# Patient Record
Sex: Male | Born: 1945
Health system: Southern US, Community
[De-identification: ages and names within clinical notes are randomized; demographics above are authoritative.]

## PROBLEM LIST (undated history)

## (undated) DIAGNOSIS — R7303 Prediabetes: Secondary | ICD-10-CM

## (undated) DIAGNOSIS — I639 Cerebral infarction, unspecified: Secondary | ICD-10-CM

## (undated) DIAGNOSIS — G4730 Sleep apnea, unspecified: Secondary | ICD-10-CM

## (undated) DIAGNOSIS — I10 Essential (primary) hypertension: Secondary | ICD-10-CM

## (undated) DIAGNOSIS — H9190 Unspecified hearing loss, unspecified ear: Secondary | ICD-10-CM

## (undated) DIAGNOSIS — M1990 Unspecified osteoarthritis, unspecified site: Secondary | ICD-10-CM

## (undated) DIAGNOSIS — F32A Depression, unspecified: Secondary | ICD-10-CM

## (undated) DIAGNOSIS — C801 Malignant (primary) neoplasm, unspecified: Secondary | ICD-10-CM

## (undated) DIAGNOSIS — J189 Pneumonia, unspecified organism: Secondary | ICD-10-CM

## (undated) DIAGNOSIS — Q282 Arteriovenous malformation of cerebral vessels: Secondary | ICD-10-CM

## (undated) DIAGNOSIS — E119 Type 2 diabetes mellitus without complications: Secondary | ICD-10-CM

## (undated) DIAGNOSIS — R0600 Dyspnea, unspecified: Secondary | ICD-10-CM

## (undated) DIAGNOSIS — R569 Unspecified convulsions: Secondary | ICD-10-CM

## (undated) DIAGNOSIS — F419 Anxiety disorder, unspecified: Secondary | ICD-10-CM

## (undated) DIAGNOSIS — E785 Hyperlipidemia, unspecified: Secondary | ICD-10-CM

## (undated) HISTORY — PX: INGUINAL HERNIA REPAIR: SUR1180

## (undated) HISTORY — DX: Type 2 diabetes mellitus without complications: E11.9

## (undated) HISTORY — PX: COLONOSCOPY W/ POLYPECTOMY: SHX1380

## (undated) HISTORY — PX: HAMMER TOE SURGERY: SHX385

## (undated) HISTORY — PX: BACK SURGERY: SHX140

## (undated) NOTE — *Deleted (*Deleted)
patient

---

## 1997-05-05 HISTORY — PX: BACK SURGERY: SHX140

## 2013-05-05 HISTORY — PX: KNEE SURGERY: SHX244

## 2014-08-27 ENCOUNTER — Emergency Department
Admit: 2014-08-27 | Disposition: A | Discharge status: Home / Self Care | Payer: Self-pay | Admitting: Physician Assistant

## 2014-08-27 LAB — BASIC METABOLIC PANEL
ANION GAP: 7 (ref 7–16)
BUN: 14 mg/dL
CHLORIDE: 102 mmol/L
CREATININE: 0.95 mg/dL
Calcium, Total: 8.9 mg/dL
Co2: 32 mmol/L
EGFR (African American): >60
EGFR (Non-African Amer.): >60
Glucose: 112 mg/dL — ABNORMAL HIGH
Potassium: 3 mmol/L — ABNORMAL LOW
Sodium: 141 mmol/L

## 2014-08-27 LAB — CBC
HCT: 42.8 % (ref 40.0–52.0)
HGB: 14.6 g/dL (ref 13.0–18.0)
MCH: 29.4 pg (ref 26.0–34.0)
MCHC: 34 g/dL (ref 32.0–36.0)
MCV: 86 fL (ref 80–100)
PLATELETS: 214 10*3/uL (ref 150–440)
RBC: 4.96 10*6/uL (ref 4.40–5.90)
RDW: 14.5 % (ref 11.5–14.5)
WBC: 4.9 10*3/uL (ref 3.8–10.6)

## 2014-08-27 LAB — TROPONIN I: Troponin-I: <0.03 ng/mL

## 2014-08-27 LAB — PRO B NATRIURETIC PEPTIDE: B-Type Natriuretic Peptide: 169 pg/mL — ABNORMAL HIGH

## 2015-05-06 HISTORY — PX: TOE SURGERY: SHX1073

## 2015-11-03 DIAGNOSIS — I639 Cerebral infarction, unspecified: Secondary | ICD-10-CM

## 2015-11-03 HISTORY — DX: Cerebral infarction, unspecified: I63.9

## 2015-11-13 ENCOUNTER — Inpatient Hospital Stay (HOSPITAL_COMMUNITY): Payer: Medicare HMO

## 2015-11-13 ENCOUNTER — Emergency Department
Admission: EM | Admit: 2015-11-13 | Discharge: 2015-11-13 | Disposition: A | Discharge status: Other Acute Inpatient Hospital | Payer: Medicare HMO | Attending: Emergency Medicine | Admitting: Emergency Medicine

## 2015-11-13 ENCOUNTER — Inpatient Hospital Stay (HOSPITAL_COMMUNITY)
Admission: AD | Admit: 2015-11-13 | Discharge: 2015-11-16 | DRG: 066 | Disposition: A | Discharge status: Home / Self Care | Payer: Medicare HMO | Source: Other Acute Inpatient Hospital | Attending: Neurology | Admitting: Neurology

## 2015-11-13 ENCOUNTER — Emergency Department: Payer: Medicare HMO

## 2015-11-13 ENCOUNTER — Encounter: Payer: Self-pay | Admitting: Emergency Medicine

## 2015-11-13 DIAGNOSIS — Z833 Family history of diabetes mellitus: Secondary | ICD-10-CM

## 2015-11-13 DIAGNOSIS — E876 Hypokalemia: Secondary | ICD-10-CM | POA: Diagnosis not present

## 2015-11-13 DIAGNOSIS — Z8249 Family history of ischemic heart disease and other diseases of the circulatory system: Secondary | ICD-10-CM | POA: Diagnosis not present

## 2015-11-13 DIAGNOSIS — I10 Essential (primary) hypertension: Secondary | ICD-10-CM | POA: Insufficient documentation

## 2015-11-13 DIAGNOSIS — I619 Nontraumatic intracerebral hemorrhage, unspecified: Secondary | ICD-10-CM | POA: Diagnosis not present

## 2015-11-13 DIAGNOSIS — Z7982 Long term (current) use of aspirin: Secondary | ICD-10-CM

## 2015-11-13 DIAGNOSIS — Z79899 Other long term (current) drug therapy: Secondary | ICD-10-CM | POA: Insufficient documentation

## 2015-11-13 DIAGNOSIS — R7303 Prediabetes: Secondary | ICD-10-CM | POA: Diagnosis not present

## 2015-11-13 DIAGNOSIS — E785 Hyperlipidemia, unspecified: Secondary | ICD-10-CM | POA: Diagnosis not present

## 2015-11-13 DIAGNOSIS — I610 Nontraumatic intracerebral hemorrhage in hemisphere, subcortical: Secondary | ICD-10-CM | POA: Diagnosis not present

## 2015-11-13 DIAGNOSIS — I671 Cerebral aneurysm, nonruptured: Secondary | ICD-10-CM | POA: Diagnosis not present

## 2015-11-13 DIAGNOSIS — I608 Other nontraumatic subarachnoid hemorrhage: Secondary | ICD-10-CM | POA: Diagnosis present

## 2015-11-13 DIAGNOSIS — Z6835 Body mass index (BMI) 35.0-35.9, adult: Secondary | ICD-10-CM | POA: Diagnosis not present

## 2015-11-13 DIAGNOSIS — R471 Dysarthria and anarthria: Secondary | ICD-10-CM | POA: Diagnosis not present

## 2015-11-13 DIAGNOSIS — I611 Nontraumatic intracerebral hemorrhage in hemisphere, cortical: Principal | ICD-10-CM | POA: Diagnosis present

## 2015-11-13 DIAGNOSIS — R4701 Aphasia: Secondary | ICD-10-CM | POA: Diagnosis present

## 2015-11-13 DIAGNOSIS — I770 Arteriovenous fistula, acquired: Secondary | ICD-10-CM

## 2015-11-13 DIAGNOSIS — I6789 Other cerebrovascular disease: Secondary | ICD-10-CM | POA: Diagnosis not present

## 2015-11-13 DIAGNOSIS — E669 Obesity, unspecified: Secondary | ICD-10-CM | POA: Diagnosis not present

## 2015-11-13 DIAGNOSIS — Q2730 Arteriovenous malformation, site unspecified: Secondary | ICD-10-CM

## 2015-11-13 DIAGNOSIS — I639 Cerebral infarction, unspecified: Secondary | ICD-10-CM | POA: Diagnosis present

## 2015-11-13 HISTORY — DX: Hyperlipidemia, unspecified: E78.5

## 2015-11-13 HISTORY — DX: Prediabetes: R73.03

## 2015-11-13 HISTORY — DX: Essential (primary) hypertension: I10

## 2015-11-13 LAB — URINALYSIS COMPLETE WITH MICROSCOPIC (ARMC ONLY)
BACTERIA UA: NONE SEEN
Bilirubin Urine: NEGATIVE
Glucose, UA: NEGATIVE mg/dL
KETONES UR: NEGATIVE mg/dL
LEUKOCYTES UA: NEGATIVE
NITRITE: NEGATIVE
PH: 5 (ref 5.0–8.0)
PROTEIN: NEGATIVE mg/dL
SPECIFIC GRAVITY, URINE: 1.017 (ref 1.005–1.030)

## 2015-11-13 LAB — URINE DRUG SCREEN, QUALITATIVE (ARMC ONLY)
AMPHETAMINES, UR SCREEN: NOT DETECTED
Barbiturates, Ur Screen: NOT DETECTED
Benzodiazepine, Ur Scrn: NOT DETECTED
CANNABINOID 50 NG, UR ~~LOC~~: NOT DETECTED
Cocaine Metabolite,Ur ~~LOC~~: NOT DETECTED
MDMA (Ecstasy)Ur Screen: NOT DETECTED
Methadone Scn, Ur: NOT DETECTED
OPIATE, UR SCREEN: NOT DETECTED
Phencyclidine (PCP) Ur S: NOT DETECTED
Tricyclic, Ur Screen: NOT DETECTED

## 2015-11-13 LAB — CBC WITH DIFFERENTIAL/PLATELET
BASOS ABS: 0 10*3/uL (ref 0–0.1)
Basophils Relative: 1 %
Eosinophils Absolute: 0.2 10*3/uL (ref 0–0.7)
Eosinophils Relative: 4 %
HEMATOCRIT: 45.4 % (ref 40.0–52.0)
HEMOGLOBIN: 16 g/dL (ref 13.0–18.0)
LYMPHS PCT: 24 %
Lymphs Abs: 1.4 10*3/uL (ref 1.0–3.6)
MCH: 31.4 pg (ref 26.0–34.0)
MCHC: 35.2 g/dL (ref 32.0–36.0)
MCV: 89 fL (ref 80.0–100.0)
MONO ABS: 0.7 10*3/uL (ref 0.2–1.0)
Monocytes Relative: 12 %
NEUTROS ABS: 3.7 10*3/uL (ref 1.4–6.5)
Neutrophils Relative %: 59 %
Platelets: 205 10*3/uL (ref 150–440)
RBC: 5.1 MIL/uL (ref 4.40–5.90)
RDW: 13.7 % (ref 11.5–14.5)
WBC: 6.1 10*3/uL (ref 3.8–10.6)

## 2015-11-13 LAB — COMPREHENSIVE METABOLIC PANEL
ALT: 25 U/L (ref 17–63)
ANION GAP: 9 (ref 5–15)
AST: 24 U/L (ref 15–41)
Albumin: 4.4 g/dL (ref 3.5–5.0)
Alkaline Phosphatase: 39 U/L (ref 38–126)
BILIRUBIN TOTAL: 0.4 mg/dL (ref 0.3–1.2)
BUN: 20 mg/dL (ref 6–20)
CO2: 25 mmol/L (ref 22–32)
Calcium: 9.4 mg/dL (ref 8.9–10.3)
Chloride: 103 mmol/L (ref 101–111)
Creatinine, Ser: 1.43 mg/dL — ABNORMAL HIGH (ref 0.61–1.24)
GFR calc Af Amer: 56 mL/min — ABNORMAL LOW (ref >60)
GFR calc non Af Amer: 48 mL/min — ABNORMAL LOW (ref >60)
Glucose, Bld: 148 mg/dL — ABNORMAL HIGH (ref 65–99)
POTASSIUM: 3.4 mmol/L — AB (ref 3.5–5.1)
Sodium: 137 mmol/L (ref 135–145)
Total Protein: 7.5 g/dL (ref 6.5–8.1)

## 2015-11-13 LAB — APTT: aPTT: 28 seconds (ref 24–36)

## 2015-11-13 LAB — TROPONIN I: Troponin I: <0.03 ng/mL (ref <0.03)

## 2015-11-13 LAB — PROTIME-INR
INR: 0.93
Prothrombin Time: 12.7 seconds (ref 11.4–15.0)

## 2015-11-13 LAB — MRSA PCR SCREENING: MRSA by PCR: NEGATIVE

## 2015-11-13 MED ORDER — SENNOSIDES-DOCUSATE SODIUM 8.6-50 MG PO TABS: 1.0000 | ORAL_TABLET | Freq: Every evening | ORAL | Status: DC | PRN

## 2015-11-13 MED ORDER — STROKE: EARLY STAGES OF RECOVERY BOOK
Freq: Once | Status: DC
  Filled 2015-11-13: qty 1

## 2015-11-13 MED ORDER — SODIUM CHLORIDE 0.9 % IV SOLN
INTRAVENOUS | Status: DC
  Administered 2015-11-13: 18:00:00 via INTRAVENOUS

## 2015-11-13 MED ORDER — LABETALOL HCL 5 MG/ML IV SOLN: 20.0000 mg | Freq: Once | INTRAVENOUS | Status: DC

## 2015-11-13 MED ORDER — LABETALOL HCL 5 MG/ML IV SOLN
10.0000 mg | Freq: Once | INTRAVENOUS | Status: AC
  Administered 2015-11-13: 10 mg via INTRAVENOUS
  Filled 2015-11-13: qty 4

## 2015-11-13 NOTE — ED Provider Notes (Signed)
[facility] Emergency Department Provider Note   ____________________________________________    I have reviewed the triage vital signs and the nursing notes.   HISTORY  Chief Complaint Aphasia     HPI Timothy Maddox is a 70 y.o. male who presents with difficulty speaking. Patient reports yesterday at approximately 9 PM he noticed the tip of his tongue was known and he had difficulty speaking and difficulty finding words. He continues to have difficulty speaking today, when his daughter came to his house she immediately became concerned and his something was wrong and called EMS. No history of CVA. No head injury. No extremity weakness   Past Medical History  Diagnosis Date  . Hypertension   . Prediabetes   . Hyperlipidemia     There are no active problems to display for this patient.   Past Surgical History  Procedure Laterality Date  . Back surgery    . Toe surgery Left     Current Outpatient Rx  Name  Route  Sig  Dispense  Refill  . amLODipine (NORVASC) 10 MG tablet   Oral   Take 10 mg by mouth daily.         Marland Kitchen aspirin EC 81 MG tablet   Oral   Take 81 mg by mouth daily.         Marland Kitchen losartan-hydrochlorothiazide (HYZAAR) 100-12.5 MG tablet   Oral   Take 1 tablet by mouth daily.         . metoprolol (LOPRESSOR) 100 MG tablet   Oral   Take 100 mg by mouth 2 (two) times daily.         . rosuvastatin (CRESTOR) 10 MG tablet   Oral   Take 10 mg by mouth daily.         Marland Kitchen spironolactone (ALDACTONE) 100 MG tablet   Oral   Take 100 mg by mouth 2 (two) times daily.           Allergies Review of patient's allergies indicates no known allergies.  Family History  Problem Relation Age of Onset  . Heart failure Father   . Diabetes Father   . Hypertension Father     Social History Social History  Substance Use Topics  . Smoking status: Never Smoker   . Smokeless tobacco: None  . Alcohol Use: No    Review of  Systems  Constitutional: No fever/chills Eyes: No visual changes.  ENT: No Neck pain Cardiovascular: Denies chest pain. Respiratory: Denies shortness of breath. Gastrointestinal:  No nausea, no vomiting.   Genitourinary: Negative for continence Musculoskeletal: Negative for back pain. Skin: Negative for rash. Neurological: Negative for extremity weakness  10-point ROS otherwise negative.  ____________________________________________   PHYSICAL EXAM:  VITAL SIGNS: ED Triage Vitals  Enc Vitals Group     BP 11/13/15 1321 154/94 mmHg     Pulse Rate 11/13/15 1321 56     Resp 11/13/15 1321 19     Temp 11/13/15 1321 98.1 F (36.7 C)     Temp Source 11/13/15 1321 Oral     SpO2 11/13/15 1321 96 %     Weight 11/13/15 1321 260 lb (117.935 kg)     Height 11/13/15 1321 6' (1.829 m)     Head Cir --      Peak Flow --      Pain Score --      Pain Loc --      Pain Edu? --  Excl. in [place]? --     Constitutional: Alert and oriented. No acute distress. Eyes: Conjunctivae are normal. PERRLA, EOMI Head: Atraumatic.Normocephalic Nose: No congestion/rhinnorhea. Mouth/Throat: Mucous membranes are moist.  Oropharynx non-erythematous. Neck:  Painless ROM Cardiovascular: Normal rate, regular rhythm. Grossly normal heart sounds.  Good peripheral circulation. Respiratory: Normal respiratory effort.  No retractions. Lungs CTAB. Gastrointestinal: Soft and nontender. No distention.  Genitourinary: deferred Musculoskeletal: No lower extremity tenderness nor edema.  Warm and well perfused, normal strength all extremities Neurologic:  Patient with slurred speech and some difficulty with word finding, extremity strength is normal. NIH stroke scale of 2 on my exam Skin:  Skin is warm, dry and intact. No rash noted. Psychiatric: Mood and affect are normal.  behavior is normal.  ____________________________________________   LABS (all labs ordered are listed, but only abnormal results are  displayed)  Labs Reviewed  COMPREHENSIVE METABOLIC PANEL - Abnormal; Notable for the following:    Potassium 3.4 (*)    Glucose, Bld 148 (*)    Creatinine, Ser 1.43 (*)    GFR calc non Af Amer 48 (*)    GFR calc Af Amer 56 (*)    All other components within normal limits  URINALYSIS COMPLETEWITH MICROSCOPIC ([facility] ONLY) - Abnormal; Notable for the following:    Color, Urine YELLOW (*)    APPearance CLEAR (*)    Hgb urine dipstick 1+ (*)    Squamous Epithelial / LPF 0-5 (*)    All other components within normal limits  APTT  CBC WITH DIFFERENTIAL/PLATELET  TROPONIN I  PROTIME-INR  URINE DRUG SCREEN, QUALITATIVE ([facility] ONLY)   ____________________________________________  EKG  ED ECG REPORT I, Lavonia Drafts, the attending physician, personally viewed and interpreted this ECG.  Date: 11/13/2015 EKG Time: 1:16 PM Rate: 59 Rhythm: normal sinus rhythm QRS Axis: normal Intervals: normal ST/T Wave abnormalities: normal Conduction Disturbances: none   ____________________________________________  RADIOLOGY  Patient with focal area of hemorrhage in the left frontal lobe ____________________________________________   PROCEDURES  Procedure(s) performed: No    Critical Care performed:yes  CRITICAL CARE Performed by: Lavonia Drafts   Total critical care time: 35 minutes  Critical care time was exclusive of separately billable procedures and treating other patients.  Critical care was necessary to treat or prevent imminent or life-threatening deterioration.  Critical care was time spent personally by me on the following activities: development of treatment plan with patient and/or surrogate as well as nursing, discussions with consultants, evaluation of patient's response to treatment, examination of patient, obtaining history from patient or surrogate, ordering and performing treatments and interventions, ordering and review of laboratory studies, ordering and  review of radiographic studies, pulse oximetry and re-evaluation of patient's condition.  ____________________________________________   INITIAL IMPRESSION / ASSESSMENT AND PLAN / ED COURSE  Pertinent labs & imaging results that were available during my care of the patient were reviewed by me and considered in my medical decision making (see chart for details).  Patient presents with slurred speech which apparently started at 9 PM last night. He does not fall within the window for a code stroke but certainly after his exam and history of present illness and concerned about a CVA, we will obtain CT scan labs ASAP. NIH stroke scale of 2  ----------------------------------------- 2:46 PM on 11/13/2015 -----------------------------------------  Called by radiologist and notified of likely hemorrhagic infarct on CT scan. Discussed with Dr. Gifford Shave at [facility] who accepts the patient in transfer. Patient and family are aware of the  plan  I will give a small dose of labetalol to gently lower the patient's blood pressure. Patient stable at this time with no evidence of increased ICP ____________________________________________   FINAL CLINICAL IMPRESSION(S) / ED DIAGNOSES  Final diagnoses:  Hemorrhagic stroke ([place])      NEW MEDICATIONS STARTED DURING THIS VISIT:  New Prescriptions   No medications on file     Note:  This document was prepared using Dragon voice recognition software and may include unintentional dictation errors.    Lavonia Drafts, MD 11/13/15 [phone]

## 2015-11-13 NOTE — ED Notes (Signed)
Signature pad not working.  Pt verbalized consent for transfer to [facility] and has no further questions.

## 2015-11-13 NOTE — ED Notes (Signed)
Pt to ED via EMS from home c/o speech difficulty.  Pt states yesterday noticed tip of tongue became numb and developed trouble speaking and trouble finding words.  Pt states today tongue numbness has resolved.  Pt denies any other symptoms.  Pt is A&Ox4, ambulatory to room with steady gait and NAD at this time.  EMS vitals 132/72, 78 HR, 160 CBG, 98% RA.

## 2015-11-13 NOTE — Consult Note (Signed)
Requesting Physician: Dr. Corky Downs    Chief Complaint: stroke  History obtained from:  Patient   HPI:                                                                                                                                         Timothy Maddox is an 70 y.o. male presenting to [facility] ED with speech difficulties.  He states he developed numbness in his mouth and garbled speech last evening.  His family states they initially were unable to understand anything he said but it cleared somewhat so they could understand some words.  He states he initially had difficulty finding his words but that resolved.  He did complain of a headache with the onset of symptoms.  He refused to be evaluated at that time.  This am his daughter called to speak with him and found him to have garbled speech again and insisted he be evaluated.  He states his speech has cleared and his family states he has made significant improvement since earlier this morning.  He has a history of HTN and HLD for which he takes his medicines regularly.  Date last known well: 11/12/2015 Time last known well: Time: 21:00 tPA Given: No: outside window, contraindicated No Symptoms         0 No significant disability/able to carry out all usual activities   1 Unable to carry out all previous activities but looks after own affairs             2 Requires help but walks without assistance     3 Unable to walk without assistance/unable to handle own bodily needs 4 Bedridden/incontinent        5 Dead          6  Modified Rankin: Rankin Score=0  Intracerebral Hemorrhage (ICH) Score  Glascow Coma Score   0  Age >/= 80 yes no 0  ICH volume >/= 60ml no 0  IVH yes no 0  Infratentorial origin no 0 Total:  0  Past Medical History  Diagnosis Date  . Hypertension   . Prediabetes   . Hyperlipidemia     Past Surgical History  Procedure Laterality Date  . Back surgery    . Toe surgery Left     Family History  Problem  Relation Age of Onset  . Heart failure Father   . Diabetes Father   . Hypertension Father    Social History:  reports that he has never smoked. He does not have any smokeless tobacco history on file. He reports that he does not drink alcohol or use illicit drugs.  Allergies: No Known Allergies  Medications:  Current Facility-Administered Medications  Medication Dose Route Frequency Provider Last Rate Last Dose  .  stroke: mapping our early stages of recovery book   Does not apply Once Zulfiqar Robyne Askew, MD      . 0.9 %  sodium chloride infusion   Intravenous Continuous Zulfiqar Robyne Askew, MD      . senna-docusate (Senokot-S) tablet 1 tablet  1 tablet Oral QHS PRN Zulfiqar Robyne Askew, MD      }  ROS:                                                                                                                                       History obtained from the patient  General ROS: negative for - chills, fatigue, fever, night sweats, weight gain or weight loss Psychological ROS: negative for - behavioral disorder, hallucinations, memory difficulties, mood swings or suicidal ideation Ophthalmic ROS: negative for - blurry vision, double vision, eye pain or loss of vision ENT ROS: negative for - epistaxis, nasal discharge, oral lesions, sore throat, tinnitus or vertigo Allergy and Immunology ROS: negative for - hives or itchy/watery eyes Hematological and Lymphatic ROS: negative for - bleeding problems, bruising or swollen lymph nodes Endocrine ROS: negative for - galactorrhea, hair pattern changes, polydipsia/polyuria or temperature intolerance Respiratory ROS: negative for - cough, hemoptysis, shortness of breath or wheezing Cardiovascular ROS: negative for - chest pain, dyspnea on exertion, edema or irregular heartbeat Gastrointestinal ROS: negative for - abdominal pain,  diarrhea, hematemesis, nausea/vomiting or stool incontinence Genito-Urinary ROS: negative for - dysuria, hematuria, incontinence or urinary frequency/urgency Musculoskeletal ROS: negative for - joint swelling or muscular weakness Neurological ROS: as noted in HPI Dermatological ROS: negative for rash and skin lesion changes  Neurologic Examination:                                                                                                      There were no vitals taken for this visit.  HEENT-  Normocephalic, no lesions, without obvious abnormality.  Normal external eye and conjunctiva.  Normal external ears. Normal external nose.  Normal pharynx. Full set of dentures Cardiovascular- S1, S2 normal, pulses palpable throughout   Lungs- chest clear, no wheezing, rales, normal symmetric air entry Abdomen- rounded, soft, non-tender; bowel sounds normal; no masses,  no organomegaly Extremities- less then 2 second capillary refill and no edema Lymph-no adenopathy palpable Musculoskeletal-no joint tenderness, deformity or swelling Skin-dry  Neurological Examination Mental Status: Alert, oriented, thought content appropriate.  Speech dysarthric without evidence of aphasia.  Able to follow 3 step commands without difficulty. Cranial Nerves: II:  Visual fields grossly normal, pupils equal, round, reactive to light and accommodation III,IV, VI: ptosis not present, extra-ocular motions intact bilaterally V,VII: smile symmetric, facial light touch sensation normal bilaterally VIII: hearing normal bilaterally IX,X: uvula rises symmetrically XI: bilateral shoulder shrug XII: midline tongue extension Motor: Right : Upper extremity   5/5    Left:     Upper extremity   5/5  Lower extremity   5/5     Lower extremity   5/5 Tone and bulk:normal tone throughout; no atrophy noted Sensory: Pinprick and light touch intact throughout, bilaterally Deep Tendon Reflexes: 2+ and symmetric  throughout Plantars: Right: downgoing   Left: downgoing Cerebellar: normal finger-to-nose,normal heel-to-shin test Gait: not tested    Lab Results: Basic Metabolic Panel:  Recent Labs Lab 11/13/15 1330  NA 137  K 3.4*  CL 103  CO2 25  GLUCOSE 148*  BUN 20  CREATININE 1.43*  CALCIUM 9.4    Liver Function Tests:  Recent Labs Lab 11/13/15 1330  AST 24  ALT 25  ALKPHOS 39  BILITOT 0.4  PROT 7.5  ALBUMIN 4.4   No results for input(s): LIPASE, AMYLASE in the last 168 hours. No results for input(s): AMMONIA in the last 168 hours.  CBC:  Recent Labs Lab 11/13/15 1330  WBC 6.1  NEUTROABS 3.7  HGB 16.0  HCT 45.4  MCV 89.0  PLT 205    Cardiac Enzymes:  Recent Labs Lab 11/13/15 1330  TROPONINI <0.03    Lipid Panel: No results for input(s): CHOL, TRIG, HDL, CHOLHDL, VLDL, LDLCALC in the last 168 hours.  CBG: No results for input(s): GLUCAP in the last 168 hours.  Microbiology: No results found for this or any previous visit.  Coagulation Studies:  Recent Labs  11/13/15 1330  LABPROT 12.7  INR 0.93    Imaging: Ct Head Wo Contrast  11/13/2015  CLINICAL DATA:  Dysarthria EXAM: CT HEAD WITHOUT CONTRAST TECHNIQUE: Contiguous axial images were obtained from the base of the skull through the vertex without intravenous contrast. COMPARISON:  October 23, 2004 FINDINGS: Brain: There is ventricular enlargement with sulci normal, a stable finding. There is a focal area of hemorrhage in the posterior left frontal lobe measuring 1.6 x 1.0 cm no other hemorrhage is seen. There is no well-defined mass. There is no extra-axial fluid or midline shift. There is mild small vessel disease in the centra semiovale bilaterally. Vascular: There is no hyperdense vessel. There are foci of arterial vascular calcification cavernous carotid arteries bilaterally. Skull: The bony calvarium appears intact. Sinuses/Orbits: The visualized paranasal sinuses are clear. No intraorbital  lesions are evident in the visualized orbital regions. Other: Visualized mastoid air cells are clear. IMPRESSION: Focal area of hemorrhage in the left frontal lobe. Suspect focal hemorrhagic infarct. There is mild periventricular small vessel disease, stable. There is generalized ventricular enlargement with normal appearing sulci, a chronic finding. Question a degree of normal pressure hydrocephalus. Critical Value/emergent results were called by telephone at the time of interpretation on 11/13/2015 at 2:25 pm to Dr. Lavonia Drafts , who verbally acknowledged these results. Electronically Signed   By: Lowella Grip III M.D.   On: 11/13/2015 14:25      11/13/2015, 5:37 PM   Assessment: 70 y.o. male with history of HTN and HLD that developed numbness of his mouth and garbled speech last evening.   CTA head showed small area  of bleed and left frontal region probably suggesting an ischemic stroke with hemorrhagic conversion.  Stroke Risk Factors - hypertension  We will continue his statin therapy.    Recommendations 1. HgbA1c, fasting lipid panel 2. MRI, MRA of the brain and neck without contrast 3. PT consult, OT consult, Speech consult 4. Echocardiogram 6. Prophylactic therapy-Antiplatelet med: Aspirin - dose 325mg  po daily, pending MRI results 7. Risk factor modification 8. Telemetry monitoring 9. Frequent neuro checks 10 swallow screen done passed 11 please page stroke NP Or PA Or MD from 8am -4 pm as this patient from this time will be followed by the stroke. You can look them up on [email or web address] Password [email or web address]

## 2015-11-14 ENCOUNTER — Inpatient Hospital Stay (HOSPITAL_COMMUNITY): Payer: Medicare HMO

## 2015-11-14 ENCOUNTER — Encounter (HOSPITAL_COMMUNITY): Payer: Self-pay | Admitting: Radiology

## 2015-11-14 DIAGNOSIS — I610 Nontraumatic intracerebral hemorrhage in hemisphere, subcortical: Secondary | ICD-10-CM

## 2015-11-14 DIAGNOSIS — I6789 Other cerebrovascular disease: Secondary | ICD-10-CM

## 2015-11-14 LAB — ECHOCARDIOGRAM COMPLETE
AOASC: 37 cm
AVLVOTPG: 7 mmHg
CHL CUP MV DEC (S): 394
EWDT: 394 ms
FS: 29 % (ref 28–44)
HEIGHTINCHES: 72 in
IVS/LV PW RATIO, ED: 1.31
LA diam end sys: 41 mm
LA vol A4C: 60 ml
LADIAMINDEX: 1.72 cm/m2
LASIZE: 41 mm
LAVOL: 64.5 mL
LAVOLIN: 27.1 mL/m2
LV dias vol: 62 mL (ref 62–150)
LVDIAVOLIN: 26 mL/m2
LVELAT: 11.3 cm/s
LVOT SV: 111 mL
LVOT VTI: 32.2 cm
LVOT area: 3.46 cm2
LVOTD: 21 mm
LVOTPV: 131 cm/s
LVSYSVOL: 26 mL (ref 21–61)
LVSYSVOLIN: 11 mL/m2
MV pk E vel: 0.9 m/s
PW: 13 mm — AB (ref 0.6–1.1)
RV TAPSE: 18.8 mm
Simpson's disk: 58
Stroke v: 36 ml
TDI e' lateral: 11.3
TDI e' medial: 4.9
WEIGHTICAEL: 4158.76 [oz_av]

## 2015-11-14 LAB — LIPID PANEL
CHOLESTEROL: 101 mg/dL (ref 0–200)
HDL: 28 mg/dL — AB (ref >40)
LDL Cholesterol: 43 mg/dL (ref 0–99)
TRIGLYCERIDES: 148 mg/dL (ref <150)
Total CHOL/HDL Ratio: 3.6 RATIO
VLDL: 30 mg/dL (ref 0–40)

## 2015-11-14 MED ORDER — ROSUVASTATIN CALCIUM 10 MG PO TABS
10.0000 mg | ORAL_TABLET | Freq: Every day | ORAL | Status: DC
  Administered 2015-11-14 – 2015-11-16 (×3): 10 mg via ORAL
  Filled 2015-11-14 (×3): qty 1

## 2015-11-14 MED ORDER — AMLODIPINE BESYLATE 10 MG PO TABS
10.0000 mg | ORAL_TABLET | Freq: Every day | ORAL | Status: DC
  Administered 2015-11-14 – 2015-11-16 (×3): 10 mg via ORAL
  Filled 2015-11-14 (×3): qty 1

## 2015-11-14 MED ORDER — METOPROLOL TARTRATE 100 MG PO TABS
100.0000 mg | ORAL_TABLET | Freq: Two times a day (BID) | ORAL | Status: DC
  Administered 2015-11-14 – 2015-11-15 (×3): 100 mg via ORAL
  Filled 2015-11-14 (×4): qty 1

## 2015-11-14 MED ORDER — ACETAMINOPHEN 325 MG PO TABS
650.0000 mg | ORAL_TABLET | Freq: Four times a day (QID) | ORAL | Status: DC | PRN
  Administered 2015-11-14 – 2015-11-16 (×4): 650 mg via ORAL
  Filled 2015-11-14 (×4): qty 2

## 2015-11-14 MED ORDER — LOSARTAN POTASSIUM 50 MG PO TABS
100.0000 mg | ORAL_TABLET | Freq: Every day | ORAL | Status: DC
  Administered 2015-11-14 – 2015-11-16 (×3): 100 mg via ORAL
  Filled 2015-11-14 (×3): qty 2

## 2015-11-14 MED ORDER — HYDROCHLOROTHIAZIDE 12.5 MG PO CAPS
12.5000 mg | ORAL_CAPSULE | Freq: Every day | ORAL | Status: DC
  Administered 2015-11-14 – 2015-11-16 (×3): 12.5 mg via ORAL
  Filled 2015-11-14 (×3): qty 1

## 2015-11-14 MED ORDER — LOSARTAN POTASSIUM-HCTZ 100-12.5 MG PO TABS: 1.0000 | ORAL_TABLET | Freq: Every day | ORAL | Status: DC

## 2015-11-14 MED ORDER — SPIRONOLACTONE 50 MG PO TABS
100.0000 mg | ORAL_TABLET | Freq: Two times a day (BID) | ORAL | Status: DC
  Administered 2015-11-14 – 2015-11-16 (×5): 100 mg via ORAL
  Filled 2015-11-14 (×5): qty 2

## 2015-11-14 MED ORDER — PERFLUTREN LIPID MICROSPHERE
1.0000 mL | INTRAVENOUS | Status: AC | PRN
  Administered 2015-11-14: 2 mL via INTRAVENOUS
  Filled 2015-11-14: qty 10

## 2015-11-14 MED ORDER — IOPAMIDOL (ISOVUE-370) INJECTION 76%
INTRAVENOUS | Status: AC
  Administered 2015-11-14: 50 mL
  Filled 2015-11-14: qty 50

## 2015-11-14 NOTE — Progress Notes (Signed)
OT Cancellation Note  Patient Details Name: Timothy Maddox MRN: [id number] DOB: 1946-03-10   Cancelled Treatment:    Reason Eval/Treat Not Completed: Patient at procedure or test/ unavailable - Pt going for CT.  Will try back as schedule allows   Darlina Rumpf [place], OTR/L [id number]  11/14/2015, 11:26 AM

## 2015-11-14 NOTE — Progress Notes (Signed)
  Echocardiogram 2D Echocardiogram with Definity has been performed.  Bobbye Charleston 11/14/2015, 4:02 PM

## 2015-11-14 NOTE — Evaluation (Signed)
Speech Language Pathology Evaluation Patient Details Name: Timothy Maddox MRN: [id number] DOB: 10/03/1945 Today's Date: 11/14/2015 Time: [id number] SLP Time Calculation (min) (ACUTE ONLY): 25 min  Problem List:  Patient Active Problem List   Diagnosis Date Noted  . Stroke [facility]) 11/13/2015   Past Medical History:  Past Medical History  Diagnosis Date  . Hypertension   . Prediabetes   . Hyperlipidemia    Past Surgical History:  Past Surgical History  Procedure Laterality Date  . Back surgery    . Toe surgery Left    HPI:  70 y.o. male with history of HTN and HLD that developed numbness of his mouth and garbled speech. CT showed focal area of hemorrhage in the left frontal lobe. MRI without evidence of surrounding infarct.   Assessment / Plan / Recommendation Clinical Impression  Pt has adequate intellectual and emergent awareness of acute cognitive-linguistic impairments, including decreased selective attention and working memory. Speech is moderately dysarthric at the conversational level with imprecise articulation and fast rate. Evaluation was brief as transport had arrived to take pt to CT. Recommend acute SLP f/u to maximize cognitive and communicative functional skills with continued differential diagnosis of abilities. He will need additional SLP f/u and initial 24/7 supervision upon d/c.    SLP Assessment  Patient needs continued Speech Lanaguage Pathology Services    Follow Up Recommendations  Inpatient Rehab;Outpatient SLP (OP if pt does not qualify for CIR)    Frequency and Duration min 2x/week  2 weeks      SLP Evaluation Prior Functioning  Cognitive/Linguistic Baseline: Within functional limits Type of Home: Apartment  Lives With: Alone   Cognition  Overall Cognitive Status: Impaired/Different from baseline Arousal/Alertness: Awake/alert Orientation Level: Oriented X4 Attention: Selective Selective Attention: Impaired Selective Attention Impairment:  Verbal basic Memory: Impaired Memory Impairment: Other (comment) (working memory) Awareness: Appears intact    Comprehension  Auditory Comprehension Overall Auditory Comprehension: Impaired Commands: Impaired Complex Commands: 50-74% accurate Conversation: Simple Interfering Components: Working Marine scientist;Attention    Expression Expression Primary Mode of Expression: Verbal Verbal Expression Overall Verbal Expression: Appears within functional limits for tasks assessed   Oral / Motor  Motor Speech Overall Motor Speech: Impaired Respiration: Within functional limits Phonation: Low vocal intensity Articulation: Impaired Level of Impairment: Conversation Intelligibility: Intelligibility reduced Conversation: 75-100% accurate   GO                    Germain Osgood, M.A. CCC-SLP [phone]  Germain Osgood 11/14/2015, 11:21 AM

## 2015-11-14 NOTE — Progress Notes (Signed)
STROKE TEAM PROGRESS NOTE   HISTORY OF PRESENT ILLNESS (per record) Timothy Maddox is an 70 y.o. male presenting to [facility] ED with speech difficulties. He states he developed numbness in his mouth and garbled speech last evening. His family states they initially were unable to understand anything he said but it cleared somewhat so they could understand some words. He states he initially had difficulty finding his words but that resolved. He did complain of a headache with the onset of symptoms. He refused to be evaluated at that time. This am his daughter called to speak with him and found him to have garbled speech again and insisted he be evaluated. He states his speech has cleared and his family states he has made significant improvement since earlier this morning. He has a history of HTN and HLD for which he takes his medicines regularly. He was last known well 11/12/2015 at 21:00. Modified Rankin: Rankin Score=0. Intracerebral Hemorrhage (ICH) Score Total: 0. Patient was not administered IV t-PA secondary to being outside the window. He was admitted for further evaluation and treatment.   SUBJECTIVE (INTERVAL HISTORY) His RN is at the bedside.  Overall he feels his condition is stable. He states he feels all right - still problems with his speech. Reports back ache he feels related to laying in the bed. Also complains of HA.   OBJECTIVE Temp:  [97.4 F (36.3 C)-98.3 F (36.8 C)] 97.4 F (36.3 C) (07/12 0700) Pulse Rate:  [49-57] 49 (07/12 0708) Cardiac Rhythm:  [-] Sinus bradycardia (07/12 0800) Resp:  [14-24] 23 (07/12 0708) BP: (108-154)/(56-122) 134/86 mmHg (07/12 0708) SpO2:  [95 %-97 %] 97 % (07/12 0700) Weight:  [117.9 kg (259 lb 14.8 oz)-117.935 kg (260 lb)] 117.9 kg (259 lb 14.8 oz) (07/11 1913)  CBC:   Recent Labs Lab 11/13/15 1330  WBC 6.1  NEUTROABS 3.7  HGB 16.0  HCT 45.4  MCV 89.0  PLT 99991111    Basic Metabolic Panel:   Recent Labs Lab 11/13/15 1330   NA 137  K 3.4*  CL 103  CO2 25  GLUCOSE 148*  BUN 20  CREATININE 1.43*  CALCIUM 9.4    Lipid Panel:     Component Value Date/Time   CHOL 101 11/14/2015 0424   TRIG 148 11/14/2015 0424   HDL 28* 11/14/2015 0424   CHOLHDL 3.6 11/14/2015 0424   VLDL 30 11/14/2015 0424   LDLCALC 43 11/14/2015 0424   HgbA1c: No results found for: HGBA1C Urine Drug Screen:     Component Value Date/Time   LABOPIA NONE DETECTED 11/13/2015 1359   COCAINSCRNUR NONE DETECTED 11/13/2015 1359   LABBENZ NONE DETECTED 11/13/2015 1359   AMPHETMU NONE DETECTED 11/13/2015 1359   THCU NONE DETECTED 11/13/2015 1359   LABBARB NONE DETECTED 11/13/2015 1359      IMAGING  Dg Chest 2 View  11/13/2015  CLINICAL DATA:  Recent stroke EXAM: CHEST  2 VIEW COMPARISON:  08/27/2014 FINDINGS: The heart size and mediastinal contours are within normal limits. Both lungs are clear. The visualized skeletal structures are unremarkable. IMPRESSION: No active cardiopulmonary disease. Electronically Signed   By: Inez Catalina M.D.   On: 11/13/2015 21:30   Ct Head Wo Contrast  11/13/2015  CLINICAL DATA:  Dysarthria EXAM: CT HEAD WITHOUT CONTRAST TECHNIQUE: Contiguous axial images were obtained from the base of the skull through the vertex without intravenous contrast. COMPARISON:  October 23, 2004 FINDINGS: Brain: There is ventricular enlargement with sulci normal, a stable finding. There  is a focal area of hemorrhage in the posterior left frontal lobe measuring 1.6 x 1.0 cm no other hemorrhage is seen. There is no well-defined mass. There is no extra-axial fluid or midline shift. There is mild small vessel disease in the centra semiovale bilaterally. Vascular: There is no hyperdense vessel. There are foci of arterial vascular calcification cavernous carotid arteries bilaterally. Skull: The bony calvarium appears intact. Sinuses/Orbits: The visualized paranasal sinuses are clear. No intraorbital lesions are evident in the visualized  orbital regions. Other: Visualized mastoid air cells are clear. IMPRESSION: Focal area of hemorrhage in the left frontal lobe. Suspect focal hemorrhagic infarct. There is mild periventricular small vessel disease, stable. There is generalized ventricular enlargement with normal appearing sulci, a chronic finding. Question a degree of normal pressure hydrocephalus. Critical Value/emergent results were called by telephone at the time of interpretation on 11/13/2015 at 2:25 pm to Dr. Lavonia Drafts , who verbally acknowledged these results. Electronically Signed   By: Lowella Grip III M.D.   On: 11/13/2015 14:25   Mr Brain Wo Contrast  11/13/2015  CLINICAL DATA:  Stroke.  Intracranial hemorrhage EXAM: MRI HEAD WITHOUT CONTRAST MRA HEAD WITHOUT CONTRAST TECHNIQUE: Multiplanar, multiecho pulse sequences of the brain and surrounding structures were obtained without intravenous contrast. Angiographic images of the head were obtained using MRA technique without contrast. COMPARISON:  CT head 11/13/2015 FINDINGS: MRI HEAD FINDINGS Mild ventricular enlargement is unchanged. This is greater than expected for the degree of atrophy with minimal prominence of the sylvian fissures. Possible normal pressure hydrocephalus 10 x 15 mm hemorrhage in the left posterior frontal lobe unchanged from CT. No associated ischemic infarct in the area. No other areas of intracranial hemorrhage Negative for acute infarct. Mild chronic microvascular ischemic change in the white matter. Basal ganglia and brainstem normal. Negative for mass or edema.  No shift of the midline structures. Paranasal sinuses are clear. MRA HEAD FINDINGS Both vertebral arteries patent to the basilar. PICA patent bilaterally. Basilar patent. Superior cerebellar and posterior cerebral arteries patent bilaterally. Internal carotid artery patent bilaterally without stenosis. Anterior and middle cerebral arteries appear normal bilaterally. Patent posterior communicating  artery bilaterally left greater than right. Negative for cerebral aneurysm. IMPRESSION: 10 x 15 mm acute hemorrhage left frontal lobe unchanged from CT today. No evidence of surrounding infarct. This may be due to hypertension. Amyloid is also a possibility. Ventricular enlargement is unchanged and suggestive of normal pressure hydrocephalus. Mild chronic microvascular ischemic change in the white matter Negative MRA Electronically Signed   By: Franchot Gallo M.D.   On: 11/13/2015 21:14   Mr Jodene Nam Head/brain Wo Cm  11/13/2015  CLINICAL DATA:  Stroke.  Intracranial hemorrhage EXAM: MRI HEAD WITHOUT CONTRAST MRA HEAD WITHOUT CONTRAST TECHNIQUE: Multiplanar, multiecho pulse sequences of the brain and surrounding structures were obtained without intravenous contrast. Angiographic images of the head were obtained using MRA technique without contrast. COMPARISON:  CT head 11/13/2015 FINDINGS: MRI HEAD FINDINGS Mild ventricular enlargement is unchanged. This is greater than expected for the degree of atrophy with minimal prominence of the sylvian fissures. Possible normal pressure hydrocephalus 10 x 15 mm hemorrhage in the left posterior frontal lobe unchanged from CT. No associated ischemic infarct in the area. No other areas of intracranial hemorrhage Negative for acute infarct. Mild chronic microvascular ischemic change in the white matter. Basal ganglia and brainstem normal. Negative for mass or edema.  No shift of the midline structures. Paranasal sinuses are clear. MRA HEAD FINDINGS Both vertebral arteries patent  to the basilar. PICA patent bilaterally. Basilar patent. Superior cerebellar and posterior cerebral arteries patent bilaterally. Internal carotid artery patent bilaterally without stenosis. Anterior and middle cerebral arteries appear normal bilaterally. Patent posterior communicating artery bilaterally left greater than right. Negative for cerebral aneurysm. IMPRESSION: 10 x 15 mm acute hemorrhage left  frontal lobe unchanged from CT today. No evidence of surrounding infarct. This may be due to hypertension. Amyloid is also a possibility. Ventricular enlargement is unchanged and suggestive of normal pressure hydrocephalus. Mild chronic microvascular ischemic change in the white matter Negative MRA Electronically Signed   By: Franchot Gallo M.D.   On: 11/13/2015 21:14     PHYSICAL EXAM  Physical exam: Exam: Gen: NAD, conversant, well nourised, obese, well groomed                     CV: RRR, no MRG. No Carotid Bruits. No peripheral edema, warm, nontender Eyes: Conjunctivae clear without exudates or hemorrhage  Neuro: Detailed Neurologic Exam  Speech:    Speech is dysarthric Cognition:    The patient is oriented to person, place, and time;  Cranial Nerves:    The pupils are equal, round, and reactive to light. The fundi are normal and spontaneous venous pulsations are present. Visual fields are full to finger confrontation. Extraocular movements are intact. Trigeminal sensation is intact and the muscles of mastication are normal. The face is symmetric. The palate elevates in the midline. Hearing intact. Voice is normal. Shoulder shrug is normal. The tongue has normal motion without fasciculations.   Coordination:    Normal finger to nose.  Gait:    Heel-toe and tandem gait are normal.   Motor Observation:    No asymmetry, no atrophy, and no involuntary movements noted. Tone:    Normal muscle tone.    Strength:    Strength is V/V in the upper and lower limbs.      Sensation: intact to LT     Toes:    The toes are downgoing bilaterally.   Clonus:    Clonus is absent.    ASSESSMENT/PLAN Mr. SHAINA BEAUDET is a 70 y.o. male with history of HTN, pre-diabetes, and HLD presenting with speech difficulties. CT shows a L frontal hemorrhage.   Stroke:  Left frontal hemorrhage  secondary to unclear source  Resultant  dysarthria, HA  MRI  L frontal lobe ICH. No infarct. ? NPH.  small vessel disease   Carotid Doppler  pending   2D Echo  pending   CT angio head pending   LDL 43  HgbA1c pending  SCDs for VTE prophylaxis Diet Heart Room service appropriate?: Yes; Fluid consistency:: Thin  aspirin 81 mg daily ordered PTA but was not taking routinely per him. now on No antithrombotic given hemorrhage  Ongoing aggressive stroke risk factor management  Tylenol for HA management  Resume home meds  Therapy recommendations:  No OT  Disposition:  pending   Admitted to stepdown level care, continue x 24h  Hypertension  Stable  Long-term BP goal normotensive  Hyperlipidemia  Home meds:  crestor 10 , resumed in hospital  LDL 43, goal < 70  Continue statin at discharge  Pre-Diabetes  HgbA1c pending , goal < 7.0  Other Stroke Risk Factors  Advanced age  Obesity, Body mass index is 35.24 kg/(m^2)., recommend weight loss, diet and exercise as appropriate   Other Active Problems  Possible poor memory - no reported history. Follow up with family  Hypokalemia  Elevated  creatinine 1.43  Hospital day # 1  Radene Journey [facility] for Pager information 11/14/2015 2:46 PM     Personally examined patient and images, and have participated in and made any corrections needed to history, physical, neuro exam,assessment and plan as stated above.  I have personally obtained the history, evaluated lab date, reviewed imaging studies and agree with radiology interpretations. This is a 70 year old male with acute onset speech difficulties. CT of the head showed left frontal hemorrhage that would be unusual in that location for hypertensive emergency. Unknown etiology. MRA of the head did not show vascular abnormalities however CTA is pending. Could be secondary to amyloid angiopathy but no evidence in the rest of the brain for this pathology.   Sarina Ill, MD Stroke Neurology [phone] [facility]    To  contact Stroke Continuity provider, please refer to [email or web address] After hours, contact General Neurology

## 2015-11-14 NOTE — Evaluation (Signed)
Physical Therapy Evaluation Patient Details Name: GARHETT VALLA MRN: [id number] DOB: [birth year] Today's Date: 11/14/2015   History of Present Illness  This 70 y.o. male admitted to [facility] with slurred speech and numbness in his mouth.   CT of head showed small area of bleed in Lt frontal region probably suggesting an ischemic stroke with hemorrhagic conversion.  PMH includes:  HTN, Hyperlipidemia  Clinical Impression  Pt admitted with above diagnosis. Pt currently with functional limitations due to the deficits listed below (see PT Problem List).  Pt will benefit from skilled PT to increase their independence and safety with mobility to allow discharge to the venue listed below. Pt moving well overall and in low fall risk category based on DGI.  Pt did have 3 LOB when having to step backwards.  Will follow acutely to assess balance again, but no post acute PT needed.      Follow Up Recommendations No PT follow up    Equipment Recommendations  None recommended by PT    Recommendations for Other Services       Precautions / Restrictions Precautions Precautions: Fall      Mobility  Bed Mobility               General bed mobility comments: up in chair upon arrival  Transfers Overall transfer level: Modified independent                  Ambulation/Gait Ambulation/Gait assistance: Modified independent (Device/Increase time);Min guard Ambulation Distance (Feet): 250 Feet Assistive device: None Gait Pattern/deviations: Step-through pattern     General Gait Details: Pt with no difficulty with straight path ambulation and no LOB, but on 3 occasions when having to step backwards he had LOB requiring MIN/guard. Pt feels it is due to his gripper socks.  Stairs            Wheelchair Mobility    Modified Rankin (Stroke Patients Only) Modified Rankin (Stroke Patients Only) Pre-Morbid Rankin Score: No symptoms Modified Rankin: Slight disability      Balance Overall balance assessment: No apparent balance deficits (not formally assessed)                             High Level Balance Comments: 3 LOB when stepping backwards at various times during eval Standardized Balance Assessment Standardized Balance Assessment : Dynamic Gait Index   Dynamic Gait Index Level Surface: Normal Change in Gait Speed: Normal Gait with Horizontal Head Turns: Mild Impairment Gait with Vertical Head Turns: Normal Gait and Pivot Turn: Normal Step Over Obstacle: Normal Step Around Obstacles: Normal Steps: Mild Impairment Total Score: 22       Pertinent Vitals/Pain Pain Assessment: No/denies pain    Home Living Family/patient expects to be discharged to:: Private residence Living Arrangements: Spouse/significant other Available Help at Discharge: Friend(s) Type of Home: Apartment Home Access: Stairs to enter   Technical brewer of Steps: 1 Home Layout: One level Home Equipment: None Additional Comments: Pt lives alone in an apartment in [place], but girlfriend lives in [place], and pt stays with her frequently.  He plans to discharge to her home     Prior Function Level of Independence: Independent         Comments: Pt worked as a Curator, and is now retired      Journalist, newspaper   Dominant Hand: Right    Extremity/Trunk Assessment   Upper Extremity Assessment: Defer to OT evaluation  Lower Extremity Assessment: Overall WFL for tasks assessed      Cervical / Trunk Assessment: Normal  Communication   Communication: Expressive difficulties  Cognition Arousal/Alertness: Awake/alert Behavior During Therapy: WFL for tasks assessed/performed Overall Cognitive Status: Within Functional Limits for tasks assessed Area of Impairment: Attention   Current Attention Level: Alternating           General Comments: Pt able to perform simple math.  while ambulating.  He recalle therapist from  earlier, and appropriately relating events of day to his sister.  He did struggle somewhat with serial subtraction of 7's, but reports math was an issue for him PTA.      General Comments General comments (skin integrity, edema, etc.): Reviewed signs and symptoms of stroke.  Pt able to verbalize understanding via teach back method     Exercises        Assessment/Plan    PT Assessment Patient needs continued PT services  PT Diagnosis Difficulty walking   PT Problem List Decreased balance;Decreased mobility  PT Treatment Interventions Gait training;Balance training   PT Goals (Current goals can be found in the Care Plan section) Acute Rehab PT Goals Patient Stated Goal: to go home  PT Goal Formulation: With patient Time For Goal Achievement: 11/19/15 Potential to Achieve Goals: Good    Frequency Min 2X/week   Barriers to discharge        Co-evaluation               End of Session Equipment Utilized During Treatment: Gait belt Activity Tolerance: Patient tolerated treatment well Patient left: in chair;with call bell/phone within reach;with family/visitor present Nurse Communication: Mobility status         Time: 1431-1457 PT Time Calculation (min) (ACUTE ONLY): 26 min   Charges:   PT Evaluation $PT Eval Low Complexity: 1 Procedure PT Treatments $Gait Training: 8-22 mins   PT G Codes:        Dara Beidleman LUBECK 11/14/2015, 4:01 PM

## 2015-11-14 NOTE — Evaluation (Signed)
Occupational Therapy Evaluation Patient Details Name: Timothy Maddox MRN: [id number] DOB: [birth year] Today's Date: 11/14/2015    History of Present Illness This 70 y.o. male admitted to [facility] with slurred speech and numbness in his mouth.   CT of head showed small area of bleed in Lt frontal region probably suggesting an ischemic stroke with hemorrhagic conversion.  PMH includes:  HTN, Hyperlipidemia   Clinical Impression   Patient evaluated by Occupational Therapy with no further acute OT needs identified. All education has been completed and the patient has no further questions. Pt appears back to baseline except for language deficits.   He did demonstrate difficulty with serial counting, but reports this would not have been something he could have done PTA.   See below for any follow-up Occupational Therapy or equipment needs. OT is signing off. Thank you for this referral.       Follow Up Recommendations  No OT follow up;Supervision - Intermittent    Equipment Recommendations  None recommended by OT    Recommendations for Other Services       Precautions / Restrictions Precautions Precautions: Fall      Mobility Bed Mobility                  Transfers Overall transfer level: Modified independent                    Balance Overall balance assessment: No apparent balance deficits (not formally assessed)                                          ADL Overall ADL's : Modified independent                                             Vision Vision Assessment?: Yes Eye Alignment: Within Functional Limits Alignment/Gaze Preference: Within Defined Limits Tracking/Visual Pursuits: Able to track stimulus in all quads without difficulty Saccades: Within functional limits Visual Fields: No apparent deficits Additional Comments: Pt reading phone and texting without difficulty   Perception Perception Perception  Tested?: Yes   Praxis Praxis Praxis tested?: Within functional limits    Pertinent Vitals/Pain Pain Assessment: No/denies pain     Hand Dominance Right   Extremity/Trunk Assessment Upper Extremity Assessment Upper Extremity Assessment: Overall WFL for tasks assessed   Lower Extremity Assessment Lower Extremity Assessment: Defer to PT evaluation   Cervical / Trunk Assessment Cervical / Trunk Assessment: Normal   Communication Communication Communication: Expressive difficulties   Cognition Arousal/Alertness: Awake/alert Behavior During Therapy: WFL for tasks assessed/performed Overall Cognitive Status: Within Functional Limits for tasks assessed Area of Impairment: Attention   Current Attention Level: Alternating;Divided           General Comments: Pt able to perform simple math.  while ambulating.  He recalle therapist from earlier, and appropriately relating events of day to his sister.  He did struggle somewhat with serial subtraction of 7's, but reports math was an issue for him PTA.     General Comments       Exercises       Shoulder Instructions      Home Living Family/patient expects to be discharged to:: Private residence Living Arrangements: Spouse/significant other Available Help at Discharge: Friend(s) Type of  Home: Apartment Home Access: Stairs to enter Entrance Stairs-Number of Steps: 1   Home Layout: One level     Bathroom Shower/Tub: Tub/shower unit;Walk-in shower Shower/tub characteristics: Architectural technologist: Standard     Home Equipment: None   Additional Comments: Pt lives alone in an apartment in [place], but girlfriend lives in [place], and pt stays with her frequently.  He plans to discharge to her home   Lives With: Alone    Prior Functioning/Environment Level of Independence: Independent        Comments: Pt worked as a Curator, and is now retired     OT Diagnosis:     OT Problem List:     OT  Treatment/Interventions:      OT Goals(Current goals can be found in the care plan section) Acute Rehab OT Goals Patient Stated Goal: to go home  OT Goal Formulation: All assessment and education complete, DC therapy  OT Frequency:     Barriers to D/C:            Co-evaluation              End of Session Nurse Communication: Mobility status  Activity Tolerance: Patient tolerated treatment well Patient left: in chair;with call bell/phone within reach;with family/visitor present   Time: 1242-1310 OT Time Calculation (min): 28 min Charges:  OT General Charges $OT Visit: 1 Procedure OT Evaluation $OT Eval Moderate Complexity: 1 Procedure OT Treatments $Therapeutic Activity: 8-22 mins G-Codes:    Michelina Mexicano M 12-11-15, 1:26 PM

## 2015-11-14 NOTE — Progress Notes (Signed)
*  PRELIMINARY RESULTS* Vascular Ultrasound Carotid Duplex (Doppler) has been completed.  There is no obvious evidence of hemodynamically significant internal carotid artery stenosis bilaterally. The left vertebral artery is patent with antegrade flow. Unable to visualized the right vertebral artery.  11/14/2015 12:31 PM Maudry Mayhew, RVT, RDCS, RDMS

## 2015-11-15 ENCOUNTER — Encounter (HOSPITAL_COMMUNITY): Payer: Self-pay | Admitting: General Surgery

## 2015-11-15 DIAGNOSIS — I610 Nontraumatic intracerebral hemorrhage in hemisphere, subcortical: Secondary | ICD-10-CM

## 2015-11-15 LAB — CBC
HEMATOCRIT: 43.3 % (ref 39.0–52.0)
Hemoglobin: 14.7 g/dL (ref 13.0–17.0)
MCH: 30.2 pg (ref 26.0–34.0)
MCHC: 33.9 g/dL (ref 30.0–36.0)
MCV: 89.1 fL (ref 78.0–100.0)
PLATELETS: 180 10*3/uL (ref 150–400)
RBC: 4.86 MIL/uL (ref 4.22–5.81)
RDW: 13 % (ref 11.5–15.5)
WBC: 5 10*3/uL (ref 4.0–10.5)

## 2015-11-15 LAB — HEMOGLOBIN A1C
Hgb A1c MFr Bld: 6.2 % — ABNORMAL HIGH (ref 4.8–5.6)
Mean Plasma Glucose: 131 mg/dL

## 2015-11-15 LAB — BASIC METABOLIC PANEL
Anion gap: 7 (ref 5–15)
BUN: 15 mg/dL (ref 6–20)
CHLORIDE: 105 mmol/L (ref 101–111)
CO2: 25 mmol/L (ref 22–32)
CREATININE: 1.24 mg/dL (ref 0.61–1.24)
Calcium: 8.7 mg/dL — ABNORMAL LOW (ref 8.9–10.3)
GFR calc non Af Amer: 58 mL/min — ABNORMAL LOW (ref >60)
Glucose, Bld: 111 mg/dL — ABNORMAL HIGH (ref 65–99)
POTASSIUM: 3.7 mmol/L (ref 3.5–5.1)
Sodium: 137 mmol/L (ref 135–145)

## 2015-11-15 NOTE — Consult Note (Signed)
Chief Complaint: [place]  Referring Physician:Dr. Antony Contras  Supervising Physician: [name]  Patient Status: In-pt   HPI: Timothy Maddox is an 70 y.o. male who presented to [facility] ED with speech difficulties.  He had a CT scan that revealed that revealed a left frontal lobe hemorrhage.  He is doing better, but the stroke team has asked [place] to evaluate the patient for a diagnostic cerebral angiogram to determine a possible source of his hemorrhage.  The patient has no complaints currently.  Past Medical History:  Past Medical History  Diagnosis Date  . Hypertension   . Prediabetes   . Hyperlipidemia     Past Surgical History:  Past Surgical History  Procedure Laterality Date  . Back surgery    . Toe surgery Left     Family History:  Family History  Problem Relation Age of Onset  . Heart failure Father   . Diabetes Father   . Hypertension Father     Social History:  reports that he has never smoked. He does not have any smokeless tobacco history on file. He reports that he does not drink alcohol or use illicit drugs.  Allergies: No Known Allergies  Medications:   Medication List    ASK your doctor about these medications        amLODipine 10 MG tablet  Commonly known as:  NORVASC  Take 10 mg by mouth daily.     aspirin EC 81 MG tablet  Take 81 mg by mouth daily.     losartan-hydrochlorothiazide 100-12.5 MG tablet  Commonly known as:  HYZAAR  Take 1 tablet by mouth daily.     metoprolol 100 MG tablet  Commonly known as:  LOPRESSOR  Take 100 mg by mouth 2 (two) times daily.     rosuvastatin 10 MG tablet  Commonly known as:  CRESTOR  Take 10 mg by mouth daily.     spironolactone 100 MG tablet  Commonly known as:  ALDACTONE  Take 100 mg by mouth 2 (two) times daily.        Please HPI for pertinent positives, otherwise complete 10 system ROS negative.  Mallampati Score: MD Evaluation Airway: WNL Heart: WNL Abdomen: WNL Chest/  Lungs: WNL ASA  Classification: 3 Mallampati/Airway Score: Two  Physical Exam: BP 139/91 mmHg  Pulse 48  Temp(Src) 98.3 F (36.8 C) (Oral)  Resp 27  Ht 6' (1.829 m)  Wt 259 lb 14.8 oz (117.9 kg)  BMI 35.24 kg/m2  SpO2 100% Body mass index is 35.24 kg/(m^2). General: pleasant, obese white male who is laying in bed in NAD HEENT: head is normocephalic, atraumatic.  Sclera are noninjected.  PERRL.  Ears and nose without any masses or lesions.  Mouth is pink. Right sided facial droop is present.  Dysarthric speech. Heart: regular, rate, and rhythm.  Normal s1,s2. No obvious murmurs, gallops, or rubs noted.  Palpable radial and pedal pulses bilaterally Lungs: CTAB, no wheezes, rhonchi, or rales noted.  Respiratory effort nonlabored Abd: soft, NT, ND, +BS, no masses, hernias, or organomegaly Psych: A&Ox3 with an appropriate affect.   Labs: Results for orders placed or performed during the hospital encounter of 11/13/15 (from the past 48 hour(s))  MRSA PCR Screening     Status: None   Collection Time: 11/13/15  8:11 PM  Result Value Ref Range   MRSA by PCR NEGATIVE NEGATIVE    Comment:        The GeneXpert MRSA Assay (FDA approved for  NASAL specimens only), is one component of a comprehensive MRSA colonization surveillance program. It is not intended to diagnose MRSA infection nor to guide or monitor treatment for MRSA infections.   Hemoglobin A1c     Status: Abnormal   Collection Time: 11/14/15  4:24 AM  Result Value Ref Range   Hgb A1c MFr Bld 6.2 (H) 4.8 - 5.6 %    Comment: (NOTE)         Pre-diabetes: 5.7 - 6.4         Diabetes: >6.4         Glycemic control for adults with diabetes: <7.0    Mean Plasma Glucose 131 mg/dL    Comment: (NOTE) Performed At: [facility] [place], [place] [id number] Lindon Romp MD YV:[id number]   Lipid panel     Status: Abnormal   Collection Time: 11/14/15  4:24 AM  Result Value Ref Range   Cholesterol 101 0  - 200 mg/dL   Triglycerides 148 <150 mg/dL   HDL 28 (L) >40 mg/dL   Total CHOL/HDL Ratio 3.6 RATIO   VLDL 30 0 - 40 mg/dL   LDL Cholesterol 43 0 - 99 mg/dL    Comment:        Total Cholesterol/HDL:CHD Risk Coronary Heart Disease Risk Table                     Men   Women  1/2 Average Risk   3.4   3.3  Average Risk       5.0   4.4  2 X Average Risk   9.6   7.1  3 X Average Risk  23.4   11.0        Use the calculated Patient Ratio above and the CHD Risk Table to determine the patient's CHD Risk.        ATP III CLASSIFICATION (LDL):  <100     mg/dL   Optimal  100-129  mg/dL   Near or Above                    Optimal  130-159  mg/dL   Borderline  160-189  mg/dL   High  >190     mg/dL   Very High   Basic metabolic panel     Status: Abnormal   Collection Time: 11/15/15  6:57 AM  Result Value Ref Range   Sodium 137 135 - 145 mmol/L   Potassium 3.7 3.5 - 5.1 mmol/L   Chloride 105 101 - 111 mmol/L   CO2 25 22 - 32 mmol/L   Glucose, Bld 111 (H) 65 - 99 mg/dL   BUN 15 6 - 20 mg/dL   Creatinine, Ser 1.24 0.61 - 1.24 mg/dL   Calcium 8.7 (L) 8.9 - 10.3 mg/dL   GFR calc non Af Amer 58 (L) >60 mL/min   GFR calc Af Amer >60 >60 mL/min    Comment: (NOTE) The eGFR has been calculated using the CKD EPI equation. This calculation has not been validated in all clinical situations. eGFR's persistently <60 mL/min signify possible Chronic Kidney Disease.    Anion gap 7 5 - 15  CBC     Status: None   Collection Time: 11/15/15  6:57 AM  Result Value Ref Range   WBC 5.0 4.0 - 10.5 K/uL   RBC 4.86 4.22 - 5.81 MIL/uL   Hemoglobin 14.7 13.0 - 17.0 g/dL   HCT 43.3 39.0 - 52.0 %  MCV 89.1 78.0 - 100.0 fL   MCH 30.2 26.0 - 34.0 pg   MCHC 33.9 30.0 - 36.0 g/dL   RDW 13.0 11.5 - 15.5 %   Platelets 180 150 - 400 K/uL    Imaging: Ct Angio Head W Or Wo Contrast  11/14/2015  CLINICAL DATA:  70 year old male with speech difficulties. EXAM: CT ANGIOGRAPHY HEAD TECHNIQUE: Multidetector CT  imaging of the head was performed using the standard protocol during bolus administration of intravenous contrast. Multiplanar CT image reconstructions and MIPs were obtained to evaluate the vascular anatomy. CONTRAST:  50 cc Isovue 370. COMPARISON:  MRI of the brain dated 11/13/2015, MRA of the head dated 11/13/2015, CT head dated 11/13/2015. FINDINGS: CTA HEAD Anterior circulation: Visualized upper cervical internal carotid arteries and intracranial internal carotid arteries are patent. Mild calcific atherosclerosis of the distal cavernous segments without significant stenosis. New bilateral proximal middle cerebral arteries and anterior cerebral arteries are patent. Anterior communicating artery left posterior communicating arteries are patent. The right posterior communicating artery is diminutive or absent. No large vessel occlusion, high-grade stenosis, or aneurysm of the anterior circulation is identified. In the left MCA distribution there is a relative increase in both arterial and venous structures, both superficial and deep, without a discrete vascular nidus. He the left-sided vein of Labbe may interface of the brain parenchyma in the left posterior temporal lobe (series 9, image 105). Posterior circulation: Mild calcific atherosclerosis of bilateral V4 segments without significant stenosis. Codominant vertebrobasilar system. Basilar artery is patent. Bilateral posterior cerebral arteries are patent. No large vessel occlusion, high-grade stenosis, or aneurysm of the posterior circulation is identified. Venous sinuses: Patent. Anatomic variants: None. Delayed phase:Left frontal parenchymal hematoma is stable in size with minimal associated surrounding vasogenic edema and local mass effect. No abnormal enhancement of the brain parenchyma. IMPRESSION: 1. No large vessel occlusion, significant stenosis, or aneurysm of the proximal circle of Willis. Mild intracranial calcific atherosclerosis. 2. Stable left  frontal parenchyma hematoma. 3. Asymmetric increase in the left MCA distribution arterial and venous structures is atypical for luxury perfusion or hyperemia in the setting of recent ischemia. No discrete vascular nidus is identified to suggest an arteriovenous malformation, but there are prominent superficial cortical veins and a dural arteriovenous fistula is possible. This can be further evaluated with conventional cerebral angiogram. Electronically Signed   By: Kristine Garbe M.D.   On: 11/14/2015 13:28   Dg Chest 2 View  11/13/2015  CLINICAL DATA:  Recent stroke EXAM: CHEST  2 VIEW COMPARISON:  08/27/2014 FINDINGS: The heart size and mediastinal contours are within normal limits. Both lungs are clear. The visualized skeletal structures are unremarkable. IMPRESSION: No active cardiopulmonary disease. Electronically Signed   By: Inez Catalina M.D.   On: 11/13/2015 21:30   Ct Head Wo Contrast  11/13/2015  CLINICAL DATA:  Dysarthria EXAM: CT HEAD WITHOUT CONTRAST TECHNIQUE: Contiguous axial images were obtained from the base of the skull through the vertex without intravenous contrast. COMPARISON:  October 23, 2004 FINDINGS: Brain: There is ventricular enlargement with sulci normal, a stable finding. There is a focal area of hemorrhage in the posterior left frontal lobe measuring 1.6 x 1.0 cm no other hemorrhage is seen. There is no well-defined mass. There is no extra-axial fluid or midline shift. There is mild small vessel disease in the centra semiovale bilaterally. Vascular: There is no hyperdense vessel. There are foci of arterial vascular calcification cavernous carotid arteries bilaterally. Skull: The bony calvarium appears intact. Sinuses/Orbits: The visualized  paranasal sinuses are clear. No intraorbital lesions are evident in the visualized orbital regions. Other: Visualized mastoid air cells are clear. IMPRESSION: Focal area of hemorrhage in the left frontal lobe. Suspect focal hemorrhagic  infarct. There is mild periventricular small vessel disease, stable. There is generalized ventricular enlargement with normal appearing sulci, a chronic finding. Question a degree of normal pressure hydrocephalus. Critical Value/emergent results were called by telephone at the time of interpretation on 11/13/2015 at 2:25 pm to Dr. Lavonia Drafts , who verbally acknowledged these results. Electronically Signed   By: Lowella Grip III M.D.   On: 11/13/2015 14:25   Mr Brain Wo Contrast  11/13/2015  CLINICAL DATA:  Stroke.  Intracranial hemorrhage EXAM: MRI HEAD WITHOUT CONTRAST MRA HEAD WITHOUT CONTRAST TECHNIQUE: Multiplanar, multiecho pulse sequences of the brain and surrounding structures were obtained without intravenous contrast. Angiographic images of the head were obtained using MRA technique without contrast. COMPARISON:  CT head 11/13/2015 FINDINGS: MRI HEAD FINDINGS Mild ventricular enlargement is unchanged. This is greater than expected for the degree of atrophy with minimal prominence of the sylvian fissures. Possible normal pressure hydrocephalus 10 x 15 mm hemorrhage in the left posterior frontal lobe unchanged from CT. No associated ischemic infarct in the area. No other areas of intracranial hemorrhage Negative for acute infarct. Mild chronic microvascular ischemic change in the white matter. Basal ganglia and brainstem normal. Negative for mass or edema.  No shift of the midline structures. Paranasal sinuses are clear. MRA HEAD FINDINGS Both vertebral arteries patent to the basilar. PICA patent bilaterally. Basilar patent. Superior cerebellar and posterior cerebral arteries patent bilaterally. Internal carotid artery patent bilaterally without stenosis. Anterior and middle cerebral arteries appear normal bilaterally. Patent posterior communicating artery bilaterally left greater than right. Negative for cerebral aneurysm. IMPRESSION: 10 x 15 mm acute hemorrhage left frontal lobe unchanged from CT  today. No evidence of surrounding infarct. This may be due to hypertension. Amyloid is also a possibility. Ventricular enlargement is unchanged and suggestive of normal pressure hydrocephalus. Mild chronic microvascular ischemic change in the white matter Negative MRA Electronically Signed   By: Franchot Gallo M.D.   On: 11/13/2015 21:14   Mr Jodene Nam Head/brain Wo Cm  11/13/2015  CLINICAL DATA:  Stroke.  Intracranial hemorrhage EXAM: MRI HEAD WITHOUT CONTRAST MRA HEAD WITHOUT CONTRAST TECHNIQUE: Multiplanar, multiecho pulse sequences of the brain and surrounding structures were obtained without intravenous contrast. Angiographic images of the head were obtained using MRA technique without contrast. COMPARISON:  CT head 11/13/2015 FINDINGS: MRI HEAD FINDINGS Mild ventricular enlargement is unchanged. This is greater than expected for the degree of atrophy with minimal prominence of the sylvian fissures. Possible normal pressure hydrocephalus 10 x 15 mm hemorrhage in the left posterior frontal lobe unchanged from CT. No associated ischemic infarct in the area. No other areas of intracranial hemorrhage Negative for acute infarct. Mild chronic microvascular ischemic change in the white matter. Basal ganglia and brainstem normal. Negative for mass or edema.  No shift of the midline structures. Paranasal sinuses are clear. MRA HEAD FINDINGS Both vertebral arteries patent to the basilar. PICA patent bilaterally. Basilar patent. Superior cerebellar and posterior cerebral arteries patent bilaterally. Internal carotid artery patent bilaterally without stenosis. Anterior and middle cerebral arteries appear normal bilaterally. Patent posterior communicating artery bilaterally left greater than right. Negative for cerebral aneurysm. IMPRESSION: 10 x 15 mm acute hemorrhage left frontal lobe unchanged from CT today. No evidence of surrounding infarct. This may be due to hypertension. Amyloid is also  a possibility. Ventricular  enlargement is unchanged and suggestive of normal pressure hydrocephalus. Mild chronic microvascular ischemic change in the white matter Negative MRA Electronically Signed   By: Franchot Gallo M.D.   On: 11/13/2015 21:14    Assessment/Plan 1. Left frontal lobe, ICH -we will plan for a diagnostic cerebral angiogram.  Unfortunately, we have an emergency that just came in.  We will have to plan to do this tomorrow.  The patient may eat today, and NPO p MN tonight for procedure tomorrow. -his labs and vitals have been reviewed -Risks and Benefits discussed with the patient including, but not limited to bleeding, infection, vascular injury or contrast induced renal failure. All of the patient's questions were answered, patient is agreeable to proceed. Consent signed and in chart.   Thank you for this interesting consult.  I greatly enjoyed meeting Timothy Maddox and look forward to participating in their care.  A copy of this report was sent to the requesting provider on this date.  Electronically Signed: Henreitta Cea 11/15/2015, 11:24 AM   I spent a total of 40 Minutes    in face to face in clinical consultation, greater than 50% of which was counseling/coordinating care for Intra-cranial hemorrhage

## 2015-11-15 NOTE — Progress Notes (Signed)
Speech Language Pathology Treatment: Cognitive-Linquistic  Patient Details Name: Timothy Maddox MRN: [id number] DOB: 1945/10/25 Today's Date: 11/15/2015 Time: 1550-1606 SLP Time Calculation (min) (ACUTE ONLY): 16 min  Assessment / Plan / Recommendation Clinical Impression  Pt seen for f/u cognitive and communicative therapy. He continues to have a moderate-severe dysarthria at the conversational level. At the word to short phrase level, SLP provided Mod cues for over articulation and pausing between words to increase intelligibility. Mod cues also provided for working memory and mildly complex problem solving during menu reading task. Pt will need OP SLP f/u and 24/7 supervision upon return home.   HPI HPI: 70 y.o. male with history of HTN and HLD that developed numbness of his mouth and garbled speech. CT showed focal area of hemorrhage in the left frontal lobe. MRI without evidence of surrounding infarct.      SLP Plan  Continue with current plan of care     Recommendations                Follow up Recommendations: Outpatient SLP Plan: Continue with current plan of care     GO               Germain Osgood, M.A. CCC-SLP [phone]  Germain Osgood 11/15/2015, 4:21 PM

## 2015-11-15 NOTE — Progress Notes (Signed)
STROKE TEAM PROGRESS NOTE   SUBJECTIVE (INTERVAL HISTORY) His NT is at the bedside.  He is sitting up in the chair taking a bath. No new complaints. Patient willing to get test done, but upset that he cannot go home.   OBJECTIVE Temp:  [98 F (36.7 C)-98.9 F (37.2 C)] 98.3 F (36.8 C) (07/13 0800) Pulse Rate:  [48-63] 48 (07/13 0800) Cardiac Rhythm:  [-] Normal sinus rhythm;Sinus bradycardia (07/13 0800) Resp:  [15-27] 27 (07/13 0800) BP: (126-145)/(78-97) 139/91 mmHg (07/13 0800) SpO2:  [95 %-100 %] 100 % (07/13 0800)  CBC:   Recent Labs Lab 11/13/15 1330 11/15/15 0657  WBC 6.1 5.0  NEUTROABS 3.7  --   HGB 16.0 14.7  HCT 45.4 43.3  MCV 89.0 89.1  PLT 205 [id number]    Basic Metabolic Panel:   Recent Labs Lab 11/13/15 1330 11/15/15 0657  NA 137 137  K 3.4* 3.7  CL 103 105  CO2 25 25  GLUCOSE 148* 111*  BUN 20 15  CREATININE 1.43* 1.24  CALCIUM 9.4 8.7*    Lipid Panel:     Component Value Date/Time   CHOL 101 11/14/2015 0424   TRIG 148 11/14/2015 0424   HDL 28* 11/14/2015 0424   CHOLHDL 3.6 11/14/2015 0424   VLDL 30 11/14/2015 0424   LDLCALC 43 11/14/2015 0424   HgbA1c:  Lab Results  Component Value Date   HGBA1C 6.2* 11/14/2015   Urine Drug Screen:     Component Value Date/Time   LABOPIA NONE DETECTED 11/13/2015 1359   COCAINSCRNUR NONE DETECTED 11/13/2015 1359   LABBENZ NONE DETECTED 11/13/2015 1359   AMPHETMU NONE DETECTED 11/13/2015 1359   THCU NONE DETECTED 11/13/2015 1359   LABBARB NONE DETECTED 11/13/2015 1359      IMAGING  Ct Angio Head W Or Wo Contrast  11/14/2015  CLINICAL DATA:  70 year old male with speech difficulties. EXAM: CT ANGIOGRAPHY HEAD TECHNIQUE: Multidetector CT imaging of the head was performed using the standard protocol during bolus administration of intravenous contrast. Multiplanar CT image reconstructions and MIPs were obtained to evaluate the vascular anatomy. CONTRAST:  50 cc Isovue 370. COMPARISON:  MRI of the brain  dated 11/13/2015, MRA of the head dated 11/13/2015, CT head dated 11/13/2015. FINDINGS: CTA HEAD Anterior circulation: Visualized upper cervical internal carotid arteries and intracranial internal carotid arteries are patent. Mild calcific atherosclerosis of the distal cavernous segments without significant stenosis. New bilateral proximal middle cerebral arteries and anterior cerebral arteries are patent. Anterior communicating artery left posterior communicating arteries are patent. The right posterior communicating artery is diminutive or absent. No large vessel occlusion, high-grade stenosis, or aneurysm of the anterior circulation is identified. In the left MCA distribution there is a relative increase in both arterial and venous structures, both superficial and deep, without a discrete vascular nidus. He the left-sided vein of Labbe may interface of the brain parenchyma in the left posterior temporal lobe (series 9, image 105). Posterior circulation: Mild calcific atherosclerosis of bilateral V4 segments without significant stenosis. Codominant vertebrobasilar system. Basilar artery is patent. Bilateral posterior cerebral arteries are patent. No large vessel occlusion, high-grade stenosis, or aneurysm of the posterior circulation is identified. Venous sinuses: Patent. Anatomic variants: None. Delayed phase:Left frontal parenchymal hematoma is stable in size with minimal associated surrounding vasogenic edema and local mass effect. No abnormal enhancement of the brain parenchyma. IMPRESSION: 1. No large vessel occlusion, significant stenosis, or aneurysm of the proximal circle of Willis. Mild intracranial calcific atherosclerosis. 2. Stable left frontal  parenchyma hematoma. 3. Asymmetric increase in the left MCA distribution arterial and venous structures is atypical for luxury perfusion or hyperemia in the setting of recent ischemia. No discrete vascular nidus is identified to suggest an arteriovenous  malformation, but there are prominent superficial cortical veins and a dural arteriovenous fistula is possible. This can be further evaluated with conventional cerebral angiogram. Electronically Signed   By: Kristine Garbe M.D.   On: 11/14/2015 13:28   Dg Chest 2 View  11/13/2015  CLINICAL DATA:  Recent stroke EXAM: CHEST  2 VIEW COMPARISON:  08/27/2014 FINDINGS: The heart size and mediastinal contours are within normal limits. Both lungs are clear. The visualized skeletal structures are unremarkable. IMPRESSION: No active cardiopulmonary disease. Electronically Signed   By: Inez Catalina M.D.   On: 11/13/2015 21:30   Ct Head Wo Contrast  11/13/2015  CLINICAL DATA:  Dysarthria EXAM: CT HEAD WITHOUT CONTRAST TECHNIQUE: Contiguous axial images were obtained from the base of the skull through the vertex without intravenous contrast. COMPARISON:  October 23, 2004 FINDINGS: Brain: There is ventricular enlargement with sulci normal, a stable finding. There is a focal area of hemorrhage in the posterior left frontal lobe measuring 1.6 x 1.0 cm no other hemorrhage is seen. There is no well-defined mass. There is no extra-axial fluid or midline shift. There is mild small vessel disease in the centra semiovale bilaterally. Vascular: There is no hyperdense vessel. There are foci of arterial vascular calcification cavernous carotid arteries bilaterally. Skull: The bony calvarium appears intact. Sinuses/Orbits: The visualized paranasal sinuses are clear. No intraorbital lesions are evident in the visualized orbital regions. Other: Visualized mastoid air cells are clear. IMPRESSION: Focal area of hemorrhage in the left frontal lobe. Suspect focal hemorrhagic infarct. There is mild periventricular small vessel disease, stable. There is generalized ventricular enlargement with normal appearing sulci, a chronic finding. Question a degree of normal pressure hydrocephalus. Critical Value/emergent results were called by  telephone at the time of interpretation on 11/13/2015 at 2:25 pm to Dr. Lavonia Drafts , who verbally acknowledged these results. Electronically Signed   By: Lowella Grip III M.D.   On: 11/13/2015 14:25   Mr Brain Wo Contrast  11/13/2015  CLINICAL DATA:  Stroke.  Intracranial hemorrhage EXAM: MRI HEAD WITHOUT CONTRAST MRA HEAD WITHOUT CONTRAST TECHNIQUE: Multiplanar, multiecho pulse sequences of the brain and surrounding structures were obtained without intravenous contrast. Angiographic images of the head were obtained using MRA technique without contrast. COMPARISON:  CT head 11/13/2015 FINDINGS: MRI HEAD FINDINGS Mild ventricular enlargement is unchanged. This is greater than expected for the degree of atrophy with minimal prominence of the sylvian fissures. Possible normal pressure hydrocephalus 10 x 15 mm hemorrhage in the left posterior frontal lobe unchanged from CT. No associated ischemic infarct in the area. No other areas of intracranial hemorrhage Negative for acute infarct. Mild chronic microvascular ischemic change in the white matter. Basal ganglia and brainstem normal. Negative for mass or edema.  No shift of the midline structures. Paranasal sinuses are clear. MRA HEAD FINDINGS Both vertebral arteries patent to the basilar. PICA patent bilaterally. Basilar patent. Superior cerebellar and posterior cerebral arteries patent bilaterally. Internal carotid artery patent bilaterally without stenosis. Anterior and middle cerebral arteries appear normal bilaterally. Patent posterior communicating artery bilaterally left greater than right. Negative for cerebral aneurysm. IMPRESSION: 10 x 15 mm acute hemorrhage left frontal lobe unchanged from CT today. No evidence of surrounding infarct. This may be due to hypertension. Amyloid is also a possibility. Ventricular  enlargement is unchanged and suggestive of normal pressure hydrocephalus. Mild chronic microvascular ischemic change in the white matter  Negative MRA Electronically Signed   By: Franchot Gallo M.D.   On: 11/13/2015 21:14   Mr Jodene Nam Head/brain Wo Cm  11/13/2015  CLINICAL DATA:  Stroke.  Intracranial hemorrhage EXAM: MRI HEAD WITHOUT CONTRAST MRA HEAD WITHOUT CONTRAST TECHNIQUE: Multiplanar, multiecho pulse sequences of the brain and surrounding structures were obtained without intravenous contrast. Angiographic images of the head were obtained using MRA technique without contrast. COMPARISON:  CT head 11/13/2015 FINDINGS: MRI HEAD FINDINGS Mild ventricular enlargement is unchanged. This is greater than expected for the degree of atrophy with minimal prominence of the sylvian fissures. Possible normal pressure hydrocephalus 10 x 15 mm hemorrhage in the left posterior frontal lobe unchanged from CT. No associated ischemic infarct in the area. No other areas of intracranial hemorrhage Negative for acute infarct. Mild chronic microvascular ischemic change in the white matter. Basal ganglia and brainstem normal. Negative for mass or edema.  No shift of the midline structures. Paranasal sinuses are clear. MRA HEAD FINDINGS Both vertebral arteries patent to the basilar. PICA patent bilaterally. Basilar patent. Superior cerebellar and posterior cerebral arteries patent bilaterally. Internal carotid artery patent bilaterally without stenosis. Anterior and middle cerebral arteries appear normal bilaterally. Patent posterior communicating artery bilaterally left greater than right. Negative for cerebral aneurysm. IMPRESSION: 10 x 15 mm acute hemorrhage left frontal lobe unchanged from CT today. No evidence of surrounding infarct. This may be due to hypertension. Amyloid is also a possibility. Ventricular enlargement is unchanged and suggestive of normal pressure hydrocephalus. Mild chronic microvascular ischemic change in the white matter Negative MRA Electronically Signed   By: Franchot Gallo M.D.   On: 11/13/2015 21:14   2D Echocardiogram  - Left  ventricle: The cavity size was normal. There was severe focal basal and mild concentric hypertrophy of the left ventricle. Systolic function was vigorous. The estimated ejection fraction was in the range of 65% to 70%. Wall motion was normal; there were no regional wall motion abnormalities. Doppler parameters are consistent with abnormal left ventricular relaxation (grade 1 diastolic dysfunction). There was no evidence of elevated ventricular filling pressure by Doppler parameters. - Aortic valve: Trileaflet; normal thickness leaflets. There was no regurgitation. - Aortic root: The aortic root was normal in size. - Ascending aorta: The ascending aorta was normal in size. - Mitral valve: Calcified annulus. Mildly thickened leaflets . - Left atrium: The atrium was mildly dilated. - Right ventricle: Systolic function was normal. - Right atrium: The atrium was normal in size. - Tricuspid valve: There was no regurgitation. - Pulmonary arteries: Systolic pressure was within the normal range. - Inferior vena cava: The vessel was normal in size. - Pericardium, extracardiac: There was no pericardial effusion. Impressions:  No cardiac source of emboli was indentified.  Carotid Doppler   There is no obvious evidence of hemodynamically significant internal carotid artery stenosis bilaterally. The left vertebral artery is patent with antegrade flow. Unable to visualized the right vertebral artery.   PHYSICAL EXAM Exam: Gen: NAD, conversant, well nourised, obese, well groomed                     CV: RRR, no MRG. No Carotid Bruits. No peripheral edema, warm, nontender Eyes: Conjunctivae clear without exudates or hemorrhage  Neuro: Detailed Neurologic Exam  Speech:    Speech is dysarthric Cognition:    The patient is oriented to person, place, and time;  Cranial Nerves:    The pupils are equal, round, and reactive to light. The fundi are normal and spontaneous venous pulsations are present. Visual  fields are full to finger confrontation. Extraocular movements are intact. Trigeminal sensation is intact and the muscles of mastication are normal. The face is symmetric. The palate elevates in the midline. Hearing intact. Voice is normal. Shoulder shrug is normal. The tongue has normal motion without fasciculations.   Coordination:    Normal finger to nose.  Gait:    Heel-toe and tandem gait are normal.   Motor Observation:    No asymmetry, no atrophy, and no involuntary movements noted. Tone:    Normal muscle tone.    Strength:    Strength is V/V in the upper and lower limbs.      Sensation: intact to LT     Toes:    The toes are downgoing bilaterally.   Clonus:    Clonus is absent.    ASSESSMENT/PLAN [name] is a 71 y.o. male with history of HTN, pre-diabetes, and HLD presenting with speech difficulties. CT shows a L frontal hemorrhage.   Stroke:  Left frontal hemorrhage  secondary to unclear source  Resultant  dysarthria, HA  MRI  L frontal lobe ICH. No infarct. ? NPH. small vessel disease   Carotid Doppler  No significant stenosis   2D Echo  EF 65-70%. No source of embolus   CT angio head no large vessel occlusion. Stable L frontal hmg. 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 Estanislado Pandy (Leonie Man spoke with him) pt made NPO - to look for source of hmg given superficial cortical veins  CXR WNL  LDL 43  HgbA1c 6.2  SCDs for VTE prophylaxis Diet Heart Room service appropriate?: Yes; Fluid consistency:: Thin  aspirin 81 mg daily ordered PTA but was not taking routinely per him. now on No antithrombotic given hemorrhage  Ongoing aggressive stroke risk factor management  Tylenol for HA management  Resume home meds  Therapy recommendations:  No therapy needs  Disposition:  Return home (lives with girlfriend)  Needs repeat imaging (MRI) in 2-3 mos to look for  underlying source of hemorrhage  Ok to transfer to the floor from stroke standpoint  Hypertension  Stable  Long-term BP goal normotensive  Hyperlipidemia  Home meds:  crestor 10 , resumed in hospital  LDL 43, goal < 70  Continue statin at discharge  Pre-Diabetes  HgbA1c 6.2 , goal < 7.0  Other Stroke Risk Factors  Advanced age  Obesity, Body mass index is 35.24 kg/(m^2)., recommend weight loss, diet and exercise as appropriate   Other Active Problems  Possible poor memory - no reported history. Follow up with family  Hypokalemia  Elevated creatinine 1.43  Hospital day # 2  [place] for Pager information 11/15/2015 10:03 AM  I have personally examined this patient, reviewed notes, independently viewed imaging studies, participated in medical decision making and plan of care. I have made any additions or clarifications directly to the above note. Agree with note above.  The etiology of his left frontal hemorrhage remains indeterminate. Recommend cerebral catheter angiogram to look for an underlying AVM or other vascular abnormality. Patient agreeable to plan. Discussed with Dr. Estanislado Pandy. Greater than 50% time during this 35 minute visit was spent on counseling and coordination of care about his intracerebral hemorrhage, etiology and answering questions  Melodi Happel  Leonie Man, [place] Pager: [phone] 11/15/2015 4:33 PM   To contact Stroke Continuity provider, please refer to [email or web address] After hours, contact General Neurology

## 2015-11-15 NOTE — Care Management Important Message (Signed)
Important Message  Patient Details  Name: Timothy Maddox MRN: [id number] Date of Birth: 1945-09-26   Medicare Important Message Given:  Yes    Nathen May 11/15/2015, 1:34 PM

## 2015-11-16 ENCOUNTER — Inpatient Hospital Stay (HOSPITAL_COMMUNITY): Payer: Medicare HMO

## 2015-11-16 ENCOUNTER — Encounter (HOSPITAL_COMMUNITY): Payer: Self-pay

## 2015-11-16 DIAGNOSIS — I770 Arteriovenous fistula, acquired: Secondary | ICD-10-CM

## 2015-11-16 DIAGNOSIS — I619 Nontraumatic intracerebral hemorrhage, unspecified: Secondary | ICD-10-CM

## 2015-11-16 DIAGNOSIS — I671 Cerebral aneurysm, nonruptured: Secondary | ICD-10-CM

## 2015-11-16 LAB — VAS US CAROTID
LEFT ECA DIAS: -9 cm/s
LEFT VERTEBRAL DIAS: 10 cm/s
LICAPDIAS: -13 cm/s
Left CCA dist dias: 16 cm/s
Left CCA dist sys: 81 cm/s
Left CCA prox dias: 16 cm/s
Left CCA prox sys: 118 cm/s
Left ICA dist dias: -17 cm/s
Left ICA dist sys: -51 cm/s
Left ICA prox sys: -49 cm/s
RCCADSYS: -49 cm/s
RCCAPDIAS: 11 cm/s
RIGHT ECA DIAS: -7 cm/s
Right CCA prox sys: 86 cm/s

## 2015-11-16 MED ORDER — IOPAMIDOL (ISOVUE-300) INJECTION 61%
INTRAVENOUS | Status: AC
  Administered 2015-11-16: 80 mL
  Filled 2015-11-16: qty 150

## 2015-11-16 MED ORDER — FENTANYL CITRATE (PF) 100 MCG/2ML IJ SOLN
INTRAMUSCULAR | Status: AC | PRN
  Administered 2015-11-16 (×2): 25 ug via INTRAVENOUS

## 2015-11-16 MED ORDER — MIDAZOLAM HCL 2 MG/2ML IJ SOLN
INTRAMUSCULAR | Status: AC
  Filled 2015-11-16: qty 2

## 2015-11-16 MED ORDER — HEPARIN SODIUM (PORCINE) 1000 UNIT/ML IJ SOLN
INTRAMUSCULAR | Status: AC
  Filled 2015-11-16: qty 2

## 2015-11-16 MED ORDER — FENTANYL CITRATE (PF) 100 MCG/2ML IJ SOLN
INTRAMUSCULAR | Status: AC
  Filled 2015-11-16: qty 2

## 2015-11-16 MED ORDER — SODIUM CHLORIDE 0.9 % IV SOLN
INTRAVENOUS | Status: AC | PRN
  Administered 2015-11-16 (×2): 10 mL/h via INTRAVENOUS

## 2015-11-16 MED ORDER — LIDOCAINE HCL 1 % IJ SOLN
INTRAMUSCULAR | Status: AC
  Administered 2015-11-16: 5 mL
  Filled 2015-11-16: qty 20

## 2015-11-16 MED ORDER — LIDOCAINE HCL 1 % IJ SOLN
INTRAMUSCULAR | Status: AC | PRN
  Administered 2015-11-16: 5 mL

## 2015-11-16 MED ORDER — LIDOCAINE HCL 1 % IJ SOLN
INTRAMUSCULAR | Status: AC
  Filled 2015-11-16: qty 20

## 2015-11-16 MED ORDER — IOPAMIDOL (ISOVUE-300) INJECTION 61%
INTRAVENOUS | Status: AC
  Filled 2015-11-16: qty 50

## 2015-11-16 MED ORDER — HEPARIN SODIUM (PORCINE) 1000 UNIT/ML IJ SOLN
INTRAMUSCULAR | Status: AC | PRN
  Administered 2015-11-16: 500 [IU] via INTRAVENOUS

## 2015-11-16 MED ORDER — MIDAZOLAM HCL 2 MG/2ML IJ SOLN
INTRAMUSCULAR | Status: AC | PRN
  Administered 2015-11-16: 1 mg via INTRAVENOUS

## 2015-11-16 NOTE — Procedures (Signed)
S/P 4 vessel cerebral arteriogram. RT CFA approach. Findings . 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 2.No discrete aneurysm noted on this study.

## 2015-11-16 NOTE — Progress Notes (Signed)
PT Cancellation Note  Patient Details Name: TARELLE YANES MRN: [id number] DOB: 08/24/1945   Cancelled Treatment:    Reason Eval/Treat Not Completed: Medical issues which prohibited therapy. Pt on bed rest after angiogram. Cannot get him up untill 1725 at which point therapist will no longer be here. Expected to discharge today but will check back tomorrow if still admitted.  Benjiman Core, [place] [address][phone] 11/16/2015, 3:27 PM

## 2015-11-16 NOTE — Progress Notes (Signed)
Nurse took out IV, there were some bleeding, double gauzed and it was fine. Nurse tech escorted pt out to safety. Pt is now discharged

## 2015-11-16 NOTE — Discharge Summary (Signed)
Physician Discharge Summary  Patient ID: Timothy Maddox MRN: [id number] DOB/AGE: November 27, 1945 70 y.o.  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 male presenting to [facility] ED with speech difficulties. He states he developed numbness in his mouth and garbled speech last evening. His family states they initially were unable to understand anything he said but it cleared somewhat so they could understand some words. He states he initially had difficulty finding his words but that resolved. He did complain of a headache with the onset of symptoms. He refused to be evaluated at that time. This am his daughter called to speak with him and found him to have garbled speech again and insisted he be evaluated. He states his speech has cleared and his family states he has made significant improvement since earlier this morning. He has a history of HTN and HLD for which he takes his medicines regularly. Date last known well: 11/12/2015 Time last known well: Time: 21:00 Modified Rankin score 0 ICH score 0   Patient was admitted to the neurological intensive care unit where blood pressure was tightly controlled. Patient remained neurologically stable but did have some slurred speech and left facial weakness which persisted throughout the hospitalization. Follow-up CT scan demonstrated. CT angiogram showed no aneurysm or AVM. MRI scan of the brain also did not show any underlying structural lesion. Patient underwent diagnostic cerebral catheter angiogram 100 discharge which showed us-PLT AV fistula with drainage into the vein. Patient was in physical occupational speech therapy and had no therapy needs except for  outpatient speech therapy. He was advised to discontinue aspirin and take his blood pressure medications and have outpatient follow-up with Dr. Leonie Maddox in 2 months and with the neuroradiologist Dr. Estanislado Maddox in a few weeks to discuss elective endovascular treatment for his dural AV fistula.  Consults: neurointerventional radiology Significant Diagnostic Studies: Ct Angio Head W Or Wo Contrast  11/14/2015 CLINICAL DATA: 70 year old male with speech difficulties. EXAM: CT ANGIOGRAPHY HEAD TECHNIQUE: Multidetector CT imaging of the head was performed using the standard protocol during bolus administration of intravenous contrast. Multiplanar CT image reconstructions and MIPs were obtained to evaluate the vascular anatomy. CONTRAST: 50 cc Isovue 370. COMPARISON: MRI of the brain dated 11/13/2015, MRA of the head dated 11/13/2015, CT head dated 11/13/2015. FINDINGS: CTA HEAD Anterior circulation: Visualized upper cervical internal carotid arteries and intracranial internal carotid arteries are patent. Mild calcific atherosclerosis of the distal cavernous segments without significant stenosis. New bilateral proximal middle cerebral arteries and anterior cerebral arteries are patent. Anterior communicating artery left posterior communicating arteries are patent. The right posterior communicating artery is diminutive or absent. No large vessel occlusion, high-grade stenosis, or aneurysm of the anterior circulation is identified. In the left MCA distribution there is a relative increase in both arterial and venous structures, both superficial and deep, without a discrete vascular nidus. He the left-sided vein of Labbe may interface of the brain parenchyma in the left posterior temporal lobe (series 9, image 105). Posterior circulation: Mild calcific atherosclerosis of bilateral V4 segments without significant stenosis. Codominant vertebrobasilar system. Basilar artery is patent. Bilateral posterior cerebral arteries  are patent. No large vessel occlusion, high-grade stenosis, or aneurysm of the posterior circulation is identified.  Venous sinuses: Patent. Anatomic variants: None. Delayed phase:Left frontal parenchymal hematoma is stable in size with minimal associated surrounding vasogenic edema and local mass effect. No abnormal enhancement of the brain parenchyma. IMPRESSION: 1. No large vessel occlusion, significant stenosis, or aneurysm of the proximal circle of Willis. Mild intracranial calcific atherosclerosis. 2. Stable left frontal parenchyma hematoma. 3. Asymmetric increase in the left MCA distribution arterial and venous structures is atypical for luxury perfusion or hyperemia in the setting of recent ischemia. No discrete vascular nidus is identified to suggest an arteriovenous malformation, but there are prominent superficial cortical veins and a dural arteriovenous fistula is possible. This can be further evaluated with conventional cerebral angiogram. Electronically Signed By: Kristine Garbe M.D. On: 11/14/2015 13:28   Dg Chest 2 View  11/13/2015 CLINICAL DATA: Recent stroke EXAM: CHEST 2 VIEW COMPARISON: 08/27/2014 FINDINGS: The heart size and mediastinal contours are within normal limits. Both lungs are clear. The visualized skeletal structures are unremarkable. IMPRESSION: No active cardiopulmonary disease. Electronically Signed By: Inez Catalina M.D. On: 11/13/2015 21:30   Ct Head Wo Contrast  11/13/2015 CLINICAL DATA: Dysarthria EXAM: CT HEAD WITHOUT CONTRAST TECHNIQUE: Contiguous axial images were obtained from the base of the skull through the vertex without intravenous contrast. COMPARISON: October 23, 2004 FINDINGS: Brain: There is ventricular enlargement with sulci normal, a stable finding. There is a focal area of hemorrhage in the posterior left frontal lobe measuring 1.6 x 1.0 cm no other hemorrhage is seen. There is no well-defined mass. There is no extra-axial fluid or  midline shift. There is mild small vessel disease in the centra semiovale bilaterally. Vascular: There is no hyperdense vessel. There are foci of arterial vascular calcification cavernous carotid arteries bilaterally. Skull: The bony calvarium appears intact. Sinuses/Orbits: The visualized paranasal sinuses are clear. No intraorbital lesions are evident in the visualized orbital regions. Other: Visualized mastoid air cells are clear. IMPRESSION: Focal area of hemorrhage in the left frontal lobe. Suspect focal hemorrhagic infarct. There is mild periventricular small vessel disease, stable. There is generalized ventricular enlargement with normal appearing sulci, a chronic finding. Question a degree of normal pressure hydrocephalus. Critical Value/emergent results were called by telephone at the time of interpretation on 11/13/2015 at 2:25 pm to Dr. Lavonia Drafts , who verbally acknowledged these results. Electronically Signed By: Lowella Grip III M.D. On: 11/13/2015 14:25   Mr Brain Wo Contrast  11/13/2015 CLINICAL DATA: Stroke. Intracranial hemorrhage EXAM: MRI HEAD WITHOUT CONTRAST MRA HEAD WITHOUT CONTRAST TECHNIQUE: Multiplanar, multiecho pulse sequences of the brain and surrounding structures were obtained without intravenous contrast. Angiographic images of the head were obtained using MRA technique without contrast. COMPARISON: CT head 11/13/2015 FINDINGS: MRI HEAD FINDINGS Mild ventricular enlargement is unchanged. This is greater than expected for the degree of atrophy with minimal prominence of the sylvian fissures. Possible normal pressure hydrocephalus 10 x 15 mm hemorrhage in the left posterior frontal lobe unchanged from CT. No associated ischemic infarct in the area. No other areas of intracranial hemorrhage Negative for acute infarct. Mild chronic microvascular ischemic change in the white matter. Basal ganglia and brainstem normal. Negative for mass or edema. No shift of the midline  structures. Paranasal sinuses are clear. MRA HEAD FINDINGS Both vertebral arteries patent to the basilar. PICA patent bilaterally. Basilar patent. Superior cerebellar and posterior cerebral arteries patent bilaterally. Internal carotid artery patent bilaterally without stenosis. Anterior and middle cerebral arteries appear normal bilaterally. Patent posterior communicating artery bilaterally left greater than right. Negative for cerebral aneurysm. IMPRESSION: 10  x 15 mm acute hemorrhage left frontal lobe unchanged from CT today. No evidence of surrounding infarct. This may be due to hypertension. Amyloid is also a possibility. Ventricular enlargement is unchanged and suggestive of normal pressure hydrocephalus. Mild chronic microvascular ischemic change in the white matter Negative MRA Electronically Signed By: Franchot Gallo M.D. On: 11/13/2015 21:14   Mr Jodene Nam Head/brain Wo Cm  11/13/2015 CLINICAL DATA: Stroke. Intracranial hemorrhage EXAM: MRI HEAD WITHOUT CONTRAST MRA HEAD WITHOUT CONTRAST TECHNIQUE: Multiplanar, multiecho pulse sequences of the brain and surrounding structures were obtained without intravenous contrast. Angiographic images of the head were obtained using MRA technique without contrast. COMPARISON: CT head 11/13/2015 FINDINGS: MRI HEAD FINDINGS Mild ventricular enlargement is unchanged. This is greater than expected for the degree of atrophy with minimal prominence of the sylvian fissures. Possible normal pressure hydrocephalus 10 x 15 mm hemorrhage in the left posterior frontal lobe unchanged from CT. No associated ischemic infarct in the area. No other areas of intracranial hemorrhage Negative for acute infarct. Mild chronic microvascular ischemic change in the white matter. Basal ganglia and brainstem normal. Negative for mass or edema. No shift of the midline structures. Paranasal sinuses are clear. MRA HEAD FINDINGS Both vertebral arteries patent to the basilar. PICA patent  bilaterally. Basilar patent. Superior cerebellar and posterior cerebral arteries patent bilaterally. Internal carotid artery patent bilaterally without stenosis. Anterior and middle cerebral arteries appear normal bilaterally. Patent posterior communicating artery bilaterally left greater than right. Negative for cerebral aneurysm. IMPRESSION: 10 x 15 mm acute hemorrhage left frontal lobe unchanged from CT today. No evidence of surrounding infarct. This may be due to hypertension. Amyloid is also a possibility. Ventricular enlargement is unchanged and suggestive of normal pressure hydrocephalus. Mild chronic microvascular ischemic change in the white matter Negative MRA Electronically Signed By: Franchot Gallo M.D. On: 11/13/2015 21:14    2D Echocardiogram  - Left ventricle: The cavity size was normal. There was severe focal basal and mild concentric hypertrophy of the left ventricle. Systolic function was vigorous. The estimated ejection fraction was in the range of 65% to 70%. Wall motion was normal; there were no regional wall motion abnormalities. Doppler parameters are consistent with abnormal left ventricular relaxation (grade 1 diastolic dysfunction). There was no evidence of elevated ventricular filling pressure by Doppler parameters. - Aortic valve: Trileaflet; normal thickness leaflets. There was no regurgitation. - Aortic root: The aortic root was normal in size. - Ascending aorta: The ascending aorta was normal in size. - Mitral valve: Calcified annulus. Mildly thickened leaflets . - Left atrium: The atrium was mildly dilated. - Right ventricle: Systolic function was normal. - Right atrium: The atrium was normal in size. - Tricuspid valve: There was no regurgitation. - Pulmonary arteries: Systolic pressure was within the normal range. - Inferior vena cava: The vessel was normal in size. - Pericardium, extracardiac: There was no pericardial effusion. Impressions: No cardiac source  of emboli was indentified.  Carotid Doppler  There is no obvious evidence of hemodynamically significant internal carotid artery stenosis bilaterally. The left vertebral artery is patent with antegrade flow. Unable to visualized the right vertebral artery.     4 vessel cerebral arteriogram 11/16/15 : . RT CFA approach. Findings . 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 2.No discrete aneurysm noted on this study.  Discharge Exam: Blood pressure 141/91, pulse 51, temperature 97.7 F (36.5 C), temperature source Oral, resp. rate 11, height  6' (1.829 m), weight 259 lb 14.8 oz (117.9 kg), SpO2 97 %.    Disposition :Home  Discharge Instructions    Ambulatory referral to Neurology    Complete by:  As directed   Please schedule post stroke follow up in 2 months.     Ambulatory referral to Speech Therapy    Complete by:  As directed      Discharge instructions    Complete by:  As directed   Outpatient Speech therapy     Increase activity slowly    Complete by:  As directed            Home Discharge Medications   Medication List    STOP taking these medications        aspirin EC 81 MG tablet      TAKE these medications        amLODipine 10 MG tablet  Commonly known as:  NORVASC  Take 10 mg by mouth daily.     losartan-hydrochlorothiazide 100-12.5 MG tablet  Commonly known as:  HYZAAR  Take 1 tablet by mouth daily.     metoprolol 100 MG tablet  Commonly known as:  LOPRESSOR  Take 100 mg by mouth 2 (two) times daily.     rosuvastatin 10 MG tablet  Commonly known as:  CRESTOR  Take 10 mg by mouth daily.     spironolactone 100 MG tablet  Commonly known as:  ALDACTONE  Take 100 mg by mouth 2 (two) times daily.        If d/c to Inpatient Rehab, Medications to to continued on Rehab .  stroke: mapping our early stages of recovery book   Does not apply Once  . amLODipine  10 mg  Oral Daily  . fentaNYL      . heparin      . losartan  100 mg Oral Daily   And  . hydrochlorothiazide  12.5 mg Oral Daily  . iopamidol      . lidocaine      . metoprolol  100 mg Oral BID  . midazolam      . rosuvastatin  10 mg Oral Daily  . spironolactone  100 mg Oral BID        Follow-up Information    Follow up with Yuleimy Kretz, MD In 2 months.   Specialties:  Neurology, Radiology   Why:  Stroke Clinic, Office will call you with appointment date & time   Contact information:   [place] [address] [phone]       Follow up with Rob Hickman, MD On 11/23/2015.   Specialty:  Interventional Radiology   Why:  at 2p, come to [place], will discuss fixing small AV dural fistula   Contact information:   [address] [place] [address] [phone]       Follow up with primary care MD In 2 weeks.   Why:  for hospital follow up, get a primary MD if you do not have one      Follow up with [facility].   Specialty:  Rehabilitation   Why:  Call on Monday for appointment [phone] for outpatient Speech Therapy.  Referral has been sent.     Contact information:   [address] [place] [id number] [place] [phone]      Time spent on DC summary 32 minutes  Signed: Atiba Kimberlin 11/16/2015, 4:46 PM

## 2015-11-16 NOTE — Sedation Documentation (Signed)
ETCO2 removed per Dr. Deveshwar 

## 2015-11-16 NOTE — Progress Notes (Signed)
Physical Therapy Treatment Patient Details Name: Timothy Maddox MRN: [id number] DOB: 02-Aug-1945 Today's Date: 11/16/2015    History of Present Illness This 70 y.o. male admitted to [facility] with slurred speech and numbness in his mouth.   CT of head showed small area of bleed in Lt frontal region probably suggesting an ischemic stroke with hemorrhagic conversion.  PMH includes:  HTN, Hyperlipidemia    PT Comments    Pt does demonstrate some gait deviations that put him at a mild fall risk.  He would benefit from OP PT f/u to help normalize gait and balance and decrease fall risk.  PT to follow acutely until d/c confirmed.     Follow Up Recommendations  Outpatient PT (for balance/gait training)     Equipment Recommendations  None recommended by PT    Recommendations for Other Services   NA     Precautions / Restrictions Precautions Precautions: Fall Precaution Comments: pt is mildly unsteady on his feet.     Mobility  Bed Mobility Overal bed mobility: Modified Independent                Transfers Overall transfer level: Modified independent Equipment used: None                Ambulation/Gait Ambulation/Gait assistance: Supervision Ambulation Distance (Feet): 250 Feet Assistive device: None Gait Pattern/deviations: Step-through pattern;Staggering left;Staggering right Gait velocity: decreased Gait velocity interpretation: Below normal speed for age/gender General Gait Details: pt with more noteable balance deficits while multi tasking during gait.    Stairs Stairs: Yes Stairs assistance: Supervision Stair Management: One rail Right;One rail Left;Alternating pattern;Forwards Number of Stairs: 9 General stair comments: pt able to do stairs safely without LOB, but must use railing      Modified Rankin (Stroke Patients Only) Modified Rankin (Stroke Patients Only) Pre-Morbid Rankin Score: No symptoms Modified Rankin: Slight disability        Cognition Arousal/Alertness: Awake/alert Behavior During Therapy: WFL for tasks assessed/performed Overall Cognitive Status: Within Functional Limits for tasks assessed (not specifically tested)                             Pertinent Vitals/Pain Pain Assessment: No/denies pain        PT Goals (current goals can now be found in the care plan section) Acute Rehab PT Goals Patient Stated Goal: to go home  Progress towards PT goals: Progressing toward goals    Frequency  Min 4X/week    PT Plan Discharge plan needs to be updated;Frequency needs to be updated       End of Session Equipment Utilized During Treatment: Gait belt Activity Tolerance: Patient tolerated treatment well Patient left: in chair;with call bell/phone within reach;with family/visitor present     Time: 1731-1751 PT Time Calculation (min) (ACUTE ONLY): 20 min  Charges:  $Gait Training: 8-22 mins                      Yanira Tolsma B. Avilla, [place], DPT [phone]   11/16/2015, 6:01 PM

## 2015-11-16 NOTE — Care Management Note (Signed)
Case Management Note  Patient Details  Name: JIAN SANTALUCIA MRN: [id number] Date of Birth: 07/13/1945  Subjective/Objective:  Pt admitted on 11/13/15 with Lt frontal lobe hemorrhage.  PTA, pt resides in [place] with his girlfriend.  CM consult for OP speech therapy.                 Action/Plan: Referral placed in EPIC for OP ST at [facility] in [place].  Spoke with daughter in law, Leveda Anna, by phone; they prefer [place] area, but services not available in [place].  She states they will call for appt on Monday, and make it work.  Neuro Rehab information placed on AVS in EPIC.  Bedside nurse updated.    Expected Discharge Date:    11/16/2015              Expected Discharge Plan:  Home/Self Care  In-House Referral:     Discharge planning Services  CM Consult  Post Acute Care Choice:    Choice offered to:     DME Arranged:    DME Agency:     HH Arranged:    HH Agency:     Status of Service:  Completed, signed off  If discussed at [employer] of Stay Meetings, dates discussed:    Additional Comments:    Reinaldo Raddle, RN, BSN  Trauma/Neuro ICU Case Manager [phone]

## 2015-11-16 NOTE — Sedation Documentation (Signed)
5 Fr. Exoseal to right groin 

## 2015-11-16 NOTE — Progress Notes (Signed)
Discussed discharge instructions and medications with patient and Patiens family. Both verbalized understanding with all questions answered. Follow up appointments in place and out patient therapy for speech information given to patient from Almyra Free, Tourist information centre manager. Signs of stroke discussed and handouts provided. Patients groin site is clean,dry,and intact, level 0. DP pulse is palpable.  VSS. Pt discharged home with family.  [facility]

## 2015-11-16 NOTE — Progress Notes (Signed)
STROKE TEAM PROGRESS NOTE   SUBJECTIVE (INTERVAL HISTORY) His family  Is not at the bedside.  He is sitting up in the chair taking a bath. No new complaints. Patient had angio today  OBJECTIVE Temp:  [97.6 F (36.4 C)-98.3 F (36.8 C)] 97.7 F (36.5 C) (07/14 1535) Pulse Rate:  [47-61] 51 (07/14 1535) Cardiac Rhythm:  [-] Sinus bradycardia (07/14 1406) Resp:  [11-25] 11 (07/14 1535) BP: (129-149)/(78-112) 141/91 mmHg (07/14 1535) SpO2:  [94 %-100 %] 97 % (07/14 1535)  CBC:   Recent Labs Lab 11/13/15 1330 11/15/15 0657  WBC 6.1 5.0  NEUTROABS 3.7  --   HGB 16.0 14.7  HCT 45.4 43.3  MCV 89.0 89.1  PLT 205 [id number]    Basic Metabolic Panel:   Recent Labs Lab 11/13/15 1330 11/15/15 0657  NA 137 137  K 3.4* 3.7  CL 103 105  CO2 25 25  GLUCOSE 148* 111*  BUN 20 15  CREATININE 1.43* 1.24  CALCIUM 9.4 8.7*    Lipid Panel:     Component Value Date/Time   CHOL 101 11/14/2015 0424   TRIG 148 11/14/2015 0424   HDL 28* 11/14/2015 0424   CHOLHDL 3.6 11/14/2015 0424   VLDL 30 11/14/2015 0424   LDLCALC 43 11/14/2015 0424   HgbA1c:  Lab Results  Component Value Date   HGBA1C 6.2* 11/14/2015   Urine Drug Screen:     Component Value Date/Time   LABOPIA NONE DETECTED 11/13/2015 1359   COCAINSCRNUR NONE DETECTED 11/13/2015 1359   LABBENZ NONE DETECTED 11/13/2015 1359   AMPHETMU NONE DETECTED 11/13/2015 1359   THCU NONE DETECTED 11/13/2015 1359   LABBARB NONE DETECTED 11/13/2015 1359      IMAGING  No results found. 2D Echocardiogram  - Left ventricle: The cavity size was normal. There was severe focal basal and mild concentric hypertrophy of the left ventricle. Systolic function was vigorous. The estimated ejection fraction was in the range of 65% to 70%. Wall motion was normal; there were no regional wall motion abnormalities. Doppler parameters are consistent with abnormal left ventricular relaxation (grade 1 diastolic dysfunction). There was no evidence of  elevated ventricular filling pressure by Doppler parameters. - Aortic valve: Trileaflet; normal thickness leaflets. There was no regurgitation. - Aortic root: The aortic root was normal in size. - Ascending aorta: The ascending aorta was normal in size. - Mitral valve: Calcified annulus. Mildly thickened leaflets . - Left atrium: The atrium was mildly dilated. - Right ventricle: Systolic function was normal. - Right atrium: The atrium was normal in size. - Tricuspid valve: There was no regurgitation. - Pulmonary arteries: Systolic pressure was within the normal range. - Inferior vena cava: The vessel was normal in size. - Pericardium, extracardiac: There was no pericardial effusion. Impressions:  No cardiac source of emboli was indentified.  Carotid Doppler   There is no obvious evidence of hemodynamically significant internal carotid artery stenosis bilaterally. The left vertebral artery is patent with antegrade flow. Unable to visualized the right vertebral artery. 4 vessel cerebral arteriogram 11/16/15 : . RT CFA approach. Findings . 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 2.No discrete aneurysm noted on this study.             PHYSICAL EXAM Exam: Gen: NAD, conversant, well nourised, obese, well groomed  CV: RRR, no MRG. No Carotid Bruits. No peripheral edema, warm, nontender Eyes: Conjunctivae clear without exudates or hemorrhage  Neuro: Detailed Neurologic Exam  Speech:    Speech is dysarthric Cognition:    The patient is oriented to person, place, and time;  Cranial Nerves:    The pupils are equal, round, and reactive to light. The fundi are normal and spontaneous venous pulsations are present. Visual fields are full to finger confrontation. Extraocular movements are intact. Trigeminal sensation is intact and the muscles of mastication are normal. The face is  symmetric. The palate elevates in the midline. Hearing intact. Voice is normal. Shoulder shrug is normal. The tongue has normal motion without fasciculations.   Coordination:    Normal finger to nose.  Gait:    Heel-toe and tandem gait are normal.   Motor Observation:    No asymmetry, no atrophy, and no involuntary movements noted. Tone:    Normal muscle tone.    Strength:    Strength is V/V in the upper and lower limbs.      Sensation: intact to LT     Toes:    The toes are downgoing bilaterally.   Clonus:    Clonus is absent.    ASSESSMENT/PLAN Timothy Maddox is a 70 y.o. male with history of HTN, pre-diabetes, and HLD presenting with speech difficulties. CT shows a L frontal hemorrhage.   Stroke:  Left frontal hemorrhage  secondary to unclear source  Resultant  dysarthria, HA  MRI  L frontal lobe ICH. No infarct. ? NPH. small vessel disease   Carotid Doppler  No significant stenosis   2D Echo  EF 65-70%. No source of embolus   CT angio head no large vessel occlusion. Stable L frontal hmg. 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 Estanislado Pandy (Leonie Man spoke with him) pt made NPO - to look for source of hmg given superficial cortical veins  CXR WNL  LDL 43  HgbA1c 6.2  SCDs for VTE prophylaxis Diet NPO time specified Except for: Sips with Meds  aspirin 81 mg daily ordered PTA but was not taking routinely per him. now on No antithrombotic given hemorrhage  Ongoing aggressive stroke risk factor management  Tylenol for HA management  Resume home meds  Therapy recommendations:  No therapy needs  Disposition:  Return home (lives with girlfriend)  Needs repeat imaging (MRI) in 2-3 mos to look for underlying source of hemorrhage  Ok to transfer to the floor from stroke standpoint  Hypertension  Stable  Long-term BP goal normotensive  Hyperlipidemia  Home meds:  crestor  10 , resumed in hospital  LDL 43, goal < 70  Continue statin at discharge  Pre-Diabetes  HgbA1c 6.2 , goal < 7.0  Other Stroke Risk Factors  Advanced age  Obesity, Body mass index is 35.24 kg/(m^2)., recommend weight loss, diet and exercise as appropriate   Other Active Problems  Possible poor memory - no reported history. Follow up with family  Hypokalemia  Elevated creatinine 1.43  Hospital day # Timothy Maddox for Pager information 11/16/2015 4:41 PM  I have personally examined this patient, reviewed notes, independently viewed imaging studies, participated in medical decision making and plan of care. I have made any additions or clarifications directly to the above note. Agree with note above.  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 Plan to DC home this evening and f/u as outpatient in 2 months with me and Dr Estanislado Pandy in 4 weeks. D/W patient and Dr Estanislado Pandy Antony Contras, MD Medical Director [facility] Pager: [phone] 11/16/2015 4:41 PM   To contact Stroke Continuity provider, please refer to [email or web address] After hours, contact General Neurology

## 2015-11-19 ENCOUNTER — Other Ambulatory Visit: Payer: Self-pay

## 2015-11-19 ENCOUNTER — Telehealth: Payer: Self-pay | Admitting: Neurology

## 2015-11-19 DIAGNOSIS — I639 Cerebral infarction, unspecified: Secondary | ICD-10-CM

## 2015-11-19 NOTE — Telephone Encounter (Signed)
Ok thanks 

## 2015-11-19 NOTE — Telephone Encounter (Signed)
Jodie/Daughter in law [phone] called, states [facility] has order for Speech Therapy, no order for Physical Therapy, please send order for PT to [facility].

## 2015-11-19 NOTE — Telephone Encounter (Signed)
ORder sent via epic to [facility] per family request for physical therapy.

## 2015-11-20 ENCOUNTER — Other Ambulatory Visit (HOSPITAL_COMMUNITY): Payer: Self-pay | Admitting: Interventional Radiology

## 2015-11-20 DIAGNOSIS — I610 Nontraumatic intracerebral hemorrhage in hemisphere, subcortical: Secondary | ICD-10-CM

## 2015-11-21 ENCOUNTER — Ambulatory Visit: Payer: Medicare HMO

## 2015-11-23 ENCOUNTER — Ambulatory Visit (HOSPITAL_COMMUNITY): Payer: Medicare HMO

## 2015-11-28 ENCOUNTER — Ambulatory Visit: Payer: Medicare HMO

## 2015-11-29 ENCOUNTER — Ambulatory Visit: Payer: Medicare HMO | Attending: Neurology | Admitting: Speech Pathology

## 2015-11-29 DIAGNOSIS — R471 Dysarthria and anarthria: Secondary | ICD-10-CM | POA: Diagnosis present

## 2015-11-29 DIAGNOSIS — I611 Nontraumatic intracerebral hemorrhage in hemisphere, cortical: Secondary | ICD-10-CM | POA: Diagnosis not present

## 2015-11-29 NOTE — Patient Instructions (Signed)
   SLOW LOUD OVER-ENNUNCIATE PAUSE   PATA TAKA KAPA PATAKA  BUTTERCUP  CATERPILLAR  Orange  SLOW AND BIG - EXAGGERATE YOUR MOUTH, MAKE EACH CONSONANT  Speech exercises - do 5x each, x2-3/day SLOW BIG  SAY THE FOLLOWING- make every sound! Red leather, yellow leather  Big grocery buggy    Purple baby carriage    [facility] [place] copper coffee pot Ripe purple cabbage Three free throws [employer], Blue Bulb [employer] Dave dipped the dessert  [employer] An Chief Financial Officer for [employer] Five valve levers Six Thick Thistles Stick Double Bubble Gum Freshly Fried Fat Fish Cinnamon aluminum linoleum Black bugs blood Lovely lemon linament Buckle that Education officer, museum Takes Time A Shifty Salt Shaker   Shirts shrink, shells shouldn't [place] 49ers Take the tackle box Give me five flapjacks Call the cat "Buttercup" Yellow oil ointment Catastrophe in [place] plumbers' plums The church's chimes chimed Unique [place] A Three Toed Tree [employer] Baby Buggy Bumpers

## 2015-11-29 NOTE — Therapy (Signed)
[facility] [address] [place], [place], [address] Phone: [phone]   Fax:  [phone]  Speech Language Pathology Evaluation  Patient Details  Name: Timothy Maddox MRN: [id number] Date of Birth: 30-Aug-1945 Referring Provider: Dr. Ulyses Maddox  Encounter Date: 11/29/2015      End of Session - 11/29/15 1405    SLP Start Time 1316   SLP Stop Time  [id number]   SLP Time Calculation (min) 42 min      Past Medical History:  Diagnosis Date  . Hyperlipidemia   . Hypertension   . Prediabetes     Past Surgical History:  Procedure Laterality Date  . BACK SURGERY    . INGUINAL HERNIA REPAIR Right    per patient when he was in 1st grade  . TOE SURGERY Left     There were no vitals filed for this visit.      Subjective Assessment - 11/29/15 1328    Subjective "XXX" garbled speech   Patient is accompained by: Family member   Special Tests Timothy Maddox - daughter in law   Currently in Pain? No/denies            SLP Evaluation [facility] - 11/29/15 1328      SLP Visit Information   SLP Received On 11/29/15   Referring Provider Dr. Ulyses Maddox   Onset Date 11/13/15   Medical Diagnosis ICH     Subjective   Patient/Family Stated Goal "Get to where I can talk normal"     General Information   Behavioral/Cognition WFL   Mobility Status walks independently     Prior Functional Status   Cognitive/Linguistic Baseline Within functional limits   Type of Home Apartment    Lives With Spouse   Available Support Family   Vocation Retired     Associate Professor   Overall Cognitive Status Within Functional Limits for tasks assessed   Memory Appears intact     Auditory Comprehension   Overall Auditory Comprehension Appears within functional limits for tasks assessed     Verbal Expression   Overall Verbal Expression Appears within functional limits for tasks assessed   Interfering Components Speech intelligibility     Written  Expression   Written Expression Within Functional Limits     Oral Motor/Sensory Function   Overall Oral Motor/Sensory Function Appears within functional limits for tasks assessed     Motor Speech   Overall Motor Speech Impaired   Respiration Within functional limits   Phonation Low vocal intensity   Articulation Impaired   Level of Impairment Conversation   Intelligibility Intelligibility reduced   Conversation 50-74% accurate                         SLP Education - 11/29/15 1516    Education provided Yes   Education Details HEP for dysarthria, compensations for dysarthira, POC and ST goals   Person(s) Educated Patient;Spouse;Child(ren)   Methods Explanation;Demonstration;Verbal cues;Handout   Comprehension Verbalized understanding;Verbal cues required;Need further instruction          SLP Short Term Goals - 11/29/15 1522      SLP SHORT TERM GOAL #1   Title Pt will perform HEP for dysarthria with occasional min A   Time 4   Period Weeks   Status New     SLP SHORT TERM GOAL #2   Title Pt will utlize compensations for dysarthria in structured speech tasks with 80% accuracy and occasional min A  Time 4   Period Weeks   Status New     SLP SHORT TERM GOAL #3   Title Pt will participate in simple conversation using compensations to be 90% intellgible over 5 minutes   Time 4   Period Weeks   Status New          SLP Long Term Goals - 11/29/15 1524      SLP LONG TERM GOAL #1   Title Pt will be 95% intellgible over 12 minute moderately complex conversation with rare min A   Time 8   Period Weeks   Status New     SLP LONG TERM GOAL #2   Title Pt will be 95% intellgible in noisy environment outside of therapy room over 12 minute conversation with rare min A   Time 8   Period Weeks   Status New          Plan - 11/29/15 1517    Clinical Impression Statement [name], a 70 y.o. male suffered an ICH 11/13/15. His family called an ambulance  and he was taken the the hospital 11/14/15. Since this ICH, his speech has been "garbled" and he verbalizes frustration that people can't understand him. In this quiet environment, [name] is judged to be 70% intelligible at conversation level and I had to ask him to repeat himself usually.  Auditory comprehension, naming, and motor planning for speech are all intact. Pt presents with moderate dysarthria. He is inconsistently aware of his errors. Slow rate and over articulation are beneficial to improve intellgilbity. I recommend skilled ST to maximize intelligibility for improved indepedence, to reduce care giver burden and pt frustration.      Patient will benefit from skilled therapeutic intervention in order to improve the following deficits and impairments:   Dysarthria and anarthria - Plan: SLP plan of care cert/re-cert      G-Codes - [id number]    Functional Assessment Tool Used NOMS   Functional Limitations Motor speech   Motor Speech Current Status [phone]) At least 40 percent but less than 60 percent impaired, limited or restricted   Motor Speech Goal Status [id number]) At least 20 percent but less than 40 percent impaired, limited or restricted      Problem List Patient Active Problem List   Diagnosis Date Noted  . AVF (arteriovenous fistula) ([facility]) 11/16/2015  . ICH (intracerebral hemorrhage) ([facility]) 11/16/2015  . Stroke [facility]) 11/13/2015    Vonnie Spagnolo, Annye Rusk MS, CCC-SLP 11/29/2015, 3:27 PM  [facility] [address] [place], [place], [address] Phone: [phone]   Fax:  [phone]  Name: Timothy Maddox MRN: [id number] Date of Birth: 1945/08/05

## 2015-12-04 DIAGNOSIS — R569 Unspecified convulsions: Secondary | ICD-10-CM

## 2015-12-04 HISTORY — DX: Unspecified convulsions: R56.9

## 2015-12-05 ENCOUNTER — Ambulatory Visit (HOSPITAL_COMMUNITY)
Admission: RE | Admit: 2015-12-05 | Discharge: 2015-12-05 | Disposition: A | Discharge status: Home / Self Care | Payer: Medicare HMO | Source: Ambulatory Visit | Attending: Interventional Radiology | Admitting: Interventional Radiology

## 2015-12-05 DIAGNOSIS — I610 Nontraumatic intracerebral hemorrhage in hemisphere, subcortical: Secondary | ICD-10-CM

## 2015-12-05 HISTORY — PX: IR GENERIC HISTORICAL: IMG1180011

## 2015-12-07 ENCOUNTER — Encounter (HOSPITAL_COMMUNITY): Payer: Self-pay | Admitting: Interventional Radiology

## 2015-12-13 ENCOUNTER — Ambulatory Visit: Payer: Medicare HMO

## 2015-12-14 ENCOUNTER — Ambulatory Visit: Payer: Medicare HMO

## 2015-12-17 ENCOUNTER — Ambulatory Visit: Payer: Medicare HMO | Admitting: Speech Pathology

## 2015-12-18 ENCOUNTER — Inpatient Hospital Stay (HOSPITAL_COMMUNITY)
Admission: EM | Admit: 2015-12-18 | Discharge: 2015-12-20 | DRG: 056 | Disposition: A | Discharge status: Home / Self Care | Payer: Medicare HMO | Attending: Family Medicine | Admitting: Family Medicine

## 2015-12-18 ENCOUNTER — Emergency Department (HOSPITAL_COMMUNITY): Payer: Medicare HMO

## 2015-12-18 ENCOUNTER — Encounter (HOSPITAL_COMMUNITY): Payer: Self-pay | Admitting: Emergency Medicine

## 2015-12-18 DIAGNOSIS — R569 Unspecified convulsions: Secondary | ICD-10-CM | POA: Diagnosis not present

## 2015-12-18 DIAGNOSIS — Z79899 Other long term (current) drug therapy: Secondary | ICD-10-CM

## 2015-12-18 DIAGNOSIS — E785 Hyperlipidemia, unspecified: Secondary | ICD-10-CM | POA: Diagnosis present

## 2015-12-18 DIAGNOSIS — R7303 Prediabetes: Secondary | ICD-10-CM | POA: Diagnosis present

## 2015-12-18 DIAGNOSIS — I619 Nontraumatic intracerebral hemorrhage, unspecified: Secondary | ICD-10-CM | POA: Diagnosis present

## 2015-12-18 DIAGNOSIS — R471 Dysarthria and anarthria: Secondary | ICD-10-CM | POA: Diagnosis present

## 2015-12-18 DIAGNOSIS — I639 Cerebral infarction, unspecified: Secondary | ICD-10-CM | POA: Diagnosis present

## 2015-12-18 DIAGNOSIS — Q282 Arteriovenous malformation of cerebral vessels: Secondary | ICD-10-CM

## 2015-12-18 DIAGNOSIS — I69398 Other sequelae of cerebral infarction: Principal | ICD-10-CM

## 2015-12-18 DIAGNOSIS — I10 Essential (primary) hypertension: Secondary | ICD-10-CM | POA: Diagnosis present

## 2015-12-18 DIAGNOSIS — G9389 Other specified disorders of brain: Secondary | ICD-10-CM | POA: Diagnosis present

## 2015-12-18 HISTORY — DX: Cerebral infarction, unspecified: I63.9

## 2015-12-18 HISTORY — DX: Unspecified convulsions: R56.9

## 2015-12-18 HISTORY — DX: Unspecified hearing loss, unspecified ear: H91.90

## 2015-12-18 HISTORY — DX: Arteriovenous malformation of cerebral vessels: Q28.2

## 2015-12-18 HISTORY — DX: Unspecified osteoarthritis, unspecified site: M19.90

## 2015-12-18 LAB — CBC WITH DIFFERENTIAL/PLATELET
Basophils Absolute: 0 10*3/uL (ref 0.0–0.1)
Basophils Relative: 1 %
EOS ABS: 0.2 10*3/uL (ref 0.0–0.7)
EOS PCT: 3 %
HCT: 45.3 % (ref 39.0–52.0)
Hemoglobin: 15.5 g/dL (ref 13.0–17.0)
LYMPHS ABS: 1.5 10*3/uL (ref 0.7–4.0)
Lymphocytes Relative: 27 %
MCH: 30.9 pg (ref 26.0–34.0)
MCHC: 34.2 g/dL (ref 30.0–36.0)
MCV: 90.4 fL (ref 78.0–100.0)
MONO ABS: 0.5 10*3/uL (ref 0.1–1.0)
MONOS PCT: 9 %
Neutro Abs: 3.5 10*3/uL (ref 1.7–7.7)
Neutrophils Relative %: 60 %
PLATELETS: 202 10*3/uL (ref 150–400)
RBC: 5.01 MIL/uL (ref 4.22–5.81)
RDW: 13 % (ref 11.5–15.5)
WBC: 5.7 10*3/uL (ref 4.0–10.5)

## 2015-12-18 LAB — BASIC METABOLIC PANEL
Anion gap: 9 (ref 5–15)
BUN: 24 mg/dL — AB (ref 6–20)
CO2: 24 mmol/L (ref 22–32)
CREATININE: 1.45 mg/dL — AB (ref 0.61–1.24)
Calcium: 9.6 mg/dL (ref 8.9–10.3)
Chloride: 102 mmol/L (ref 101–111)
GFR calc Af Amer: 55 mL/min — ABNORMAL LOW (ref >60)
GFR, EST NON AFRICAN AMERICAN: 48 mL/min — AB (ref >60)
GLUCOSE: 107 mg/dL — AB (ref 65–99)
Potassium: 3.9 mmol/L (ref 3.5–5.1)
SODIUM: 135 mmol/L (ref 135–145)

## 2015-12-18 NOTE — ED Provider Notes (Signed)
[facility] DEPT Provider Note   CSN: [id number] Arrival date & time: 12/18/15  1906     History   Chief Complaint Chief Complaint  Patient presents with  . Seizures    HPI NASEAN Maddox is a 70 y.o. male.  HPI  70 year old male who presents with concern of possible seizure. 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 He states that he has been doing well since discharge from the hospital. He has been in his usual state of health, and today while sitting on the couch had questionable seizure activity. His girlfriend states that his speech became very stuttered and he couldn't say what he had wanted to stay and subsequently had tremoring of all of his extremities, left greater than right. This lasted for about 60 seconds. He subsequently returned to baseline without significant confusion, tongue biting, or urinary incontinence. He has baseline dysarthria from his intracranial hemorrhage. He denies any headaches, nausea or vomiting, new numbness or weakness, new vision or speech changes, or gait instability. No recent illnesses. Currently reports that he is at his baseline..  Past Medical History:  Diagnosis Date  . Hyperlipidemia   . Hypertension   . Prediabetes     Patient Active Problem List   Diagnosis Date Noted  . AVF (arteriovenous fistula) ([place]) 11/16/2015  . ICH (intracerebral hemorrhage) ([place]) 11/16/2015  . Stroke [facility]) 11/13/2015    Past Surgical History:  Procedure Laterality Date  . BACK SURGERY    . INGUINAL HERNIA REPAIR Right    per patient when he was in 1st grade  . IR GENERIC HISTORICAL  12/05/2015   IR RADIOLOGIST EVAL & MGMT 12/05/2015 [facility]-INTERV RAD  . TOE SURGERY Left        Home Medications    Prior to Admission medications   Medication Sig Start Date End Date Taking? Authorizing Provider  amLODipine (NORVASC) 10 MG tablet Take 10 mg by mouth daily.   Yes Historical  Provider, MD  losartan-hydrochlorothiazide (HYZAAR) 100-12.5 MG tablet Take 1 tablet by mouth daily.   Yes Historical Provider, MD  metoprolol (LOPRESSOR) 100 MG tablet Take 100 mg by mouth 2 (two) times daily. 10/28/15  Yes Historical Provider, MD  rosuvastatin (CRESTOR) 10 MG tablet Take 10 mg by mouth daily.   Yes Historical Provider, MD  spironolactone (ALDACTONE) 100 MG tablet Take 100 mg by mouth 2 (two) times daily.   Yes Historical Provider, MD    Family History Family History  Problem Relation Age of Onset  . Heart failure Father   . Diabetes Father   . Hypertension Father     Social History Social History  Substance Use Topics  . Smoking status: Never Smoker  . Smokeless tobacco: Never Used  . Alcohol use No     Allergies   Review of patient's allergies indicates no known allergies.   Review of Systems Review of Systems 10/14 systems reviewed and are negative other than those stated in the HPI   Physical Exam Updated Vital Signs BP 121/80   Pulse (!) 51   Temp 99.1 F (37.3 C) (Oral)   Resp 19   SpO2 97%   Physical Exam Physical Exam  Nursing note and vitals reviewed. Constitutional: Well developed, well nourished, non-toxic, and in no acute distress Head: Normocephalic and atraumatic.  Mouth/Throat: Oropharynx is clear and moist.  Neck: Normal range of motion. Neck supple.  Cardiovascular: Normal rate and regular rhythm.  Pulmonary/Chest: Effort normal and breath sounds normal.  Abdominal: Soft. There is no tenderness. There is no rebound and no guarding.  Musculoskeletal: Normal range of motion.  Neurological:  Alert, oriented to person, place, time, and situation. Memory grossly in tact. Fluent speech. No dysarthria or aphasia.  Cranial nerves: VF are full. Pupils are symmetric, and reactive to light. EOMI without nystagmus. No gaze deviation. Facial muscles symmetric with activation. Sensation to light touch over face in tact bilaterally. Hearing  grossly in tact. Palate elevates symmetrically. Head turn and shoulder shrug are intact. Tongue midline.  Reflexes defered.  Muscle bulk and tone normal. No pronator drift. Moves all extremities symmetrically. Sensation to light touch is in tact throughout in bilateral upper and lower extremities. Coordination reveals no dysmetria with finger to nose. Skin: Skin is warm and dry.  Psychiatric: Cooperative   ED Treatments / Results  Labs (all labs ordered are listed, but only abnormal results are displayed) Labs Reviewed  BASIC METABOLIC PANEL - Abnormal; Notable for the following:       Result Value   Glucose, Bld 107 (*)    BUN 24 (*)    Creatinine, Ser 1.45 (*)    GFR calc non Af Amer 48 (*)    GFR calc Af Amer 55 (*)    All other components within normal limits  CBG MONITORING, ED - Abnormal; Notable for the following:    Glucose-Capillary 117 (*)    All other components within normal limits  CBC WITH DIFFERENTIAL/PLATELET    EKG  EKG Interpretation None       Radiology Ct Head Wo Contrast  Result Date: 12/18/2015 CLINICAL DATA:  Seizure-like episode. Hemorrhagic infarct 1 month ago. Arterial venous malformation. EXAM: CT HEAD WITHOUT CONTRAST TECHNIQUE: Contiguous axial images were obtained from the base of the skull through the vertex without intravenous contrast. COMPARISON:  CT head without contrast 11/14/2015. MRI brain 11/13/2015. FINDINGS: Brain: Previously noted hemorrhagic infarct in the left frontal lobe demonstrates expected evolution. No residual hyperdense blood products are present. There is no new hemorrhage. Moderate periventricular white matter hypoattenuation bilaterally is otherwise stable. The ventricles are stable in size. They are proportionate to the degree of atrophy. The basal ganglia are intact. The insular ribbon is normal. Vascular: Atherosclerotic calcifications are present within the cavernous internal carotid arteries and at the dural margin of  the vertebral arteries bilaterally. No hyperdense vessels are present. Skull: The calvarium is within normal limits. Sinuses/Orbits: There is minimal fluid within a posterior lateral left ethmoid air cell. Mild mucosal thickening is scattered throughout the ethmoid air cells. Mild mucosal thickening and fluid level is present in the right frontal sinus. The remaining paranasal sinuses are clear. The mastoid air cells are clear. The globes and orbits are intact. Other: No significant extracranial soft tissue lesion is present. IMPRESSION: 1. Expected evolution of left frontal lobe infarct without evidence for recurrent or acute hemorrhage. 2. Moderate atrophy and white matter disease is stable. 3. No acute intracranial abnormality or focal lesion otherwise to explain the patient's seizure. 4. Atherosclerosis. 5. Mild sinus disease as described. This is most prominent in the right frontal sinus. Electronically Signed   By: San Morelle M.D.   On: 12/18/2015 22:24    Procedures Procedures (including critical care time)  Medications Ordered in ED Medications  levETIRAcetam (KEPPRA) 1,000 mg in sodium chloride 0.9 % 100 mL IVPB (1,000 mg Intravenous New Bag/Given 12/19/15 0018)  LORazepam (ATIVAN) 2 MG/ML injection (not administered)  LORazepam (  ATIVAN) injection 1 mg (1 mg Intravenous Given 12/19/15 0009)     Initial Impression / Assessment and Plan / ED Course  I have reviewed the triage vital signs and the nursing notes.  Pertinent labs & imaging results that were available during my care of the patient were reviewed by me and considered in my medical decision making (see chart for details).  Clinical Course    Presenting with concern for new onset seizure. Vital signs non-concerning. He is well-appearing. He has a normal neurological exam. No major electrolyte or metabolic derangements on blood work. CT head shows normal evolution of his recent intracranial hemorrhage, no new bleeding or  new intracranial processes. During observation in the ED, he does have a second seizure. He was noted to have rightward gaze with posturing and tremoring in all 4 extremities. Lasting less than 60 seconds before resolving. He did receive 1 mg of Ativan as he was coming out of the seizure. We'll be loaded with 1 g of Keppra given that this is now his second seizure. Discussed with Dr. Shon Hale from neurology who will evaluate the patient, recommending admission to hospitalist service for observation. Discussed with Dr. Lorin Mercy. Will admit for observation.  Final Clinical Impressions(s) / ED Diagnoses   Final diagnoses:  Seizure [facility])    New Prescriptions New Prescriptions   No medications on file     Forde Dandy, MD 12/19/15 0030

## 2015-12-18 NOTE — ED Triage Notes (Signed)
Pt complains of having seizure-like activity today. Pt states he did not lose consciousness, and remembers the event. Pt states it lasted a few minutes- arms shaking, couldn't catch his breath and felt his "mouth draw up". Pt had hemorrhagic stroke a few weeks ago- only deficit is speech. BP 120/60, HR 70, CBG 127

## 2015-12-19 ENCOUNTER — Encounter (HOSPITAL_COMMUNITY): Payer: Self-pay | Admitting: Internal Medicine

## 2015-12-19 ENCOUNTER — Observation Stay (HOSPITAL_BASED_OUTPATIENT_CLINIC_OR_DEPARTMENT_OTHER)
Admit: 2015-12-19 | Discharge: 2015-12-19 | Disposition: A | Discharge status: Home / Self Care | Payer: Medicare HMO | Attending: Neurology | Admitting: Neurology

## 2015-12-19 DIAGNOSIS — R471 Dysarthria and anarthria: Secondary | ICD-10-CM

## 2015-12-19 DIAGNOSIS — R569 Unspecified convulsions: Secondary | ICD-10-CM

## 2015-12-19 DIAGNOSIS — E785 Hyperlipidemia, unspecified: Secondary | ICD-10-CM | POA: Diagnosis present

## 2015-12-19 DIAGNOSIS — I10 Essential (primary) hypertension: Secondary | ICD-10-CM | POA: Diagnosis present

## 2015-12-19 DIAGNOSIS — I69398 Other sequelae of cerebral infarction: Secondary | ICD-10-CM | POA: Diagnosis not present

## 2015-12-19 DIAGNOSIS — G9389 Other specified disorders of brain: Secondary | ICD-10-CM | POA: Diagnosis present

## 2015-12-19 DIAGNOSIS — I611 Nontraumatic intracerebral hemorrhage in hemisphere, cortical: Secondary | ICD-10-CM

## 2015-12-19 DIAGNOSIS — R7303 Prediabetes: Secondary | ICD-10-CM | POA: Diagnosis present

## 2015-12-19 DIAGNOSIS — Q282 Arteriovenous malformation of cerebral vessels: Secondary | ICD-10-CM | POA: Diagnosis not present

## 2015-12-19 DIAGNOSIS — Z79899 Other long term (current) drug therapy: Secondary | ICD-10-CM | POA: Diagnosis not present

## 2015-12-19 LAB — CBG MONITORING, ED: Glucose-Capillary: 117 mg/dL — ABNORMAL HIGH (ref 65–99)

## 2015-12-19 MED ORDER — LOSARTAN POTASSIUM-HCTZ 100-12.5 MG PO TABS: 1.0000 | ORAL_TABLET | Freq: Every day | ORAL | Status: DC

## 2015-12-19 MED ORDER — ACETAMINOPHEN 325 MG PO TABS
650.0000 mg | ORAL_TABLET | ORAL | Status: DC | PRN
  Filled 2015-12-19: qty 2

## 2015-12-19 MED ORDER — ONDANSETRON HCL 4 MG/2ML IJ SOLN: 4.0000 mg | Freq: Four times a day (QID) | INTRAMUSCULAR | Status: DC | PRN

## 2015-12-19 MED ORDER — AMLODIPINE BESYLATE 10 MG PO TABS
10.0000 mg | ORAL_TABLET | Freq: Every day | ORAL | Status: DC
  Administered 2015-12-19 – 2015-12-20 (×2): 10 mg via ORAL
  Filled 2015-12-19 (×2): qty 1

## 2015-12-19 MED ORDER — ONDANSETRON HCL 4 MG PO TABS: 4.0000 mg | ORAL_TABLET | Freq: Four times a day (QID) | ORAL | Status: DC | PRN

## 2015-12-19 MED ORDER — LEVETIRACETAM 500 MG/5ML IV SOLN
500.0000 mg | Freq: Two times a day (BID) | INTRAVENOUS | Status: DC
  Administered 2015-12-19 – 2015-12-20 (×3): 500 mg via INTRAVENOUS
  Filled 2015-12-19 (×4): qty 5

## 2015-12-19 MED ORDER — ROSUVASTATIN CALCIUM 5 MG PO TABS
10.0000 mg | ORAL_TABLET | Freq: Every day | ORAL | Status: DC
  Administered 2015-12-19 – 2015-12-20 (×2): 10 mg via ORAL
  Filled 2015-12-19 (×2): qty 2

## 2015-12-19 MED ORDER — HYDROCHLOROTHIAZIDE 12.5 MG PO CAPS
12.5000 mg | ORAL_CAPSULE | Freq: Every day | ORAL | Status: DC
  Administered 2015-12-19 – 2015-12-20 (×2): 12.5 mg via ORAL
  Filled 2015-12-19 (×2): qty 1

## 2015-12-19 MED ORDER — LORAZEPAM 2 MG/ML IJ SOLN
INTRAMUSCULAR | Status: AC
  Filled 2015-12-19: qty 1

## 2015-12-19 MED ORDER — LORAZEPAM 2 MG/ML IJ SOLN
1.0000 mg | Freq: Once | INTRAMUSCULAR | Status: AC
  Administered 2015-12-19: 1 mg via INTRAVENOUS

## 2015-12-19 MED ORDER — SODIUM CHLORIDE 0.9 % IV SOLN
1000.0000 mg | Freq: Once | INTRAVENOUS | Status: AC
  Administered 2015-12-19: 1000 mg via INTRAVENOUS
  Filled 2015-12-19: qty 10

## 2015-12-19 MED ORDER — ACETAMINOPHEN 650 MG RE SUPP
650.0000 mg | RECTAL | Status: DC | PRN
Start: 2015-12-19 — End: 2015-12-20

## 2015-12-19 MED ORDER — LOSARTAN POTASSIUM 50 MG PO TABS
100.0000 mg | ORAL_TABLET | Freq: Every day | ORAL | Status: DC
  Administered 2015-12-19 – 2015-12-20 (×2): 100 mg via ORAL
  Filled 2015-12-19 (×2): qty 2

## 2015-12-19 MED ORDER — ENOXAPARIN SODIUM 60 MG/0.6ML ~~LOC~~ SOLN
60.0000 mg | SUBCUTANEOUS | Status: DC
  Administered 2015-12-19 – 2015-12-20 (×2): 60 mg via SUBCUTANEOUS
  Filled 2015-12-19 (×2): qty 0.6

## 2015-12-19 MED ORDER — SPIRONOLACTONE 25 MG PO TABS
100.0000 mg | ORAL_TABLET | Freq: Two times a day (BID) | ORAL | Status: DC
  Administered 2015-12-19 – 2015-12-20 (×3): 100 mg via ORAL
  Filled 2015-12-19 (×3): qty 4

## 2015-12-19 MED ORDER — METOPROLOL TARTRATE 100 MG PO TABS
100.0000 mg | ORAL_TABLET | Freq: Two times a day (BID) | ORAL | Status: DC
  Administered 2015-12-19 – 2015-12-20 (×2): 100 mg via ORAL
  Filled 2015-12-19 (×3): qty 1

## 2015-12-19 NOTE — H&P (Signed)
History and Physical    Timothy Maddox [id number] DOB: 03/11/1946 DOA: 12/18/2015  PCP:  Dr. Lillard Anes [facility] Consultants:  None Patient coming from: Lives with wife  Chief Complaint: seizure  HPI: Timothy Maddox is a 70 y.o. male with medical history significant of recent hospitalization (7/11-7/14) for CVA, left frontal intracerebral hemorrhage, and AV fistula which was thought to be the source of his hemorrhage.  Tonight, he had an episode at home where he seemed to have seizure activity.  In the ER, it happened again and was witnessed by family as well as by the ER physician.  In ER, he was talking to his daughter.  Got "hung like a stutter" on a word.  Chin drooped to shoulder/  Face started drawing up.  Developed generalized tonic-clonic activity.  Lasted maybe 5 minutes.  Witnessed by family and physician.  This is exactly what happened while at home today.  Lasted about 7 minutes and and then EMS came.  No tongue biting.  No loss of bowel/bladder continence.  No prior h/o seizures.  Post-ictal following both seizures.  Regular activity, normal day prior to initial seizure.  ED Course: Per Dr. Oleta Mouse:  Presenting with concern for new onset seizure. Vital signs non-concerning. He is well-appearing. He has a normal neurological exam. No major electrolyte or metabolic derangements on blood work. CT head shows normal evolution of his recent intracranial hemorrhage, no new bleeding or new intracranial processes. During observation in the ED, he does have a second seizure. He was noted to have rightward gaze with posturing and tremoring in all 4 extremities. Lasting less than 60 seconds before resolving. He did receive 1 mg of Ativan as he was coming out of the seizure. We'll be loaded with 1 g of Keppra given that this is now his second seizure. Discussed with Dr. Shon Hale from neurology who will evaluate the patient, recommending admission to hospitalist service for observation.  Discussed with Dr. Lorin Mercy. Will admit for observation.   Review of Systems: As per HPI; otherwise 10 point review of systems reviewed and negative.   Ambulatory Status:  Ambulates independently  Past Medical History:  Diagnosis Date  . CVA (cerebral infarction) 11/2015  . Hyperlipidemia   . Hypertension   . Prediabetes     Past Surgical History:  Procedure Laterality Date  . BACK SURGERY    . INGUINAL HERNIA REPAIR Right    per patient when he was in 1st grade  . IR GENERIC HISTORICAL  12/05/2015   IR RADIOLOGIST EVAL & MGMT 12/05/2015 [facility]-INTERV RAD  . TOE SURGERY Left     Social History   Social History  . Marital status: Single    Spouse name: N/A  . Number of children: N/A  . Years of education: N/A   Occupational History  . retired Curator    Social History Main Topics  . Smoking status: Never Smoker  . Smokeless tobacco: Never Used  . Alcohol use No  . Drug use: No  . Sexual activity: Not on file   Other Topics Concern  . Not on file   Social History Narrative  . No narrative on file    No Known Allergies  Family History  Problem Relation Age of Onset  . Heart failure Father   . Diabetes Father   . Hypertension Father   . Alzheimer's disease Father 92  . Other Mother 33    homicide    Prior to Admission medications   Medication  Sig Start Date End Date Taking? Authorizing Provider  amLODipine (NORVASC) 10 MG tablet Take 10 mg by mouth daily.   Yes Historical Provider, MD  losartan-hydrochlorothiazide (HYZAAR) 100-12.5 MG tablet Take 1 tablet by mouth daily.   Yes Historical Provider, MD  metoprolol (LOPRESSOR) 100 MG tablet Take 100 mg by mouth 2 (two) times daily. 10/28/15  Yes Historical Provider, MD  rosuvastatin (CRESTOR) 10 MG tablet Take 10 mg by mouth daily.   Yes Historical Provider, MD  spironolactone (ALDACTONE) 100 MG tablet Take 100 mg by mouth 2 (two) times daily.   Yes Historical Provider, MD    Physical Exam: Vitals:   12/19/15  0030 12/19/15 0100 12/19/15 0150 12/19/15 0202  BP: 115/99  (!) 120/100   Pulse: (!) 53  (!) 53   Resp: 12  20   Temp:   97.9 F (36.6 C)   TempSrc:   Oral   SpO2: 98%  95%   Weight:  117.9 kg (259 lb 14.8 oz)  126.6 kg (279 lb 1.6 oz)  Height:  6' (1.829 m)  6' (1.829 m)     General:  Appears calm and comfortable and is NAD; somewhat drowsy, not overly involved in history.  +dysarthria. Eyes:  PERRL, EOMI, normal lids, iris ENT:  Hard of hearing, lips & tongue, mmm Neck:  no LAD, masses or thyromegaly Cardiovascular:  RRR, no m/r/g. No LE edema.  Respiratory:  CTA bilaterally, no w/r/r. Normal respiratory effort. Abdomen:  soft, ntnd, NABS Skin:  no rash or induration seen on limited exam Musculoskeletal:  grossly normal tone BUE/BLE, good ROM, no bony abnormality Psychiatric:  Somewhat blunted affect, dysarthria noted Neurologic:  CN 2-12 grossly intact, moves all extremities in coordinated fashion, sensation intact  Labs on Admission: I have personally reviewed following labs and imaging studies  CBC:  Recent Labs Lab 12/18/15 2230  WBC 5.7  NEUTROABS 3.5  HGB 15.5  HCT 45.3  MCV 90.4  PLT 123XX123   Basic Metabolic Panel:  Recent Labs Lab 12/18/15 2230  NA 135  K 3.9  CL 102  CO2 24  GLUCOSE 107*  BUN 24*  CREATININE 1.45*  CALCIUM 9.6   GFR: Estimated Creatinine Clearance: 66.1 mL/min (by C-G formula based on SCr of 1.45 mg/dL). Liver Function Tests: No results for input(s): AST, ALT, ALKPHOS, BILITOT, PROT, ALBUMIN in the last 168 hours. No results for input(s): LIPASE, AMYLASE in the last 168 hours. No results for input(s): AMMONIA in the last 168 hours. Coagulation Profile: No results for input(s): INR, PROTIME in the last 168 hours. Cardiac Enzymes: No results for input(s): CKTOTAL, CKMB, CKMBINDEX, TROPONINI in the last 168 hours. BNP (last 3 results) No results for input(s): PROBNP in the last 8760 hours. HbA1C: No results for input(s): HGBA1C  in the last 72 hours. CBG:  Recent Labs Lab 12/19/15 0013  GLUCAP 117*   Lipid Profile: No results for input(s): CHOL, HDL, LDLCALC, TRIG, CHOLHDL, LDLDIRECT in the last 72 hours. Thyroid Function Tests: No results for input(s): TSH, T4TOTAL, FREET4, T3FREE, THYROIDAB in the last 72 hours. Anemia Panel: No results for input(s): VITAMINB12, FOLATE, FERRITIN, TIBC, IRON, RETICCTPCT in the last 72 hours. Urine analysis:    Component Value Date/Time   COLORURINE YELLOW (A) 11/13/2015 1357   APPEARANCEUR CLEAR (A) 11/13/2015 1357   LABSPEC 1.017 11/13/2015 1357   PHURINE 5.0 11/13/2015 1357   GLUCOSEU NEGATIVE 11/13/2015 1357   HGBUR 1+ (A) 11/13/2015 1357   BILIRUBINUR NEGATIVE 11/13/2015 1357  KETONESUR NEGATIVE 11/13/2015 1357   PROTEINUR NEGATIVE 11/13/2015 1357   NITRITE NEGATIVE 11/13/2015 1357   LEUKOCYTESUR NEGATIVE 11/13/2015 1357    Creatinine Clearance: Estimated Creatinine Clearance: 66.1 mL/min (by C-G formula based on SCr of 1.45 mg/dL).  Sepsis Labs: @LABRCNTIP (procalcitonin:4,lacticidven:4) )No results found for this or any previous visit (from the past 240 hour(s)).   Radiological Exams on Admission: Ct Head Wo Contrast  Result Date: 12/18/2015 CLINICAL DATA:  Seizure-like episode. Hemorrhagic infarct 1 month ago. Arterial venous malformation. EXAM: CT HEAD WITHOUT CONTRAST TECHNIQUE: Contiguous axial images were obtained from the base of the skull through the vertex without intravenous contrast. COMPARISON:  CT head without contrast 11/14/2015. MRI brain 11/13/2015. FINDINGS: Brain: Previously noted hemorrhagic infarct in the left frontal lobe demonstrates expected evolution. No residual hyperdense blood products are present. There is no new hemorrhage. Moderate periventricular white matter hypoattenuation bilaterally is otherwise stable. The ventricles are stable in size. They are proportionate to the degree of atrophy. The basal ganglia are intact. The insular  ribbon is normal. Vascular: Atherosclerotic calcifications are present within the cavernous internal carotid arteries and at the dural margin of the vertebral arteries bilaterally. No hyperdense vessels are present. Skull: The calvarium is within normal limits. Sinuses/Orbits: There is minimal fluid within a posterior lateral left ethmoid air cell. Mild mucosal thickening is scattered throughout the ethmoid air cells. Mild mucosal thickening and fluid level is present in the right frontal sinus. The remaining paranasal sinuses are clear. The mastoid air cells are clear. The globes and orbits are intact. Other: No significant extracranial soft tissue lesion is present. IMPRESSION: 1. Expected evolution of left frontal lobe infarct without evidence for recurrent or acute hemorrhage. 2. Moderate atrophy and white matter disease is stable. 3. No acute intracranial abnormality or focal lesion otherwise to explain the patient's seizure. 4. Atherosclerosis. 5. Mild sinus disease as described. This is most prominent in the right frontal sinus. Electronically Signed   By: San Morelle M.D.   On: 12/18/2015 22:24    EKG: not done  Assessment/Plan Principal Problem:   Seizure [facility]) Active Problems:   Stroke ([place])   ICH (intracerebral hemorrhage) (HCC)   AVM (arteriovenous malformation) brain   Seizure -Patient with 2 new-onset seizures -Dr. Shon Hale consulted -Patient loaded with Keppra and will continue 500 mg BID for now (IV until more awake and able to take PO appropriately) -For EEG in AM -Will place in Observation on telemetry -Adult seizure order set implemented  Stroke -Residual dysarthria -No indication of recurrence, CT shows improvement in [place]  AVM/fistula -For outpatient f/u    DVT prophylaxis: Lovenox Code Status:  Full - confirmed with patient/family Family Communication: Several family members present throughout  Disposition Plan:  Home once clinically improved Consults  called: Neurology  Admission status: Observation, telemetry    Karmen Bongo MD [facility]  If 7PM-7AM, please contact night-coverage [email or web address] Password [email or web address]  12/19/2015, 4:14 AM

## 2015-12-19 NOTE — ED Notes (Signed)
Admitting MD at bedside.

## 2015-12-19 NOTE — ED Notes (Signed)
Dr.Oster, neurologist, at bedside

## 2015-12-19 NOTE — Progress Notes (Signed)
Patient seen and evaluated, chart reviewed, please see EMR for updated orders. Please see full H&P dictated by admitting physician for same date of service.    New onset seizures in the setting of recent intracranial hemorrhage  Patient has seizures 2 on 12/18/2015,  Possible discharge home on 12/20/2015 and Keppra if no further seizures  Patient seen and evaluated, chart reviewed, please see EMR for updated orders. Please see full H&P dictated by admitting physician for same date of service.

## 2015-12-19 NOTE — Progress Notes (Signed)
Subjective: No further seizures  Exam: Vitals:   12/19/15 0516 12/19/15 1015  BP: (!) 101/53 120/67  Pulse: (!) 51 84  Resp: 20 18  Temp: 98.1 F (36.7 C) 97.7 F (36.5 C)   Gen: In bed, NAD Resp: non-labored breathing, no acute distress Abd: soft, nt  Neuro: MS: awake, alert, interactive.  [id number], ? mild dysarthria(states unchanged since his ICH) Motor: MAEW  EEG: negative  Impression: 70 yo M With new onset seizures 2/2 hemorrhagic infarct or AVM. He will need AED therapy and has been started on keppra.   Recommendations: 1) Keppra 500mg  BID 2) Follow up as outpatient with neurology, no further recs at thsi time. Please call with further questions.   Roland Rack, MD [facility] [phone]  If 7pm- 7am, please page neurology on call as listed in [place].

## 2015-12-19 NOTE — Consult Note (Signed)
Neurology Consult Note  Reason for Consultation: Seizure  Requesting provider: Brantley Stage, MD  CC: Seizure  HPI: This is a 70 year old right-handed man who presents to the emergency department following abnormal motor activity at home today. History is obtained from the patient. His family is present at the bedside and they offer additional information as needed. Also reviewed documentation from his ED visit tonight.  The patient was recently admitted to the [facility] from 7/11-7/14/17. He presented initially to [facility] ED with numbness around his mouth and slurred speech. Imaging revealed a left frontal intracerebral hemorrhage and he was transferred to [facility] for further management. He underwent CT angiogram and MRI scan of the brain which did not indicate any underlying structural pathology that would explain his hemorrhage. Conventional four-vessel cerebral angiogram on 11/16/15 showed a pial AV fistula in the left perisylvian region that appear to be supplied by an anterior parietal branch off of the inferior division of the left middle cerebral artery associated with an early draining vein the drain into the sphenoparietal sinus and a large cortical vein in the frontal convexity. This was felt to be the source of his hemorrhage. He was discharged home with instructions to follow up for elective endovascular treatment in 1-2 months. He and his family report that he has had persistent slurred speech since his hemorrhage.  Today, he was at home when he experienced some abnormal motor activity involving his right arm. He reports that he had uncontrolled jerking in the right arm. This was witnessed by his family who also report that he had some blinking of his eyes and some twitching of his mouth but no involvement of the right leg or the left side of his body. He was apparently responsive during this episode. The patient has good recollection of these events. He was brought to the  emergency department for evaluation. While in the ED, he had another episode that was described as right gaze deviation with possible generalized tonic-clonic seizure activity. He does not recall this event. He was given Ativan 1 mg IV and loaded with Keppra 1000 mg IV. He has no previous history of seizure activity. Currently, he feels groggy but is not endorsing any focal neurologic symptoms.  PMH:  Past Medical History:  Diagnosis Date  . CVA (cerebral infarction) 11/2015  . Hyperlipidemia   . Hypertension   . Prediabetes     PSH:  Past Surgical History:  Procedure Laterality Date  . BACK SURGERY    . INGUINAL HERNIA REPAIR Right    per patient when he was in 1st grade  . IR GENERIC HISTORICAL  12/05/2015   IR RADIOLOGIST EVAL & MGMT 12/05/2015 [facility]-INTERV RAD  . TOE SURGERY Left     Family history: Family History  Problem Relation Age of Onset  . Heart failure Father   . Diabetes Father   . Hypertension Father   . Alzheimer's disease Father 71  . Other Mother 29    homicide    Social history:  Social History   Social History  . Marital status: Single    Spouse name: N/A  . Number of children: N/A  . Years of education: N/A   Occupational History  . retired Curator    Social History Main Topics  . Smoking status: Never Smoker  . Smokeless tobacco: Never Used  . Alcohol use No  . Drug use: No  . Sexual activity: Not on file   Other Topics Concern  . Not  on file   Social History Narrative  . No narrative on file    Current outpatient meds: Current Meds  Medication Sig  . amLODipine (NORVASC) 10 MG tablet Take 10 mg by mouth daily.  Marland Kitchen losartan-hydrochlorothiazide (HYZAAR) 100-12.5 MG tablet Take 1 tablet by mouth daily.  . metoprolol (LOPRESSOR) 100 MG tablet Take 100 mg by mouth 2 (two) times daily.  . rosuvastatin (CRESTOR) 10 MG tablet Take 10 mg by mouth daily.  Marland Kitchen spironolactone (ALDACTONE) 100 MG tablet Take 100 mg by mouth 2 (two) times daily.     Current inpatient meds:  Current Facility-Administered Medications  Medication Dose Route Frequency Provider Last Rate Last Dose  . acetaminophen (TYLENOL) tablet 650 mg  650 mg Oral Q4H PRN Karmen Bongo, MD       Or  . acetaminophen (TYLENOL) suppository 650 mg  650 mg Rectal Q4H PRN Karmen Bongo, MD      . amLODipine (NORVASC) tablet 10 mg  10 mg Oral Daily Karmen Bongo, MD      . enoxaparin (LOVENOX) injection 60 mg  60 mg Subcutaneous Q24H Karmen Bongo, MD      . losartan (COZAAR) tablet 100 mg  100 mg Oral Daily Karmen Bongo, MD       And  . hydrochlorothiazide (MICROZIDE) capsule 12.5 mg  12.5 mg Oral Daily Karmen Bongo, MD      . levETIRAcetam (KEPPRA) 500 mg in sodium chloride 0.9 % 100 mL IVPB  500 mg Intravenous Q12H Karmen Bongo, MD      . metoprolol (LOPRESSOR) tablet 100 mg  100 mg Oral BID Karmen Bongo, MD      . ondansetron [facility]) tablet 4 mg  4 mg Oral Q6H PRN Karmen Bongo, MD       Or  . ondansetron [facility]) injection 4 mg  4 mg Intravenous Q6H PRN Karmen Bongo, MD      . rosuvastatin (CRESTOR) tablet 10 mg  10 mg Oral Daily Karmen Bongo, MD      . spironolactone (ALDACTONE) tablet 100 mg  100 mg Oral BID Karmen Bongo, MD        Allergies: No Known Allergies  ROS: As per HPI. A full 14-point review of systems was performed and is otherwise unremarkable.   PE:  BP (!) 120/100 (BP Location: Left Arm)   Pulse (!) 53   Temp 97.9 F (36.6 C) (Oral)   Resp 20   Ht 6' (1.829 m)   Wt 126.6 kg (279 lb 1.6 oz)   SpO2 95%   BMI 37.85 kg/m   General: WDWN resting on ED gurney. He appears drowsy but alert. He is fully oriented. He has mild to moderate dysarthria. No aphasia. Follows commands briskly. Affect is bright with congruent mood. Comportment is normal.  HEENT: Normocephalic. Neck supple without LAD. MMM, OP clear. Dentition good. Sclerae anicteric. He has mild conjunctival injection in both eyes.  CV: Distant, regular, no obvious  murmur. Carotid pulses full and symmetric, no bruits. Distal pulses 2+ and symmetric.  Lungs: CTAB on anterior exam.  Abdomen: Soft, obese, non-distended, non-tender. Bowel sounds present x4.  Extremities: No C/C/E. Neuro:  CN: Pupils are equal and round. They are symmetrically reactive from 3-->2 mm. EOMI without nystagmus. No reported diplopia. Facial sensation is intact to light touch. Face is symmetric at rest with normal strength and mobility. Hearing is intact to conversational voice. Palate elevates symmetrically and uvula is midline. Voice is normal in tone, pitch and quality. Bilateral SCM  and trapezii are 5/5. Tongue is midline with normal bulk and mobility.  Motor: Normal bulk, tone, and strength. He has some mild slowing of movement in the right arm. No tremor or other abnormal movements. No drift.  Sensation: Intact to light touch.  DTRs: 2+, symmetric.  Coordination: Finger-to-nose is without dysmetria, slightly slower and more deliberate on the left. Finger taps are slow bilaterally.    Labs:  Lab Results  Component Value Date   WBC 5.7 12/18/2015   HGB 15.5 12/18/2015   HCT 45.3 12/18/2015   PLT 202 12/18/2015   GLUCOSE 107 (H) 12/18/2015   CHOL 101 11/14/2015   TRIG 148 11/14/2015   HDL 28 (L) 11/14/2015   LDLCALC 43 11/14/2015   ALT 25 11/13/2015   AST 24 11/13/2015   NA 135 12/18/2015   K 3.9 12/18/2015   CL 102 12/18/2015   CREATININE 1.45 (H) 12/18/2015   BUN 24 (H) 12/18/2015   CO2 24 12/18/2015   INR 0.93 11/13/2015   HGBA1C 6.2 (H) 11/14/2015    Imaging:  I've reviewed the radiologist's report of CT scan of the head without contrast from 12/18/15.  I've also personally and independently reviewed the CT scan of the head without contrast from 12/18/15. This was directly compared to a previous CT scan dated 11/13/15 and to a MRI scan dated 11/13/15. There is a focal area of hypodensity in the left frontal lobe seen best on image #22, residual from his recent  intraparenchymal hemorrhage in the same area. Hemorrhage is no longer seen. He has moderate chronic small vessel ischemic changes in the bihemispheric white matter. Ventricles are large, with the third ventricle measuring approximately 11 mm at its greatest diameter. When compared to his previous scan, this has decreased in size slightly. He has calcification of the intracranial arteries with some ectasia of the basilar. No acute abnormality is seen.  Assessment and Plan:  1. New onset seizure: History as reported suggests that he had a simple partial seizure affecting the right face and arm earlier today. This would be compatible with the known left frontal hemorrhage that he suffered just over one month ago. In the emergency department, he had a second seizure, this time with impairment in consciousness as well as possibly generalized motor activity. This is more concerning for partial onset seizure with secondary generalization. I agree with Keppra and recommend continued treatment with 500 mg twice daily for 5 days then increasing to 1000 mg twice daily thereafter. I will check EEG in a.m. Seizure precautions were discussed with the patient and his family as follows.  Per [facility] statutes, patients with seizures are not allowed to drive until they have been seizure-free for six months. Use caution when using heavy equipment or power tools. Avoid working on ladders or at heights. Take showers instead of baths. Ensure the water temperature is not too high on the home water heater. Do not go swimming alone. When caring for infants or small children, sit down when holding, feeding, or changing them to minimize risk of injury to the child in the event you have a seizure.   2. Intracerebral hemorrhage: He suffered a recent left frontal hemorrhage presumed to be due to an associated AVM. His hemorrhage has resolved on imaging but has left him with an apparent area of encephalomalacia which is the  likely source of his seizure activity tonight. No acute intervention necessary regarding his hemorrhage at this time.  3. 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 He is to follow-up with Dr. Estanislado Pandy in the next few weeks for possible elective endovascular treatment of his lesion. No acute issues at this time.  4. Dysarthria: He has residual dysarthria from his recent hemorrhage. This is relatively mild at his baseline, more pronounced at the time of my visit because he is just gotten some Ativan and a load of Keppra resulting in some sedation. This can be observed.  This was discussed with the patient and his family. They're in agreement with the plan as stated. There are given the opportunity to ask any questions and these were addressed to their satisfaction. Neurology will continue to follow.

## 2015-12-19 NOTE — ED Notes (Signed)
Pt from home with wife for a seizure like activity at home. Pt had recent CVA with speech deficits. Pt had an additional seizure last 30-45 seconds with decreased sats and R sided gaze. Pt postical following episode, is able to follow commands and answer questions at this time. Family at bedside. Loading dose of keppra and ativan 1mg  given. --Illene Silver RN [phone]

## 2015-12-19 NOTE — Progress Notes (Deleted)
Amiodarone Drug - Drug Interaction Consult Note  Recommendations: Pt is on crestor increasing his risk of myopathy, metoprolol increasing his risk of bradycardia and HCTZ increasing risk of hypokalemia. Monitor closely. Amiodarone is metabolized by the cytochrome P450 system and therefore has the potential to cause many drug interactions. Amiodarone has an average plasma half-life of 50 days (range 20 to 100 days).   There is potential for drug interactions to occur several weeks or months after stopping treatment and the onset of drug interactions may be slow after initiating amiodarone.   [x]  Statins: Increased risk of myopathy. Simvastatin- restrict dose to 20mg  daily. Other statins: counsel patients to report any muscle pain or weakness immediately.  []  Anticoagulants: Amiodarone can increase anticoagulant effect. Consider warfarin dose reduction. Patients should be monitored closely and the dose of anticoagulant altered accordingly, remembering that amiodarone levels take several weeks to stabilize.  []  Antiepileptics: Amiodarone can increase plasma concentration of phenytoin, the dose should be reduced. Note that small changes in phenytoin dose can result in large changes in levels. Monitor patient and counsel on signs of toxicity.  [x]  Beta blockers: increased risk of bradycardia, AV block and myocardial depression. Sotalol - avoid concomitant use.  []   Calcium channel blockers (diltiazem and verapamil): increased risk of bradycardia, AV block and myocardial depression.  []   Cyclosporine: Amiodarone increases levels of cyclosporine. Reduced dose of cyclosporine is recommended.  []  Digoxin dose should be halved when amiodarone is started.  [x]  Diuretics: increased risk of cardiotoxicity if hypokalemia occurs.  []  Oral hypoglycemic agents (glyburide, glipizide, glimepiride): increased risk of hypoglycemia. Patient's glucose levels should be monitored closely when initiating amiodarone  therapy.   []  Drugs that prolong the QT interval:  Torsades de pointes risk may be increased with concurrent use - avoid if possible.  Monitor QTc, also keep magnesium/potassium WNL if concurrent therapy can't be avoided. Marland Kitchen Antibiotics: e.g. fluoroquinolones, erythromycin. . Antiarrhythmics: e.g. quinidine, procainamide, disopyramide, sotalol. . Antipsychotics: e.g. phenothiazines, haloperidol.  . Lithium, tricyclic antidepressants, and methadone.  Andrey Cota. Diona Foley, PharmD, [place] Clinical Pharmacist Pager [phone]  12/19/2015 10:22 AM

## 2015-12-19 NOTE — Progress Notes (Signed)
EEG Completed; Results Pending  

## 2015-12-19 NOTE — Care Management Note (Signed)
Case Management Note  Patient Details  Name: Timothy Maddox MRN: [id number] Date of Birth: [birth year]  Subjective/Objective:   Pt in with seizures. He is from home with his girlfriend.                  Action/Plan: CM following for discharge needs.   Expected Discharge Date:                  Expected Discharge Plan:  Home/Self Care  In-House Referral:     Discharge planning Services     Post Acute Care Choice:    Choice offered to:     DME Arranged:    DME Agency:     HH Arranged:    HH Agency:     Status of Service:  In process, will continue to follow  If discussed at Long Length of Stay Meetings, dates discussed:    Additional Comments:  Pollie Friar, RN 12/19/2015, 11:07 AM

## 2015-12-19 NOTE — ED Notes (Signed)
Family called out from room for pt having seizure like activity. Pt with R sided gaze, no incontinence, episode lasted ~30-45 seconds, sats decreased to 89%. NRB placed, sats improved to 96%. Pt postical. Dr.Ward at bedside. Ativan given per verbal order.

## 2015-12-19 NOTE — Procedures (Signed)
ELECTROENCEPHALOGRAM REPORT  Date of Study: 12/18/2105  Patient's Name: Timothy Maddox MRN: [id number] Date of Birth: [birth year]  Referring Provider: Melba Coon, MD  Clinical History: 70 year old man with new-onset seizure  Medications: L1 acetaminophen (TYLENOL) suppository 650 mg  L1 acetaminophen (TYLENOL) tablet 650 mg   amLODipine (NORVASC) tablet 10 mg   enoxaparin (LOVENOX) injection 60 mg  L2 hydrochlorothiazide (MICROZIDE) capsule 12.5 mg   levETIRAcetam (KEPPRA) 500 mg in sodium chloride 0.9 % 100 mL IVPB  L2 losartan (COZAAR) tablet 100 mg   metoprolol (LOPRESSOR) tablet 100 mg  L3 ondansetron (ZOFRAN) injection 4 mg  L3 ondansetron (ZOFRAN) tablet 4 mg   rosuvastatin (CRESTOR) tablet 10 mg   spironolactone (ALDACTONE) tablet 100 mg  Outpatient Medications  amLODipine (NORVASC) 10 MG tablet   losartan-hydrochlorothiazide (HYZAAR) 100-12.5 MG tablet   metoprolol (LOPRESSOR) 100 MG tablet   rosuvastatin (CRESTOR) 10 MG tablet   spironolactone (ALDACTONE) 100 MG tablet  Technical Summary: A multichannel digital EEG recording measured by the international 10-20 system with electrodes applied with paste and impedances below 5000 ohms performed in our laboratory with EKG monitoring in an awake and asleep patient.  Hyperventilation and photic stimulation were not performed.  The digital EEG was referentially recorded, reformatted, and digitally filtered in a variety of bipolar and referential montages for optimal display.    Description: The patient is awake and asleep during the recording.  During maximal wakefulness, there is a symmetric, medium voltage 8 Hz posterior dominant rhythm that attenuates with eye opening.  The record is symmetric.  During drowsiness and sleep, there is an increase in theta slowing of the background.  Vertex waves and symmetric sleep spindles were seen.  There were no epileptiform discharges or electrographic seizures seen.    EKG lead was  unremarkable.  Impression: This awake and asleep EEG is normal.    Clinical Correlation: A normal EEG does not exclude a clinical diagnosis of epilepsy.  If further clinical questions remain, prolonged EEG may be helpful.  Clinical correlation is advised.   Metta Clines, DO

## 2015-12-19 NOTE — Progress Notes (Signed)
Patient arrived from the ED to 5M06 accompanied by family at this time. Safety precautions and orders revewed. SZ pads applied. TELE applied and confirmed. Pt denied any pain or any distress. Cont pulse ox applied. Will continue to monitor.   Ave Filter, RN

## 2015-12-20 MED ORDER — METOPROLOL TARTRATE 50 MG PO TABS: ORAL_TABLET | ORAL | 1 refills | Status: DC

## 2015-12-20 MED ORDER — LOSARTAN POTASSIUM 100 MG PO TABS: 100.0000 mg | ORAL_TABLET | Freq: Every day | ORAL | 1 refills | Status: DC

## 2015-12-20 MED ORDER — LEVETIRACETAM 500 MG PO TABS: 500.0000 mg | ORAL_TABLET | Freq: Two times a day (BID) | ORAL | 3 refills | Status: DC

## 2015-12-20 NOTE — Progress Notes (Signed)
Patient is being dc home. Dc instructions given and patient verbalized understanding. Daughter called to notify.

## 2015-12-20 NOTE — Discharge Summary (Signed)
Timothy Maddox, is a 70 y.o. male  DOB 22-Apr-1946  MRN [id number].  Admission date:  12/18/2015  Admitting Physician  Karmen Bongo, MD  Discharge Date:  12/20/2015   Primary MD  No primary care provider on file.  Recommendations for primary care physician for things to follow:     Admission Diagnosis  Seizure ([place]) [R56.9]   Discharge Diagnosis  Seizure ([place]) [R56.9]   Principal Problem:   Seizure ([place]) Active Problems:   Stroke ([place])   ICH (intracerebral hemorrhage) ([place])   AVM (arteriovenous malformation) brain      Past Medical History:  Diagnosis Date  . Arthritis    lower back shoulders  . AVM (arteriovenous malformation) brain   . CVA (cerebral infarction) 11/2015  . Family history of adverse reaction to anesthesia   . HOH (hard of hearing)   . Hyperlipidemia   . Hypertension   . Prediabetes   . Seizures ([place])   . Stroke [facility])     Past Surgical History:  Procedure Laterality Date  . BACK SURGERY    . INGUINAL HERNIA REPAIR Right    per patient when he was in 1st grade  . IR GENERIC HISTORICAL  12/05/2015   IR RADIOLOGIST EVAL & MGMT 12/05/2015 [facility]-INTERV RAD  . KNEE SURGERY Bilateral 2015  . TOE SURGERY Left        HPI  from the history and physical done on the day of admission:    HPI: Timothy Maddox is a 70 y.o. male with medical history significant of recent hospitalization (7/11-7/14) for CVA, left frontal intracerebral hemorrhage, and AV fistula which was thought to be the source of his hemorrhage.  Tonight, he had an episode at home where he seemed to have seizure activity.  In the ER, it happened again and was witnessed by family as well as by the ER physician.  In ER, he was talking to his daughter.  Got "hung like a stutter" on a word.  Chin drooped to shoulder/  Face started drawing up.  Developed generalized tonic-clonic activity.  Lasted maybe 5 minutes.   Witnessed by family and physician.  This is exactly what happened while at home today.  Lasted about 7 minutes and and then EMS came.  No tongue biting.  No loss of bowel/bladder continence.  No prior h/o seizures.  Post-ictal following both seizures.  Regular activity, normal day prior to initial seizure.  ED Course: Per Dr. Oleta Mouse:  Presenting with concern for new onset seizure. Vital signs non-concerning. He is well-appearing. He has a normal neurological exam. No major electrolyte or metabolic derangements on blood work. CT head shows normal evolution of his recent intracranial hemorrhage, no new bleeding or new intracranial processes. During observation in the ED, he does have a second seizure. He was noted to have rightward gaze with posturing and tremoring in all 4 extremities. Lasting less than 60 seconds before resolving. He did receive 1 mg of Ativan as he was coming out of the seizure. We'll be  loaded with 1 g of Keppra given that this is now his second seizure. Discussed with Dr. Shon Hale from neurology who will evaluate the patient, recommending admission to hospitalist service for observation. Discussed with Dr. Lorin Mercy. Will admit for observation     Hospital Course:    1)New Onset Sz- Patient with new onset simple partial seizures affecting the right side of his face and arm, this is compatible with previously known left frontal hemorrhage that patient suffered over 5 weeks ago.  No further seizures on Keppra, d/c home on keppra.  neurology consult appreciated, EEG and neuro imaging studies without new acute findings. Per [facility] statutes, patients with seizures are not allowed to drive until they have been seizure-free for six months. Use caution when using heavy equipment or power tools. Avoid working on ladders or at heights. Take showers instead of baths. Ensure the water temperature is not too high on the home water heater. Do not go swimming alone. When caring for infants or  small children, sit down when holding, feeding, or changing them to minimize risk of injury to the child in the event you have a seizure.   2)HTN/H/o ICH- continue Crestor,  losartan HCTZ amlodipine and Aldactone. Some dysarthria  secondary to recent stroke, apparently unchanged from his recent baseline. Decrease metoprolol to 50 mg by mouth twice a day from 100 g twice a day due to bradycardia  3) Lt Frontal AVM -follow-up with Dr. Corena Pilgrim nextfew weeks for possible elective endovascular treatment of this lesion   Discharge Condition: Stable, without hypoxia, O2 sats 97-90% on room air at rest and post ambulation  Follow UP  Follow-up Information    Darrel Reach, MD. Schedule an appointment as soon as possible for a visit in 2 week(s).   Specialty:  Neurology Contact information: [place] [address] [phone]        Rob Hickman, MD. Schedule an appointment as soon as possible for a visit in 1 month(s).   Specialty:  Interventional Radiology Why:  Left frontal AVM needs endovascular repair Contact information: [address] [place] [address] [phone]            Consults obtained - Neurology  Diet and Activity recommendation:  As advised  Discharge Instructions    Discharge Instructions    Call MD for:  difficulty breathing, headache or visual disturbances    Complete by:  As directed   Diet - low sodium heart healthy    Complete by:  As directed   Diet - low sodium heart healthy    Complete by:  As directed   Discharge instructions    Complete by:  As directed   Per [facility] statutes, patients with seizures are not allowed to drive until they have been seizure-free for six months. Use caution when using heavy equipment or power tools. Avoid working on ladders or at heights. Take showers instead of baths. Ensure the water temperature is not too high on the home water heater. Do not go swimming  alone. When caring for infants or small children, sit down when holding, feeding, or changing them to minimize risk of injury to the child in the event you have a seizure.   Discharge instructions    Complete by:  As directed   Decrease metoprolol to 50 mg twice a day from 100 mg due to bradycardia  Follow-up with Dr. Ihor Gully for possible endovascular repair of left  frontal arteriovenous malformation   Increase activity slowly    Complete by:  As directed   Increase activity slowly    Complete by:  As directed        Discharge Medications       Medication List    TAKE these medications   amLODipine 10 MG tablet Commonly known as:  NORVASC Take 10 mg by mouth daily.   levETIRAcetam 500 MG tablet Commonly known as:  KEPPRA Take 1 tablet (500 mg total) by mouth 2 (two) times daily.   losartan 100 MG tablet Commonly known as:  COZAAR Take 1 tablet (100 mg total) by mouth daily.   losartan-hydrochlorothiazide 100-12.5 MG tablet Commonly known as:  HYZAAR Take 1 tablet by mouth daily.   metoprolol 50 MG tablet Commonly known as:  LOPRESSOR Take 50 mg tablet twice a day What changed:  medication strength  how much to take  how to take this  when to take this  additional instructions   rosuvastatin 10 MG tablet Commonly known as:  CRESTOR Take 10 mg by mouth daily.   spironolactone 100 MG tablet Commonly known as:  ALDACTONE Take 100 mg by mouth 2 (two) times daily.       Major procedures and Radiology Reports - PLEASE review detailed and final reports for all details, in brief -  Ct Head Wo Contrast  Result Date: 12/18/2015 CLINICAL DATA:  Seizure-like episode. Hemorrhagic infarct 1 month ago. Arterial venous malformation. EXAM: CT HEAD WITHOUT CONTRAST TECHNIQUE: Contiguous axial images were obtained from the base of the skull through the vertex without intravenous contrast. COMPARISON:  CT head without contrast 11/14/2015. MRI brain 11/13/2015. FINDINGS:  Brain: Previously noted hemorrhagic infarct in the left frontal lobe demonstrates expected evolution. No residual hyperdense blood products are present. There is no new hemorrhage. Moderate periventricular white matter hypoattenuation bilaterally is otherwise stable. The ventricles are stable in size. They are proportionate to the degree of atrophy. The basal ganglia are intact. The insular ribbon is normal. Vascular: Atherosclerotic calcifications are present within the cavernous internal carotid arteries and at the dural margin of the vertebral arteries bilaterally. No hyperdense vessels are present. Skull: The calvarium is within normal limits. Sinuses/Orbits: There is minimal fluid within a posterior lateral left ethmoid air cell. Mild mucosal thickening is scattered throughout the ethmoid air cells. Mild mucosal thickening and fluid level is present in the right frontal sinus. The remaining paranasal sinuses are clear. The mastoid air cells are clear. The globes and orbits are intact. Other: No significant extracranial soft tissue lesion is present. IMPRESSION: 1. Expected evolution of left frontal lobe infarct without evidence for recurrent or acute hemorrhage. 2. Moderate atrophy and white matter disease is stable. 3. No acute intracranial abnormality or focal lesion otherwise to explain the patient's seizure. 4. Atherosclerosis. 5. Mild sinus disease as described. This is most prominent in the right frontal sinus. Electronically Signed   By: San Morelle M.D.   On: 12/18/2015 22:24   Ir Radiologist Eval & Mgmt  Result Date: 12/07/2015 EXAM: ESTABLISHED PATIENT OFFICE VISIT CHIEF COMPLAINT: Aphasia and dysarthria. Current Pain Level: 1-10 HISTORY OF PRESENT ILLNESS: The patient is a 70 year old right-handed gentleman who was recently admitted with a left anterior opercular focal hemorrhage on 11/16/2015. The patient underwent a formal catheter angiogram which revealed the presence of a fast flow  arteriovenous shunting in the region involving the left perinsular region associated with venous engorgement. The patient returns today accompanied by his son to  discuss the findings of the arteriogram. Since the discharge, the patient has had no symptoms of motor weakness, sensory symptoms, incoordination, seizures or loss of consciousness. His speech has improved slowly but progressively to the point where his words are clear and his output has also increased. He is scheduled to undergo speech therapy in a day or two. The patient is, otherwise, able to take care of most of his chores at home independently. He lives with his girlfriend. He denies any visual symptoms. The patient's past medical history, family history is as per the time of his discharge unchanged. His present medications: Amlodipine, losartan hydrochlorothiazide, metoprolol, Crestor, and spironolactone. The patient has no known allergies. He denies smoking cigarettes or drinking alcohol or using illicit chemicals. His review of systems is, otherwise, negative for pathologic symptomatology. PHYSICAL EXAMINATION: In no acute distress. Speech obviously dysarthric. Improved output of word. Minimal right nasal labial fold flattening. ASSESSMENT AND PLAN: The patient's angiographic findings were reviewed with him and his son. The angiogram detected fast flow arteriovenous shunting unclear whether this represented a fistulous communication, and less likely an arteriovenous malformation as no nidus was angiographically discernible at the time of the arteriogram. The plan is for the patient to undergo a CT scan of the brain without infusion in approximately 2 months time when he meets with his neurologist Dr. Leonie Man. Following that if the CT scan appears to show a complete resolution of the hemorrhage, the patient is to undergo a catheter arteriogram under anesthesia with possible endovascular obliteration of this arteriovenous communication if safely  possible to do so. This was explained to him and also to his son. They were reminded to call us after having seen Dr. Leonie Man, his neurologist. Questions were answered to their satisfaction. They were asked to call should they have any concerns or questions. Electronically Signed   By: [name] M.D.   On: 12/06/2015 15:33    Micro Results  No results found for this or any previous visit (from the past 240 hour(s)).     Today   Subjective    Malo Prayer today has no New concerns or complaints. No further seizures,          Patient has been seen and examined prior to discharge   Objective   Blood pressure 124/63, pulse (!) 51, temperature 98.4 F (36.9 C), temperature source Oral, resp. rate 18, height 6' (1.829 m), weight 126.6 kg (279 lb 1.6 oz), SpO2 97 %.   Intake/Output Summary (Last 24 hours) at 12/20/15 1444 Last data filed at 12/20/15 0115  Gross per 24 hour  Intake              220 ml  Output             1100 ml  Net             -880 ml    Exam Gen:- Awake  In no apparent distress  HEENT:- [place].AT,   Neck-Supple Neck,No JVD,  Lungs- mostly clear  CV- S1, S2 normal Abd-  +ve B.Sounds, Abd Soft, No tenderness,    Extremity/Skin:- Intact peripheral pulses   Neuro- dysarthria from recent stroke, no additional new deficits   Data Review   CBC w Diff:  Lab Results  Component Value Date   WBC 5.7 12/18/2015   HGB 15.5 12/18/2015   HGB 14.6 08/27/2014   HCT 45.3 12/18/2015   HCT 42.8 08/27/2014   PLT 202 12/18/2015   PLT 214 08/27/2014  LYMPHOPCT 27 12/18/2015   MONOPCT 9 12/18/2015   EOSPCT 3 12/18/2015   BASOPCT 1 12/18/2015    CMP:  Lab Results  Component Value Date   NA 135 12/18/2015   NA 141 08/27/2014   K 3.9 12/18/2015   K 3.0 (L) 08/27/2014   CL 102 12/18/2015   CL 102 08/27/2014   CO2 24 12/18/2015   CO2 32 08/27/2014   BUN 24 (H) 12/18/2015   BUN 14 08/27/2014   CREATININE 1.45 (H) 12/18/2015   CREATININE 0.95 08/27/2014    PROT 7.5 11/13/2015   ALBUMIN 4.4 11/13/2015   BILITOT 0.4 11/13/2015   ALKPHOS 39 11/13/2015   AST 24 11/13/2015   ALT 25 11/13/2015  .   Total Discharge time is about 33 minutes  Anjelica Gorniak M.D on 12/20/2015 at 2:44 PM  [facility]   Office  [phone]  Dragon dictation system was used to create this note, attempts have been made to correct errors, however presence of uncorrected errors is not a reflection quality of care provided

## 2015-12-20 NOTE — Care Management Note (Signed)
Case Management Note  Patient Details  Name: Timothy Maddox MRN: [id number] Date of Birth: 10/10/1945  Subjective/Objective:                    Action/Plan: Pt discharging home with girlfriend. No further needs per CM.   Expected Discharge Date:                  Expected Discharge Plan:  Home/Self Care  In-House Referral:     Discharge planning Services     Post Acute Care Choice:    Choice offered to:     DME Arranged:    DME Agency:     HH Arranged:    [place] Agency:     Status of Service:  Completed, signed off  If discussed at [employer] of Stay Meetings, dates discussed:    Additional Comments:  Pollie Friar, RN 12/20/2015, 3:28 PM

## 2015-12-21 ENCOUNTER — Ambulatory Visit: Payer: Medicare HMO

## 2015-12-27 ENCOUNTER — Ambulatory Visit: Payer: Medicare HMO | Attending: Neurology | Admitting: Speech Pathology

## 2015-12-28 ENCOUNTER — Ambulatory Visit: Payer: Medicare HMO

## 2016-01-01 ENCOUNTER — Ambulatory Visit: Payer: Medicare HMO

## 2016-01-03 ENCOUNTER — Encounter: Payer: Medicare HMO | Admitting: Speech Pathology

## 2016-01-04 ENCOUNTER — Encounter: Payer: Self-pay | Admitting: Neurology

## 2016-01-04 ENCOUNTER — Ambulatory Visit (INDEPENDENT_AMBULATORY_CARE_PROVIDER_SITE_OTHER): Payer: Medicare HMO | Admitting: Neurology

## 2016-01-04 VITALS — BP 123/77 | HR 57 | Ht 72.0 in | Wt 278.8 lb

## 2016-01-04 DIAGNOSIS — R569 Unspecified convulsions: Secondary | ICD-10-CM

## 2016-01-04 NOTE — Patient Instructions (Signed)
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 I recommend he stay on Keppra 500 mg twice daily which is tolerating well without side effects. I advised him to avoid seizure provoking stimuli like sleep deprivation, medication noncompliance and alcohol use. I also encouraged him to see Dr. Estanislado Pandy to discuss endovascular treatment for his AVM. Also recommend repeat EEG study.Per [facility] statutes, patients with seizures are not allowed to drive until they have been seizure-free for six months. Use caution when using heavy equipment or power tools. Avoid working on ladders or at heights. Take showers instead of baths. Ensure the water temperature is not too high on the home water heater. Do not go swimming alone. When caring for infants or small children, sit down when holding, feeding, or changing them to minimize risk of injury to the child in the event you have a seizure. Return for follow-up to see me in 3 months or call earlier if necessary.  Brain Arteriovenous Malformation A brain arteriovenous malformation (AVM) is a condition that means your arteries and veins are tangled. Your veins bring blood to your heart and lungs. Your arteries bring blood away from your heart and to your brain. If they are tangled, your blood is not able to travel to where it is needed. Brain AVM may also lead to bleeding in your brain (hemorrhage), which can be life-threatening. CAUSES It is not known what causes brain AVM. Most brain AVMs are present since birth (congenital). RISK FACTORS Risk factors for brain AVM include being male. SIGNS AND SYMPTOMS Symptoms of brain AVM may include:  Seizures.  Headache.  Inability to move (paralysis).  Loss of or difficulty with:  Speech.  Memory.  Vision.  Coordination.  Muscle weakness.  Feeling confused.  Experiencing things that are not real  (hallucinating). It is also possible that you may not have any symptoms. DIAGNOSIS Diagnosis may include:  Medical history and physical exam.  MRI or magnetic resonance angiogram (MRA).  CT scan.  Cerebral angiogram.  Electroencephalogram (EEG). TREATMENT Treatment will depend on the size, location, and severity of the brain AVM. Treatment may include:  Surgery to remove the AVM.  Embolization. This involves closing off the vessels of the AVM by injecting glue into them.  Radiosurgery. This involves focusing radiation on the AVM.  Medicines to control your symptoms. HOME CARE INSTRUCTIONS  Take medicines only as directed by your health care provider.  Do not take blood thinners, such as aspirin or warfarin, unless instructed by your health care provider.  Keep all follow-up visits as directed by your health care provider. This is important.  Avoid lifting heavy objects as directed by your health care provider. SEEK IMMEDIATE MEDICAL CARE IF:  You have a sudden, severe headache with no known cause.  You have nausea or vomiting occurring with another symptom.  You have sudden weakness or numbness of your face, arm, or leg, especially on one side of your body.  You have sudden trouble walking or difficulty moving your arms or legs.  You have sudden confusion.  You have sudden trouble speaking (aphasia) or understanding.  You have sudden trouble seeing in one or both of your eyes.  You have a sudden loss of balance or coordination.  You have a stiff neck.  You have difficulty breathing.  You have a partial or total loss of consciousness. These symptoms may represent a serious  problem that is an emergency. Do not wait to see if the symptoms will go away. Get medical help right away. Call your local emergency services (911 in the [place] Do not drive yourself to the hospital.   This information is not intended to replace advice given to you by your health care  provider. Make sure you discuss any questions you have with your health care provider.   Document Released: 04/11/2002 Document Revised: 05/12/2014 Document Reviewed: 11/24/2013 Elsevier Interactive Patient Education [employer].

## 2016-01-04 NOTE — Progress Notes (Signed)
[facility] [address] [place]. [place] [address] [phone]       OFFICE FOLLOW-UP NOTE  Timothy Maddox Date of Birth:  1946-01-09 Medical Record Number:  [id number]   HPI: Timothy Maddox is a 17 year Caucasian male seen today for first office follow-up visit following hospital admission for intracerebral hemorrhage in July 2017. He is accompanied by his daughter.The patient was recently admitted to the [facility] from 7/11-7/14/17. He presented initially to [facility] ED with numbness around his mouth and slurred speech. Imaging revealed a left frontal intracerebral hemorrhage and he was transferred to [facility] for further management. He underwent CT angiogram and MRI scan of the brain which did not indicate any underlying structural pathology that would explain his hemorrhage. Conventional four-vessel cerebral angiogram on 11/16/15 showed a pial AV fistula in the left perisylvian region that appear to be supplied by an anterior parietal branch off of the inferior division of the left middle cerebral artery associated with an early draining vein the drain into the sphenoparietal sinus and a large cortical vein in the frontal convexity. This was felt to be the source of his hemorrhage. He was discharged home with instructions to follow up for elective endovascular treatment in 1-2 months. He and his family report that he has had persistent slurred speech since his hemorrhage. On 12/19/15 he was at home when he experienced some abnormal motor activity involving his right arm. He reports that he had uncontrolled jerking in the right arm. This was witnessed by his family who also reported that he had some blinking of his eyes and some twitching of his mouth but no involvement of the right leg or the left side of his body. He was apparently responsive during this episode. The patient has good recollection of these events. He was brought to the emergency department  for evaluation. While in the ED, he had another episode that was described as right gaze deviation with possible generalized tonic-clonic seizure activity. He does not recall this event. He was given Ativan 1 mg IV and loaded with Keppra 1000 mg IV. He has no previous history of seizure activity. Patient was discharged home on Keppra 500 twice a day which he seems to tolerating well without any side effects. He was advised to see Dr. Estanislado Pandy for follow-up in 1-2 months to decide on elective AVM endovascular treatment but this has not yet happened. He states his speech is mostly improved and occasionally has some word finding difficulties particularly when tries to talk phosphorus tired. He states his blood pressure is well controlled and today it is 123/73. He has no neurological complaints today  ROS:   14 system review of systems is positive for  speech difficulty, blurred vision, numbness, seizure and all other systems negative PMH:  Past Medical History:  Diagnosis Date  . Arthritis    lower back shoulders  . AVM (arteriovenous malformation) brain   . CVA (cerebral infarction) 11/2015  . Family history of adverse reaction to anesthesia   . HOH (hard of hearing)   . Hyperlipidemia   . Hypertension   . Prediabetes   . Seizures ([place])   . Stroke [facility])     Social History:  Social History   Social History  . Marital status: Single    Spouse name: N/A  . Number of children: N/A  . Years of education: N/A   Occupational History  . retired Curator    Social History Main Topics  .  Smoking status: Never Smoker  . Smokeless tobacco: Never Used  . Alcohol use No  . Drug use: No  . Sexual activity: Not on file   Other Topics Concern  . Not on file   Social History Narrative  . No narrative on file    Medications:   Current Outpatient Prescriptions on File Prior to Visit  Medication Sig Dispense Refill  . amLODipine (NORVASC) 10 MG tablet Take 10 mg by mouth daily.    Marland Kitchen  levETIRAcetam (KEPPRA) 500 MG tablet Take 1 tablet (500 mg total) by mouth 2 (two) times daily. 60 tablet 3  . losartan (COZAAR) 100 MG tablet Take 1 tablet (100 mg total) by mouth daily. 30 tablet 1  . metoprolol (LOPRESSOR) 50 MG tablet Take 50 mg tablet twice a day 60 tablet 1  . rosuvastatin (CRESTOR) 10 MG tablet Take 10 mg by mouth daily.    Marland Kitchen spironolactone (ALDACTONE) 100 MG tablet Take 100 mg by mouth 2 (two) times daily.     No current facility-administered medications on file prior to visit.     Allergies:  No Known Allergies  Physical Exam General: well developed, well nourished middle-age Caucasian male, seated, in no evident distress Head: head normocephalic and atraumatic.  Neck: supple with no carotid or supraclavicular bruits Cardiovascular: regular rate and rhythm, no murmurs Musculoskeletal: no deformity Skin:  no rash/petichiae Vascular:  Normal pulses all extremities Vitals:   01/04/16 1025  BP: 123/77  Pulse: (!) 57   Neurologic Exam Mental Status: Awake and fully alert. Oriented to place and time. Recent and remote memory intact. Attention span, concentration and fund of knowledge appropriate. Mood and affect appropriate. Speech is fluent without hesitancy. Good comprehension. Occasional fever slurred words when he talks quickly. Cranial Nerves: Fundoscopic exam reveals sharp disc margins. Pupils equal, briskly reactive to light. Extraocular movements full without nystagmus. Visual fields full to confrontation. Hearing diminished on the right side. Facial sensation intact. Face, tongue, palate moves normally and symmetrically.  Motor: Normal bulk and tone. Normal strength in all tested extremity muscles. Sensory.: intact to touch ,pinprick .position and vibratory sensation.  Coordination: Rapid alternating movements normal in all extremities. Finger-to-nose and heel-to-shin performed accurately bilaterally. Gait and Station: Arises from chair without difficulty.  Stance is normal. Gait demonstrates normal stride length and balance . Able to heel, toe and tandem walk without difficulty.  Reflexes: 1+ and symmetric. Toes downgoing.   NIHSS  0 Modified Rankin  1   ASSESSMENT: 78 year Caucasian male with left frontal parenchymal intracerebral hemorrhage due to his small AVM in July 2017with symptomatic simple partial seizures in August 2017.    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 I recommend he stay on Keppra 500 mg twice daily which is tolerating well without side effects. I advised him to avoid seizure provoking stimuli like sleep deprivation, medication noncompliance and alcohol use. I also encouraged him to see Dr. Estanislado Pandy to discuss endovascular treatment for his AVM. Also recommend repeat EEG study.Per [facility] statutes, patients with seizures are not allowed to drive until they have been seizure-free for six months. Use caution when using heavy equipment or power tools. Avoid working on ladders or at heights. Take showers instead of baths. Ensure the water temperature is not too high on the home water heater. Do not go swimming alone. When caring for infants or small  children, sit down when holding, feeding, or changing them to minimize risk of injury to the child in the event you have a seizure. Return for follow-up to see me in 3 months or call earlier if necessary. Greater than 50% of time during this 25 minute visit was spent on counseling,explanation of diagnosis, planning of further management, discussion with patient and family and coordination of care Antony Contras, MD Medical Director [place] Pager: [phone] 01/04/2016 2:18 PM Note: This document was prepared with digital dictation and possible smart phrase technology. Any transcriptional errors that result from this process are  unintentional

## 2016-01-08 ENCOUNTER — Ambulatory Visit: Payer: Medicare HMO | Admitting: Speech Pathology

## 2016-01-09 ENCOUNTER — Other Ambulatory Visit (HOSPITAL_COMMUNITY): Payer: Self-pay | Admitting: Interventional Radiology

## 2016-01-11 ENCOUNTER — Other Ambulatory Visit: Payer: Self-pay | Admitting: Neurology

## 2016-01-11 ENCOUNTER — Encounter (INDEPENDENT_AMBULATORY_CARE_PROVIDER_SITE_OTHER): Payer: Self-pay

## 2016-01-11 ENCOUNTER — Telehealth (HOSPITAL_COMMUNITY): Payer: Self-pay | Admitting: Radiology

## 2016-01-11 ENCOUNTER — Ambulatory Visit (INDEPENDENT_AMBULATORY_CARE_PROVIDER_SITE_OTHER): Payer: Self-pay

## 2016-01-11 ENCOUNTER — Other Ambulatory Visit (HOSPITAL_COMMUNITY): Payer: Self-pay | Admitting: Interventional Radiology

## 2016-01-11 DIAGNOSIS — R569 Unspecified convulsions: Secondary | ICD-10-CM

## 2016-01-11 DIAGNOSIS — Q282 Arteriovenous malformation of cerebral vessels: Secondary | ICD-10-CM

## 2016-01-11 DIAGNOSIS — Z0289 Encounter for other administrative examinations: Secondary | ICD-10-CM

## 2016-01-11 NOTE — Telephone Encounter (Signed)
Called pt and daughter, left VM on both of their phones for them to call to schedule appt with Dev. Velora Mediate

## 2016-01-14 ENCOUNTER — Telehealth: Payer: Self-pay | Admitting: Neurology
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
## 2016-01-15 ENCOUNTER — Telehealth: Payer: Self-pay | Admitting: Neurology

## 2016-01-15 NOTE — Telephone Encounter (Signed)
Timothy Maddox/ Dr. Arlean Hopping  office called back stating pt has consult appt for 01/31/16

## 2016-01-15 NOTE — Telephone Encounter (Signed)
Noted Dcox

## 2016-01-15 NOTE — Telephone Encounter (Signed)
Called Timothy Maddox and left her message asking her status of angio [phone].

## 2016-01-17 ENCOUNTER — Other Ambulatory Visit (INDEPENDENT_AMBULATORY_CARE_PROVIDER_SITE_OTHER): Payer: Medicare HMO | Admitting: Neurology

## 2016-01-17 DIAGNOSIS — R569 Unspecified convulsions: Secondary | ICD-10-CM

## 2016-01-22 ENCOUNTER — Telehealth: Payer: Self-pay

## 2016-01-22 NOTE — Telephone Encounter (Signed)
Made in error

## 2016-01-31 ENCOUNTER — Other Ambulatory Visit (HOSPITAL_COMMUNITY): Payer: Self-pay | Admitting: Interventional Radiology

## 2016-01-31 ENCOUNTER — Ambulatory Visit (HOSPITAL_COMMUNITY)
Admission: RE | Admit: 2016-01-31 | Discharge: 2016-01-31 | Disposition: A | Discharge status: Home / Self Care | Payer: Medicare HMO | Source: Ambulatory Visit | Attending: Interventional Radiology | Admitting: Interventional Radiology

## 2016-01-31 DIAGNOSIS — Q282 Arteriovenous malformation of cerebral vessels: Secondary | ICD-10-CM

## 2016-01-31 HISTORY — PX: IR GENERIC HISTORICAL: IMG1180011

## 2016-02-01 ENCOUNTER — Encounter (HOSPITAL_COMMUNITY): Payer: Self-pay | Admitting: Interventional Radiology

## 2016-02-13 ENCOUNTER — Other Ambulatory Visit: Payer: Self-pay | Admitting: Radiology

## 2016-02-15 ENCOUNTER — Encounter (HOSPITAL_COMMUNITY): Payer: Self-pay

## 2016-02-15 ENCOUNTER — Encounter (HOSPITAL_COMMUNITY)
Admission: RE | Admit: 2016-02-15 | Discharge: 2016-02-15 | Disposition: A | Discharge status: Home / Self Care | Payer: Medicare HMO | Source: Ambulatory Visit | Attending: Interventional Radiology | Admitting: Interventional Radiology

## 2016-02-15 DIAGNOSIS — Z01812 Encounter for preprocedural laboratory examination: Secondary | ICD-10-CM | POA: Diagnosis present

## 2016-02-15 DIAGNOSIS — Z8546 Personal history of malignant neoplasm of prostate: Secondary | ICD-10-CM | POA: Insufficient documentation

## 2016-02-15 DIAGNOSIS — E785 Hyperlipidemia, unspecified: Secondary | ICD-10-CM | POA: Diagnosis not present

## 2016-02-15 DIAGNOSIS — R9089 Other abnormal findings on diagnostic imaging of central nervous system: Secondary | ICD-10-CM | POA: Diagnosis not present

## 2016-02-15 DIAGNOSIS — Z833 Family history of diabetes mellitus: Secondary | ICD-10-CM | POA: Diagnosis not present

## 2016-02-15 DIAGNOSIS — Z79899 Other long term (current) drug therapy: Secondary | ICD-10-CM | POA: Diagnosis not present

## 2016-02-15 DIAGNOSIS — Z8673 Personal history of transient ischemic attack (TIA), and cerebral infarction without residual deficits: Secondary | ICD-10-CM | POA: Insufficient documentation

## 2016-02-15 DIAGNOSIS — I10 Essential (primary) hypertension: Secondary | ICD-10-CM | POA: Insufficient documentation

## 2016-02-15 DIAGNOSIS — Z8249 Family history of ischemic heart disease and other diseases of the circulatory system: Secondary | ICD-10-CM | POA: Insufficient documentation

## 2016-02-15 DIAGNOSIS — R7303 Prediabetes: Secondary | ICD-10-CM | POA: Diagnosis not present

## 2016-02-15 DIAGNOSIS — Z87891 Personal history of nicotine dependence: Secondary | ICD-10-CM | POA: Insufficient documentation

## 2016-02-15 HISTORY — DX: Malignant (primary) neoplasm, unspecified: C80.1

## 2016-02-15 HISTORY — DX: Prediabetes: R73.03

## 2016-02-15 HISTORY — DX: Dyspnea, unspecified: R06.00

## 2016-02-15 HISTORY — DX: Pneumonia, unspecified organism: J18.9

## 2016-02-15 LAB — CBC WITH DIFFERENTIAL/PLATELET
Basophils Absolute: 0 10*3/uL (ref 0.0–0.1)
Basophils Relative: 0 %
EOS PCT: 4 %
Eosinophils Absolute: 0.3 10*3/uL (ref 0.0–0.7)
HEMATOCRIT: 45.7 % (ref 39.0–52.0)
HEMOGLOBIN: 16 g/dL (ref 13.0–17.0)
LYMPHS ABS: 1.5 10*3/uL (ref 0.7–4.0)
LYMPHS PCT: 25 %
MCH: 30.7 pg (ref 26.0–34.0)
MCHC: 35 g/dL (ref 30.0–36.0)
MCV: 87.5 fL (ref 78.0–100.0)
Monocytes Absolute: 0.7 10*3/uL (ref 0.1–1.0)
Monocytes Relative: 12 %
NEUTROS ABS: 3.4 10*3/uL (ref 1.7–7.7)
Neutrophils Relative %: 59 %
PLATELETS: 192 10*3/uL (ref 150–400)
RBC: 5.22 MIL/uL (ref 4.22–5.81)
RDW: 13.2 % (ref 11.5–15.5)
WBC: 5.9 10*3/uL (ref 4.0–10.5)

## 2016-02-15 LAB — COMPREHENSIVE METABOLIC PANEL
ALK PHOS: 40 U/L (ref 38–126)
ALT: 27 U/L (ref 17–63)
AST: 24 U/L (ref 15–41)
Albumin: 4 g/dL (ref 3.5–5.0)
Anion gap: 8 (ref 5–15)
BILIRUBIN TOTAL: 0.6 mg/dL (ref 0.3–1.2)
BUN: 21 mg/dL — AB (ref 6–20)
CALCIUM: 9.8 mg/dL (ref 8.9–10.3)
CO2: 26 mmol/L (ref 22–32)
CREATININE: 1.4 mg/dL — AB (ref 0.61–1.24)
Chloride: 104 mmol/L (ref 101–111)
GFR, EST AFRICAN AMERICAN: 58 mL/min — AB (ref >60)
GFR, EST NON AFRICAN AMERICAN: 50 mL/min — AB (ref >60)
Glucose, Bld: 124 mg/dL — ABNORMAL HIGH (ref 65–99)
Potassium: 3.8 mmol/L (ref 3.5–5.1)
Sodium: 138 mmol/L (ref 135–145)
TOTAL PROTEIN: 7.3 g/dL (ref 6.5–8.1)

## 2016-02-15 LAB — APTT: aPTT: 31 seconds (ref 24–36)

## 2016-02-15 LAB — PROTIME-INR
INR: 1.01
PROTHROMBIN TIME: 13.3 s (ref 11.4–15.2)

## 2016-02-15 NOTE — Progress Notes (Signed)
Mr Timothy Maddox had a stroke in August of 2017, patient denies any residual effects.  Patient denies chest pain or shortness of breath.  Mr Timothy Maddox PCP is Dr Lillard Anes at [facility].

## 2016-02-15 NOTE — Progress Notes (Signed)
   02/15/16 [place]  Have you ever been diagnosed with sleep apnea through a sleep study? No  Do you snore loudly (loud enough to be heard through closed doors)?  0  Do you often feel tired, fatigued, or sleepy during the daytime (such as falling asleep during driving or talking to someone)? 1  Has anyone observed you stop breathing during your sleep? 0  Do you have, or are you being treated for high blood pressure? 1  BMI more than 35 kg/m2? 1  Age > 50 (1-yes) 1  Neck circumference greater than:Male 16 inches or larger, Male 17inches or larger? 1 (9.5)  Male Gender (Yes=1) 1  Obstructive Sleep Apnea Score 6  Score 5 or greater  Results sent to PCP

## 2016-02-15 NOTE — Pre-Procedure Instructions (Addendum)
    Timothy Maddox  02/15/2016     Your procedure is scheduled on Wednesday, October 18.  Report to [facility] Admitting at 6:30 AM                Your surgery or procedure is scheduled for 9:00 AM   Call this number if you have problems the morning of surgery:[phone]                For any other questions, please call [phone], Monday - Friday 8 AM - 4 PM.   Remember:  Do not eat food or drink liquids after midnight Tuesday, October 12.  Take these medicines the morning of surgery with A SIP OF WATER: amLODipine (NORVASC), levETIRAcetam (KEPPRA), metoprolol (LOPRESSOR).    Do not wear jewelry, make-up or nail polish.  Do not wear lotions, powders, or perfumes, or deodorant.   Men may shave face and neck.  Do not bring valuables to the hospital.  [facility] is not responsible for any belongings or valuables.  Contacts, dentures or bridgework may not be worn into surgery.  Leave your suitcase in the car.  After surgery it may be brought to your room.  For patients admitted to the hospital, discharge time will be determined by your treatment team.  Patients discharged the day of surgery will not be allowed to drive home.   Name and phone number of your driver:  -  Special instructions:  Review  [name] - Preparing For Surgery.  Please read over the following fact sheets that you were given. [facility]- Preparing For Surgery , Coughing and Deep Breathing and Pain Booklet.

## 2016-02-16 LAB — HEMOGLOBIN A1C
HEMOGLOBIN A1C: 6.3 % — AB (ref 4.8–5.6)
MEAN PLASMA GLUCOSE: 134 mg/dL

## 2016-02-18 ENCOUNTER — Other Ambulatory Visit: Payer: Self-pay | Admitting: General Surgery

## 2016-02-19 ENCOUNTER — Other Ambulatory Visit: Payer: Self-pay | Admitting: General Surgery

## 2016-02-20 ENCOUNTER — Ambulatory Visit (HOSPITAL_COMMUNITY)
Admission: RE | Admit: 2016-02-20 | Discharge: 2016-02-20 | Disposition: A | Discharge status: Home / Self Care | Payer: Medicare HMO | Source: Ambulatory Visit | Attending: Interventional Radiology | Admitting: Interventional Radiology

## 2016-02-20 ENCOUNTER — Encounter (HOSPITAL_COMMUNITY)
Admission: RE | Disposition: A | Discharge status: Home / Self Care | Payer: Self-pay | Source: Ambulatory Visit | Attending: Interventional Radiology

## 2016-02-20 ENCOUNTER — Encounter (HOSPITAL_COMMUNITY): Payer: Self-pay

## 2016-02-20 ENCOUNTER — Ambulatory Visit (HOSPITAL_COMMUNITY): Payer: Medicare HMO | Admitting: Certified Registered"

## 2016-02-20 ENCOUNTER — Encounter (HOSPITAL_COMMUNITY): Payer: Self-pay | Admitting: Urology

## 2016-02-20 DIAGNOSIS — R7303 Prediabetes: Secondary | ICD-10-CM | POA: Diagnosis not present

## 2016-02-20 DIAGNOSIS — I69128 Other speech and language deficits following nontraumatic intracerebral hemorrhage: Secondary | ICD-10-CM | POA: Diagnosis not present

## 2016-02-20 DIAGNOSIS — Z8249 Family history of ischemic heart disease and other diseases of the circulatory system: Secondary | ICD-10-CM | POA: Diagnosis not present

## 2016-02-20 DIAGNOSIS — Z87891 Personal history of nicotine dependence: Secondary | ICD-10-CM | POA: Diagnosis not present

## 2016-02-20 DIAGNOSIS — Q282 Arteriovenous malformation of cerebral vessels: Secondary | ICD-10-CM

## 2016-02-20 DIAGNOSIS — Z8546 Personal history of malignant neoplasm of prostate: Secondary | ICD-10-CM | POA: Diagnosis not present

## 2016-02-20 DIAGNOSIS — Z79899 Other long term (current) drug therapy: Secondary | ICD-10-CM | POA: Diagnosis not present

## 2016-02-20 DIAGNOSIS — E785 Hyperlipidemia, unspecified: Secondary | ICD-10-CM | POA: Insufficient documentation

## 2016-02-20 DIAGNOSIS — I10 Essential (primary) hypertension: Secondary | ICD-10-CM | POA: Insufficient documentation

## 2016-02-20 HISTORY — PX: IR GENERIC HISTORICAL: IMG1180011

## 2016-02-20 HISTORY — PX: RADIOLOGY WITH ANESTHESIA: SHX6223

## 2016-02-20 LAB — BASIC METABOLIC PANEL
ANION GAP: 7 (ref 5–15)
BUN: 18 mg/dL (ref 6–20)
CALCIUM: 9.5 mg/dL (ref 8.9–10.3)
CO2: 28 mmol/L (ref 22–32)
Chloride: 104 mmol/L (ref 101–111)
Creatinine, Ser: 1.4 mg/dL — ABNORMAL HIGH (ref 0.61–1.24)
GFR, EST AFRICAN AMERICAN: 58 mL/min — AB (ref >60)
GFR, EST NON AFRICAN AMERICAN: 50 mL/min — AB (ref >60)
Glucose, Bld: 117 mg/dL — ABNORMAL HIGH (ref 65–99)
Potassium: 3.9 mmol/L (ref 3.5–5.1)
Sodium: 139 mmol/L (ref 135–145)

## 2016-02-20 LAB — PLATELET INHIBITION P2Y12: PLATELET FUNCTION P2Y12: 172 [PRU] — AB (ref 194–418)

## 2016-02-20 SURGERY — RADIOLOGY WITH ANESTHESIA
Anesthesia: General

## 2016-02-20 MED ORDER — ASPIRIN 81 MG PO CHEW
CHEWABLE_TABLET | ORAL | Status: AC
  Administered 2016-02-20: 324 mg via ORAL
  Filled 2016-02-20: qty 4

## 2016-02-20 MED ORDER — LACTATED RINGERS IV SOLN
INTRAVENOUS | Status: DC | PRN
  Administered 2016-02-20: 09:00:00 via INTRAVENOUS

## 2016-02-20 MED ORDER — SODIUM CHLORIDE 0.9 % IV SOLN
INTRAVENOUS | Status: AC
  Administered 2016-02-20: 12:00:00 via INTRAVENOUS

## 2016-02-20 MED ORDER — MIDAZOLAM HCL 5 MG/5ML IJ SOLN
INTRAMUSCULAR | Status: DC | PRN
  Administered 2016-02-20: 1 mg via INTRAVENOUS

## 2016-02-20 MED ORDER — CEFAZOLIN SODIUM-DEXTROSE 2-4 GM/100ML-% IV SOLN
INTRAVENOUS | Status: AC
  Filled 2016-02-20: qty 100

## 2016-02-20 MED ORDER — ASPIRIN 81 MG PO CHEW
324.0000 mg | CHEWABLE_TABLET | Freq: Once | ORAL | Status: AC
Start: 2016-02-20 — End: 2016-02-20
  Administered 2016-02-20: 324 mg via ORAL
  Filled 2016-02-20: qty 4

## 2016-02-20 MED ORDER — IOPAMIDOL (ISOVUE-300) INJECTION 61%
INTRAVENOUS | Status: AC
  Administered 2016-02-20: 20 mL
  Filled 2016-02-20: qty 100

## 2016-02-20 MED ORDER — IOPAMIDOL (ISOVUE-300) INJECTION 61%
INTRAVENOUS | Status: AC
  Administered 2016-02-20: 40 mL
  Filled 2016-02-20: qty 150

## 2016-02-20 MED ORDER — ASPIRIN EC 325 MG PO TBEC: 325.0000 mg | DELAYED_RELEASE_TABLET | ORAL | Status: DC

## 2016-02-20 MED ORDER — NIMODIPINE 30 MG PO CAPS
0.0000 mg | ORAL_CAPSULE | ORAL | Status: AC
  Administered 2016-02-20: 30 mg via ORAL

## 2016-02-20 MED ORDER — FENTANYL CITRATE (PF) 100 MCG/2ML IJ SOLN
INTRAMUSCULAR | Status: DC | PRN
  Administered 2016-02-20: 50 ug via INTRAVENOUS

## 2016-02-20 MED ORDER — NIMODIPINE 30 MG PO CAPS
ORAL_CAPSULE | ORAL | Status: AC
  Administered 2016-02-20: 30 mg via ORAL
  Filled 2016-02-20: qty 1

## 2016-02-20 MED ORDER — LIDOCAINE HCL 1 % IJ SOLN
INTRAMUSCULAR | Status: AC | PRN
  Administered 2016-02-20: 10 mL

## 2016-02-20 MED ORDER — HEPARIN SODIUM (PORCINE) 1000 UNIT/ML IJ SOLN
INTRAMUSCULAR | Status: DC | PRN
  Administered 2016-02-20: 1000 [IU] via INTRAVENOUS

## 2016-02-20 MED ORDER — SODIUM CHLORIDE 0.9 % IV SOLN: INTRAVENOUS | Status: DC

## 2016-02-20 MED ORDER — LIDOCAINE HCL 1 % IJ SOLN
INTRAMUSCULAR | Status: AC
  Filled 2016-02-20: qty 20

## 2016-02-20 MED ORDER — CEFAZOLIN SODIUM-DEXTROSE 2-4 GM/100ML-% IV SOLN: 2.0000 g | INTRAVENOUS | Status: DC

## 2016-02-20 NOTE — Sedation Documentation (Signed)
Assumed care of pt from Anesthesia. Pt is awake and alert. In no distress. No intervention required.

## 2016-02-20 NOTE — Sedation Documentation (Signed)
5 Fr. Exoseal to right groin 

## 2016-02-20 NOTE — Anesthesia Postprocedure Evaluation (Signed)
Anesthesia Post Note  Patient: Timothy Maddox  Procedure(s) Performed: Procedure(s) (LRB): EMBOLIZATION (N/A)  Patient location during evaluation: PACU Anesthesia Type: MAC Level of consciousness: awake and alert Pain management: pain level controlled Vital Signs Assessment: post-procedure vital signs reviewed and stable Respiratory status: spontaneous breathing, nonlabored ventilation, respiratory function stable and patient connected to nasal cannula oxygen Cardiovascular status: stable and blood pressure returned to baseline Anesthetic complications: no    Last Vitals:  Vitals:   02/20/16 1217 02/20/16 1247  BP: 111/75 91/63  Pulse: (!) 49 (!) 49    Last Pain: There were no vitals filed for this visit.               Hadar Elgersma Minda Meo

## 2016-02-20 NOTE — Discharge Instructions (Signed)
Angiogram, Care After These instructions give you information about caring for yourself after your procedure. Your doctor may also give you more specific instructions. Call your doctor if you have any problems or questions after your procedure.  HOME CARE  Take medicines only as told by your doctor.  Follow your doctor's instructions about:  Care of the area where the tube was inserted.  Bandage (dressing) changes and removal.  You may shower 24-48 hours after the procedure or as told by your doctor.  Do not take baths, swim, or use a hot tub until your doctor approves.  Every day, check the area where the tube was inserted. Watch for:  Redness, swelling, or pain.  Fluid, blood, or pus.  Do not apply powder or lotion to the site.  Do not lift anything that is heavier than 10 lb (4.5 kg) for 5 days or as told by your doctor.  Ask your doctor when you can:  Return to work or school.  Do physical activities or play sports.  Have sex.  Do not drive or operate heavy machinery for 24 hours or as told by your doctor.  Have someone with you for the first 24 hours after the procedure.  Keep all follow-up visits as told by your doctor. This is important. GET HELP IF:  You have a fever.   You have chills.   You have more bleeding from the area where the tube was inserted. Call 911 and hold pressure on the area for 30 minutes if able.   If theree is redness, swelling, or pain in the area where the tube was inserted.  You have fluid or pus coming from the area. GET HELP RIGHT AWAY IF:   You have a lot of pain in the area where the tube was inserted.  The area where the tube was inserted is bleeding, and the bleeding does not stop after 30 minutes of holding steady pressure on the area.  The area near or just beyond the insertion site becomes pale, cool, tingly, or numb.   This information is not intended to replace advice given to you by your health care provider. Make  sure you discuss any questions you have with your health care provider.   Document Released: 07/18/2008 Document Revised: 05/12/2014 Document Reviewed: 09/22/2012 Elsevier Interactive Patient Education [employer].

## 2016-02-20 NOTE — Progress Notes (Signed)
Pt with EC aspirin ordered, spoke with Gareth Eagle PA, ordered for this to be chewable instead

## 2016-02-20 NOTE — Transfer of Care (Signed)
Immediate Anesthesia Transfer of Care Note  Patient: Timothy Maddox  Procedure(s) Performed: Procedure(s): EMBOLIZATION (N/A)  Patient Location: Radiology  Anesthesia Type:MAC  Level of Consciousness: awake, alert , oriented and patient cooperative  Airway & Oxygen Therapy: Patient Spontanous Breathing  Post-op Assessment: Report given to RN, Post -op Vital signs reviewed and stable and Patient moving all extremities  Post vital signs: Reviewed and stable  Last Vitals: There were no vitals filed for this visit.  Last Pain: There were no vitals filed for this visit.       Complications: No apparent anesthesia complications

## 2016-02-20 NOTE — Procedures (Signed)
S/P Lt common carorid arteroiogram. RT CFA approach. Findings. 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 2.Also previously noted ant frontal venous engorgement absent on todays exam.

## 2016-02-20 NOTE — Anesthesia Preprocedure Evaluation (Addendum)
Anesthesia Evaluation  Patient identified by MRN, date of birth, ID band Patient awake    Reviewed: Allergy & Precautions, NPO status , Patient's Chart, lab work & pertinent test results  Airway Mallampati: II  TM Distance: >3 FB Neck ROM: Full    Dental  (+) Edentulous Upper, Edentulous Lower   Pulmonary neg pulmonary ROS, former smoker,    Pulmonary exam normal        Cardiovascular hypertension, Pt. on medications and Pt. on home beta blockers Normal cardiovascular exam     Neuro/Psych Seizures -,  CVA (secondary to [place] ), Residual Symptoms negative psych ROS   GI/Hepatic negative GI ROS, Neg liver ROS,   Endo/Other  negative endocrine ROS  Renal/GU negative Renal ROS  negative genitourinary   Musculoskeletal  (+) Arthritis ,   Abdominal   Peds negative pediatric ROS (+)  Hematology negative hematology ROS (+)   Anesthesia Other Findings   Reproductive/Obstetrics negative OB ROS                            Anesthesia Physical Anesthesia Plan  ASA: III  Anesthesia Plan: General   Post-op Pain Management:    Induction: Intravenous  Airway Management Planned: Oral ETT  Additional Equipment: Arterial line  Intra-op Plan:   Post-operative Plan: Extubation in OR  Informed Consent: I have reviewed the patients History and Physical, chart, labs and discussed the procedure including the risks, benefits and alternatives for the proposed anesthesia with the patient or authorized representative who has indicated his/her understanding and acceptance.   Dental advisory given  Plan Discussed with: CRNA  Anesthesia Plan Comments:         Anesthesia Quick Evaluation

## 2016-02-20 NOTE — H&P (Signed)
Chief Complaint:  Referring Physician(s): Dr. Leonie Man  Supervising Physician: [name]  Patient Status: Outpatient  History of Present Illness: Timothy Maddox is a 70 y.o. male right-handed gentleman who was evaluated by Dr. Estanislado Pandy on 02/01/2016.  He was referred by Dr. Leonie Man to evaluate the vascular abnormalities noted on an angiogram performed on 11/16/2015.  At that time the patient was admitted with symptoms of expressive aphasia caused by focal hemorrhage in the left frontal anterior periinsular region.  According to the daughter and the patient, he has been feeling extremely well. His speech is almost back to normal.  He has no symptoms of paresthesias, weakness, visual aberrations, blindness, double vision, incoordination or gait difficulties.  He has had no recent seizures and has been taking his anti-seizure medications on a regular basis.  He denies any nausea, vomiting or photophobia.  He is here today for catheter angiogram under anesthesia for better visualization of the area without motion artifact.   Should the diagnostic catheter angiogram under anesthesia reveal the presence of a vascular abnormality such as an arteriovenous fistula, and/or a nidus of an arteriovenous malformation, endovascular intervention with embolization would be an option if considered safe enough.  He is NPO. He does NOT take antiplatelet medications.   Past Medical History:  Diagnosis Date  . Arthritis    lower back, shoulders  . AVM (arteriovenous malformation) brain   . Cancer [facility])    prostate- treated with radiation  . CVA (cerebral infarction) 11/2015  . Dyspnea    with exertion  . HOH (hard of hearing)   . Hyperlipidemia   . Hypertension   . Pneumonia    1st grade  . Pre-diabetes   . Prediabetes   . Seizures ([place]) 12/2015  . Stroke [facility])    speech impairment, no weakness    Past Surgical History:  Procedure Laterality Date  . BACK SURGERY   1999   disectomy  . BACK SURGERY     2 surgery- injury  . COLONOSCOPY W/ POLYPECTOMY    . HAMMER TOE SURGERY Bilateral   . INGUINAL HERNIA REPAIR Right    per patient when he was in 1st grade  . IR GENERIC HISTORICAL  12/05/2015   IR RADIOLOGIST EVAL & MGMT 12/05/2015 [facility]-INTERV RAD  . IR GENERIC HISTORICAL  01/31/2016   IR RADIOLOGIST EVAL & MGMT 01/31/2016 [facility]-INTERV RAD  . KNEE SURGERY Bilateral 2015  . TOE SURGERY Left 2017   TOE NAIL REMOVAL    Allergies: No known allergies  Medications: Prior to Admission medications   Medication Sig Start Date End Date Taking? Authorizing Provider  acetaminophen (TYLENOL) 500 MG tablet Take 500 mg by mouth every 6 (six) hours as needed.    Historical Provider, MD  amLODipine (NORVASC) 10 MG tablet Take 10 mg by mouth daily.    Historical Provider, MD  ciclopirox (PENLAC) 8 % solution Apply 1 application topically. Applied to toe nail. 01/01/16   Historical Provider, MD  levETIRAcetam (KEPPRA) 500 MG tablet Take 1 tablet (500 mg total) by mouth 2 (two) times daily. 12/20/15   Courage Denton Brick, MD  losartan-hydrochlorothiazide (HYZAAR) 100-12.5 MG tablet Take 1 tablet by mouth daily. 01/01/16   Historical Provider, MD  metoprolol (LOPRESSOR) 100 MG tablet Take 100 mg by mouth 2 (two) times daily.  01/01/16   Historical Provider, MD  rosuvastatin (CRESTOR) 10 MG tablet Take 10 mg by mouth every evening.     Historical Provider, MD  spironolactone (ALDACTONE) 100 MG  tablet Take 100 mg by mouth 2 (two) times daily.    Historical Provider, MD     Family History  Problem Relation Age of Onset  . Heart failure Father   . Diabetes Father   . Hypertension Father   . Alzheimer's disease Father 32  . Other Mother 49    homicide    Social History   Social History  . Marital status: Single    Spouse name: N/A  . Number of children: N/A  . Years of education: N/A   Occupational History  . retired Curator    Social History Main Topics  . Smoking  status: Former Smoker    Years: 2.00  . Smokeless tobacco: Never Used     Comment: quit age 41  . Alcohol use No  . Drug use: No  . Sexual activity: Not Asked   Other Topics Concern  . None   Social History Narrative  . None    Review of Systems: A 12 point ROS discussed  Review of Systems  Constitutional: Negative for activity change, appetite change, chills, fatigue and fever.  HENT: Negative.   Respiratory: Negative for cough, shortness of breath and wheezing.   Cardiovascular: Negative for chest pain.  Gastrointestinal: Negative for abdominal pain, nausea and vomiting.  Musculoskeletal: Negative.   Neurological: Positive for speech difficulty. Negative for dizziness, seizures, syncope, weakness and numbness.  Psychiatric/Behavioral: Negative.     Vital Signs: BP 132/75   Pulse (!) 51   Temp 97.9 F (36.6 C) (Oral)   Resp 20   Ht 6' (1.829 m)   Wt 278 lb (126.1 kg)   SpO2 95%   BMI 37.70 kg/m   Physical Exam  Constitutional: He is oriented to person, place, and time.  Obese, NAD  HENT:  Head: Normocephalic and atraumatic.  Eyes: EOM are normal.  Neck: Normal range of motion.  Cardiovascular: Normal rate, regular rhythm and normal heart sounds.   Pulmonary/Chest: Effort normal and breath sounds normal. No respiratory distress. He has no wheezes.  Abdominal: Soft. He exhibits distension. There is no tenderness.  Musculoskeletal: Normal range of motion.  Neurological: He is alert and oriented to person, place, and time.  Skin: Skin is warm and dry.  Psychiatric: He has a normal mood and affect. His behavior is normal. Judgment and thought content normal.  Vitals reviewed.   Mallampati Score:  MD Evaluation Airway: WNL Heart: WNL Abdomen: WNL Chest/ Lungs: WNL ASA  Classification: 3 Mallampati/Airway Score: Two  Imaging: Ir Radiologist Eval & Mgmt  Result Date: 02/01/2016 EXAM: ESTABLISHED PATIENT OFFICE VISIT CHIEF COMPLAINT: Occasional headaches.  Left parietal periinsular arteriovenous shunting. Current Pain Level: 1-10 HISTORY OF PRESENT ILLNESS: The patient is a 70 year old right-handed gentleman who presents in follow-up accompanied by his daughter. The patient has been referred by Dr. Leonie Man, a stroke neurologist, to evaluate the vascular abnormalities noted on an angiogram performed on 11/16/2015. At that time the patient was admitted with symptoms of expressive aphasia caused by focal hemorrhage in the left frontal anterior periinsular region. According to the daughter and the patient, he has been feeling extremely well. His speech is almost back to normal. He has no symptoms of paresthesias, weakness, visual aberrations, blindness, double vision, incoordination or gait difficulties. He has had no recent seizures and has been taking his anti-seizure medications on a regular basis. He denies any nausea, vomiting or photophobia. Past medical history: Arthritis with low back pain. Previous history of lumbar surgery due to disc  herniation with residual left anterolateral thigh burning paresthesias. CV AVM as described above. Hearing difficulty. Hyperlipidemia. Hypertension. Pre diabetes. Social history: Single, has one daughter alive and well. The patient is a retired Curator. The patient states he never smoked. Does not drink any alcohol or use illicit chemicals. Medications at this time: Amlodipine. Keppra. Losartan. Metoprolol. Crestor. Spironolactone. Patient has no known allergies. Review of systems: Negative for pathologic symptomatology, otherwise, as mentioned above. PHYSICAL EXAMINATION: Alert, awake and oriented to time, place, space. Speech fluent and comprehension normal. No facial asymmetry.  Tongue in the midline. Motor sensory coordination, station and gait within normal limits. ASSESSMENT AND PLAN: The patient's catheter arteriogram of 11/16/2015 was reviewed with the patient and the patient's daughter. The abnormality noted was in the  posterior insular region with visualization of an early draining vein extending anteriorly and laterally. There were some angiographic suggestions of a very tiny nidus in this region. No aneurysms were identified in this region at that time. Further discussion ensued regarding workup to completion with a catheter angiogram under anesthesia for better visualization of this area without motion artifact. Should the diagnostic catheter angiogram under anesthesia reveal the presence of a vascular abnormality such as an arteriovenous fistula, and/or a nidus of an arteriovenous malformation, endovascular intervention with embolization would be an option if considered safe enough. Should this come to fruition, the risks of the procedure would be 1-2% chance of a thromboembolic stroke or hemorrhage. After remote this could lead to neurologic deterioration, loss of speech, right-sided motor function and even remote possibility of death. Questions were answered to the patient and daughter's satisfaction. The other option given was to continue to do a surveillance MRI MRA of the brain in 6 months from now. However, if the initial hemorrhage was due to the AVM, the risk for rerupture was significantly high in the region of 10-20%. The patient wants to proceed with a diagnostic catheter angiogram under general anesthesia with a potential for endovascular treatment should this be appropriate. He knows about the potential risks as mentioned above. Electronically Signed   By: [name] M.D.   On: 01/31/2016 14:55   CLINICAL DATA:  Expressive aphasia due to left power insular subcortical hemorrhage. Abnormal CT angiogram of the brain.  EXAM: BILATERAL COMMON CAROTID AND INNOMINATE ANGIOGRAPHY AND BILATERAL VERTEBRAL ARTERY ANGIOGRAMS  PROCEDURE: Contrast: Isovue 300 approximately 65 mL.  Anesthesia/Sedation:  Conscious sedation.  Medications: Versed 1 mg IV.  Fentanyl 50 mcg IV.  Following a full  explanation of the procedure along with the potential associated complications, an informed witnessed consent was obtained.  The right groin was prepped and draped in the usual sterile fashion. Thereafter using modified Seldinger technique, transfemoral access into the right common femoral artery was obtained without difficulty. Over a 0.035 inch guidewire, a 5 French Pinnacle sheath was inserted. Through this, and also over 0.035 inch guidewire, a 5 [place] JB 1 catheter was advanced to the aortic arch region and selectively positioned in the right common carotid artery, the innominate artery, the left common carotid artery and the left vertebral artery.  There were no acute complications. The patient tolerated the procedure well.  FINDINGS: The innominate artery injection demonstrates the origin of the right vertebral artery to be normal.  The vessel is seen to opacify to the cranial skull base.  Wide patency is seen of the right vertebrobasilar junction and the right posterior-inferior cerebellar artery.  The opacified portion of the basilar artery, the visualized posterior cerebral arteries,  superior cerebellar arteries and the anterior-inferior cerebellar arteries is grossly intact to the delayed arterial phase. Non-opacified blood is seen in the basilar artery from the contralateral vertebral artery.  The right common carotid arteriogram demonstrates the right external carotid artery origin to be normal.  The vessel has normal branches.  The right internal carotid artery at the bulb to the cranial skull base opacifies normally.  The petrous, the cavernous and the supraclinoid segments are widely patent.  The right middle cerebral artery and the right anterior cerebral artery opacify into the capillary and venous phases. The left vertebral artery origin is widely patent.  There is mild tortuosity proximally.  More distally the vessel is seen to  opacify with wide patency to the cranial skull base.  Normal opacification is seen of the left posterior inferior cerebellar artery and the left vertebrobasilar junction.  The basilar artery, posterior cerebral artery, superior cerebellar arteries and the anterior-inferior cerebellar arteries are seen to opacify normally into the capillary and venous phases.  The left common carotid arteriogram demonstrates the origin of the left external carotid artery to be mildly narrowed. The branches are normally opacified.  The left internal carotid artery at the bulb has a smooth shallow plaque along the posterior wall.  The vessel is otherwise seen to opacify normally to the cranial skull base.  The petrous, the cavernous and the supraclinoid segments are widely patent.  The left middle cerebral artery and the left anterior cerebral artery are seen to opacify into the capillary and venous phases.  There is abnormal prominence of the parietal branch of the inferior division of the left middle cerebral artery which is associated with a prominent early draining vein along the left peri-insular cortex draining anteriorly into the sphenoparietal sinus and superiorly towards a cortical vein in the frontal cortical convexity region. The delayed venous phase also demonstrates patchy areas of cortical venous prominence along the frontal convexity.  However, patency of the superior sagittal sinus is maintained.  IMPRESSION: Fast flow left peri-insular subcortical fistulous communication supplied by an anterior parietal branch of the inferior division of the left middle cerebral artery, with early drainage into a prominent venous structure which runs anteriorly and superiorly. Delayed venous phase demonstrating patchy prominence of the cortical veins overlying convexity suggestive of congestion.  However, the superior sagittal sinus is widely patent.  Angiographically no  evidence of an intracranial aneurysm noted.   Electronically Signed   By: [name] M.D.   On: 11/16/2015 15:17  Labs:  CBC:  Recent Labs  11/13/15 1330 11/15/15 0657 12/18/15 2230 02/15/16 1515  WBC 6.1 5.0 5.7 5.9  HGB 16.0 14.7 15.5 16.0  HCT 45.4 43.3 45.3 45.7  PLT 205 180 202 192    COAGS:  Recent Labs  11/13/15 1330 02/15/16 1515  INR 0.93 1.01  APTT 28 31    BMP:  Recent Labs  11/15/15 0657 12/18/15 2230 02/15/16 1515 02/20/16 0712  NA 137 135 138 139  K 3.7 3.9 3.8 3.9  CL 105 102 104 104  CO2 25 24 26 28   GLUCOSE 111* 107* 124* 117*  BUN 15 24* 21* 18  CALCIUM 8.7* 9.6 9.8 9.5  CREATININE 1.24 1.45* 1.40* 1.40*  GFRNONAA 58* 48* 50* 50*  GFRAA >60 55* 58* 58*    LIVER FUNCTION TESTS:  Recent Labs  11/13/15 1330 02/15/16 1515  BILITOT 0.4 0.6  AST 24 24  ALT 25 27  ALKPHOS 39 40  PROT 7.5 7.3  ALBUMIN  4.4 4.0    TUMOR MARKERS: No results for input(s): AFPTM, CEA, CA199, CHROMGRNA in the last 8760 hours.    Assessment and Plan:  Abnormal arteriogram done 11/16/2015  Will proceed with intracranial angiography today by Dr. Estanislado Pandy under anesthesia to minimize motion artifact and possibly proceed with angioplasty if deemed safe.  Risks and Benefits discussed with the patient including, but not limited to bleeding, infection, vascular injury, contrast induced renal failure, stroke or even death.  All of the patient's questions were answered, patient is agreeable to proceed. Consent signed and in chart.   Electronically Signed: Murrell Redden PA-C 02/20/2016, 8:20 AM   I spent a total of    25 Minutes in face to face in clinical consultation, greater than 50% of which was counseling/coordinating care for cerebral angiography

## 2016-02-21 ENCOUNTER — Encounter (HOSPITAL_COMMUNITY): Payer: Self-pay | Admitting: Interventional Radiology

## 2016-04-08 ENCOUNTER — Ambulatory Visit (INDEPENDENT_AMBULATORY_CARE_PROVIDER_SITE_OTHER): Payer: Medicare HMO | Admitting: Neurology

## 2016-04-08 ENCOUNTER — Encounter: Payer: Self-pay | Admitting: Neurology

## 2016-04-08 VITALS — BP 122/80 | HR 50 | Ht 72.0 in | Wt 280.4 lb

## 2016-04-08 DIAGNOSIS — R51 Headache: Secondary | ICD-10-CM | POA: Diagnosis not present

## 2016-04-08 DIAGNOSIS — R519 Headache, unspecified: Secondary | ICD-10-CM

## 2016-04-08 MED ORDER — TOPIRAMATE 50 MG PO TABS: 50.0000 mg | ORAL_TABLET | Freq: Two times a day (BID) | ORAL | 3 refills | Status: DC

## 2016-04-08 NOTE — Progress Notes (Signed)
[facility] [address] [place]. [place] [address] [phone]       OFFICE FOLLOW-UP NOTE  Timothy. Timothy Maddox Date of Birth:  1945-08-06 Medical Record Number:  [id number]   HPI: Initial visit  01/04/16 :Timothy Maddox is a 70 year Caucasian male seen today for first office follow-up visit following hospital admission for intracerebral hemorrhage in July 2017. He is accompanied by his daughter.The patient was recently admitted to the [facility] from 7/11-7/14/17. He presented initially to [facility] ED with numbness around his mouth and slurred speech. Imaging revealed a left frontal intracerebral hemorrhage and he was transferred to [facility] for further management. He underwent CT angiogram and MRI scan of the brain which did not indicate any underlying structural pathology that would explain his hemorrhage. Conventional four-vessel cerebral angiogram on 11/16/15 showed a pial AV fistula in the left perisylvian region that appear to be supplied by an anterior parietal branch off of the inferior division of the left middle cerebral artery associated with an early draining vein the drain into the sphenoparietal sinus and a large cortical vein in the frontal convexity. This was felt to be the source of his hemorrhage. He was discharged home with instructions to follow up for elective endovascular treatment in 1-2 months. He and his family report that he has had persistent slurred speech since his hemorrhage. On 12/19/15 he was at home when he experienced some abnormal motor activity involving his right arm. He reports that he had uncontrolled jerking in the right arm. This was witnessed by his family who also reported that he had some blinking of his eyes and some twitching of his mouth but no involvement of the right leg or the left side of his body. He was apparently responsive during this episode. The patient has good recollection of these events. He was brought to the  emergency department for evaluation. While in the ED, he had another episode that was described as right gaze deviation with possible generalized tonic-clonic seizure activity. He does not recall this event. He was given Ativan 1 mg IV and loaded with Keppra 1000 mg IV. He has no previous history of seizure activity. Patient was discharged home on Keppra 500 twice a day which he seems to tolerating well without any side effects. He was advised to see Dr. Estanislado Pandy for follow-up in 1-2 months to decide on elective AVM endovascular treatment but this has not yet happened. He states his speech is mostly improved and occasionally has some word finding difficulties particularly when tries to talk phosphorus tired. He states his blood pressure is well controlled and today it is 123/73. He has no neurological complaints today Update 04/08/2016 : He returns for follow-up after last visit 3 months ago. He is accompanied by his girlfriend. Patient has had no further seizures since he is on Keppra 500 twice daily which is starting well without side effects. He however states that he is having almost daily headaches. His headaches began when he had been hemorrhage but for the last 3-4 weeks he is having daily headaches. He describes this as being moderate to severe intensity located more in the left frontal and temporal regions. This is present most of the day. He has been taking atenolol couple of tablets a day which only partial relief. Denies any light sensitivity, nausea or vomiting. Patient has also been having some sinus problems with coughing as well. He feels this may be allergies acting up. He has not seen  his primary care physician for this yet. He did see Dr. Estanislado Pandy who did diagnostic cerebral catheter angiogram on 02/20/16 which showed improvement in the AVM but still persistent AVm vessels. The definitive treatment has been postponed and Dr. Estanislado Pandy patch to see him again in a few months. ROS:   14 system  review of systems is positive for   fatigue, blurred vision, cough, headache, seizure, snoring and all other systems negative PMH:  Past Medical History:  Diagnosis Date  . Arthritis    lower back, shoulders  . AVM (arteriovenous malformation) brain   . Cancer [facility])    prostate- treated with radiation  . CVA (cerebral infarction) 11/2015  . Dyspnea    with exertion  . HOH (hard of hearing)   . Hyperlipidemia   . Hypertension   . Pneumonia    1st grade  . Pre-diabetes   . Prediabetes   . Seizures ([place]) 12/2015  . Stroke [facility])    speech impairment, no weakness    Social History:  Social History   Social History  . Marital status: Single    Spouse name: N/A  . Number of children: N/A  . Years of education: N/A   Occupational History  . retired Curator    Social History Main Topics  . Smoking status: Former Smoker    Years: 2.00  . Smokeless tobacco: Never Used     Comment: quit age 34  . Alcohol use No  . Drug use: No  . Sexual activity: Not on file   Other Topics Concern  . Not on file   Social History Narrative  . No narrative on file    Medications:   Current Outpatient Prescriptions on File Prior to Visit  Medication Sig Dispense Refill  . acetaminophen (TYLENOL) 500 MG tablet Take 500 mg by mouth every 6 (six) hours as needed.    Marland Kitchen amLODipine (NORVASC) 10 MG tablet Take 10 mg by mouth daily.    . ciclopirox (PENLAC) 8 % solution Apply 1 application topically. Applied to toe nail.    Marland Kitchen levETIRAcetam (KEPPRA) 500 MG tablet Take 1 tablet (500 mg total) by mouth 2 (two) times daily. 60 tablet 3  . losartan-hydrochlorothiazide (HYZAAR) 100-12.5 MG tablet Take 1 tablet by mouth daily.    . metoprolol (LOPRESSOR) 100 MG tablet Take 100 mg by mouth 2 (two) times daily.     . rosuvastatin (CRESTOR) 10 MG tablet Take 10 mg by mouth every evening.     Marland Kitchen spironolactone (ALDACTONE) 100 MG tablet Take 100 mg by mouth 2 (two) times daily.     No current  facility-administered medications on file prior to visit.     Allergies:   Allergies  Allergen Reactions  . No Known Allergies     Physical Exam General: well developed, well nourished middle-age Caucasian male, seated, in no evident distress Head: head normocephalic and atraumatic.  Neck: supple with no carotid or supraclavicular bruits Cardiovascular: regular rate and rhythm, no murmurs Musculoskeletal: no deformity Skin:  no rash/petichiae Vascular:  Normal pulses all extremities Vitals:   04/08/16 1545  BP: 122/80  Pulse: (!) 50   Neurologic Exam Mental Status: Awake and fully alert. Oriented to place and time. Recent and remote memory intact. Attention span, concentration and fund of knowledge appropriate. Mood and affect appropriate. Speech is fluent without hesitancy. Good comprehension. Occasional fever slurred words when he talks quickly. Cranial Nerves: Fundoscopic exam reveals sharp disc margins. Pupils equal, briskly reactive to light.  Extraocular movements full without nystagmus. Visual fields full to confrontation. Hearing diminished on the right side. Facial sensation intact. Face, tongue, palate moves normally and symmetrically.  Motor: Normal bulk and tone. Normal strength in all tested extremity muscles. Sensory.: intact to touch ,pinprick .position and vibratory sensation.  Coordination: Rapid alternating movements normal in all extremities. Finger-to-nose and heel-to-shin performed accurately bilaterally. Gait and Station: Arises from chair without difficulty. Stance is normal. Gait demonstrates normal stride length and balance . Able to heel, toe and tandem walk without difficulty.  Reflexes: 1+ and symmetric. Toes downgoing.   NIHSS  0 Modified Rankin  1   ASSESSMENT: 39 year Caucasian male with left frontal parenchymal intracerebral hemorrhage due to his small AVM in July 2017with symptomatic simple partial seizures in August 2017. 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 I recommend he stay on Keppra 500 mg twice daily which is tolerating well without side effects. I advised him to avoid seizure provoking stimuli like sleep deprivation, medication noncompliance and alcohol use. I also encouraged him to see Dr. Estanislado Pandy to discuss endovascular treatment for his AVM. I recommend starting Topamax 50 mg daily for 1 week and increase if tolerated without side effects were twice daily to help with his almost daily headaches. Repeat MRI scan of the brain with and without contrast to follow his AVM and worsening headaches.. I advised him to see his primary care physician to discuss treatment for his allergies and sinuses Return for follow-up to see me in 3 months or call earlier if necessary. Greater than 50% of time during this 25 minute visit was spent on counseling,explanation of diagnosis, planning of further management, discussion with patient and family and coordination of care Antony Contras, MD Medical Director [place] Pager: [phone] 04/08/2016 5:10 PM Note: This document was prepared with digital dictation and possible smart phrase technology. Any transcriptional errors that result from this process are unintentional

## 2016-04-10 ENCOUNTER — Encounter: Payer: Self-pay | Admitting: Speech Pathology

## 2016-04-10 NOTE — Therapy (Signed)
[facility] [address] [place], [place], [address] Phone: [phone]   Fax:  [phone]  Patient Details  Name: Timothy Maddox MRN: [id number] Date of Birth: March 04, 1946 Referring Provider:  No ref. provider found  Encounter Date: 04/10/2016   SPEECH THERAPY DISCHARGE SUMMARY  Visits from Start of Care: 1  Current functional level related to goals / functional outcomes: See goals below   Remaining deficits: Dysarthria   Education / Equipment: Goals for ST, compensations for dysarthria  Pt did not return after initial evaluation Plan: Patient agrees to discharge.  Patient goals were not met. Patient is being discharged due to not returning since the last visit.  ?????        SLP Short Term Goals - 11/29/15 1522      SLP SHORT TERM GOAL #1   Title Pt will perform HEP for dysarthria with occasional min A   Time 4   Period Weeks   Status Not met     SLP SHORT TERM GOAL #2   Title Pt will utlize compensations for dysarthria in structured speech tasks with 80% accuracy and occasional min A   Time 4   Period Weeks   Status Not met     SLP SHORT TERM GOAL #3   Title Pt will participate in simple conversation using compensations to be 90% intellgible over 5 minutes   Time 4   Period Weeks   Status Not met         SLP Long Term Goals - 11/29/15 1524      SLP LONG TERM GOAL #1   Title Pt will be 95% intellgible over 12 minute moderately complex conversation with rare min A   Time 8   Period Weeks   Status Not met     SLP LONG TERM GOAL #2   Title Pt will be 95% intellgible in noisy environment outside of therapy room over 12 minute conversation with rare min A   Time 8   Period Weeks   Status Not met        Alice Rieger Annye Rusk 04/10/2016, 12:19 PM  [facility] [address] [place], [place], [address] Phone: [phone]   Fax:   [phone]

## 2016-04-11 ENCOUNTER — Ambulatory Visit: Payer: Medicare HMO | Admitting: Neurology

## 2016-04-24 ENCOUNTER — Inpatient Hospital Stay: Admission: RE | Admit: 2016-04-24 | Payer: Medicare HMO | Source: Ambulatory Visit

## 2016-05-07 ENCOUNTER — Ambulatory Visit: Payer: Medicare HMO | Admitting: Neurology

## 2016-06-19 ENCOUNTER — Telehealth (HOSPITAL_COMMUNITY): Payer: Self-pay

## 2016-06-19 NOTE — Telephone Encounter (Signed)
Pt stated that he does not want to schedule angiogram right now. He doesn't have any transportation right now and says that he has been feeling ok. He will give [place] a call when he is ready to schedule. AW

## 2016-07-03 ENCOUNTER — Other Ambulatory Visit: Payer: Self-pay

## 2016-07-03 ENCOUNTER — Telehealth: Payer: Self-pay | Admitting: Neurology

## 2016-07-03 DIAGNOSIS — R51 Headache: Principal | ICD-10-CM

## 2016-07-03 DIAGNOSIS — R519 Headache, unspecified: Secondary | ICD-10-CM

## 2016-07-03 MED ORDER — TOPIRAMATE 50 MG PO TABS: 50.0000 mg | ORAL_TABLET | Freq: Two times a day (BID) | ORAL | 0 refills | Status: DC

## 2016-07-03 NOTE — Telephone Encounter (Signed)
Rn spoke with [place] with [facility] about pts topamax. Rn stated a 90 day supply can be done for the pt. Rn also stated the pt never schedule a follow up appt with Dr.Sethi per his note. Refill done for 90 days. Vaughan Basta will verbalized understanding and put a note on pts medications about calling [facility] to schedule follow up. Rn stated pt will only get 92months and no refills. Pt needs appt once the refills run out.

## 2016-07-03 NOTE — Telephone Encounter (Signed)
Timothy Maddox with [employer] is calling regarding medication topiramate (TOPAMAX) 50 MG tablet. She wants to know if it is alright to give patient #90 instead of #30. Please call and advise.

## 2016-12-03 ENCOUNTER — Encounter: Payer: Self-pay | Admitting: Urology

## 2016-12-03 ENCOUNTER — Ambulatory Visit (INDEPENDENT_AMBULATORY_CARE_PROVIDER_SITE_OTHER): Payer: Medicare HMO | Admitting: Urology

## 2016-12-03 VITALS — BP 110/68 | HR 60 | Ht 72.0 in | Wt 276.9 lb

## 2016-12-03 DIAGNOSIS — C61 Malignant neoplasm of prostate: Secondary | ICD-10-CM | POA: Diagnosis not present

## 2016-12-03 NOTE — Progress Notes (Signed)
12/03/2016 3:52 PM   Timothy Maddox 1946-04-21 [id number]  Referring provider: [name], MD [place], [place] [address]  Chief Complaint  Patient presents with  . 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 Marian Sorrow at [facility] and then by Dr. Christiane Ha in [facility] May 2018. I reviewed the notes. 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 MRI was negative for metastasis but showed T3/ECE on the right side, bone scan was normal. He started Lupron October 2013 and completed IMRT February 2014 with 18 months ADT. Last GU note from May 2018 mentions a PSA rise and to consider staging. Pt thinks it was about 10.   He has minimal LUTS. No new pain.   He has ED and uses isosorbide. Not a candidate for PDE 5 meds. His VED works.  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 He has HTN.    PMH: Past Medical History:  Diagnosis Date  . Arthritis    lower back, shoulders  . AVM (arteriovenous malformation) brain   . Cancer [facility])    prostate- treated with radiation  . CVA (cerebral infarction) 11/2015  . Dyspnea    with exertion  . HOH (hard of hearing)   . Hyperlipidemia   . Hypertension   . Pneumonia    1st grade  . Pre-diabetes   . Prediabetes   . Seizures ([place]) 12/2015  . Stroke [facility])    speech impairment, no weakness    Surgical History: Past Surgical History:  Procedure Laterality Date  . BACK SURGERY  1999   disectomy  . BACK SURGERY     2 surgery- injury  . COLONOSCOPY W/ POLYPECTOMY    . HAMMER TOE SURGERY Bilateral   . INGUINAL HERNIA REPAIR Right    per patient when he was in 1st grade  . IR GENERIC HISTORICAL  12/05/2015   IR RADIOLOGIST EVAL & MGMT 12/05/2015 [facility]  . IR GENERIC HISTORICAL  01/31/2016   IR RADIOLOGIST EVAL & MGMT 01/31/2016 [facility]  . IR GENERIC HISTORICAL   02/20/2016   IR ANGIO INTRA EXTRACRAN SEL COM CAROTID INNOMINATE UNI L MOD SED 02/20/2016 [name], MD [facility]  . IR GENERIC HISTORICAL  02/20/2016   IR 3D INDEPENDENT WKST 02/20/2016 [name], MD [facility]  . KNEE SURGERY Bilateral 2015  . RADIOLOGY WITH ANESTHESIA N/A 02/20/2016   Procedure: EMBOLIZATION;  Surgeon: [name], MD;  Location: [facility];  Service: Radiology;  Laterality: N/A;  . TOE SURGERY Left 2017   TOE NAIL REMOVAL    Home Medications:  Allergies as of 12/03/2016      Reactions   No Known Allergies       Medication List       Accurate as of 12/03/16  3:52 PM. Always use your most recent med list.          acetaminophen 500 MG tablet Commonly known as:  TYLENOL Take 500 mg by mouth every 6 (six) hours as needed.   amLODipine 10 MG tablet Commonly known as:  NORVASC Take 10 mg by mouth daily.   ciclopirox 8 % solution Commonly known as:  PENLAC Apply 1 application topically. Applied to toe nail.   levETIRAcetam 500 MG tablet Commonly known  as:  KEPPRA Take 1 tablet (500 mg total) by mouth 2 (two) times daily.   losartan-hydrochlorothiazide 100-12.5 MG tablet Commonly known as:  HYZAAR Take 1 tablet by mouth daily.   metoprolol tartrate 100 MG tablet Commonly known as:  LOPRESSOR Take 100 mg by mouth 2 (two) times daily.   rosuvastatin 10 MG tablet Commonly known as:  CRESTOR Take 10 mg by mouth every evening.   spironolactone 100 MG tablet Commonly known as:  ALDACTONE Take 100 mg by mouth 2 (two) times daily.   topiramate 50 MG tablet Commonly known as:  TOPAMAX Take 1 tablet (50 mg total) by mouth 2 (two) times daily. Start one tablet daily x 1 week then twice daily       Allergies:  Allergies  Allergen Reactions  . No Known Allergies     Family History: Family History  Problem Relation Age of Onset  . Heart failure Father   . Diabetes Father   . Hypertension Father   . Alzheimer's disease  Father 62  . Other Mother 64       homicide    Social History:  reports that he has quit smoking. He quit after 2.00 years of use. He has never used smokeless tobacco. He reports that he does not drink alcohol or use drugs.  ROS: UROLOGY Frequent Urination?: No Hard to postpone urination?: No Burning/pain with urination?: No Get up at night to urinate?: Yes Leakage of urine?: No Urine stream starts and stops?: No Trouble starting stream?: No Do you have to strain to urinate?: No Blood in urine?: No Urinary tract infection?: No Sexually transmitted disease?: No Injury to kidneys or bladder?: No Painful intercourse?: No Weak stream?: No Erection problems?: Yes Penile pain?: No  Gastrointestinal Nausea?: No Vomiting?: No Indigestion/heartburn?: Yes Diarrhea?: No Constipation?: No  Constitutional Fever: No Night sweats?: No Weight loss?: No Fatigue?: No  Skin Skin rash/lesions?: No Itching?: Yes  Eyes Blurred vision?: Yes Double vision?: No  Ears/Nose/Throat Sore throat?: No Sinus problems?: Yes  Hematologic/Lymphatic Swollen glands?: No Easy bruising?: Yes  Cardiovascular Leg swelling?: No Chest pain?: No  Respiratory Cough?: Yes Shortness of breath?: No  Endocrine Excessive thirst?: No  Musculoskeletal Back pain?: Yes Joint pain?: Yes  Neurological Headaches?: No Dizziness?: No  Psychologic Depression?: No Anxiety?: No  Physical Exam: BP 110/68 (BP Location: Left Arm, Patient Position: Sitting, Cuff Size: Normal)   Pulse 60   Ht 6' (1.829 m)   Wt 125.6 kg (276 lb 14.4 oz)   BMI 37.55 kg/m   Constitutional:  Alert and oriented, No acute distress. HEENT: [place] AT, moist mucus membranes.  Trachea midline, no masses. Cardiovascular: No clubbing, cyanosis, or edema. Respiratory: Normal respiratory effort, no increased work of breathing. GI: Abdomen is soft, nontender, nondistended, no abdominal masses Skin: No rashes, bruises or  suspicious lesions. Neurologic: Grossly intact, no focal deficits, moving all 4 extremities. Psychiatric: Normal mood and affect.  Laboratory Data: Lab Results  Component Value Date   WBC 5.9 02/15/2016   HGB 16.0 02/15/2016   HCT 45.7 02/15/2016   MCV 87.5 02/15/2016   PLT 192 02/15/2016    Lab Results  Component Value Date   CREATININE 1.40 (H) 02/20/2016    No results found for: PSA  No results found for: TESTOSTERONE  Lab Results  Component Value Date   HGBA1C 6.3 (H) 02/15/2016    Urinalysis    Component Value Date/Time   COLORURINE YELLOW (A) 11/13/2015 1357   [place] (  A) 11/13/2015 1357   LABSPEC 1.017 11/13/2015 1357   PHURINE 5.0 11/13/2015 1357   GLUCOSEU NEGATIVE 11/13/2015 1357   HGBUR 1+ (A) 11/13/2015 1357   BILIRUBINUR NEGATIVE 11/13/2015 1357   KETONESUR NEGATIVE 11/13/2015 1357   PROTEINUR NEGATIVE 11/13/2015 1357   NITRITE NEGATIVE 11/13/2015 1357   LEUKOCYTESUR NEGATIVE 11/13/2015 1357     Assessment & Plan:    PCa - I sent PSA, testosterone and CMP. Will set up staging and f/u. If staging positive discussed ADT with xtandi or eurleda down the road. If staging negative discussed another attempt at cure with cryo, but with a high gleason score and if PSA is above 10 he's not a good cryo candidate.  There are no diagnoses linked to this encounter.  No Follow-up on file.  Festus Aloe, [facility] [address] [place], [place] [address] [phone]

## 2016-12-04 ENCOUNTER — Telehealth: Payer: Self-pay

## 2016-12-04 DIAGNOSIS — C61 Malignant neoplasm of prostate: Secondary | ICD-10-CM

## 2016-12-04 LAB — COMPREHENSIVE METABOLIC PANEL
ALT: 22 IU/L (ref 0–44)
AST: 20 IU/L (ref 0–40)
Albumin/Globulin Ratio: 1.4 (ref 1.2–2.2)
Albumin: 4.5 g/dL (ref 3.5–4.8)
Alkaline Phosphatase: 47 IU/L (ref 39–117)
BUN/Creatinine Ratio: 14 (ref 10–24)
BUN: 18 mg/dL (ref 8–27)
Bilirubin Total: 0.3 mg/dL (ref 0.0–1.2)
CO2: 22 mmol/L (ref 20–29)
Calcium: 10.1 mg/dL (ref 8.6–10.2)
Chloride: 99 mmol/L (ref 96–106)
Creatinine, Ser: 1.3 mg/dL — ABNORMAL HIGH (ref 0.76–1.27)
GFR calc Af Amer: 64 mL/min/{1.73_m2} (ref >59)
GFR calc non Af Amer: 55 mL/min/{1.73_m2} — ABNORMAL LOW (ref >59)
Globulin, Total: 3.2 g/dL (ref 1.5–4.5)
Glucose: 95 mg/dL (ref 65–99)
Potassium: 4.1 mmol/L (ref 3.5–5.2)
Sodium: 140 mmol/L (ref 134–144)
Total Protein: 7.7 g/dL (ref 6.0–8.5)

## 2016-12-04 LAB — TESTOSTERONE: Testosterone: 53 ng/dL — ABNORMAL LOW (ref 264–916)

## 2016-12-04 LAB — PSA: PROSTATE SPECIFIC AG, SERUM: 9.3 ng/mL — AB (ref 0.0–4.0)

## 2016-12-04 NOTE — Telephone Encounter (Signed)
Patient notified of results and orders placed for scans  Please schedule follow up after scans Thanks  Patient will be out of town from 8/17 through 8/21 please schedule scans before or after these dates-thanks Per patient please contact daughter to schedule apt she drives him Timothy Maddox [phone]

## 2016-12-04 NOTE — Telephone Encounter (Signed)
-----   Message from Festus Aloe, MD sent at 12/04/2016 10:50 AM EDT ----- Notify patient his PSA looks stable at 9.3. He needs a BONE SCAN and a CT ABD and PELVIS WITH IV CONTRAST and to f/u to review those studies.    ----- Message ----- From: Royanne Foots, [place] Sent: 12/04/2016   9:37 AM To: Festus Aloe, MD    ----- Message ----- From: Lavone Neri Lab Results In Sent: 12/04/2016   5:42 AM To: Rowe Robert Clinical

## 2016-12-10 NOTE — Telephone Encounter (Signed)
done

## 2016-12-18 ENCOUNTER — Ambulatory Visit
Admission: RE | Admit: 2016-12-18 | Discharge: 2016-12-18 | Disposition: A | Discharge status: Home / Self Care | Payer: Medicare HMO | Source: Ambulatory Visit | Attending: Urology | Admitting: Urology

## 2016-12-18 ENCOUNTER — Encounter
Admission: RE | Admit: 2016-12-18 | Discharge: 2016-12-18 | Disposition: A | Discharge status: Home / Self Care | Payer: Medicare HMO | Source: Ambulatory Visit | Attending: Urology | Admitting: Urology

## 2016-12-18 DIAGNOSIS — K8020 Calculus of gallbladder without cholecystitis without obstruction: Secondary | ICD-10-CM | POA: Insufficient documentation

## 2016-12-18 DIAGNOSIS — E278 Other specified disorders of adrenal gland: Secondary | ICD-10-CM | POA: Insufficient documentation

## 2016-12-18 DIAGNOSIS — K760 Fatty (change of) liver, not elsewhere classified: Secondary | ICD-10-CM | POA: Insufficient documentation

## 2016-12-18 DIAGNOSIS — I700 Atherosclerosis of aorta: Secondary | ICD-10-CM | POA: Diagnosis not present

## 2016-12-18 DIAGNOSIS — C61 Malignant neoplasm of prostate: Secondary | ICD-10-CM | POA: Diagnosis not present

## 2016-12-18 MED ORDER — TECHNETIUM TC 99M MEDRONATE IV KIT
25.0000 | PACK | Freq: Once | INTRAVENOUS | Status: AC | PRN
  Administered 2016-12-18: 22.92 via INTRAVENOUS

## 2016-12-18 MED ORDER — IOPAMIDOL (ISOVUE-300) INJECTION 61%
100.0000 mL | Freq: Once | INTRAVENOUS | Status: AC | PRN
  Administered 2016-12-18: 100 mL via INTRAVENOUS

## 2016-12-19 ENCOUNTER — Telehealth: Payer: Self-pay

## 2016-12-19 NOTE — Telephone Encounter (Signed)
Festus Aloe, MD  Toniann Fail C, LPN        Also, bone scan negative -- thanks!    Festus Aloe, MD  Lestine Box, LPN        Notify patient -- CT scan negative for any prostate cancer spread. Looks good.    Spoke with pt in reference to CT and bone scan results. Pt voiced understanding.

## 2017-01-08 ENCOUNTER — Ambulatory Visit (INDEPENDENT_AMBULATORY_CARE_PROVIDER_SITE_OTHER): Payer: Medicare HMO | Admitting: Urology

## 2017-01-08 VITALS — BP 136/81 | HR 53 | Ht 73.0 in | Wt 278.5 lb

## 2017-01-08 DIAGNOSIS — C61 Malignant neoplasm of prostate: Secondary | ICD-10-CM | POA: Diagnosis not present

## 2017-01-08 MED ORDER — LEUPROLIDE ACETATE (3 MONTH) 22.5 MG IM KIT
22.5000 mg | PACK | Freq: Once | INTRAMUSCULAR | Status: AC
  Administered 2017-01-08: 22.5 mg via INTRAMUSCULAR

## 2017-01-08 NOTE — Progress Notes (Signed)
01/08/2017 3:50 PM   Timothy Maddox [birth year] [id number]  Referring provider: [name], MD [place], [place] [address]  No chief complaint on file.   HPI: The patient is a 71 year old gentleman who presents for follow-up of his prostate cancer.  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 Marian Sorrow at [facility] and then by Dr. Christiane Ha in [facility] May 2018. I reviewed the notes. 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 MRI was negative for metastasis but showed T3/ECE on the right side, bone scan was normal. He started Lupron October 2013 and completed IMRT February 2014 with 18 months ADT. Last GU note from May 2018 mentions a PSA rise and to consider staging. Pt thinks it was about 10. Repeat PSA in August 2018 was 9.3. CT/bone scan negative for metastatic disease. Testosterone was 53 in August 2018.  He has minimal LUTS. No new pain.   He has ED and uses isosorbide. Not a candidate for PDE 5 meds. His VED works.  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 He has HTN.     PMH: Past Medical History:  Diagnosis Date  . Arthritis    lower back, shoulders  . AVM (arteriovenous malformation) brain   . Cancer [facility])    prostate- treated with radiation  . CVA (cerebral infarction) 11/2015  . Dyspnea    with exertion  . HOH (hard of hearing)   . Hyperlipidemia   . Hypertension   . Pneumonia    1st grade  . Pre-diabetes   . Prediabetes   . Seizures ([place]) 12/2015  . Stroke [facility])    speech impairment, no weakness    Surgical History: Past Surgical History:  Procedure Laterality Date  . BACK SURGERY  1999   disectomy  . BACK SURGERY     2 surgery- injury  . COLONOSCOPY W/ POLYPECTOMY    . HAMMER TOE SURGERY Bilateral   . INGUINAL HERNIA REPAIR Right    per patient when he was in 1st grade  . IR GENERIC HISTORICAL  12/05/2015   IR  RADIOLOGIST EVAL & MGMT 12/05/2015 [facility]  . IR GENERIC HISTORICAL  01/31/2016   IR RADIOLOGIST EVAL & MGMT 01/31/2016 [facility]  . IR GENERIC HISTORICAL  02/20/2016   IR ANGIO INTRA EXTRACRAN SEL COM CAROTID INNOMINATE UNI L MOD SED 02/20/2016 [name], MD [facility]  . IR GENERIC HISTORICAL  02/20/2016   IR 3D INDEPENDENT WKST 02/20/2016 [name], MD [facility]  . KNEE SURGERY Bilateral 2015  . RADIOLOGY WITH ANESTHESIA N/A 02/20/2016   Procedure: EMBOLIZATION;  Surgeon: [name], MD;  Location: [place];  Service: Radiology;  Laterality: N/A;  . TOE SURGERY Left 2017   TOE NAIL REMOVAL    Home Medications:  Allergies as of 01/08/2017      Reactions   No Known Allergies    Poison Ivy Extract       Medication List       Accurate as of 01/08/17  3:50 PM. Always use your most recent med list.          acetaminophen 500 MG tablet Commonly known as:  TYLENOL Take 500 mg by mouth every 6 (six) hours as needed.   amLODipine 10 MG tablet Commonly known as:  NORVASC Take 10 mg by mouth daily.  ciclopirox 8 % solution Commonly known as:  PENLAC Apply 1 application topically. Applied to toe nail.   levETIRAcetam 500 MG tablet Commonly known as:  KEPPRA Take 1 tablet (500 mg total) by mouth 2 (two) times daily.   losartan-hydrochlorothiazide 100-12.5 MG tablet Commonly known as:  HYZAAR Take 1 tablet by mouth daily.   metoprolol tartrate 100 MG tablet Commonly known as:  LOPRESSOR Take 100 mg by mouth 2 (two) times daily.   rosuvastatin 10 MG tablet Commonly known as:  CRESTOR Take 10 mg by mouth every evening.   spironolactone 100 MG tablet Commonly known as:  ALDACTONE Take 100 mg by mouth 2 (two) times daily.   topiramate 50 MG tablet Commonly known as:  TOPAMAX Take 1 tablet (50 mg total) by mouth 2 (two) times daily. Start one tablet daily x 1 week then twice daily            Discharge Care Instructions         Start     Ordered   01/08/17 0000  PSA     01/08/17 1549   01/08/17 0000  Testosterone     01/08/17 1549      Allergies:  Allergies  Allergen Reactions  . No Known Allergies   . Poison Ivy Extract     Family History: Family History  Problem Relation Age of Onset  . Heart failure Father   . Diabetes Father   . Hypertension Father   . Alzheimer's disease Father 57  . Other Mother 32       homicide    Social History:  reports that he has quit smoking. He quit after 2.00 years of use. He has never used smokeless tobacco. He reports that he does not drink alcohol or use drugs.  ROS: UROLOGY Frequent Urination?: Yes Hard to postpone urination?: No Burning/pain with urination?: No Get up at night to urinate?: No Leakage of urine?: No Urine stream starts and stops?: No Trouble starting stream?: No Do you have to strain to urinate?: No Blood in urine?: Yes Urinary tract infection?: No Sexually transmitted disease?: No Injury to kidneys or bladder?: No Painful intercourse?: No Weak stream?: No Erection problems?: Yes Penile pain?: No  Gastrointestinal Nausea?: No Vomiting?: No Indigestion/heartburn?: No Diarrhea?: No Constipation?: No  Constitutional Fever: No Night sweats?: No Weight loss?: No Fatigue?: No  Skin Skin rash/lesions?: No Itching?: No  Eyes Blurred vision?: Yes Double vision?: No  Ears/Nose/Throat Sore throat?: No Sinus problems?: No  Hematologic/Lymphatic Swollen glands?: No Easy bruising?: No  Cardiovascular Leg swelling?: No Chest pain?: No  Respiratory Cough?: Yes Shortness of breath?: No  Endocrine Excessive thirst?: No  Musculoskeletal Back pain?: No Joint pain?: No  Neurological Headaches?: No Dizziness?: No  Psychologic Depression?: No Anxiety?: No  Physical Exam: BP 136/81 (BP Location: Left Arm, Patient Position: Sitting, Cuff Size: Large)   Pulse (!) 53   Ht 6\' 1"  (1.854 m)   Wt 278 lb 8 oz (126.3  kg)   BMI 36.74 kg/m   Constitutional:  Alert and oriented, No acute distress. HEENT: [place] AT, moist mucus membranes.  Trachea midline, no masses. Cardiovascular: No clubbing, cyanosis, or edema. Respiratory: Normal respiratory effort, no increased work of breathing. GI: Abdomen is soft, nontender, nondistended, no abdominal masses GU: No CVA tenderness.  Skin: No rashes, bruises or suspicious lesions. Lymph: No cervical or inguinal adenopathy. Neurologic: Grossly intact, no focal deficits, moving all 4 extremities. Psychiatric: Normal mood and affect.  Laboratory Data:  Lab Results  Component Value Date   WBC 5.9 02/15/2016   HGB 16.0 02/15/2016   HCT 45.7 02/15/2016   MCV 87.5 02/15/2016   PLT 192 02/15/2016    Lab Results  Component Value Date   CREATININE 1.30 (H) 12/03/2016    No results found for: PSA  Lab Results  Component Value Date   TESTOSTERONE 53 (L) 12/03/2016    Lab Results  Component Value Date   HGBA1C 6.3 (H) 02/15/2016    Urinalysis    Component Value Date/Time   COLORURINE YELLOW (A) 11/13/2015 1357   APPEARANCEUR CLEAR (A) 11/13/2015 1357   LABSPEC 1.017 11/13/2015 1357   PHURINE 5.0 11/13/2015 1357   GLUCOSEU NEGATIVE 11/13/2015 1357   HGBUR 1+ (A) 11/13/2015 1357   BILIRUBINUR NEGATIVE 11/13/2015 1357   KETONESUR NEGATIVE 11/13/2015 1357   PROTEINUR NEGATIVE 11/13/2015 1357   NITRITE NEGATIVE 11/13/2015 1357   LEUKOCYTESUR NEGATIVE 11/13/2015 1357    Assessment & Plan:    1. Biochemical recurrence of prostate cancer I discussed the patient that he has biochemical recurrence of his prostate cancer with a negative metastatic workup. Though I did show erythema concern that he is at high risk for micrometastatic disease at this time. He is currently hormone nave though his testosterone is relatively low to begin with 53. At this point we discussed initiating indefinite androgen deprivation therapy. He has been on this medication before  surgery and is familiar with it. He was advised to start vitamin D and calcium for bone health. We also discussed the risks and side effects of being on androgen deprivation therapy detail. We will start him on a 3 month dose of Lupron today. He'll follow-up in 3 months with PSA and testosterone prior. Depending on how he responds, we may be able to extend his dosing interval to 6 months at that time.  Return in about 3 months (around 04/09/2017) for for lupron with PSA/testosterone prior.  Nickie Retort, MD  [facility] [address], [place], [place] [address] [phone]

## 2017-01-08 NOTE — Addendum Note (Signed)
Addended by: Leia Alf on: 01/08/2017 03:58 PM   Modules accepted: Orders

## 2017-03-10 ENCOUNTER — Other Ambulatory Visit: Payer: Self-pay | Admitting: Neurology

## 2017-04-09 ENCOUNTER — Other Ambulatory Visit: Payer: Medicare HMO

## 2017-04-09 DIAGNOSIS — C61 Malignant neoplasm of prostate: Secondary | ICD-10-CM

## 2017-04-10 LAB — PSA: Prostate Specific Ag, Serum: 4.9 ng/mL — ABNORMAL HIGH (ref 0.0–4.0)

## 2017-04-10 LAB — TESTOSTERONE: Testosterone: 14 ng/dL — ABNORMAL LOW (ref 264–916)

## 2017-04-16 ENCOUNTER — Encounter: Payer: Self-pay | Admitting: Urology

## 2017-04-16 ENCOUNTER — Ambulatory Visit: Payer: Medicare HMO | Admitting: Urology

## 2017-04-16 VITALS — BP 129/82 | HR 60 | Ht 73.0 in | Wt 280.9 lb

## 2017-04-16 DIAGNOSIS — C61 Malignant neoplasm of prostate: Secondary | ICD-10-CM

## 2017-04-16 MED ORDER — LEUPROLIDE ACETATE (3 MONTH) 22.5 MG IM KIT
22.5000 mg | PACK | Freq: Once | INTRAMUSCULAR | Status: AC
  Administered 2017-04-16: 22.5 mg via INTRAMUSCULAR

## 2017-04-16 NOTE — Progress Notes (Signed)
04/16/2017 3:10 PM   Timothy Maddox 05-07-1945 [id number]  Referring provider: [name], MD [place], [place] [address]  Chief Complaint  Patient presents with  . Prostate Cancer    HPI: The patient is a 71 year old gentleman who presents for follow-up of his prostate cancer.  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 Marian Sorrow at [facility] and then by Dr. Christiane Ha in [facility] May 2018. I reviewed the notes. 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 MRI was negative for metastasis but showed T3/ECE on the right side, bone scan was normal. He started Lupron October 2013 and completed IMRT February 2014 with 18 months ADT. Last GU note from May 2018 mentions a PSA rise and to consider staging. Pt thinks it was about 10. Repeat PSA in August 2018 was 9.3. CT/bone scan negative for metastatic disease. Testosterone was 53 in August 2018.  He was started on a 20-month Lupron shot at that time.  His PSA fell to 4.9 in December 2018.  His testosterone for the 14.  He has minimal LUTS. No new pain.   Since starting Lupron 3 months ago, the patient notes increase in his fatigue.  He has no new pain in his spine or pelvis.  He has no other complaints.  His daughter notes that he has fatigue prior to starting the shots per  He has ED and uses isosorbide. Not a candidate for PDE 5 meds. His VED works.  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 He has HTN.         PMH: Past Medical History:  Diagnosis Date  . Arthritis    lower back, shoulders  . AVM (arteriovenous malformation) brain   . Cancer [facility])    prostate- treated with radiation  . CVA (cerebral infarction) 11/2015  . Dyspnea    with exertion  . HOH (hard of hearing)   . Hyperlipidemia   . Hypertension   . Pneumonia    1st grade  . Pre-diabetes   . Prediabetes   . Seizures ([place]) 12/2015  .  Stroke [facility])    speech impairment, no weakness    Surgical History: Past Surgical History:  Procedure Laterality Date  . BACK SURGERY  1999   disectomy  . BACK SURGERY     2 surgery- injury  . COLONOSCOPY W/ POLYPECTOMY    . HAMMER TOE SURGERY Bilateral   . INGUINAL HERNIA REPAIR Right    per patient when he was in 1st grade  . IR GENERIC HISTORICAL  12/05/2015   IR RADIOLOGIST EVAL & MGMT 12/05/2015 [facility]  . IR GENERIC HISTORICAL  01/31/2016   IR RADIOLOGIST EVAL & MGMT 01/31/2016 [facility]  . IR GENERIC HISTORICAL  02/20/2016   IR ANGIO INTRA EXTRACRAN SEL COM CAROTID INNOMINATE UNI L MOD SED 02/20/2016 [name], MD [facility]  . IR GENERIC HISTORICAL  02/20/2016   IR 3D INDEPENDENT WKST 02/20/2016 [name], MD [facility]  . KNEE SURGERY Bilateral 2015  . RADIOLOGY WITH ANESTHESIA N/A 02/20/2016   Procedure: EMBOLIZATION;  Surgeon: [name], MD;  Location: [place];  Service: Radiology;  Laterality: N/A;  . TOE SURGERY Left 2017   TOE NAIL REMOVAL    Home Medications:  Allergies as of 04/16/2017      Reactions   No  Known Allergies    Poison Ivy Extract       Medication List        Accurate as of 04/16/17  3:10 PM. Always use your most recent med list.          acetaminophen 500 MG tablet Commonly known as:  TYLENOL Take 500 mg by mouth every 6 (six) hours as needed.   amLODipine 10 MG tablet Commonly known as:  NORVASC Take 10 mg by mouth daily.   ciclopirox 8 % solution Commonly known as:  PENLAC Apply 1 application topically. Applied to toe nail.   levETIRAcetam 500 MG tablet Commonly known as:  KEPPRA Take 1 tablet (500 mg total) by mouth 2 (two) times daily.   losartan-hydrochlorothiazide 100-12.5 MG tablet Commonly known as:  HYZAAR Take 1 tablet by mouth daily.   metoprolol tartrate 100 MG tablet Commonly known as:  LOPRESSOR Take 100 mg by mouth 2 (two) times daily.   rosuvastatin 10 MG tablet Commonly  known as:  CRESTOR Take 10 mg by mouth every evening.   spironolactone 100 MG tablet Commonly known as:  ALDACTONE Take 100 mg by mouth 2 (two) times daily.   topiramate 50 MG tablet Commonly known as:  TOPAMAX Take 1 tablet (50 mg total) by mouth 2 (two) times daily. Start one tablet daily x 1 week then twice daily       Allergies:  Allergies  Allergen Reactions  . No Known Allergies   . Poison Ivy Extract     Family History: Family History  Problem Relation Age of Onset  . Heart failure Father   . Diabetes Father   . Hypertension Father   . Alzheimer's disease Father 21  . Other Mother 8       homicide    Social History:  reports that he has quit smoking. He quit after 2.00 years of use. he has never used smokeless tobacco. He reports that he does not drink alcohol or use drugs.  ROS: UROLOGY Frequent Urination?: No Hard to postpone urination?: No Burning/pain with urination?: No Get up at night to urinate?: No Leakage of urine?: No Urine stream starts and stops?: No Trouble starting stream?: No Do you have to strain to urinate?: No Blood in urine?: No Urinary tract infection?: No Sexually transmitted disease?: No Injury to kidneys or bladder?: No Painful intercourse?: No Weak stream?: No Erection problems?: No Penile pain?: No  Gastrointestinal Nausea?: No Vomiting?: No Indigestion/heartburn?: No Diarrhea?: No Constipation?: No  Constitutional Fever: No Night sweats?: No Weight loss?: No Fatigue?: No  Skin Skin rash/lesions?: No Itching?: No  Eyes Blurred vision?: Yes Double vision?: No  Ears/Nose/Throat Sore throat?: No Sinus problems?: Yes  Hematologic/Lymphatic Swollen glands?: No Easy bruising?: No  Cardiovascular Leg swelling?: No Chest pain?: No  Respiratory Cough?: Yes Shortness of breath?: No  Endocrine Excessive thirst?: No  Musculoskeletal Back pain?: Yes Joint pain?: No  Neurological Headaches?:  No Dizziness?: No  Psychologic Depression?: No Anxiety?: No  Physical Exam: BP 129/82 (BP Location: Right Arm, Patient Position: Sitting, Cuff Size: Normal)   Pulse 60   Ht 6\' 1"  (1.854 m)   Wt 280 lb 14.4 oz (127.4 kg)   BMI 37.06 kg/m   Constitutional:  Alert and oriented, No acute distress. HEENT:  AT, moist mucus membranes.  Trachea midline, no masses. Cardiovascular: No clubbing, cyanosis, or edema. Respiratory: Normal respiratory effort, no increased work of breathing. GI: Abdomen is soft, nontender, nondistended, no abdominal masses GU: No  CVA tenderness.  Skin: No rashes, bruises or suspicious lesions. Lymph: No cervical or inguinal adenopathy. Neurologic: Grossly intact, no focal deficits, moving all 4 extremities. Psychiatric: Normal mood and affect.  Laboratory Data: Lab Results  Component Value Date   WBC 5.9 02/15/2016   HGB 16.0 02/15/2016   HCT 45.7 02/15/2016   MCV 87.5 02/15/2016   PLT 192 02/15/2016    Lab Results  Component Value Date   CREATININE 1.30 (H) 12/03/2016    No results found for: PSA  Lab Results  Component Value Date   TESTOSTERONE 14 (L) 04/09/2017    Lab Results  Component Value Date   HGBA1C 6.3 (H) 02/15/2016    Urinalysis    Component Value Date/Time   COLORURINE YELLOW (A) 11/13/2015 1357   APPEARANCEUR CLEAR (A) 11/13/2015 1357   LABSPEC 1.017 11/13/2015 1357   PHURINE 5.0 11/13/2015 1357   GLUCOSEU NEGATIVE 11/13/2015 1357   HGBUR 1+ (A) 11/13/2015 1357   BILIRUBINUR NEGATIVE 11/13/2015 1357   KETONESUR NEGATIVE 11/13/2015 1357   PROTEINUR NEGATIVE 11/13/2015 1357   NITRITE NEGATIVE 11/13/2015 1357   LEUKOCYTESUR NEGATIVE 11/13/2015 1357    Assessment & Plan:    1. Biochemical recurrence of prostate cancer -Due for repeat lupron shot at this time.  We will do a 3 months at this time to continue to closely watch his PSA. -We will follow-up in 3 months with PSA and testosterone prior -Continue vitamin D  and calcium.  Patient reports he is taking. -The patient's fatigue may be related to the shots though his daughter feels that he had this prior.  I am not completely convinced the ADT is the only source of his fatigue as his testosterone was fairly low to begin with at 53.   Return for PSA prior.  Nickie Retort, MD  [facility] [address], [place], [place] [address] [phone]

## 2017-06-02 ENCOUNTER — Other Ambulatory Visit: Payer: Self-pay | Admitting: Orthopedic Surgery

## 2017-06-02 DIAGNOSIS — M1711 Unilateral primary osteoarthritis, right knee: Secondary | ICD-10-CM

## 2017-06-08 ENCOUNTER — Ambulatory Visit: Payer: Medicare HMO

## 2017-06-11 ENCOUNTER — Ambulatory Visit: Payer: Medicare HMO

## 2017-06-24 ENCOUNTER — Ambulatory Visit: Payer: Self-pay | Admitting: Orthopedic Surgery

## 2017-07-08 ENCOUNTER — Other Ambulatory Visit: Payer: Self-pay

## 2017-07-08 ENCOUNTER — Encounter
Admission: RE | Admit: 2017-07-08 | Discharge: 2017-07-08 | Disposition: A | Discharge status: Home / Self Care | Payer: Medicare HMO | Source: Ambulatory Visit | Attending: Orthopedic Surgery | Admitting: Orthopedic Surgery

## 2017-07-08 DIAGNOSIS — Z8546 Personal history of malignant neoplasm of prostate: Secondary | ICD-10-CM | POA: Diagnosis not present

## 2017-07-08 DIAGNOSIS — Z8673 Personal history of transient ischemic attack (TIA), and cerebral infarction without residual deficits: Secondary | ICD-10-CM | POA: Insufficient documentation

## 2017-07-08 DIAGNOSIS — Z01812 Encounter for preprocedural laboratory examination: Secondary | ICD-10-CM | POA: Diagnosis present

## 2017-07-08 DIAGNOSIS — I10 Essential (primary) hypertension: Secondary | ICD-10-CM | POA: Insufficient documentation

## 2017-07-08 DIAGNOSIS — E785 Hyperlipidemia, unspecified: Secondary | ICD-10-CM | POA: Insufficient documentation

## 2017-07-08 DIAGNOSIS — R7303 Prediabetes: Secondary | ICD-10-CM | POA: Diagnosis not present

## 2017-07-08 LAB — BASIC METABOLIC PANEL
ANION GAP: 10 (ref 5–15)
BUN: 23 mg/dL — ABNORMAL HIGH (ref 6–20)
CO2: 29 mmol/L (ref 22–32)
Calcium: 10.2 mg/dL (ref 8.9–10.3)
Chloride: 99 mmol/L — ABNORMAL LOW (ref 101–111)
Creatinine, Ser: 1.54 mg/dL — ABNORMAL HIGH (ref 0.61–1.24)
GFR, EST AFRICAN AMERICAN: 51 mL/min — AB (ref >60)
GFR, EST NON AFRICAN AMERICAN: 44 mL/min — AB (ref >60)
GLUCOSE: 108 mg/dL — AB (ref 65–99)
POTASSIUM: 4.3 mmol/L (ref 3.5–5.1)
SODIUM: 138 mmol/L (ref 135–145)

## 2017-07-08 LAB — TYPE AND SCREEN
ABO/RH(D): A NEG
Antibody Screen: NEGATIVE

## 2017-07-08 LAB — CBC
HEMATOCRIT: 50.1 % (ref 40.0–52.0)
HEMOGLOBIN: 16.8 g/dL (ref 13.0–18.0)
MCH: 30.3 pg (ref 26.0–34.0)
MCHC: 33.6 g/dL (ref 32.0–36.0)
MCV: 90.2 fL (ref 80.0–100.0)
Platelets: 227 10*3/uL (ref 150–440)
RBC: 5.55 MIL/uL (ref 4.40–5.90)
RDW: 13.7 % (ref 11.5–14.5)
WBC: 5.8 10*3/uL (ref 3.8–10.6)

## 2017-07-08 LAB — SURGICAL PCR SCREEN
MRSA, PCR: NEGATIVE
Staphylococcus aureus: POSITIVE — AB

## 2017-07-08 LAB — URINALYSIS, ROUTINE W REFLEX MICROSCOPIC
Bilirubin Urine: NEGATIVE
GLUCOSE, UA: NEGATIVE mg/dL
HGB URINE DIPSTICK: NEGATIVE
KETONES UR: NEGATIVE mg/dL
LEUKOCYTES UA: NEGATIVE
Nitrite: NEGATIVE
PROTEIN: NEGATIVE mg/dL
Specific Gravity, Urine: 1.014 (ref 1.005–1.030)
pH: 5 (ref 5.0–8.0)

## 2017-07-08 LAB — PROTIME-INR
INR: 0.94
PROTHROMBIN TIME: 12.5 s (ref 11.4–15.2)

## 2017-07-08 LAB — APTT: APTT: 30 s (ref 24–36)

## 2017-07-08 NOTE — Patient Instructions (Signed)
Your procedure is scheduled on: Monday 07/20/17 Report to [place]. To find out your arrival time please call [phone] between 1PM - 3PM on Friday 07/17/17.  Remember: Instructions that are not followed completely may result in serious medical risk, up to and including death, or upon the discretion of your surgeon and anesthesiologist your surgery may need to be rescheduled.     _X__ 1. Do not eat food after midnight the night before your procedure.                 No gum chewing or hard candies. You may drink clear liquids up to 2 hours                 before you are scheduled to arrive for your surgery- DO not drink clear                 liquids within 2 hours of the start of your surgery.                 Clear Liquids include:  water, apple juice without pulp, clear carbohydrate                 drink such as Clearfast or Gatorade, Black Coffee or Tea (Do not add                 anything to coffee or tea).  __X__2.  On the morning of surgery brush your teeth with toothpaste and water, you                 may rinse your mouth with mouthwash if you wish.  Do not swallow any              toothpaste of mouthwash.     _X__ 3.  No Alcohol for 24 hours before or after surgery.   _X__ 4.  Do Not Smoke or use e-cigarettes For 24 Hours Prior to Your Surgery.                 Do not use any chewable tobacco products for at least 6 hours prior to                 surgery.  ____  5.  Bring all medications with you on the day of surgery if instructed.   __X__  6.  Notify your doctor if there is any change in your medical condition      (cold, fever, infections).     Do not wear jewelry, make-up, hairpins, clips or nail polish. Do not wear lotions, powders, or perfumes.  Do not shave 48 hours prior to surgery. Men may shave face and neck. Do not bring valuables to the hospital.    [facility] is not responsible for any belongings or  valuables.  Contacts, dentures/partials or body piercings may not be worn into surgery. Bring a case for your contacts, glasses or hearing aids, a denture cup will be supplied. Leave your suitcase in the car. After surgery it may be brought to your room. For patients admitted to the hospital, discharge time is determined by your treatment team.   Patients discharged the day of surgery will not be allowed to drive home.   Please read over the following fact sheets that you were given:   MRSA Information  __X__ Take these medicines the morning of surgery with A SIP OF WATER:  1. AMLODIPINE  2. ROWEEPRA  3. METOPROLOL  4.  5.  6.  ____ Fleet Enema (as directed)   __X__ Use CHG Soap/SAGE wipes as directed  ____ Use inhalers on the day of surgery  ____ Stop metformin/Janumet/Farxiga 2 days prior to surgery    ____ Take 1/2 of usual insulin dose the night before surgery. No insulin the morning          of surgery.   ____ Stop Blood Thinners Coumadin/Plavix/Xarelto/Pleta/Pradaxa/Eliquis/Effient/Aspirin  on   Or contact your Surgeon, Cardiologist or Medical Doctor regarding  ability to stop your blood thinners  __X__ Stop Anti-inflammatories 7 days before surgery such as Advil, Ibuprofen, Motrin,  BC or Goodies Powder, Naprosyn, Naproxen, Aleve, Aspirin    __X__ Stop all herbal supplements, fish oil or vitamin E until after surgery.    ____ Bring C-Pap to the hospital.

## 2017-07-08 NOTE — Pre-Procedure Instructions (Signed)
Patients pre-op clearance from neurology was declined at present time. Patients last f/u was 04/2016 (hx CVA/ AVM with bleed 11/2015) office discharged patient for declining appropriate f/u. Spoke to Dr Marcello Moores (anesthesia) about patient neuro history and requests patient follow up with neuro prior to surgery. Notified Deanne Coffer of [facility] and contacted patients daughter. She will call Dr Estanislado Pandy office to get appointment.

## 2017-07-08 NOTE — Pre-Procedure Instructions (Signed)
Patients meds in "Bubble Pack" orders received from Dr Andree Elk to give Norvasc, Keppra, Metoprolol to patient on arrival to  Day Surgery.

## 2017-07-10 NOTE — Pre-Procedure Instructions (Signed)
Positive staph aureus results sent to Dr. Marica Otter for review.  Asked if wanted any treatment?

## 2017-07-13 ENCOUNTER — Other Ambulatory Visit: Payer: Medicare HMO

## 2017-07-13 DIAGNOSIS — C61 Malignant neoplasm of prostate: Secondary | ICD-10-CM

## 2017-07-14 ENCOUNTER — Telehealth: Payer: Self-pay | Admitting: Neurology

## 2017-07-14 LAB — PSA: PROSTATE SPECIFIC AG, SERUM: 5.4 ng/mL — AB (ref 0.0–4.0)

## 2017-07-14 LAB — TESTOSTERONE: Testosterone: 8 ng/dL — ABNORMAL LOW (ref 264–916)

## 2017-07-14 NOTE — Telephone Encounter (Signed)
Sherry/RN [place] [phone] with premit testing, pt needs clearance for total knee replacement on 07/20/17. She said Dr Estanislado Pandy advised the pt needs neurology clearance. Can this pt see Janett Billow?? Please call to advise.

## 2017-07-15 NOTE — Telephone Encounter (Signed)
LEft vm for Teressa Lower at [facility] concerning clearance for patient. Pt was last seen 04/2016 and never follow up.

## 2017-07-15 NOTE — Telephone Encounter (Signed)
Pts daughter called stating that she is hoping the clearance for the pts surgery goes through in time . Stating she has already gotten the surgery schedule and taken off work. [place] stating the pt has trouble hearing so would like [place] to contact her with any information.

## 2017-07-15 NOTE — Telephone Encounter (Addendum)
Rn call Cherie at [facility]. Rn stated a urgent pre op was fax today for patient. Cherie stated the pre op intake was assess by Dr. Harlow Mares. Rn stated pt was last seen 04/2016, and never follow up. She stated if patient has to been seen before it can be approve the surgery will have to get postpone. IF its postpone just put it on the form. RN stated a message will be sent to Dr. Leonie Man.

## 2017-07-15 NOTE — Telephone Encounter (Signed)
Rn spoke with patients daughter that pt was last seen 04/2016.  Per Dr.Sethi if he needs clearance the pt needs to be evaluated by him. The form states pt needs neuro clearance. Appt made on 07/16/2017 at 1200pm check in time at 1130am. Daughter stated she will bring her father to the appt.

## 2017-07-16 ENCOUNTER — Ambulatory Visit (INDEPENDENT_AMBULATORY_CARE_PROVIDER_SITE_OTHER): Payer: Medicare HMO | Admitting: Neurology

## 2017-07-16 ENCOUNTER — Encounter: Payer: Self-pay | Admitting: Neurology

## 2017-07-16 VITALS — BP 121/66 | HR 57 | Wt 284.5 lb

## 2017-07-16 DIAGNOSIS — G40109 Localization-related (focal) (partial) symptomatic epilepsy and epileptic syndromes with simple partial seizures, not intractable, without status epilepticus: Secondary | ICD-10-CM | POA: Diagnosis not present

## 2017-07-16 NOTE — Progress Notes (Signed)
[facility] [address] [place]. [place] [address] [phone]       OFFICE FOLLOW-UP NOTE  Timothy Maddox Date of Birth:  1945-08-06 Medical Record Number:  [id number]   HPI: Initial visit  01/04/16 :Timothy Maddox is a 54 year Caucasian male seen today for first office follow-up visit following hospital admission for intracerebral hemorrhage in July 2017. He is accompanied by his daughter.The patient was recently admitted to the [facility] from 7/11-7/14/17. He presented initially to [facility] ED with numbness around his mouth and slurred speech. Imaging revealed a left frontal intracerebral hemorrhage and he was transferred to [facility] for further management. He underwent CT angiogram and MRI scan of the brain which did not indicate any underlying structural pathology that would explain his hemorrhage. Conventional four-vessel cerebral angiogram on 11/16/15 showed a pial AV fistula in the left perisylvian region that appear to be supplied by an anterior parietal branch off of the inferior division of the left middle cerebral artery associated with an early draining vein the drain into the sphenoparietal sinus and a large cortical vein in the frontal convexity. This was felt to be the source of his hemorrhage. He was discharged home with instructions to follow up for elective endovascular treatment in 1-2 months. He and his family report that he has had persistent slurred speech since his hemorrhage. On 12/19/15 he was at home when he experienced some abnormal motor activity involving his right arm. He reports that he had uncontrolled jerking in the right arm. This was witnessed by his family who also reported that he had some blinking of his eyes and some twitching of his mouth but no involvement of the right leg or the left side of his body. He was apparently responsive during this episode. The patient has good recollection of these events. He was brought to the  emergency department for evaluation. While in the ED, he had another episode that was described as right gaze deviation with possible generalized tonic-clonic seizure activity. He does not recall this event. He was given Ativan 1 mg IV and loaded with Keppra 1000 mg IV. He has no previous history of seizure activity. Patient was discharged home on Keppra 500 twice a day which he seems to tolerating well without any side effects. He was advised to see Dr. Estanislado Pandy for follow-up in 1-2 months to decide on elective AVM endovascular treatment but this has not yet happened. He states his speech is mostly improved and occasionally has some word finding difficulties particularly when tries to talk phosphorus tired. He states his blood pressure is well controlled and today it is 123/73. He has no neurological complaints today Update 04/08/2016 : He returns for follow-up after last visit 3 months ago. He is accompanied by his girlfriend. Patient has had no further seizures since he is on Keppra 500 twice daily which is starting well without side effects. He however states that he is having almost daily headaches. His headaches began when he had been hemorrhage but for the last 3-4 weeks he is having daily headaches. He describes this as being moderate to severe intensity located more in the left frontal and temporal regions. This is present most of the day. He has been taking atenolol couple of tablets a day which only partial relief. Denies any light sensitivity, nausea or vomiting. Patient has also been having some sinus problems with coughing as well. He feels this may be allergies acting up. He has not seen  his primary care physician for this yet. He did see Dr. Estanislado Pandy who did diagnostic cerebral catheter angiogram on 02/20/16 which showed improvement in the AVM but still persistent AVm vessels. The definitive treatment has been postponed and Dr. Estanislado Pandy patch to see him again in a few months. Update 07/15/2017 ;  he returns for follow-up after last visit more than a year ago. He is doing well and has not had any recurrent seizures. He is tolerating Keppra 500 twice daily without any side effects. He has not seen Dr. Estanislado Pandy for follow-up catheter angiogram for his AVM. Patient is planning to have elective knee replacement and is here for neurological clearance. He has no new complaints. Is no longer having headaches.   His blood pressure is well controlled. ROS:   14 system review of systems is positive for  No complaints except knee pain and all other systems negative PMH:  Past Medical History:  Diagnosis Date  . Arthritis    lower back, shoulders  . AVM (arteriovenous malformation) brain   . Cancer [facility])    prostate- treated with radiation  . CVA (cerebral infarction) 11/2015  . Dyspnea    with exertion  . HOH (hard of hearing)   . Hyperlipidemia   . Hypertension   . Pneumonia    1st grade  . Pre-diabetes   . Prediabetes   . Seizures ([place]) 12/2015  . Stroke [facility])    speech impairment, no weakness    Social History:  Social History   Socioeconomic History  . Marital status: Single    Spouse name: Not on file  . Number of children: Not on file  . Years of education: Not on file  . Highest education level: Not on file  Social Needs  . Financial resource strain: Not on file  . Food insecurity - worry: Not on file  . Food insecurity - inability: Not on file  . Transportation needs - medical: Not on file  . Transportation needs - non-medical: Not on file  Occupational History  . Occupation: retired Curator  Tobacco Use  . Smoking status: Former Smoker    Years: 2.00  . Smokeless tobacco: Never Used  . Tobacco comment: quit age 50  Substance and Sexual Activity  . Alcohol use: No  . Drug use: No  . Sexual activity: Not on file  Other Topics Concern  . Not on file  Social History Narrative  . Not on file    Medications:   Current Outpatient Medications on File Prior to  Visit  Medication Sig Dispense Refill  . acetaminophen (TYLENOL) 500 MG tablet Take 500 mg by mouth every 6 (six) hours as needed.    Marland Kitchen amLODipine (NORVASC) 10 MG tablet Take 10 mg by mouth daily.    . calcium carbonate (OSCAL) 1500 (600 Ca) MG TABS tablet Take 600 mg of elemental calcium by mouth 2 (two) times daily with a meal.    . cholecalciferol (VITAMIN D) 1000 units tablet Take 1,000 Units by mouth daily.    Marland Kitchen levETIRAcetam (ROWEEPRA) 500 MG tablet Take 500 mg by mouth 2 (two) times daily.    Marland Kitchen losartan-hydrochlorothiazide (HYZAAR) 100-25 MG tablet Take 1 tablet by mouth daily.  1  . metoprolol (LOPRESSOR) 100 MG tablet Take 100 mg by mouth 2 (two) times daily.     Marland Kitchen spironolactone (ALDACTONE) 100 MG tablet Take 100 mg by mouth 2 (two) times daily.    Marland Kitchen topiramate (TOPAMAX) 50 MG tablet Take 1 tablet (  50 mg total) by mouth 2 (two) times daily. Start one tablet daily x 1 week then twice daily 180 tablet 0   No current facility-administered medications on file prior to visit.     Allergies:   Allergies  Allergen Reactions  . Poison Oak Extract Rash  . Poison Ivy Extract Rash    Physical Exam General: obese middle-age Caucasian male, seated, in no evident distress Head: head normocephalic and atraumatic.  Neck: supple with no carotid or supraclavicular bruits Cardiovascular: regular rate and rhythm, no murmurs Musculoskeletal: no deformity Skin:  no rash/petichiae Vascular:  Normal pulses all extremities Vitals:   07/16/17 1129  BP: 121/66  Pulse: (!) 57   Neurologic Exam Mental Status: Awake and fully alert. Oriented to place and time. Recent and remote memory intact. Attention span, concentration and fund of knowledge appropriate. Mood and affect appropriate. Speech is fluent without hesitancy. Good comprehension.   Cranial Nerves: Fundoscopic exam reveals sharp disc margins. Pupils equal, briskly reactive to light. Extraocular movements full without nystagmus. Visual  fields full to confrontation. Hearing diminished on the right side. Facial sensation intact. Face, tongue, palate moves normally and symmetrically.  Motor: Normal bulk and tone. Normal strength in all tested extremity muscles. Sensory.: intact to touch ,pinprick .position and vibratory sensation.  Coordination: Rapid alternating movements normal in all extremities. Finger-to-nose and heel-to-shin performed accurately bilaterally. Gait and Station: Arises from chair without difficulty. Stance is normal. Gait demonstrates normal stride length and balance . Able to heel, toe and tandem walk without difficulty.  Reflexes: 1+ and symmetric. Toes downgoing.       ASSESSMENT: 67 year Caucasian male with left frontal parenchymal intracerebral hemorrhage due to his small AVM in July 2017with symptomatic simple partial seizures in August 2017. 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 I recommend he stay on Keppra 500 mg twice daily which is tolerating well without side effects. I advised him to avoid seizure provoking stimuli like sleep deprivation, medication noncompliance and alcohol use. I also encouraged him to see Dr. Estanislado Pandy to discuss endovascular treatment for his AVM. Marland Kitchen I advised him to see his primary care physician for his  keppra refillsif he/she is willing He is planning on having knee surgery next week and is neurologically cleared but I have advised him to limit using narcotic pain medications which may lower the seizure threshold. Return for follow-up to see me in future only if necessary . Greater than 50% of time during this 25 minute visit was spent on counseling,explanation of diagnosis, planning of further management, discussion with patient and family and  coordination of care Antony Contras, MD Medical Director [place] Pager: [phone] 07/16/2017 12:00 PM Note: This document was prepared with digital dictation and possible smart phrase technology. Any transcriptional errors that result from this process are unintentional

## 2017-07-16 NOTE — Patient Instructions (Signed)
I had a long discussion with the patient and his girl friend regarding his remote intracerebral hemorrhage from a small AV malformation as well as seizures. I recommend he stay on Keppra 500 mg twice daily which is tolerating well without side effects. I advised him to avoid seizure provoking stimuli like sleep deprivation, medication noncompliance and alcohol use.He is planning on having knee surgery next week and is neurologically cleared but I have advised him to limit using narcotic pain medications which may lower the seizure threshold. He was advised to follow-up with Dr. Estanislado Pandy  for AVM follow-up. Return for follow-up with me in the future only as necessary

## 2017-07-16 NOTE — Telephone Encounter (Signed)
Sherry/[facility] [phone] called she wants the clearance letter faxed to her for their records. Please fax to [phone]. Thank you

## 2017-07-16 NOTE — Telephone Encounter (Addendum)
Rn call to speak with Timothy Maddox she was gone for the day. The staff stated they wanted the clearance form fax to [phone]. Rn stated the form was fax to [phone]. They stated the clearance form was receive from our office this afternoon. Rn stated pt was also given a copy.Note closed.

## 2017-07-16 NOTE — Telephone Encounter (Signed)
Revised. 

## 2017-07-16 NOTE — Pre-Procedure Instructions (Signed)
NEURO CLEARANCE TO CONTINUE KEPPRE AND MINIMIZE NARCOTIC USE

## 2017-07-16 NOTE — Anesthesia Pain Management Evaluation Note (Signed)
CLEARANCE BY NEURO SEE NOTE. CONTINUE KEPPRA FOR SEIZURES AND MINIMIZE NARCOTIC USE.

## 2017-07-16 NOTE — Telephone Encounter (Signed)
Clearance form was confirmed per telephone call it was receive by Cherie at fax [phone].

## 2017-07-16 NOTE — Telephone Encounter (Signed)
Rn call Timothy Maddox at [facility] pre op. Rn stated pt was seen today for a clearance appt. Rn ask for fax number. Cherie gave fax number of [phone]. Clearance form fax twice and confirmed via fax.

## 2017-07-17 ENCOUNTER — Ambulatory Visit (INDEPENDENT_AMBULATORY_CARE_PROVIDER_SITE_OTHER): Payer: Medicare HMO | Admitting: Urology

## 2017-07-17 ENCOUNTER — Encounter: Payer: Self-pay | Admitting: Urology

## 2017-07-17 VITALS — BP 133/79 | HR 64 | Ht 73.0 in | Wt 287.0 lb

## 2017-07-17 DIAGNOSIS — C61 Malignant neoplasm of prostate: Secondary | ICD-10-CM

## 2017-07-17 MED ORDER — LEUPROLIDE ACETATE (3 MONTH) 22.5 MG IM KIT
22.5000 mg | PACK | Freq: Once | INTRAMUSCULAR | Status: AC
  Administered 2017-07-17: 22.5 mg via INTRAMUSCULAR

## 2017-07-17 NOTE — Progress Notes (Signed)
07/17/2017 1:42 PM   Samul Dada 1945/05/07 [id number]  Referring provider: [name], MD [place], [place] [id number]  Chief Complaint  Patient presents with  . Follow-up    HPI: The patient is a 72 year old gentleman who presents for follow-up of his prostate cancer.  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 Marian Sorrow at [facility] and then by Dr. Christiane Ha in [facility] May 2018. I reviewed the notes. 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 MRI was negative for metastasis but showed T3/ECE on the right side, bone scan was normal. He started Lupron October 2013 and completed IMRT February 2014 with 18 months ADT. Last GU note from May 2018 mentions a PSA rise and to consider staging. Pt thinks it was about 10.Repeat PSA in August 2018 was 9.3. CT/bone scan negative for metastatic disease. Testosterone was 53 in August 2018.  He was started on a 6-month Lupron shot at that time.  His PSA fell to 4.9 in December 2018.  His testosterone fell to 14.  He received another 57-month shot of Lupron at that time.  In March 2019, his PSA slightly rose to 5.4.  His testosterone was 8.  He has minimal LUTS. No new pain.   Voiding well without complaints.  Patient denies any changes in his fatigue.  He does have fatigue at baseline even prior to starting testosterone therapy.  He has ED and uses isosorbide. Not a candidate for PDE 5 meds. His VED works.  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 He has HTN.    PMH: Past Medical History:  Diagnosis Date  . Arthritis    lower back, shoulders  . AVM (arteriovenous malformation) brain   . Cancer [facility])    prostate- treated with radiation  . CVA (cerebral infarction) 11/2015  . Dyspnea    with exertion  . HOH (hard of hearing)   . Hyperlipidemia   . Hypertension   . Pneumonia    1st grade  .  Pre-diabetes   . Prediabetes   . Seizures ([place]) 12/2015  . Stroke [facility])    speech impairment, no weakness    Surgical History: Past Surgical History:  Procedure Laterality Date  . BACK SURGERY  1999   disectomy  . BACK SURGERY     2 surgery- injury  . COLONOSCOPY W/ POLYPECTOMY    . HAMMER TOE SURGERY Bilateral   . INGUINAL HERNIA REPAIR Right    per patient when he was in 1st grade  . IR GENERIC HISTORICAL  12/05/2015   IR RADIOLOGIST EVAL & MGMT 12/05/2015 [facility]  . IR GENERIC HISTORICAL  01/31/2016   IR RADIOLOGIST EVAL & MGMT 01/31/2016 [facility]  . IR GENERIC HISTORICAL  02/20/2016   IR ANGIO INTRA EXTRACRAN SEL COM CAROTID INNOMINATE UNI L MOD SED 02/20/2016 [name], MD [facility]  . IR GENERIC HISTORICAL  02/20/2016   IR 3D INDEPENDENT WKST 02/20/2016 [name], MD [facility]  . KNEE SURGERY Bilateral 2015  . RADIOLOGY WITH ANESTHESIA N/A 02/20/2016   Procedure: EMBOLIZATION;  Surgeon: [name], MD;  Location: [place];  Service: Radiology;  Laterality: N/A;  . TOE SURGERY Left 2017   TOE NAIL REMOVAL    Home Medications:  Allergies as of 07/17/2017      Reactions   Poison  Oak Extract Rash   Poison Ivy Extract Rash      Medication List        Accurate as of 07/17/17  1:42 PM. Always use your most recent med list.          acetaminophen 500 MG tablet Commonly known as:  TYLENOL Take 500 mg by mouth every 6 (six) hours as needed.   amLODipine 10 MG tablet Commonly known as:  NORVASC Take 10 mg by mouth daily.   calcium carbonate 1500 (600 Ca) MG Tabs tablet Commonly known as:  OSCAL Take 600 mg of elemental calcium by mouth 2 (two) times daily with a meal.   cholecalciferol 1000 units tablet Commonly known as:  VITAMIN D Take 1,000 Units by mouth daily.   losartan-hydrochlorothiazide 100-25 MG tablet Commonly known as:  HYZAAR Take 1 tablet by mouth daily.   metoprolol tartrate 100 MG tablet Commonly known as:   LOPRESSOR Take 100 mg by mouth 2 (two) times daily.   ROWEEPRA 500 MG tablet Generic drug:  levETIRAcetam Take 500 mg by mouth 2 (two) times daily.   spironolactone 100 MG tablet Commonly known as:  ALDACTONE Take 100 mg by mouth 2 (two) times daily.   topiramate 50 MG tablet Commonly known as:  TOPAMAX Take 1 tablet (50 mg total) by mouth 2 (two) times daily. Start one tablet daily x 1 week then twice daily       Allergies:  Allergies  Allergen Reactions  . Poison Oak Extract Rash  . Poison Ivy Extract Rash    Family History: Family History  Problem Relation Age of Onset  . Heart failure Father   . Diabetes Father   . Hypertension Father   . Alzheimer's disease Father 28  . Other Mother 70       homicide    Social History:  reports that he has quit smoking. He quit after 2.00 years of use. he has never used smokeless tobacco. He reports that he does not drink alcohol or use drugs.  ROS: UROLOGY Frequent Urination?: No Hard to postpone urination?: No Burning/pain with urination?: No Get up at night to urinate?: Yes Leakage of urine?: No Urine stream starts and stops?: No Trouble starting stream?: No Do you have to strain to urinate?: No Blood in urine?: No Urinary tract infection?: No Sexually transmitted disease?: No Injury to kidneys or bladder?: No Painful intercourse?: No Weak stream?: No Erection problems?: No Penile pain?: No  Gastrointestinal Nausea?: No Vomiting?: No Indigestion/heartburn?: No Diarrhea?: No Constipation?: No  Constitutional Fever: No Night sweats?: No Weight loss?: No Fatigue?: No  Skin Skin rash/lesions?: No Itching?: No  Eyes Blurred vision?: Yes Double vision?: No  Ears/Nose/Throat Sore throat?: No Sinus problems?: No  Hematologic/Lymphatic Swollen glands?: No Easy bruising?: No  Cardiovascular Leg swelling?: No Chest pain?: No  Respiratory Cough?: No Shortness of breath?:  No  Endocrine Excessive thirst?: No  Musculoskeletal Back pain?: Yes Joint pain?: Yes  Neurological Headaches?: No Dizziness?: No  Psychologic Depression?: No Anxiety?: No  Physical Exam: BP 133/79   Pulse 64   Ht 6\' 1"  (1.854 m)   Wt 287 lb (130.2 kg)   BMI 37.87 kg/m   Constitutional:  Alert and oriented, No acute distress. HEENT: [place] AT, moist mucus membranes.  Trachea midline, no masses. Cardiovascular: No clubbing, cyanosis, or edema. Respiratory: Normal respiratory effort, no increased work of breathing. GI: Abdomen is soft, nontender, nondistended, no abdominal masses GU: No CVA tenderness.  Skin: No  rashes, bruises or suspicious lesions. Lymph: No cervical or inguinal adenopathy. Neurologic: Grossly intact, no focal deficits, moving all 4 extremities. Psychiatric: Normal mood and affect.  Laboratory Data: Lab Results  Component Value Date   WBC 5.8 07/08/2017   HGB 16.8 07/08/2017   HCT 50.1 07/08/2017   MCV 90.2 07/08/2017   PLT 227 07/08/2017    Lab Results  Component Value Date   CREATININE 1.54 (H) 07/08/2017    No results found for: PSA  Lab Results  Component Value Date   TESTOSTERONE 8 (L) 07/13/2017    Lab Results  Component Value Date   HGBA1C 6.3 (H) 02/15/2016    Urinalysis    Component Value Date/Time   COLORURINE YELLOW (A) 07/08/2017 1338   APPEARANCEUR CLEAR (A) 07/08/2017 1338   LABSPEC 1.014 07/08/2017 1338   PHURINE 5.0 07/08/2017 1338   GLUCOSEU NEGATIVE 07/08/2017 1338   HGBUR NEGATIVE 07/08/2017 1338   [place] NEGATIVE 07/08/2017 1338   [place] NEGATIVE 07/08/2017 1338   PROTEINUR NEGATIVE 07/08/2017 1338   NITRITE NEGATIVE 07/08/2017 1338   LEUKOCYTESUR NEGATIVE 07/08/2017 1338     Assessment & Plan:    1.  Biochemical recurrence of prostate cancer  I showed the patient my concern that his PSA appears to be starting to rise after only starting androgen deprivation therapy 6 months ago.  This is not  entirely surprising as his testosterone was only 53 prior to instituting this therapy.  We did discuss repeating his 30-month Lupron shot today which she received.  We will have him follow-up in 3 months with testosterone and PSA prior.  I suspect at that time his PSA will likely rise again and if so we will consider instituting Xtandi at that time.  Return in about 3 months (around 10/17/2017) for PSA/T prior.  Nickie Retort, MD  [facility] [address], [place], [place] [address] [phone]

## 2017-07-17 NOTE — Progress Notes (Signed)
Lupron IM Injection   Due to Prostate Cancer patient is present today for a Lupron Injection.  Medication: Lupron 3 month Dose:22.5 mg  Location: right upper outer buttocks Lot: [id number] Exp: 12/10/2019  Patient tolerated well, no complications were noted  Performed by: Elberta Leatherwood, CMA  Follow up: 3 month

## 2017-07-17 NOTE — Addendum Note (Signed)
Addended by: Kyra Manges on: 07/17/2017 02:03 PM   Modules accepted: Orders

## 2017-07-19 MED ORDER — TRANEXAMIC ACID 1000 MG/10ML IV SOLN
1000.0000 mg | INTRAVENOUS | Status: DC
  Filled 2017-07-19: qty 10

## 2017-07-19 MED ORDER — CEFAZOLIN SODIUM-DEXTROSE 2-4 GM/100ML-% IV SOLN
2.0000 g | INTRAVENOUS | Status: AC
  Administered 2017-07-20: 2 g via INTRAVENOUS

## 2017-07-20 ENCOUNTER — Inpatient Hospital Stay: Payer: Medicare HMO | Admitting: Certified Registered Nurse Anesthetist

## 2017-07-20 ENCOUNTER — Encounter: Payer: Self-pay | Admitting: Certified Registered Nurse Anesthetist

## 2017-07-20 ENCOUNTER — Inpatient Hospital Stay: Payer: Medicare HMO

## 2017-07-20 ENCOUNTER — Inpatient Hospital Stay
Admission: RE | Admit: 2017-07-20 | Discharge: 2017-07-22 | DRG: 470 | Disposition: A | Discharge status: Home / Self Care | Payer: Medicare HMO | Source: Ambulatory Visit | Attending: Orthopedic Surgery | Admitting: Orthopedic Surgery

## 2017-07-20 ENCOUNTER — Other Ambulatory Visit: Payer: Self-pay

## 2017-07-20 ENCOUNTER — Encounter
Admission: RE | Disposition: A | Discharge status: Home / Self Care | Payer: Self-pay | Source: Ambulatory Visit | Attending: Orthopedic Surgery

## 2017-07-20 DIAGNOSIS — Z8673 Personal history of transient ischemic attack (TIA), and cerebral infarction without residual deficits: Secondary | ICD-10-CM

## 2017-07-20 DIAGNOSIS — M1711 Unilateral primary osteoarthritis, right knee: Principal | ICD-10-CM | POA: Diagnosis present

## 2017-07-20 DIAGNOSIS — Z87891 Personal history of nicotine dependence: Secondary | ICD-10-CM | POA: Diagnosis not present

## 2017-07-20 DIAGNOSIS — Z923 Personal history of irradiation: Secondary | ICD-10-CM

## 2017-07-20 DIAGNOSIS — H9190 Unspecified hearing loss, unspecified ear: Secondary | ICD-10-CM | POA: Diagnosis present

## 2017-07-20 DIAGNOSIS — R7303 Prediabetes: Secondary | ICD-10-CM | POA: Diagnosis present

## 2017-07-20 DIAGNOSIS — E785 Hyperlipidemia, unspecified: Secondary | ICD-10-CM | POA: Diagnosis present

## 2017-07-20 DIAGNOSIS — Z09 Encounter for follow-up examination after completed treatment for conditions other than malignant neoplasm: Secondary | ICD-10-CM

## 2017-07-20 DIAGNOSIS — I10 Essential (primary) hypertension: Secondary | ICD-10-CM | POA: Diagnosis present

## 2017-07-20 DIAGNOSIS — Z8546 Personal history of malignant neoplasm of prostate: Secondary | ICD-10-CM | POA: Diagnosis not present

## 2017-07-20 DIAGNOSIS — R001 Bradycardia, unspecified: Secondary | ICD-10-CM

## 2017-07-20 DIAGNOSIS — R079 Chest pain, unspecified: Secondary | ICD-10-CM

## 2017-07-20 HISTORY — PX: TOTAL KNEE ARTHROPLASTY: SHX125

## 2017-07-20 LAB — GLUCOSE, CAPILLARY
GLUCOSE-CAPILLARY: 155 mg/dL — AB (ref 65–99)
GLUCOSE-CAPILLARY: 181 mg/dL — AB (ref 65–99)
Glucose-Capillary: 156 mg/dL — ABNORMAL HIGH (ref 65–99)

## 2017-07-20 LAB — ABO/RH: ABO/RH(D): A NEG

## 2017-07-20 SURGERY — ARTHROPLASTY, KNEE, TOTAL
Anesthesia: Spinal | Laterality: Right

## 2017-07-20 MED ORDER — PROMETHAZINE HCL 25 MG/ML IJ SOLN: 6.2500 mg | INTRAMUSCULAR | Status: DC | PRN

## 2017-07-20 MED ORDER — SENNOSIDES-DOCUSATE SODIUM 8.6-50 MG PO TABS
1.0000 | ORAL_TABLET | Freq: Every evening | ORAL | Status: DC | PRN
  Administered 2017-07-22: 1 via ORAL
  Filled 2017-07-20: qty 1

## 2017-07-20 MED ORDER — GLYCOPYRROLATE 0.2 MG/ML IJ SOLN
0.2000 mg | Freq: Once | INTRAMUSCULAR | Status: AC
  Administered 2017-07-20: 0.2 mg via INTRAVENOUS

## 2017-07-20 MED ORDER — LACTATED RINGERS IV SOLN
INTRAVENOUS | Status: DC
  Administered 2017-07-20: 12:00:00 via INTRAVENOUS

## 2017-07-20 MED ORDER — EPINEPHRINE PF 1 MG/ML IJ SOLN
INTRAMUSCULAR | Status: AC
  Filled 2017-07-20: qty 1

## 2017-07-20 MED ORDER — METOCLOPRAMIDE HCL 5 MG/ML IJ SOLN: 5.0000 mg | Freq: Three times a day (TID) | INTRAMUSCULAR | Status: DC | PRN

## 2017-07-20 MED ORDER — LOSARTAN POTASSIUM 50 MG PO TABS
100.0000 mg | ORAL_TABLET | Freq: Every day | ORAL | Status: DC
  Administered 2017-07-20: 100 mg via ORAL
  Filled 2017-07-20: qty 2

## 2017-07-20 MED ORDER — SUCCINYLCHOLINE CHLORIDE 20 MG/ML IJ SOLN
INTRAMUSCULAR | Status: AC
  Filled 2017-07-20: qty 1

## 2017-07-20 MED ORDER — ONDANSETRON HCL 4 MG PO TABS: 4.0000 mg | ORAL_TABLET | Freq: Four times a day (QID) | ORAL | Status: DC | PRN

## 2017-07-20 MED ORDER — BISACODYL 10 MG RE SUPP: 10.0000 mg | Freq: Every day | RECTAL | Status: DC | PRN

## 2017-07-20 MED ORDER — METOPROLOL TARTRATE 50 MG PO TABS
100.0000 mg | ORAL_TABLET | Freq: Once | ORAL | Status: AC
  Administered 2017-07-20: 100 mg via ORAL

## 2017-07-20 MED ORDER — ROPIVACAINE HCL 5 MG/ML IJ SOLN
INTRAMUSCULAR | Status: DC | PRN
  Administered 2017-07-20: 25 mL via PERINEURAL

## 2017-07-20 MED ORDER — CALCIUM CARBONATE ANTACID 500 MG PO CHEW
600.0000 mg | CHEWABLE_TABLET | Freq: Two times a day (BID) | ORAL | Status: DC
  Administered 2017-07-20 – 2017-07-22 (×5): 600 mg via ORAL
  Filled 2017-07-20 (×5): qty 3

## 2017-07-20 MED ORDER — CEFAZOLIN SODIUM-DEXTROSE 2-4 GM/100ML-% IV SOLN
INTRAVENOUS | Status: AC
  Filled 2017-07-20: qty 100

## 2017-07-20 MED ORDER — METOPROLOL TARTRATE 50 MG PO TABS
100.0000 mg | ORAL_TABLET | Freq: Two times a day (BID) | ORAL | Status: DC
  Administered 2017-07-20 – 2017-07-21 (×2): 100 mg via ORAL
  Filled 2017-07-20 (×2): qty 2

## 2017-07-20 MED ORDER — OXYCODONE HCL 5 MG PO TABS
5.0000 mg | ORAL_TABLET | ORAL | Status: DC | PRN
  Administered 2017-07-20 – 2017-07-22 (×4): 5 mg via ORAL
  Administered 2017-07-22: 10 mg via ORAL
  Filled 2017-07-20 (×3): qty 1
  Filled 2017-07-20 (×2): qty 2

## 2017-07-20 MED ORDER — ALUM & MAG HYDROXIDE-SIMETH 200-200-20 MG/5ML PO SUSP: 30.0000 mL | ORAL | Status: DC | PRN

## 2017-07-20 MED ORDER — GABAPENTIN 300 MG PO CAPS
300.0000 mg | ORAL_CAPSULE | Freq: Three times a day (TID) | ORAL | Status: DC
  Administered 2017-07-20 – 2017-07-22 (×7): 300 mg via ORAL
  Filled 2017-07-20 (×7): qty 1

## 2017-07-20 MED ORDER — LOSARTAN POTASSIUM-HCTZ 100-25 MG PO TABS: 1.0000 | ORAL_TABLET | Freq: Every day | ORAL | Status: DC

## 2017-07-20 MED ORDER — SODIUM CHLORIDE 0.9 % IV SOLN
INTRAVENOUS | Status: DC | PRN
  Administered 2017-07-20: 1000 mg via INTRAVENOUS

## 2017-07-20 MED ORDER — LEVETIRACETAM 500 MG PO TABS
500.0000 mg | ORAL_TABLET | Freq: Once | ORAL | Status: AC
  Administered 2017-07-20: 500 mg via ORAL
  Filled 2017-07-20: qty 1

## 2017-07-20 MED ORDER — BUPIVACAINE-EPINEPHRINE (PF) 0.5% -1:200000 IJ SOLN
INTRAMUSCULAR | Status: DC | PRN
  Administered 2017-07-20: 30 mL via PERINEURAL

## 2017-07-20 MED ORDER — LEVETIRACETAM 500 MG PO TABS
500.0000 mg | ORAL_TABLET | Freq: Two times a day (BID) | ORAL | Status: DC
  Administered 2017-07-20 – 2017-07-22 (×4): 500 mg via ORAL
  Filled 2017-07-20 (×6): qty 1

## 2017-07-20 MED ORDER — ACETAMINOPHEN 500 MG PO TABS
1000.0000 mg | ORAL_TABLET | Freq: Once | ORAL | Status: AC
  Administered 2017-07-20: 1000 mg via ORAL

## 2017-07-20 MED ORDER — TOPIRAMATE 25 MG PO TABS
50.0000 mg | ORAL_TABLET | Freq: Two times a day (BID) | ORAL | Status: DC
  Administered 2017-07-20 – 2017-07-22 (×5): 50 mg via ORAL
  Filled 2017-07-20 (×6): qty 2

## 2017-07-20 MED ORDER — SODIUM CHLORIDE 0.9 % IJ SOLN
INTRAMUSCULAR | Status: AC
  Filled 2017-07-20: qty 50

## 2017-07-20 MED ORDER — METOCLOPRAMIDE HCL 10 MG PO TABS: 5.0000 mg | ORAL_TABLET | Freq: Three times a day (TID) | ORAL | Status: DC | PRN

## 2017-07-20 MED ORDER — AMLODIPINE BESYLATE 10 MG PO TABS
10.0000 mg | ORAL_TABLET | Freq: Every day | ORAL | Status: DC
  Administered 2017-07-20: 10 mg via ORAL
  Filled 2017-07-20: qty 1

## 2017-07-20 MED ORDER — FAMOTIDINE 20 MG PO TABS
ORAL_TABLET | ORAL | Status: AC
  Filled 2017-07-20: qty 1

## 2017-07-20 MED ORDER — PHENYLEPHRINE HCL 10 MG/ML IJ SOLN
INTRAMUSCULAR | Status: AC
  Filled 2017-07-20: qty 1

## 2017-07-20 MED ORDER — EPHEDRINE SULFATE 50 MG/ML IJ SOLN
INTRAMUSCULAR | Status: DC | PRN
  Administered 2017-07-20 (×3): 10 mg via INTRAVENOUS

## 2017-07-20 MED ORDER — GABAPENTIN 300 MG PO CAPS
300.0000 mg | ORAL_CAPSULE | Freq: Once | ORAL | Status: AC
  Administered 2017-07-20: 300 mg via ORAL

## 2017-07-20 MED ORDER — MIDAZOLAM HCL 5 MG/5ML IJ SOLN
INTRAMUSCULAR | Status: DC | PRN
  Administered 2017-07-20 (×2): 1 mg via INTRAVENOUS

## 2017-07-20 MED ORDER — BUPIVACAINE LIPOSOME 1.3 % IJ SUSP
INTRAMUSCULAR | Status: AC
  Filled 2017-07-20: qty 20

## 2017-07-20 MED ORDER — DEXAMETHASONE SODIUM PHOSPHATE 10 MG/ML IJ SOLN
INTRAMUSCULAR | Status: DC | PRN
  Administered 2017-07-20: 8 mg

## 2017-07-20 MED ORDER — HYDROMORPHONE HCL 1 MG/ML IJ SOLN: 0.2500 mg | INTRAMUSCULAR | Status: DC | PRN

## 2017-07-20 MED ORDER — LACTATED RINGERS IV SOLN
INTRAVENOUS | Status: DC
  Administered 2017-07-20: 07:00:00 via INTRAVENOUS

## 2017-07-20 MED ORDER — ONDANSETRON HCL 4 MG/2ML IJ SOLN: 4.0000 mg | Freq: Four times a day (QID) | INTRAMUSCULAR | Status: DC | PRN

## 2017-07-20 MED ORDER — PHENOL 1.4 % MT LIQD
1.0000 | OROMUCOSAL | Status: DC | PRN
  Filled 2017-07-20: qty 177

## 2017-07-20 MED ORDER — FENTANYL CITRATE (PF) 100 MCG/2ML IJ SOLN
INTRAMUSCULAR | Status: DC | PRN
  Administered 2017-07-20: 25 ug via INTRAVENOUS

## 2017-07-20 MED ORDER — ACETAMINOPHEN 500 MG PO TABS
ORAL_TABLET | ORAL | Status: AC
  Administered 2017-07-20: 1000 mg via ORAL
  Filled 2017-07-20: qty 2

## 2017-07-20 MED ORDER — MENTHOL 3 MG MT LOZG
1.0000 | LOZENGE | OROMUCOSAL | Status: DC | PRN
  Filled 2017-07-20: qty 9

## 2017-07-20 MED ORDER — GLYCOPYRROLATE 0.2 MG/ML IJ SOLN
INTRAMUSCULAR | Status: AC
Start: 2017-07-20 — End: 2017-07-20
  Administered 2017-07-20: 0.2 mg via INTRAVENOUS
  Filled 2017-07-20: qty 1

## 2017-07-20 MED ORDER — BUPIVACAINE HCL (PF) 0.5 % IJ SOLN
INTRAMUSCULAR | Status: AC
  Filled 2017-07-20: qty 10

## 2017-07-20 MED ORDER — CEFAZOLIN SODIUM-DEXTROSE 2-4 GM/100ML-% IV SOLN
2.0000 g | Freq: Four times a day (QID) | INTRAVENOUS | Status: AC
  Administered 2017-07-20 (×2): 2 g via INTRAVENOUS
  Filled 2017-07-20 (×2): qty 100

## 2017-07-20 MED ORDER — LIDOCAINE HCL (PF) 2 % IJ SOLN
INTRAMUSCULAR | Status: AC
  Filled 2017-07-20: qty 10

## 2017-07-20 MED ORDER — DOCUSATE SODIUM 100 MG PO CAPS
100.0000 mg | ORAL_CAPSULE | Freq: Two times a day (BID) | ORAL | Status: DC
  Administered 2017-07-20 – 2017-07-22 (×5): 100 mg via ORAL
  Filled 2017-07-20 (×5): qty 1

## 2017-07-20 MED ORDER — TRAMADOL HCL 50 MG PO TABS
50.0000 mg | ORAL_TABLET | Freq: Four times a day (QID) | ORAL | Status: DC
  Administered 2017-07-20 – 2017-07-22 (×7): 50 mg via ORAL
  Filled 2017-07-20 (×7): qty 1

## 2017-07-20 MED ORDER — LIDOCAINE HCL (PF) 1 % IJ SOLN
INTRAMUSCULAR | Status: AC
  Filled 2017-07-20: qty 5

## 2017-07-20 MED ORDER — FENTANYL CITRATE (PF) 100 MCG/2ML IJ SOLN
INTRAMUSCULAR | Status: AC
  Filled 2017-07-20: qty 2

## 2017-07-20 MED ORDER — SODIUM CHLORIDE 0.9 % IR SOLN
Status: DC | PRN
  Administered 2017-07-20 (×2)

## 2017-07-20 MED ORDER — ASPIRIN EC 325 MG PO TBEC
325.0000 mg | DELAYED_RELEASE_TABLET | Freq: Every day | ORAL | Status: DC
  Administered 2017-07-21 – 2017-07-22 (×2): 325 mg via ORAL
  Filled 2017-07-20 (×2): qty 1

## 2017-07-20 MED ORDER — OXYCODONE HCL 5 MG PO TABS: 10.0000 mg | ORAL_TABLET | ORAL | Status: DC | PRN

## 2017-07-20 MED ORDER — GABAPENTIN 300 MG PO CAPS
ORAL_CAPSULE | ORAL | Status: AC
  Administered 2017-07-20: 300 mg via ORAL
  Filled 2017-07-20: qty 1

## 2017-07-20 MED ORDER — MEPERIDINE HCL 50 MG/ML IJ SOLN: 6.2500 mg | INTRAMUSCULAR | Status: DC | PRN

## 2017-07-20 MED ORDER — FAMOTIDINE 20 MG PO TABS
20.0000 mg | ORAL_TABLET | Freq: Once | ORAL | Status: AC
  Administered 2017-07-20: 20 mg via ORAL

## 2017-07-20 MED ORDER — LIDOCAINE HCL (CARDIAC) 20 MG/ML IV SOLN
INTRAVENOUS | Status: DC | PRN
  Administered 2017-07-20: 80 mg via INTRAVENOUS

## 2017-07-20 MED ORDER — AMLODIPINE BESYLATE 10 MG PO TABS
10.0000 mg | ORAL_TABLET | Freq: Once | ORAL | Status: AC
  Administered 2017-07-20: 10 mg via ORAL
  Filled 2017-07-20: qty 1

## 2017-07-20 MED ORDER — EPHEDRINE SULFATE 50 MG/ML IJ SOLN
INTRAMUSCULAR | Status: AC
  Filled 2017-07-20: qty 1

## 2017-07-20 MED ORDER — PNEUMOCOCCAL VAC POLYVALENT 25 MCG/0.5ML IJ INJ: 0.5000 mL | INJECTION | INTRAMUSCULAR | Status: DC

## 2017-07-20 MED ORDER — DEXAMETHASONE SODIUM PHOSPHATE 10 MG/ML IJ SOLN
INTRAMUSCULAR | Status: AC
  Filled 2017-07-20: qty 1

## 2017-07-20 MED ORDER — LACTATED RINGERS IV SOLN: INTRAVENOUS | Status: DC

## 2017-07-20 MED ORDER — PROPOFOL 500 MG/50ML IV EMUL
INTRAVENOUS | Status: AC
  Filled 2017-07-20: qty 50

## 2017-07-20 MED ORDER — BUPIVACAINE HCL (PF) 0.5 % IJ SOLN
INTRAMUSCULAR | Status: DC | PRN
  Administered 2017-07-20: 3 mL

## 2017-07-20 MED ORDER — BUPIVACAINE-EPINEPHRINE (PF) 0.5% -1:200000 IJ SOLN
INTRAMUSCULAR | Status: AC
  Filled 2017-07-20: qty 30

## 2017-07-20 MED ORDER — MIDAZOLAM HCL 2 MG/2ML IJ SOLN
INTRAMUSCULAR | Status: AC
  Filled 2017-07-20: qty 2

## 2017-07-20 MED ORDER — MAGNESIUM CITRATE PO SOLN
1.0000 | Freq: Once | ORAL | Status: AC | PRN
  Administered 2017-07-22: 1 via ORAL
  Filled 2017-07-20 (×2): qty 296

## 2017-07-20 MED ORDER — BACITRACIN 50000 UNITS IM SOLR
INTRAMUSCULAR | Status: AC
  Filled 2017-07-20: qty 1

## 2017-07-20 MED ORDER — METOPROLOL TARTRATE 50 MG PO TABS
ORAL_TABLET | ORAL | Status: AC
  Administered 2017-07-20: 100 mg via ORAL
  Filled 2017-07-20: qty 2

## 2017-07-20 MED ORDER — ACETAMINOPHEN 325 MG PO TABS
325.0000 mg | ORAL_TABLET | Freq: Four times a day (QID) | ORAL | Status: DC | PRN
  Administered 2017-07-22: 650 mg via ORAL
  Filled 2017-07-20: qty 2

## 2017-07-20 MED ORDER — INSULIN ASPART 100 UNIT/ML ~~LOC~~ SOLN
0.0000 [IU] | Freq: Three times a day (TID) | SUBCUTANEOUS | Status: DC
  Administered 2017-07-20: 3 [IU] via SUBCUTANEOUS
  Administered 2017-07-21 (×2): 2 [IU] via SUBCUTANEOUS
  Administered 2017-07-21: 3 [IU] via SUBCUTANEOUS
  Administered 2017-07-22: 2 [IU] via SUBCUTANEOUS
  Filled 2017-07-20 (×6): qty 1

## 2017-07-20 MED ORDER — ROPIVACAINE HCL 5 MG/ML IJ SOLN
INTRAMUSCULAR | Status: AC
  Filled 2017-07-20: qty 30

## 2017-07-20 MED ORDER — HYDROMORPHONE HCL 1 MG/ML IJ SOLN: 0.5000 mg | INTRAMUSCULAR | Status: DC | PRN

## 2017-07-20 MED ORDER — PROPOFOL 10 MG/ML IV BOLUS
INTRAVENOUS | Status: DC | PRN
  Administered 2017-07-20: 20 mg via INTRAVENOUS

## 2017-07-20 MED ORDER — VITAMIN D 1000 UNITS PO TABS
1000.0000 [IU] | ORAL_TABLET | Freq: Every day | ORAL | Status: DC
  Administered 2017-07-20 – 2017-07-22 (×3): 1000 [IU] via ORAL
  Filled 2017-07-20 (×3): qty 1

## 2017-07-20 MED ORDER — ONDANSETRON HCL 4 MG/2ML IJ SOLN
INTRAMUSCULAR | Status: AC
Start: 2017-07-20 — End: 2017-07-20
  Filled 2017-07-20: qty 2

## 2017-07-20 MED ORDER — SPIRONOLACTONE 100 MG PO TABS
100.0000 mg | ORAL_TABLET | Freq: Two times a day (BID) | ORAL | Status: DC
  Administered 2017-07-20 – 2017-07-21 (×3): 100 mg via ORAL
  Filled 2017-07-20 (×2): qty 1
  Filled 2017-07-20: qty 4
  Filled 2017-07-20 (×3): qty 1
  Filled 2017-07-20: qty 4
  Filled 2017-07-20: qty 1

## 2017-07-20 MED ORDER — SODIUM CHLORIDE 0.9 % IV SOLN
INTRAVENOUS | Status: DC | PRN
  Administered 2017-07-20: 100 mL

## 2017-07-20 MED ORDER — ACETAMINOPHEN 500 MG PO TABS
1000.0000 mg | ORAL_TABLET | Freq: Four times a day (QID) | ORAL | Status: AC
  Administered 2017-07-20 – 2017-07-21 (×3): 1000 mg via ORAL
  Filled 2017-07-20 (×3): qty 2

## 2017-07-20 MED ORDER — PROPOFOL 500 MG/50ML IV EMUL
INTRAVENOUS | Status: DC | PRN
  Administered 2017-07-20: 15 ug/kg/min via INTRAVENOUS

## 2017-07-20 MED ORDER — CHLORHEXIDINE GLUCONATE 4 % EX LIQD: 60.0000 mL | Freq: Once | CUTANEOUS | Status: DC

## 2017-07-20 MED ORDER — INSULIN ASPART 100 UNIT/ML ~~LOC~~ SOLN: 0.0000 [IU] | Freq: Every day | SUBCUTANEOUS | Status: DC

## 2017-07-20 MED ORDER — HYDROCHLOROTHIAZIDE 25 MG PO TABS
25.0000 mg | ORAL_TABLET | Freq: Every day | ORAL | Status: DC
  Administered 2017-07-20: 25 mg via ORAL
  Filled 2017-07-20: qty 1

## 2017-07-20 MED ORDER — INSULIN ASPART 100 UNIT/ML ~~LOC~~ SOLN
4.0000 [IU] | Freq: Three times a day (TID) | SUBCUTANEOUS | Status: DC
  Filled 2017-07-20: qty 1

## 2017-07-20 MED ORDER — KETOROLAC TROMETHAMINE 15 MG/ML IJ SOLN
15.0000 mg | Freq: Four times a day (QID) | INTRAMUSCULAR | Status: AC
  Administered 2017-07-20 – 2017-07-21 (×4): 15 mg via INTRAVENOUS
  Filled 2017-07-20 (×4): qty 1

## 2017-07-20 SURGICAL SUPPLY — 53 items
BLADE SAW 1 (BLADE) ×3 IMPLANT
BLADE SAW 1/2 (BLADE) ×3 IMPLANT
BOWL CEMENT MIX W/ADAPTER (MISCELLANEOUS) ×3 IMPLANT
BRUSH SCRUB EZ  4% CHG (MISCELLANEOUS) ×2
BRUSH SCRUB EZ 4% CHG (MISCELLANEOUS) ×1 IMPLANT
CANISTER SUCT 1200ML W/VALVE (MISCELLANEOUS) ×3 IMPLANT
CANISTER SUCT 3000ML PPV (MISCELLANEOUS) ×3 IMPLANT
CAP KNEE TOTAL 3 SIGMA ×3 IMPLANT
CEMENT BONE 1-PACK (Cement) ×6 IMPLANT
CHLORAPREP W/TINT 26ML (MISCELLANEOUS) ×3 IMPLANT
COOLER POLAR GLACIER W/PUMP (MISCELLANEOUS) ×3 IMPLANT
CUFF TOURN 30 STER DUAL PORT (MISCELLANEOUS) ×3 IMPLANT
DRAPE INCISE IOBAN 66X60 STRL (DRAPES) ×3 IMPLANT
DRAPE SHEET LG 3/4 BI-LAMINATE (DRAPES) ×3 IMPLANT
DRSG AQUACEL AG ADV 3.5X14 (GAUZE/BANDAGES/DRESSINGS) ×6 IMPLANT
ELECT REM PT RETURN 9FT ADLT (ELECTROSURGICAL) ×3
ELECTRODE REM PT RTRN 9FT ADLT (ELECTROSURGICAL) ×1 IMPLANT
GAUZE PETRO XEROFOAM 1X8 (MISCELLANEOUS) ×3 IMPLANT
GLOVE BIO SURGEON STRL SZ7.5 (GLOVE) ×6 IMPLANT
GLOVE INDICATOR 8.0 STRL GRN (GLOVE) ×3 IMPLANT
GLOVE SURG ORTHO 8.0 STRL STRW (GLOVE) ×9 IMPLANT
GLOVE SURG SYN 6.5 ES PF (GLOVE) ×3 IMPLANT
GOWN STRL REUS W/ TWL LRG LVL3 (GOWN DISPOSABLE) ×1 IMPLANT
GOWN STRL REUS W/ TWL XL LVL3 (GOWN DISPOSABLE) ×1 IMPLANT
GOWN STRL REUS W/TWL LRG LVL3 (GOWN DISPOSABLE) ×2
GOWN STRL REUS W/TWL XL LVL3 (GOWN DISPOSABLE) ×2
HOOD PEEL AWAY FLYTE STAYCOOL (MISCELLANEOUS) ×9 IMPLANT
IV NS 1000ML (IV SOLUTION) ×2
IV NS 1000ML BAXH (IV SOLUTION) ×1 IMPLANT
KIT TURNOVER KIT A (KITS) ×3 IMPLANT
MAT ABSORB  FLUID 56X50 GRAY (MISCELLANEOUS) ×2
MAT ABSORB FLUID 56X50 GRAY (MISCELLANEOUS) ×1 IMPLANT
NDL SAFETY ECLIPSE 18X1.5 (NEEDLE) ×1 IMPLANT
NEEDLE HYPO 18GX1.5 SHARP (NEEDLE) ×2
NEEDLE SPNL 20GX3.5 QUINCKE YW (NEEDLE) ×3 IMPLANT
NS IRRIG 1000ML POUR BTL (IV SOLUTION) ×3 IMPLANT
PACK TOTAL KNEE (MISCELLANEOUS) ×3 IMPLANT
PAD DE MAYO PRESSURE PROTECT (MISCELLANEOUS) ×3 IMPLANT
PAD WRAPON POLAR KNEE (MISCELLANEOUS) ×1 IMPLANT
PULSAVAC PLUS IRRIG FAN TIP (DISPOSABLE) ×3
SPONGE LAP 18X18 5 PK (GAUZE/BANDAGES/DRESSINGS) ×3 IMPLANT
STAPLER SKIN PROX 35W (STAPLE) ×3 IMPLANT
SUCTION FRAZIER HANDLE 10FR (MISCELLANEOUS) ×2
SUCTION TUBE FRAZIER 10FR DISP (MISCELLANEOUS) ×1 IMPLANT
SUT DVC 2 QUILL PDO  T11 36X36 (SUTURE) ×2
SUT DVC 2 QUILL PDO T11 36X36 (SUTURE) ×1 IMPLANT
SUT VIC AB 2-0 CT1 18 (SUTURE) ×3 IMPLANT
SUT VIC AB 2-0 CT1 27 (SUTURE) ×2
SUT VIC AB 2-0 CT1 TAPERPNT 27 (SUTURE) ×1 IMPLANT
SUT VIC AB PLUS 45CM 1-MO-4 (SUTURE) ×3 IMPLANT
SYR 30ML LL (SYRINGE) ×9 IMPLANT
TIP FAN IRRIG PULSAVAC PLUS (DISPOSABLE) ×1 IMPLANT
WRAPON POLAR PAD KNEE (MISCELLANEOUS) ×3

## 2017-07-20 NOTE — Anesthesia Procedure Notes (Signed)
Spinal  Patient location during procedure: OR Start time: 07/20/2017 7:24 AM End time: 07/20/2017 7:31 AM Staffing Resident/CRNA: Dawayne Cirri I, CRNA Preanesthetic Checklist Completed: patient identified, site marked, surgical consent, pre-op evaluation, timeout performed, IV checked, risks and benefits discussed and monitors and equipment checked Spinal Block Patient position: sitting Prep: ChloraPrep and site prepped and draped Patient monitoring: heart rate, continuous pulse ox and blood pressure Approach: right paramedian Location: L3-4 Injection technique: single-shot Needle Needle type: Pencan  Needle gauge: 24 G Needle length: 10 cm Needle insertion depth: 9 cm Assessment Sensory level: T8 Additional Notes No heme or paresthesia.

## 2017-07-20 NOTE — Progress Notes (Signed)
Inpatient Diabetes Program Recommendations  AACE/ADA: New Consensus Statement on Inpatient Glycemic Control (2015)  Target Ranges:  Prepandial:   less than 140 mg/dL      Peak postprandial:   less than 180 mg/dL (1-2 hours)      Critically ill patients:  140 - 180 mg/dL   Lab Results  Component Value Date   GLUCAP 156 (H) 07/20/2017   HGBA1C 6.3 (H) 02/15/2016    Diabetes history: None noted- Last A1c in 2017 indicated possible pre-diabetes Outpatient Diabetes medications: None Current orders for Inpatient glycemic control:  Novolog moderate tid with meals and HS, Novolog 4 units tid with meals  Inpatient Diabetes Program Recommendations:   Note patient did received Decadron intraoperatively which could increase blood sugars.  May consider rechecking A1C to determine pre-hospitalization glycemic control.  Agree with current orders.  Patient may not need Novolog meal coverage 4 units?  Will follow.  Thanks,  Adah Perl, RN, BC-ADM Inpatient Diabetes Coordinator Pager [phone] (8a-5p)

## 2017-07-20 NOTE — Transfer of Care (Signed)
Immediate Anesthesia Transfer of Care Note  Patient: Timothy Maddox  Procedure(s) Performed: TOTAL KNEE ARTHROPLASTY (Right )  Patient Location: PACU  Anesthesia Type:General  Level of Consciousness: awake  Airway & Oxygen Therapy: Patient Spontanous Breathing  Post-op Assessment: Report given to RN  Post vital signs: stable  Last Vitals:  Vitals:   07/20/17 0616  BP: 139/74  Pulse: (!) 59  Resp: 17  Temp: (!) 36.4 C  SpO2: 98%    Last Pain:  Vitals:   07/20/17 0616  TempSrc: Tympanic         Complications: No apparent anesthesia complications

## 2017-07-20 NOTE — Op Note (Signed)
DATE OF SURGERY:  07/20/2017 TIME: 9:50 AM  PATIENT NAME:  Timothy Maddox   AGE: 72 y.o.    PRE-OPERATIVE DIAGNOSIS:  M17.11 Unilateral primary osteoarthritis, right knee  POST-OPERATIVE DIAGNOSIS:  Same  PROCEDURE:  Procedure(s): TOTAL KNEE ARTHROPLASTY  SURGEON:  Lovell Sheehan, MD   ASSISTANT:  Wyatt Portela, PA-C  OPERATIVE IMPLANTS: Depuy Sigma, Cruciate Retaining Femoral component size  4, Sigma Fixed Bearing Tray size 4, Patella polyethylene 3-peg oval button size 38 mm, with a 10 mm polyethylene Curve-Plus insert.   PREOPERATIVE INDICATIONS:  [name] is an 72 y.o. male who has a diagnosis of right knee osteoarthritis and elected for a right total knee arthroplasty after failing nonoperative treatment, including activity modification, pain medication, physical therapy and injections who has significant impairment of their activities of daily living.  Radiographs have demonstrated tricompartmental osteoarthritis joint space narrowing, osteophytes, subchondral sclerosis and cyst formation.  The risks, benefits, and alternatives were discussed at length including but not limited to the risks of infection, bleeding, nerve or blood vessel injury, knee stiffness, fracture, dislocation, loosening or failure of the hardware and the need for further surgery. Medical risks include but not limited to DVT and pulmonary embolism, myocardial infarction, stroke, pneumonia, respiratory failure and death. I discussed these risks with the patient in my office prior to the date of surgery. They understood these risks and were willing to proceed.  OPERATIVE FINDINGS AND UNIQUE ASPECTS OF THE CASE:  All three compartments with advanced and severe degenerative changes, large osteophytes and an abundance of synovial fluid. Significant deformity was also noted. A decision was made to proceed with total knee arthroplasty.   OPERATIVE DESCRIPTION:  The patient was brought to the operative room and  placed in a supine position after undergoing placement of a general anesthetic. IV antibiotics were given. Patient received tranexamic acid. The lower extremity was prepped and draped in the usual sterile fashion.  A time out was performed to verify the patient's name, date of birth, medical record number, correct site of surgery and correct procedure to be performed. The timeout was also used to confirm the patient received antibiotics and that appropriate instruments, implants and radiographs studies were available in the room.  The leg was elevated and exsanguinated with an Esmarch and the tourniquet was inflated to 275 mmHg.  A midline incision was made over the left knee.. A medial parapatellar arthrotomy was then made and the patella subluxed laterally and the knee was brought into 90 of flexion. Hoffa's fat pad along with the anterior cruciate ligament was resected and the medial joint line was exposed.  Attention was then turned to preparation of the patella. The thickness of the patella was measured with a caliper, the diameter measured with the patella templates.  The patella resection was then made with an oscillating saw using the patella cutting guide.  The 38 mm button fit appropriately.  3 peg holes for the patella component were then drilled.  The extramedullary tibial cutting guide was then placed using the anterior tibial crest and second ray of the foot as a reference.  The tibial cutting guide was adjusted to allow for appropriate posterior slope.  The tibial cutting block was pinned into position. The slotted stylus was used to measure the proximal tibial resection of 2 mm off the low medial side. Care was taken during the tibial resection to protect the medial and collateral ligaments.  The resected tibial bone was removed.  The distal femur  was resected using the TruMatch cutting guide.  Care was taken to protect the collateral ligaments during distal femoral resection.  The distal  femoral resection was performed with an oscillating saw. The femoral cutting guide was then removed. Extension gap was measured with a 10 mm spacer block and alignment and extension was confirmed using a long alignment rod. The femur was sized to be a 4. Rotation of the referencing guide was checked with the epicondylar axis and Whitesides line. Then the 4-in-1 cutting jig was then applied to the distal femur. A stylus was used to confirm that the anterior femur would not be notched.   Then the anterior, posterior and chamfer femoral cuts were then made with an oscillating saw.  All posterior osteophytes were removed.  The flexion gap was then measured with a flexion spacer block and long alignment rod and was found to be symmetric with the extension gap and perpendicular to mechanical axis of the tibia.  The proximal tibia plateau was then sized with trial trays. The best coverage was achieved with a size 4. This tibial tray was then pinned into position. The proximal tibia was then prepared with the reamer and keel punch.  After tibial preparation was completed, all trial components were inserted with polyethylene trials.  The knee was found to have excellent balance and full motion with a size 10 mm tibial polyethylene insert..    The trials were then placed. Knee was taken through a full range of motion and deemed to be stable with the trial components. All trial components were then removed.  The joint was copiously irrigated with pulse lavage.  The final total knee arthroplasty components were then cemented into place. The knee was held in extension while cement was allowed to cure.The knee was taken through a range of motion and the patella tracked well and the knee was again irrigated copiously.  The knee capsule was then injected with Exparel.  The medial arthrotomy was closed with #1 Vicryl and #2 Quill. The subcutaneous tissue closed with  2-0 vicryl, and skin approximated with staples.  A dry  sterile and compressive dressing was applied.  A Polar Care was applied to the operative knee.  The patient was awakened and brought to the PACU in stable and satisfactory condition.  All sharp, lap and instrument counts were correct at the conclusion the case. I spoke with the patient's family in the postop consultation room to let them know the case had been performed without complication and the patient was stable in recovery room.   Total tourniquet time was 58 minutes.

## 2017-07-20 NOTE — Progress Notes (Signed)
Patient and family had questions about insulin. Reviewed insulin orders and discussed with patient. Patient and family think the scheduled 4 units on top of the sliding scale is way too much insulin for someone that doesn't take insulin at home.    Notified MD and informed him and their concerns. MD stated that he would like the patient's BS below 150 and would follow the recommendations of the diabetic coordinator. Informed patient and family of discussion and they are agreeable to the sliding scale insulin coverage.

## 2017-07-20 NOTE — Progress Notes (Signed)
Physical Therapy Evaluation Patient Details Name: Timothy Maddox MRN: [id number] DOB: [birth year] Today's Date: 07/20/2017   History of Present Illness  Pt underwent R TKR. PT evaluation performed POD#0. PMH includes seizures, CVA, OA, and HTN  Clinical Impression  Pt admitted with above diagnosis. Pt currently with functional limitations due to the deficits listed below (see PT Problem List). He is modified independent for bed mobility and requires CGA only for transfers. Pt able to take short, shuffling steps from bed to recliner with UE support on bariatric rolling walker. Cues provided for forward and retro ambulation. Pt demonstrates good weight acceptance to RLE. AAROM -12 to 85 degrees. Pt should be appropriate for OP PT after discharge. Pt will benefit from PT services to address deficits in strength, balance, and mobility in order to return to full function at home.     Follow Up Recommendations Outpatient PT    Equipment Recommendations  3in1 (PT);Other (comment)(Pt may be able to borrow [facility])    Recommendations for Other Services OT consult     Precautions / Restrictions Precautions Precautions: Knee Precaution Booklet Issued: Yes (comment) Restrictions Weight Bearing Restrictions: Yes RLE Weight Bearing: Weight bearing as tolerated      Mobility  Bed Mobility Overal bed mobility: Modified Independent             General bed mobility comments: Increased time, HOB elevated, use of bed rails  Transfers Overall transfer level: Needs assistance Equipment used: Rolling walker (2 wheeled) Transfers: Sit to/from Stand Sit to Stand: Min guard         General transfer comment: cues for safe hand placement. Increased time. Pt is steady in standing with UE support on rolling walker  Ambulation/Gait Ambulation/Gait assistance: Min guard Ambulation Distance (Feet): 5 Feet Assistive device: Rolling walker (2 wheeled)   Gait velocity: Decreased Gait velocity  interpretation: <1.8 ft/sec, indicative of risk for recurrent falls General Gait Details: Pt able to take short, shuffling steps from bed to recliner with UE support on bariatric rolling walker. Cues provided for forward and retro ambulation. Pt demonstrates good weight acceptance to RLE  Stairs            Wheelchair Mobility    Modified Rankin (Stroke Patients Only)       Balance Overall balance assessment: Needs assistance Sitting-balance support: No upper extremity supported Sitting balance-Leahy Scale: Good     Standing balance support: Bilateral upper extremity supported Standing balance-Leahy Scale: Fair                               Pertinent Vitals/Pain Pain Assessment: 0-10 Pain Score: 3  Pain Location: R knee Pain Descriptors / Indicators: Aching Pain Intervention(s): Monitored during session;Premedicated before session    Home Living Family/patient expects to be discharged to:: Private residence Living Arrangements: Children Available Help at Discharge: Friend(s) Type of Home: Apartment Home Access: Elevator     Home Layout: One level Home Equipment: Environmental consultant - 2 wheels;Shower seat - built in Additional Comments: Will stay with daughter at discharge who has a single level house with 2 steps to enter and no rails. Standard commodes    Prior Function Level of Independence: Independent         Comments: Independent with ADLs/IADLs. Drives. Full community ambulation without assistive device     Hand Dominance   Dominant Hand: Right    Extremity/Trunk Assessment   Upper Extremity Assessment Upper Extremity Assessment: Overall  WFL for tasks assessed    Lower Extremity Assessment Lower Extremity Assessment: RLE deficits/detail RLE Deficits / Details: Able to perform SLR with 2 finger assist and SAQ without assist. Full RLE PF/DF       Communication   Communication: No difficulties  Cognition Arousal/Alertness:  Awake/alert Behavior During Therapy: WFL for tasks assessed/performed Overall Cognitive Status: Within Functional Limits for tasks assessed                                        General Comments      Exercises Total Joint Exercises Ankle Circles/Pumps: Both;10 reps Quad Sets: Both;10 reps Gluteal Sets: Both;10 reps Towel Squeeze: Both;10 reps Short Arc Quad: Right;10 reps Heel Slides: Right;10 reps Hip ABduction/ADduction: Right;10 reps Straight Leg Raises: Right;10 reps Goniometric ROM: -12 to 85 degrees AAROM, pain limited   Assessment/Plan    PT Assessment Patient needs continued PT services  PT Problem List Decreased strength;Decreased range of motion;Decreased activity tolerance;Decreased balance;Decreased mobility       PT Treatment Interventions DME instruction;Gait training;Stair training;Therapeutic activities;Therapeutic exercise;Balance training;Neuromuscular re-education;Patient/family education;Manual techniques    PT Goals (Current goals can be found in the Care Plan section)  Acute Rehab PT Goals Patient Stated Goal: Return to prior level of function PT Goal Formulation: With patient/family Time For Goal Achievement: 08/03/17 Potential to Achieve Goals: Good    Frequency BID   Barriers to discharge        Co-evaluation               AM-PAC PT "6 Clicks" Daily Activity  Outcome Measure Difficulty turning over in bed (including adjusting bedclothes, sheets and blankets)?: A Little Difficulty moving from lying on back to sitting on the side of the bed? : A Little Difficulty sitting down on and standing up from a chair with arms (e.g., wheelchair, bedside commode, etc,.)?: A Little Help needed moving to and from a bed to chair (including a wheelchair)?: A Little Help needed walking in hospital room?: A Little Help needed climbing 3-5 steps with a railing? : A Lot 6 Click Score: 17    End of Session Equipment Utilized During  Treatment: Gait belt Activity Tolerance: Patient tolerated treatment well Patient left: in chair;with call bell/phone within reach;with chair alarm set;with family/visitor present;with SCD's reapplied;Other (comment)(towel roll under heel, polar care in place)   PT Visit Diagnosis: Muscle weakness (generalized) (M62.81);Difficulty in walking, not elsewhere classified (R26.2);Pain Pain - Right/Left: Right Pain - part of body: Knee    Time: 1440-1510 PT Time Calculation (min) (ACUTE ONLY): 30 min   Charges:   PT Evaluation $PT Eval Low Complexity: 1 Low PT Treatments $Therapeutic Exercise: 8-22 mins   PT G Codes:        Lyndel Safe Hailyn Zarr PT, DPT    Debbi Strandberg 07/20/2017, 4:10 PM

## 2017-07-20 NOTE — Progress Notes (Signed)
Dr. Lavone Neri admin right adductor nerve block.  At end of procedure, pt c/o chest pain and "feeling funny."  Pt HR dropped to mid 30s.  Dr. Vonzella Nipple 0.2 robinul.  Pt HR increased to 70s.  Pt lying in bed and states "I feel better."  Dr. And nurse at bedside.  Will continue to monitor.

## 2017-07-20 NOTE — Anesthesia Post-op Follow-up Note (Signed)
Anesthesia QCDR form completed.        

## 2017-07-20 NOTE — H&P (Signed)
The patient has been re-examined, and the chart reviewed, and there have been no interval changes to the documented history and physical.  Plan a right total knee today.  Anesthesia is consulted regarding a peripheral nerve block for post-operative pain.  The risks, benefits, and alternatives have been discussed at length, and the patient is willing to proceed.     

## 2017-07-20 NOTE — Anesthesia Preprocedure Evaluation (Signed)
Anesthesia Evaluation  Patient identified by MRN, date of birth, ID band Patient awake    Reviewed: Allergy & Precautions, H&P , NPO status , reviewed documented beta blocker date and time   Airway Mallampati: IV  TM Distance: >3 FB     Dental  (+) Upper Dentures, Lower Dentures   Pulmonary shortness of breath and with exertion, pneumonia, resolved, former smoker,    Pulmonary exam normal        Cardiovascular hypertension, Normal cardiovascular exam Rate:Bradycardia  Study Conclusions  - Left ventricle: The cavity size was normal. There was severe   focal basal and mild concentric hypertrophy of the left   ventricle. Systolic function was vigorous. The estimated ejection   fraction was in the range of 65% to 70%. Wall motion was normal;   there were no regional wall motion abnormalities. Doppler   parameters are consistent with abnormal left ventricular   relaxation (grade 1 diastolic dysfunction). There was no evidence   of elevated ventricular filling pressure by Doppler parameters. - Aortic valve: Trileaflet; normal thickness leaflets. There was no   regurgitation. - Aortic root: The aortic root was normal in size. - Ascending aorta: The ascending aorta was normal in size. - Mitral valve: Calcified annulus. Mildly thickened leaflets . - Left atrium: The atrium was mildly dilated. - Right ventricle: Systolic function was normal. - Right atrium: The atrium was normal in size. - Tricuspid valve: There was no regurgitation. - Pulmonary arteries: Systolic pressure was within the normal   range. - Inferior vena cava: The vessel was normal in size. - Pericardium, extracardiac: There was no pericardial effusion.   Neuro/Psych Seizures -,  CVA, No Residual Symptoms    GI/Hepatic neg GERD  ,  Endo/Other    Renal/GU      Musculoskeletal  (+) Arthritis , Osteoarthritis,    Abdominal   Peds  Hematology   Anesthesia  Other Findings   Reproductive/Obstetrics                             Anesthesia Physical Anesthesia Plan  ASA: III  Anesthesia Plan: Spinal   Post-op Pain Management:  Regional for Post-op pain   Induction:   PONV Risk Score and Plan: 2 and Propofol infusion  Airway Management Planned:   Additional Equipment:   Intra-op Plan:   Post-operative Plan:   Informed Consent: I have reviewed the patients History and Physical, chart, labs and discussed the procedure including the risks, benefits and alternatives for the proposed anesthesia with the patient or authorized representative who has indicated his/her understanding and acceptance.   Dental Advisory Given  Plan Discussed with: CRNA  Anesthesia Plan Comments:         Anesthesia Quick Evaluation

## 2017-07-20 NOTE — Anesthesia Postprocedure Evaluation (Signed)
Anesthesia Post Note  Patient: Samul Dada  Procedure(s) Performed: TOTAL KNEE ARTHROPLASTY (Right )  Patient location during evaluation: PACU Anesthesia Type: Spinal Level of consciousness: oriented and awake and alert Pain management: pain level controlled Vital Signs Assessment: post-procedure vital signs reviewed and stable Respiratory status: spontaneous breathing, respiratory function stable and nonlabored ventilation Cardiovascular status: blood pressure returned to baseline and stable Postop Assessment: no headache, no backache and no apparent nausea or vomiting Anesthetic complications: no Comments: Adductor canal block performed in PACU (see note). Pt experienced vagal episode at end of block procedure. Recovered fully and stated felt fine and continued to be stable, was discharged without incident.     Last Vitals:  Vitals:   07/20/17 1149 07/20/17 1249  BP: 138/84 120/82  Pulse: 64 67  Resp: 16 16  Temp: (!) 36.2 C 36.5 C  SpO2: 95% 96%    Last Pain:  Vitals:   07/20/17 1337  TempSrc:   PainSc: 7                  Antar Milks T Lavone Neri

## 2017-07-20 NOTE — Progress Notes (Signed)
Pt is A&OX4. VSS. In no acute distress. RN assisted the pt from chair to bed with walker, tolerated well. Pedal pulses 2+ bilaterally. Has full sensation to both lower extremities and can wiggle toes bilaterally. Surgical dressing intact. Tolerating diet well. Will continue to monitor.

## 2017-07-20 NOTE — Anesthesia Procedure Notes (Signed)
Anesthesia Regional Block: Adductor canal block   Pre-Anesthetic Checklist: ,, timeout performed, Correct Patient, Correct Site, Correct Laterality, Correct Procedure, Correct Position, site marked, Risks and benefits discussed,  Surgical consent,  Pre-op evaluation,  At surgeon's request and post-op pain management  Laterality: Right  Prep: chloraprep       Needles:   Needle Type: Echogenic Stimulator Needle     Needle Length: 10cm  Needle Gauge: 21   Needle insertion depth: 9 cm   Additional Needles:   Procedures: Doppler guided,,,, ultrasound used (permanent image in chart),,,,  Narrative:  Start time: 07/20/2017 10:13 AM End time: 07/20/2017 10:24 AM Injection made incrementally with aspirations every 5 mL.  Events: blood aspirated,,,,,,,,,,  Performed by: Personally   Additional Notes: Pt noted to have bradycardia following procedure, "feel funny" - O2 Spr[place] placed, given robinul 0.2 mg, 12-lead checked, appears no sig change from prior. Will await final reading. Pt recovered quickly "feel fine" after few minutes.  Block: 0.5% ropivicaine w/ 1:200 epi/8mg  decadron - 25 ml total

## 2017-07-21 LAB — CBC
HEMATOCRIT: 41.9 % (ref 40.0–52.0)
Hemoglobin: 14.3 g/dL (ref 13.0–18.0)
MCH: 30.3 pg (ref 26.0–34.0)
MCHC: 34 g/dL (ref 32.0–36.0)
MCV: 89.1 fL (ref 80.0–100.0)
PLATELETS: 199 10*3/uL (ref 150–440)
RBC: 4.7 MIL/uL (ref 4.40–5.90)
RDW: 13.9 % (ref 11.5–14.5)
WBC: 11.4 10*3/uL — AB (ref 3.8–10.6)

## 2017-07-21 LAB — BASIC METABOLIC PANEL
Anion gap: 11 (ref 5–15)
BUN: 30 mg/dL — AB (ref 6–20)
CALCIUM: 8.8 mg/dL — AB (ref 8.9–10.3)
CO2: 23 mmol/L (ref 22–32)
CREATININE: 1.6 mg/dL — AB (ref 0.61–1.24)
Chloride: 101 mmol/L (ref 101–111)
GFR calc Af Amer: 48 mL/min — ABNORMAL LOW (ref >60)
GFR, EST NON AFRICAN AMERICAN: 42 mL/min — AB (ref >60)
Glucose, Bld: 160 mg/dL — ABNORMAL HIGH (ref 65–99)
Potassium: 4 mmol/L (ref 3.5–5.1)
Sodium: 135 mmol/L (ref 135–145)

## 2017-07-21 LAB — GLUCOSE, CAPILLARY
GLUCOSE-CAPILLARY: 129 mg/dL — AB (ref 65–99)
Glucose-Capillary: 153 mg/dL — ABNORMAL HIGH (ref 65–99)
Glucose-Capillary: 131 mg/dL — ABNORMAL HIGH (ref 65–99)
Glucose-Capillary: 137 mg/dL — ABNORMAL HIGH (ref 65–99)

## 2017-07-21 NOTE — NC FL2 (Signed)
[place] LEVEL OF CARE SCREENING TOOL     IDENTIFICATION  Patient Name: Timothy Maddox Birthdate: 17-Jan-1946 Sex: male Admission Date (Current Location): 07/20/2017  [facility] and [place] Number:  Selena Lesser ([id number]) Facility and Address:  [facility], [address], [place], [place] [address]      Provider Number: [id number]  Attending Physician Name and Address:  Lovell Sheehan, MD  Relative Name and Phone Number:       Current Level of Care: Hospital Recommended Level of Care: [place] Prior Approval Number:    Date Approved/Denied:   PASRR Number: [id number])  Discharge Plan: SNF    Current Diagnoses: Patient Active Problem List   Diagnosis Date Noted  . Osteoarthritis of right knee 07/20/2017  . Seizure ([place]) 12/19/2015  . 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 Seizures ([place]) 12/19/2015  . AVF (arteriovenous fistula) ([place]) 11/16/2015  . ICH (intracerebral hemorrhage) ([place]) 11/16/2015  . Stroke ([place]) 11/13/2015    Orientation RESPIRATION BLADDER Height & Weight     Self, Time, Situation, Place  Normal Continent Weight: 287 lb (130.2 kg) Height:  6\' 1"  (185.4 cm)  BEHAVIORAL SYMPTOMS/MOOD NEUROLOGICAL BOWEL NUTRITION STATUS      Continent Diet(Carb Modified)  AMBULATORY STATUS COMMUNICATION OF NEEDS Skin   Extensive Assist Verbally Surgical wounds(Incision Right Knee)                       Personal Care Assistance Level of Assistance  Bathing, Feeding, Dressing Bathing Assistance: Limited assistance Feeding assistance: Independent Dressing Assistance: Limited assistance     Functional Limitations Info  Sight, Hearing, Speech Sight Info: Adequate Hearing Info: Adequate Speech Info: Adequate    SPECIAL CARE FACTORS FREQUENCY  PT (By licensed PT), OT (By licensed OT)     PT Frequency: (5) OT Frequency: (5)            Contractures      Additional Factors Info   Code Status, Allergies Code Status Info: (Full Code) Allergies Info: (POISON OAK EXTRACT, POISON IVY EXTRACT )           Current Medications (07/21/2017):  This is the current hospital active medication list Current Facility-Administered Medications  Medication Dose Route Frequency Provider Last Rate Last Dose  . acetaminophen (TYLENOL) tablet 1,000 mg  1,000 mg Oral Q6H Lovell Sheehan, MD   1,000 mg at 07/21/17 0030  . [START ON 07/22/2017] acetaminophen (TYLENOL) tablet 325-650 mg  325-650 mg Oral Q6H PRN Lovell Sheehan, MD      . alum & mag hydroxide-simeth (MAALOX/MYLANTA) 200-200-20 MG/5ML suspension 30 mL  30 mL Oral Q4H PRN Lovell Sheehan, MD      . amLODipine (NORVASC) tablet 10 mg  10 mg Oral Daily Lovell Sheehan, MD   10 mg at 07/20/17 1149  . aspirin EC tablet 325 mg  325 mg Oral Q breakfast Lovell Sheehan, MD   325 mg at 07/21/17 0839  . bisacodyl (DULCOLAX) suppository 10 mg  10 mg Rectal Daily PRN Lovell Sheehan, MD      . calcium carbonate (TUMS - dosed in mg elemental calcium) chewable tablet 600 mg of elemental calcium  600 mg of elemental calcium Oral BID WC Lovell Sheehan, MD   600 mg of elemental calcium at 07/21/17 0834  . cholecalciferol (VITAMIN D) tablet 1,000 Units  1,000 Units Oral Daily Lovell Sheehan, MD   1,000 Units at 07/21/17 [phone]  .  docusate sodium (COLACE) capsule 100 mg  100 mg Oral BID Lovell Sheehan, MD   100 mg at 07/21/17 0835  . gabapentin (NEURONTIN) capsule 300 mg  300 mg Oral TID Lovell Sheehan, MD   300 mg at 07/21/17 0836  . losartan (COZAAR) tablet 100 mg  100 mg Oral Daily Lovell Sheehan, MD   100 mg at 07/20/17 1153   And  . hydrochlorothiazide (HYDRODIURIL) tablet 25 mg  25 mg Oral Daily Lovell Sheehan, MD   25 mg at 07/20/17 1153  . HYDROmorphone (DILAUDID) injection 0.5-1 mg  0.5-1 mg Intravenous Q4H PRN Lovell Sheehan, MD      . insulin aspart (novoLOG) injection 0-15 Units  0-15 Units Subcutaneous TID WC Lovell Sheehan, MD   3  Units at 07/21/17 [phone]  . insulin aspart (novoLOG) injection 0-5 Units  0-5 Units Subcutaneous QHS Lovell Sheehan, MD      . insulin aspart (novoLOG) injection 4 Units  4 Units Subcutaneous TID WC Lovell Sheehan, MD      . lactated ringers infusion   Intravenous Continuous Lovell Sheehan, MD 75 mL/hr at 07/20/17 1200    . levETIRAcetam (KEPPRA) tablet 500 mg  500 mg Oral BID Lovell Sheehan, MD   500 mg at 07/21/17 0835  . magnesium citrate solution 1 Bottle  1 Bottle Oral Once PRN Lovell Sheehan, MD      . menthol-cetylpyridinium (CEPACOL) lozenge 3 mg  1 lozenge Oral PRN Lovell Sheehan, MD       Or  . phenol (CHLORASEPTIC) mouth spray 1 spray  1 spray Mouth/Throat PRN Lovell Sheehan, MD      . metoCLOPramide (REGLAN) tablet 5-10 mg  5-10 mg Oral Q8H PRN Lovell Sheehan, MD       Or  . metoCLOPramide (REGLAN) injection 5-10 mg  5-10 mg Intravenous Q8H PRN Lovell Sheehan, MD      . metoprolol tartrate (LOPRESSOR) tablet 100 mg  100 mg Oral BID Lovell Sheehan, MD   100 mg at 07/20/17 2059  . ondansetron (ZOFRAN) tablet 4 mg  4 mg Oral Q6H PRN Lovell Sheehan, MD       Or  . ondansetron [facility]) injection 4 mg  4 mg Intravenous Q6H PRN Lovell Sheehan, MD      . oxyCODONE (Oxy IR/ROXICODONE) immediate release tablet 10-15 mg  10-15 mg Oral Q4H PRN Lovell Sheehan, MD      . oxyCODONE (Oxy IR/ROXICODONE) immediate release tablet 5-10 mg  5-10 mg Oral Q4H PRN Lovell Sheehan, MD   5 mg at 07/20/17 2105  . pneumococcal 23 valent vaccine (PNU-IMMUNE) injection 0.5 mL  0.5 mL Intramuscular Tomorrow-1000 Lovell Sheehan, MD      . senna-docusate (Senokot-S) tablet 1 tablet  1 tablet Oral QHS PRN Lovell Sheehan, MD      . spironolactone (ALDACTONE) tablet 100 mg  100 mg Oral BID Lovell Sheehan, MD   100 mg at 07/20/17 2058  . topiramate (TOPAMAX) tablet 50 mg  50 mg Oral BID Lovell Sheehan, MD   50 mg at 07/21/17 0836  . traMADol (ULTRAM) tablet 50 mg  50 mg Oral Q6H Lovell Sheehan, MD   50  mg at 07/21/17 0530     Discharge Medications: Please see discharge summary for a list of discharge medications.  Relevant Imaging Results:  Relevant Lab Results:   Additional Information (SSN:  [id number])  Smith Mince, Student-Social Work

## 2017-07-21 NOTE — Progress Notes (Signed)
Physical Therapy Treatment Patient Details Name: Timothy Maddox MRN: [id number] DOB: Apr 16, 1946 Today's Date: 07/21/2017    History of Present Illness Pt underwent R TKR. PT evaluation performed POD#0. PMH includes seizures, CVA, OA, and HTN    PT Comments    Pt reported having a good night.  Reported quad soreness.  To edge of bed with rail and ease.  Stood with min guard and was able to ambulate 62' with bariatric walker and min guard.  Overall good step-through gait pattern.  Did report increased pain with distance but no buckling or LOB noted.  Participated in exercises as described below.  Formal measurements were deferred this am as bed malfunction was preventing LE's from fully extending and nursing was addressing.  Will measure next session but overall extension significantly improved.   Follow Up Recommendations  Outpatient PT     Equipment Recommendations  3in1 (PT);Other (comment)    Recommendations for Other Services       Precautions / Restrictions Precautions Precautions: Knee Restrictions Weight Bearing Restrictions: Yes RLE Weight Bearing: Weight bearing as tolerated    Mobility  Bed Mobility Overal bed mobility: Modified Independent                Transfers Overall transfer level: Needs assistance Equipment used: Rolling walker (2 wheeled) Transfers: Sit to/from Stand Sit to Stand: Min guard         General transfer comment: cues for safe hand placement. Increased time. Pt is steady in standing with UE support on rolling walker  Ambulation/Gait Ambulation/Gait assistance: Min guard Ambulation Distance (Feet): 75 Feet Assistive device: Rolling walker (2 wheeled) Gait Pattern/deviations: Step-through pattern Gait velocity: Decreased Gait velocity interpretation: Below normal speed for age/gender     Stairs            Wheelchair Mobility    Modified Rankin (Stroke Patients Only)       Balance Overall balance assessment: Needs  assistance Sitting-balance support: No upper extremity supported Sitting balance-Leahy Scale: Good     Standing balance support: Bilateral upper extremity supported Standing balance-Leahy Scale: Fair                              Cognition Arousal/Alertness: Awake/alert Behavior During Therapy: WFL for tasks assessed/performed Overall Cognitive Status: Within Functional Limits for tasks assessed                                        Exercises Total Joint Exercises Ankle Circles/Pumps: Both;10 reps Quad Sets: Both;10 reps Hip ABduction/ADduction: Right;10 reps Straight Leg Raises: Right;10 reps Knee Flexion: 10 reps;AAROM;Right Goniometric ROM: Measurements not taken this am due to bed malfunction and unable to get bed flat for proper position.  Will measuere this pm session.    General Comments        Pertinent Vitals/Pain Pain Assessment: 0-10 Pain Score: 4  Pain Descriptors / Indicators: Sore;Aching Pain Intervention(s): Limited activity within patient's tolerance;Ice applied    Home Living                      Prior Function            PT Goals (current goals can now be found in the care plan section) Progress towards PT goals: Progressing toward goals    Frequency    BID  PT Plan Current plan remains appropriate    Co-evaluation              AM-PAC PT "6 Clicks" Daily Activity  Outcome Measure  Difficulty turning over in bed (including adjusting bedclothes, sheets and blankets)?: A Little Difficulty moving from lying on back to sitting on the side of the bed? : A Little Difficulty sitting down on and standing up from a chair with arms (e.g., wheelchair, bedside commode, etc,.)?: A Little Help needed moving to and from a bed to chair (including a wheelchair)?: A Little Help needed walking in hospital room?: A Little Help needed climbing 3-5 steps with a railing? : A Lot 6 Click Score: 17    End of  Session Equipment Utilized During Treatment: Gait belt Activity Tolerance: Patient tolerated treatment well Patient left: in chair;with call bell/phone within reach;with chair alarm set   Pain - Right/Left: Right Pain - part of body: Knee     Time: 7517-0017 PT Time Calculation (min) (ACUTE ONLY): 17 min  Charges:  $Gait Training: 8-22 mins                    G Codes:       Chesley Noon, PTA 07/21/17, 9:55 AM

## 2017-07-21 NOTE — Progress Notes (Signed)
Clinical Social Worker (CSW) received SNF consult. PT is recommending outpatient PT. RN case manager aware of above. Please reconsult if future social work needs arise. CSW signing off.   Takeia Ciaravino, LCSW [phone]  

## 2017-07-21 NOTE — Progress Notes (Signed)
Subjective:  Patient reports pain as moderate.  Pain medication is helping.   Objective:   VITALS:   Vitals:   07/20/17 1923 07/20/17 2347 07/21/17 0408 07/21/17 0745  BP: 117/72 120/71 129/69 110/66  Pulse: 60 61 69 (!) 57  Resp: 19 19 19    Temp: 97.6 F (36.4 C) 98.2 F (36.8 C) 98.6 F (37 C) 98.2 F (36.8 C)  TempSrc: Oral Oral Oral Oral  SpO2: 94% 99% 97% 97%  Weight:      Height:        PHYSICAL EXAM:  Sensation intact distally Dorsiflexion/Plantar flexion intact Incision: dressing C/D/I Compartment soft  LABS  Results for orders placed or performed during the hospital encounter of 07/20/17 (from the past 24 hour(s))  Glucose, capillary     Status: Abnormal   Collection Time: 07/20/17  4:59 PM  Result Value Ref Range   Glucose-Capillary 155 (H) 65 - 99 mg/dL  Glucose, capillary     Status: Abnormal   Collection Time: 07/20/17  9:16 PM  Result Value Ref Range   Glucose-Capillary 181 (H) 65 - 99 mg/dL  CBC     Status: Abnormal   Collection Time: 07/21/17  3:35 AM  Result Value Ref Range   WBC 11.4 (H) 3.8 - 10.6 K/uL   RBC 4.70 4.40 - 5.90 MIL/uL   Hemoglobin 14.3 13.0 - 18.0 g/dL   HCT 41.9 40.0 - 52.0 %   MCV 89.1 80.0 - 100.0 fL   MCH 30.3 26.0 - 34.0 pg   MCHC 34.0 32.0 - 36.0 g/dL   RDW 13.9 11.5 - 14.5 %   Platelets 199 150 - 440 K/uL  Basic metabolic panel     Status: Abnormal   Collection Time: 07/21/17  3:35 AM  Result Value Ref Range   Sodium 135 135 - 145 mmol/L   Potassium 4.0 3.5 - 5.1 mmol/L   Chloride 101 101 - 111 mmol/L   CO2 23 22 - 32 mmol/L   Glucose, Bld 160 (H) 65 - 99 mg/dL   BUN 30 (H) 6 - 20 mg/dL   Creatinine, Ser 1.60 (H) 0.61 - 1.24 mg/dL   Calcium 8.8 (L) 8.9 - 10.3 mg/dL   GFR calc non Af Amer 42 (L) >60 mL/min   GFR calc Af Amer 48 (L) >60 mL/min   Anion gap 11 5 - 15  Glucose, capillary     Status: Abnormal   Collection Time: 07/21/17  7:30 AM  Result Value Ref Range   Glucose-Capillary 153 (H) 65 - 99 mg/dL   Glucose, capillary     Status: Abnormal   Collection Time: 07/21/17 11:43 AM  Result Value Ref Range   Glucose-Capillary 131 (H) 65 - 99 mg/dL    Dg Knee Right Port  Result Date: 07/20/2017 CLINICAL DATA:  Status post right total knee joint prosthesis placement. Day 0. EXAM: PORTABLE RIGHT KNEE - 1-2 VIEW COMPARISON:  None in PACs FINDINGS: The patient has undergone total right knee joint prosthesis placement. Radiographic positioning of the prosthetic components is good. The interface with the native bone appears normal. Small amounts of fluid and air noted in the anterior aspect of the joint space. There is skin staples present anteriorly. IMPRESSION: No immediate postprocedure complication following right total knee joint prosthesis placement. Electronically Signed   By: David  [place] M.D.   On: 07/20/2017 11:05    Assessment/Plan: 1 Day Post-Op   Active Problems:   Osteoarthritis of right knee   Advance diet  Up with therapy D/C IV fluids Plan for discharge tomorrow if clears PT Dressing change tomorrow   Lovell Sheehan , MD 07/21/2017, 1:53 PM

## 2017-07-21 NOTE — Progress Notes (Signed)
Physical Therapy Treatment Patient Details Name: Timothy Maddox MRN: [id number] DOB: 1946/04/15 Today's Date: 07/21/2017    History of Present Illness Pt underwent R TKR. PT evaluation performed POD#0. PMH includes seizures, CVA, OA, and HTN    PT Comments    Pt in recliner, ready to walk.  Had received pain meds but remains in 8/10 pain.  Stood and ambulated 24' again this session but did not increase distance due to pain.  Stairs deferred until tomorrow AM.  Participated in exercises as described below.  4-97 degrees AAROM.  Able to do 10 SLR's.   Follow Up Recommendations  Outpatient PT     Equipment Recommendations  3in1 (PT);Other (comment)    Recommendations for Other Services       Precautions / Restrictions Precautions Precautions: Knee Restrictions Weight Bearing Restrictions: Yes RLE Weight Bearing: Weight bearing as tolerated    Mobility  Bed Mobility Overal bed mobility: Modified Independent                Transfers Overall transfer level: Needs assistance Equipment used: Rolling walker (2 wheeled) Transfers: Sit to/from Stand Sit to Stand: Supervision         General transfer comment: cues for safe transition to sitting as pt hurried due to pain and sat without turning fully,  Ambulation/Gait Ambulation/Gait assistance: Min guard Ambulation Distance (Feet): 75 Feet Assistive device: Rolling walker (2 wheeled) Gait Pattern/deviations: Step-through pattern Gait velocity: Decreased Gait velocity interpretation: Below normal speed for age/gender General Gait Details: generally steady without LOB or buckling.  Increased pain this pm.   Stairs            Wheelchair Mobility    Modified Rankin (Stroke Patients Only)       Balance Overall balance assessment: Needs assistance Sitting-balance support: No upper extremity supported Sitting balance-Leahy Scale: Good     Standing balance support: Bilateral upper extremity  supported Standing balance-Leahy Scale: Fair                              Cognition Arousal/Alertness: Awake/alert Behavior During Therapy: WFL for tasks assessed/performed Overall Cognitive Status: Within Functional Limits for tasks assessed                                        Exercises Total Joint Exercises Ankle Circles/Pumps: Both;10 reps Quad Sets: Both;10 reps Heel Slides: Right;10 reps Hip ABduction/ADduction: Right;10 reps Straight Leg Raises: Right;10 reps Long Arc Quad: 10 reps;AAROM;Right Knee Flexion: 10 reps;AAROM;Right Goniometric ROM: 4-97    General Comments        Pertinent Vitals/Pain Pain Assessment: 0-10 Pain Score: 8  Pain Location: R knee Pain Descriptors / Indicators: Sore Pain Intervention(s): Premedicated before session;Limited activity within patient's tolerance;Monitored during session;Ice applied    Home Living                      Prior Function            PT Goals (current goals can now be found in the care plan section) Progress towards PT goals: Progressing toward goals    Frequency    BID      PT Plan Current plan remains appropriate    Co-evaluation              AM-PAC PT "6 Clicks" Daily Activity  Outcome Measure  Difficulty turning over in bed (including adjusting bedclothes, sheets and blankets)?: A Little Difficulty moving from lying on back to sitting on the side of the bed? : A Little Difficulty sitting down on and standing up from a chair with arms (e.g., wheelchair, bedside commode, etc,.)?: A Little Help needed moving to and from a bed to chair (including a wheelchair)?: A Little Help needed walking in hospital room?: A Little Help needed climbing 3-5 steps with a railing? : A Lot 6 Click Score: 17    End of Session Equipment Utilized During Treatment: Gait belt Activity Tolerance: Patient tolerated treatment well Patient left: in bed;with bed alarm set;with  call bell/phone within reach;with family/visitor present;with SCD's reapplied   Pain - Right/Left: Right Pain - part of body: Knee     Time: 5790-3833 PT Time Calculation (min) (ACUTE ONLY): 20 min  Charges:  $Gait Training: 8-22 mins                    G Codes:       Chesley Noon, PTA 07/21/17, 1:07 PM

## 2017-07-21 NOTE — Care Management (Signed)
Met with patient at bedside to discuss discharge planning. Patient will be discharging to his daughters home. He has outpatient PT scheduled for next week at Dr. Harlow Mares office. He has a walker. Will have ASA as an anticoagulant. He verbalized understanding of his discharge plan. Instructed him to call for [facility] if he had any further concerns.

## 2017-07-22 LAB — CBC
HEMATOCRIT: 38.7 % — AB (ref 40.0–52.0)
Hemoglobin: 13.2 g/dL (ref 13.0–18.0)
MCH: 30.9 pg (ref 26.0–34.0)
MCHC: 34.3 g/dL (ref 32.0–36.0)
MCV: 90.2 fL (ref 80.0–100.0)
PLATELETS: 198 10*3/uL (ref 150–440)
RBC: 4.29 MIL/uL — ABNORMAL LOW (ref 4.40–5.90)
RDW: 13.8 % (ref 11.5–14.5)
WBC: 8.2 10*3/uL (ref 3.8–10.6)

## 2017-07-22 LAB — GLUCOSE, CAPILLARY
GLUCOSE-CAPILLARY: 137 mg/dL — AB (ref 65–99)
Glucose-Capillary: 105 mg/dL — ABNORMAL HIGH (ref 65–99)

## 2017-07-22 MED ORDER — DOCUSATE SODIUM 100 MG PO CAPS: 100.0000 mg | ORAL_CAPSULE | Freq: Two times a day (BID) | ORAL | 0 refills | Status: DC

## 2017-07-22 MED ORDER — ASPIRIN 325 MG PO TBEC: 325.0000 mg | DELAYED_RELEASE_TABLET | Freq: Every day | ORAL | 0 refills | Status: DC

## 2017-07-22 MED ORDER — OXYCODONE HCL 5 MG PO TABS: 5.0000 mg | ORAL_TABLET | Freq: Four times a day (QID) | ORAL | 0 refills | Status: DC | PRN

## 2017-07-22 NOTE — Care Management (Signed)
Family requesting bedside commode. Ordered from [facility].

## 2017-07-22 NOTE — Progress Notes (Signed)
Physical Therapy Treatment Patient Details Name: Timothy Maddox MRN: [id number] DOB: August 05, 1945 Today's Date: 07/22/2017    History of Present Illness Pt underwent R TKR. PT evaluation performed POD#0. PMH includes seizures, CVA, OA, and HTN    PT Comments    Pt is modified independent for bed mobility and supervision only for transfers. Cues for safe hand placement during transfers. Once upright pt is steady in standing with UE support on rolling walker. Decreased weight shift noted to RLE. Pt is able to ambulate from his room to the rehab gym. He reports increase in pain and is somewhat self-limiting but with encouragement is able to complete the distance. Pt sits down to rest in rehab gym. He becomes slightly less conversational and slow to answer questions. Pt denies dizziness/lightheadedness. Vitals obtained and are within acceptable limits. Pt gradually becomes more conversational. Pt provided education and cues about how to complete stairs with bilateral rails and correct sequencing. He is stable on stairs. Education provided about how to perform stairs backwards with walker. Pt is able to view a demonstration by another patient when first arriving at rehab gym. No family present during stair training. Given fatigue and lethargy deferred further ambulation after stair training and pt taken back to room in recliner. Pt is safe to discharge home when medically appropriate. Per patient he is planning to go to his daughters house at discharge which has 3 steps to enter and no rails. He would be able to complete these steps backwards with walker however it would be much easier for patient to return to his own apartment where he has an elevator and no stairs. Pt will benefit from PT services to address deficits in strength, balance, and mobility in order to return to full function at home.    Follow Up Recommendations  Outpatient PT     Equipment Recommendations  3in1 (PT);Other (comment)(Pt may  be able to borrow [facility])    Recommendations for Other Services OT consult     Precautions / Restrictions Precautions Precautions: Knee Precaution Booklet Issued: Yes (comment) Restrictions Weight Bearing Restrictions: Yes RLE Weight Bearing: Weight bearing as tolerated    Mobility  Bed Mobility Overal bed mobility: Modified Independent             General bed mobility comments: Increased time, HOB elevated, use of bed rails  Transfers Overall transfer level: Needs assistance Equipment used: Rolling walker (2 wheeled) Transfers: Sit to/from Stand Sit to Stand: Supervision         General transfer comment: Cues for safe hand placement during transfers. Once upright pt is steady in standing with UE support on rolling walker. Decreased weight shift noted to RLE. Given fatigue and lethargy deferred further ambulation after stair training and pt taken back to room in recliner  Ambulation/Gait Ambulation/Gait assistance: Min guard Ambulation Distance (Feet): 150 Feet Assistive device: Rolling walker (2 wheeled) Gait Pattern/deviations: Step-through pattern     General Gait Details: Pt is able to ambulate from his room to the rehab gym. He reports increase in pain and is somewhat self-limiting but with encouragement is able to complete the distance. Pt sits down to rest in rehab gym. He becomes slightly less conversational and slow to answer questions. Pt denies dizziness/lightheadedness. Vitals obtained and are within acceptable limits. Pt gradually becomes more conversational.   Stairs Stairs: Yes   Stair Management: Two rails;Step to pattern Number of Stairs: 4 General stair comments: Pt provided education and cues about how to complete stairs  with bilateral rails and correct sequencing. He is stable on stairs. Education provided about how to perform stairs backwards with walker. Pt is able to view a demonstration by another patient when first arriving at rehab gym. No  family present during stair training  Wheelchair Mobility    Modified Rankin (Stroke Patients Only)       Balance Overall balance assessment: Needs assistance Sitting-balance support: No upper extremity supported Sitting balance-Leahy Scale: Good     Standing balance support: Bilateral upper extremity supported Standing balance-Leahy Scale: Fair                              Cognition Arousal/Alertness: Awake/alert Behavior During Therapy: Flat affect Overall Cognitive Status: Within Functional Limits for tasks assessed                                        Exercises Total Joint Exercises Goniometric ROM: -12 to 78 degrees AAROM, pain limited    General Comments        Pertinent Vitals/Pain Pain Assessment: 0-10 Pain Score: 9  Pain Location: R knee Pain Descriptors / Indicators: Sore Pain Intervention(s): Monitored during session    Home Living                      Prior Function            PT Goals (current goals can now be found in the care plan section) Acute Rehab PT Goals Patient Stated Goal: Return to prior level of function PT Goal Formulation: With patient/family Time For Goal Achievement: 08/03/17 Potential to Achieve Goals: Good Progress towards PT goals: Progressing toward goals    Frequency    BID      PT Plan Current plan remains appropriate    Co-evaluation              AM-PAC PT "6 Clicks" Daily Activity  Outcome Measure  Difficulty turning over in bed (including adjusting bedclothes, sheets and blankets)?: A Little Difficulty moving from lying on back to sitting on the side of the bed? : A Little Difficulty sitting down on and standing up from a chair with arms (e.g., wheelchair, bedside commode, etc,.)?: A Little Help needed moving to and from a bed to chair (including a wheelchair)?: A Little Help needed walking in hospital room?: A Little Help needed climbing 3-5 steps with a  railing? : A Little 6 Click Score: 18    End of Session Equipment Utilized During Treatment: Gait belt Activity Tolerance: Patient tolerated treatment well Patient left: with call bell/phone within reach;with SCD's reapplied;in chair;with chair alarm set;Other (comment)(polar care in place, towel roll under heel) Nurse Communication: Mobility status;Other (comment)(Flat affect and lethargy) PT Visit Diagnosis: Muscle weakness (generalized) (M62.81);Difficulty in walking, not elsewhere classified (R26.2);Pain Pain - Right/Left: Right Pain - part of body: Knee     Time: 2878-6767 PT Time Calculation (min) (ACUTE ONLY): 34 min  Charges:  $Gait Training: 23-37 mins                    G Codes:       Lyndel Safe Freeda Spivey PT, DPT     Sanam Marmo 07/22/2017, 11:05 AM

## 2017-07-22 NOTE — Discharge Summary (Signed)
Physician Discharge Summary  Patient ID: Timothy Maddox MRN: [id number] DOB/AGE: Nov 10, 1945 72 y.o.  Admit date: 07/20/2017 Discharge date: 07/22/2017  Admission Diagnoses:  M17.11 Unilateral primary osteoarthritis, right knee <principal problem not specified>  Discharge Diagnoses:  M17.11 Unilateral primary osteoarthritis, right knee Active Problems:   Osteoarthritis of right knee   Past Medical History:  Diagnosis Date  . Arthritis    lower back, shoulders  . AVM (arteriovenous malformation) brain   . Cancer [facility])    prostate- treated with radiation  . CVA (cerebral infarction) 11/2015  . Dyspnea    with exertion  . HOH (hard of hearing)   . Hyperlipidemia   . Hypertension   . Pneumonia    1st grade  . Pre-diabetes   . Prediabetes   . Seizures ([place]) 12/2015  . Stroke [facility])    speech impairment, no weakness    Surgeries: Procedure(s): TOTAL KNEE ARTHROPLASTY on 07/20/2017   Consultants (if any):   Discharged Condition: Improved  Hospital Course: Timothy Maddox is an 72 y.o. male who was admitted 07/20/2017 with a diagnosis of  M17.11 Unilateral primary osteoarthritis, right knee <principal problem not specified> and went to the operating room on 07/20/2017 and underwent the above named procedures.    He was given perioperative antibiotics:  Anti-infectives (From admission, onward)   Start     Dose/Rate Route Frequency Ordered Stop   07/20/17 1330  ceFAZolin (ANCEF) IVPB 2g/100 mL premix     2 g 200 mL/hr over 30 Minutes Intravenous Every 6 hours 07/20/17 0950 07/20/17 2128   07/20/17 0813  bacitracin 50,000 Units in sodium chloride irrigation 0.9 % 500 mL irrigation  Status:  Discontinued       As needed 07/20/17 0813 07/20/17 0938   07/20/17 0635  ceFAZolin (ANCEF) 2-4 GM/100ML-% IVPB  Status:  Discontinued    Comments:  Lyman Bishop   : cabinet override      07/20/17 0635 07/20/17 0748   07/20/17 0611  ceFAZolin (ANCEF) 2-4 GM/100ML-% IVPB    Comments:   Phineas Real   : cabinet override      07/20/17 0611 07/20/17 1829   07/20/17 0600  ceFAZolin (ANCEF) IVPB 2g/100 mL premix     2 g 200 mL/hr over 30 Minutes Intravenous On call to O.R. 07/19/17 2203 07/20/17 0735    .  He was given sequential compression devices, early ambulation, and ECASA for DVT prophylaxis.  He benefited maximally from the hospital stay and there were no complications.    Recent vital signs:  Vitals:   07/22/17 0750 07/22/17 0913  BP: 125/75 112/65  Pulse: (!) 57 61  Resp: 16   Temp: 98.4 F (36.9 C)   SpO2: 93% 91%    Recent laboratory studies:  Lab Results  Component Value Date   HGB 13.2 07/22/2017   HGB 14.3 07/21/2017   HGB 16.8 07/08/2017   Lab Results  Component Value Date   WBC 8.2 07/22/2017   PLT 198 07/22/2017   Lab Results  Component Value Date   INR 0.94 07/08/2017   Lab Results  Component Value Date   NA 135 07/21/2017   K 4.0 07/21/2017   CL 101 07/21/2017   CO2 23 07/21/2017   BUN 30 (H) 07/21/2017   CREATININE 1.60 (H) 07/21/2017   GLUCOSE 160 (H) 07/21/2017    Discharge Medications:   Allergies as of 07/22/2017      Reactions   Poison Oak Extract Rash   Poison  Ivy Extract Rash      Medication List    TAKE these medications   acetaminophen 500 MG tablet Commonly known as:  TYLENOL Take 500 mg by mouth every 6 (six) hours as needed.   amLODipine 10 MG tablet Commonly known as:  NORVASC Take 10 mg by mouth daily.   aspirin 325 MG EC tablet Take 1 tablet (325 mg total) by mouth daily with breakfast. Start taking on:  07/23/2017   calcium carbonate 1500 (600 Ca) MG Tabs tablet Commonly known as:  OSCAL Take 600 mg of elemental calcium by mouth 2 (two) times daily with a meal.   cholecalciferol 1000 units tablet Commonly known as:  VITAMIN D Take 1,000 Units by mouth daily.   docusate sodium 100 MG capsule Commonly known as:  COLACE Take 1 capsule (100 mg total) by mouth 2 (two) times daily.    losartan-hydrochlorothiazide 100-25 MG tablet Commonly known as:  HYZAAR Take 1 tablet by mouth daily.   metoprolol tartrate 100 MG tablet Commonly known as:  LOPRESSOR Take 100 mg by mouth 2 (two) times daily.   oxyCODONE 5 MG immediate release tablet Commonly known as:  Oxy IR/ROXICODONE Take 1-2 tablets (5-10 mg total) by mouth every 6 (six) hours as needed for moderate pain (pain score 4-6).   ROWEEPRA 500 MG tablet Generic drug:  levETIRAcetam Take 500 mg by mouth 2 (two) times daily.   spironolactone 100 MG tablet Commonly known as:  ALDACTONE Take 100 mg by mouth 2 (two) times daily.   topiramate 50 MG tablet Commonly known as:  TOPAMAX Take 1 tablet (50 mg total) by mouth 2 (two) times daily. Start one tablet daily x 1 week then twice daily            Durable Medical Equipment  (From admission, onward)        Start     Ordered   07/22/17 1108  For home use only DME Bedside commode  Once    Question:  Patient needs a bedside commode to treat with the following condition  Answer:  Weakness   07/22/17 1108      Diagnostic Studies: Dg Knee Right Port  Result Date: 07/20/2017 CLINICAL DATA:  Status post right total knee joint prosthesis placement. Day 0. EXAM: PORTABLE RIGHT KNEE - 1-2 VIEW COMPARISON:  None in PACs FINDINGS: The patient has undergone total right knee joint prosthesis placement. Radiographic positioning of the prosthetic components is good. The interface with the native bone appears normal. Small amounts of fluid and air noted in the anterior aspect of the joint space. There is skin staples present anteriorly. IMPRESSION: No immediate postprocedure complication following right total knee joint prosthesis placement. Electronically Signed   By: David  Martinique M.D.   On: 07/20/2017 11:05    Disposition: Discharge disposition: 01-Home or Self Care            Signed: Lovell Sheehan ,MD 07/22/2017, 4:13 PM

## 2017-07-22 NOTE — Discharge Instructions (Signed)
Continue weight bear as tolerated on the right lower extremity.   ° °Elevate the right lower extremity whenever possible and continue the polar care while elevating the extremity. Patient may shower. No bath or submerging the wound.   ° °Take ECASA as directed for blood clot prevention. ° °Continue to work on knee range of motion exercises at home as instructed by physical therapy. Continue to use a walker for assistance with ambulation until cleared by physical therapy. ° °Call [phone] with any questions, such as fever > 101.5 degrees, drainage from the wound or shortness of breath. ° ° °

## 2017-07-22 NOTE — Progress Notes (Signed)
  Subjective:  Patient reports pain as moderate.  No other complaints.  Objective:   VITALS:   Vitals:   07/21/17 2027 07/22/17 0453 07/22/17 0750 07/22/17 0913  BP: 106/67 (!) 106/58 125/75 112/65  Pulse: 60 (!) 55 (!) 57 61  Resp:   16   Temp: 98.5 F (36.9 C)  98.4 F (36.9 C)   TempSrc: Oral  Oral   SpO2: 96% 95% 93% 91%  Weight:      Height:        PHYSICAL EXAM:  Sensation intact distally Dorsiflexion/Plantar flexion intact Incision: dressing C/D/I Compartment soft  LABS  Results for orders placed or performed during the hospital encounter of 07/20/17 (from the past 24 hour(s))  Glucose, capillary     Status: Abnormal   Collection Time: 07/21/17 11:43 AM  Result Value Ref Range   Glucose-Capillary 131 (H) 65 - 99 mg/dL  Glucose, capillary     Status: Abnormal   Collection Time: 07/21/17  4:51 PM  Result Value Ref Range   Glucose-Capillary 129 (H) 65 - 99 mg/dL  Glucose, capillary     Status: Abnormal   Collection Time: 07/21/17  9:15 PM  Result Value Ref Range   Glucose-Capillary 137 (H) 65 - 99 mg/dL  CBC     Status: Abnormal   Collection Time: 07/22/17  4:46 AM  Result Value Ref Range   WBC 8.2 3.8 - 10.6 K/uL   RBC 4.29 (L) 4.40 - 5.90 MIL/uL   Hemoglobin 13.2 13.0 - 18.0 g/dL   HCT 38.7 (L) 40.0 - 52.0 %   MCV 90.2 80.0 - 100.0 fL   MCH 30.9 26.0 - 34.0 pg   MCHC 34.3 32.0 - 36.0 g/dL   RDW 13.8 11.5 - 14.5 %   Platelets 198 150 - 440 K/uL  Glucose, capillary     Status: Abnormal   Collection Time: 07/22/17  7:48 AM  Result Value Ref Range   Glucose-Capillary 137 (H) 65 - 99 mg/dL    Dg Knee Right Port  Result Date: 07/20/2017 CLINICAL DATA:  Status post right total knee joint prosthesis placement. Day 0. EXAM: PORTABLE RIGHT KNEE - 1-2 VIEW COMPARISON:  None in PACs FINDINGS: The patient has undergone total right knee joint prosthesis placement. Radiographic positioning of the prosthetic components is good. The interface with the native bone  appears normal. Small amounts of fluid and air noted in the anterior aspect of the joint space. There is skin staples present anteriorly. IMPRESSION: No immediate postprocedure complication following right total knee joint prosthesis placement. Electronically Signed   By: David  Martinique M.D.   On: 07/20/2017 11:05    Assessment/Plan: 2 Days Post-Op   Active Problems:   Osteoarthritis of right knee   Up with therapy  Plan discharge to home today with outpatient PT   Lovell Sheehan , MD 07/22/2017, 10:34 AM

## 2017-07-22 NOTE — Progress Notes (Signed)
Patient received scheduled pain medication and now requesting more pain medication. Patient is sleeping every time this writer enters room and very drowsy.

## 2017-08-03 ENCOUNTER — Encounter: Payer: Self-pay | Admitting: Orthopedic Surgery

## 2017-08-04 ENCOUNTER — Encounter: Payer: Self-pay | Admitting: Orthopedic Surgery

## 2017-10-16 ENCOUNTER — Other Ambulatory Visit: Payer: Medicare HMO

## 2017-10-16 DIAGNOSIS — C61 Malignant neoplasm of prostate: Secondary | ICD-10-CM

## 2017-10-17 LAB — TESTOSTERONE: TESTOSTERONE: 7 ng/dL — AB (ref 264–916)

## 2017-10-17 LAB — PSA: Prostate Specific Ag, Serum: 5.7 ng/mL — ABNORMAL HIGH (ref 0.0–4.0)

## 2017-10-23 ENCOUNTER — Ambulatory Visit: Payer: Medicare HMO

## 2017-11-11 ENCOUNTER — Ambulatory Visit (INDEPENDENT_AMBULATORY_CARE_PROVIDER_SITE_OTHER): Payer: Medicare HMO | Admitting: Urology

## 2017-11-11 ENCOUNTER — Encounter: Payer: Self-pay | Admitting: Urology

## 2017-11-11 VITALS — BP 122/72 | HR 66 | Ht 73.0 in | Wt 287.4 lb

## 2017-11-11 DIAGNOSIS — C61 Malignant neoplasm of prostate: Secondary | ICD-10-CM | POA: Diagnosis not present

## 2017-11-11 MED ORDER — LEUPROLIDE ACETATE (3 MONTH) 22.5 MG IM KIT
22.5000 mg | PACK | Freq: Once | INTRAMUSCULAR | Status: AC
  Administered 2017-11-11: 22.5 mg via INTRAMUSCULAR

## 2017-11-11 NOTE — Progress Notes (Signed)
Lupron IM Injection   Due to Prostate Cancer patient is present today for a Lupron Injection.  Medication: Lupron 3 month Dose: 22.5 mg  Location: right upper outer buttocks Lot: [id number] Exp: 03/25/2020  Patient tolerated well, no complications were noted  Performed by: Fonnie Jarvis, CMA  Follow up: 3 month

## 2017-11-13 NOTE — Progress Notes (Signed)
11/11/2017 1:37 PM   Timothy Maddox [birth year] [id number]  Referring provider: [name], MD [place], [place] [address]  Chief Complaint  Patient presents with  . 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 Marian Sorrow at [facility] and then by Dr. Christiane Ha in [place] May 2018.  1 is is is in 2013 he was diagnosed with a Gleason 5+4 = 9 with 100% involvement in 6 of 6 cores on the left and 3 of 6 cores on the right. MRI was negative for metastasis but showed T3/ECE on the right side, bone scan was normal.   Treated with Lupron October 2013 and completed IMRT February 2014 with 18 months ADT.   PSA rise 2018. Pt thinks it was about 10.Repeat PSA in August 2018 was 9.3. CT/bone scan negative for metastatic disease. Testosterone was 53 in August 2018.He was started on a 14-month Lupron shot at that time.   -PSA 4.12 April 2017 -PSA 5.06 July 2017 Testosterone levels have been castrate    HPI: 72 year old male presents for follow-up of the above problem list. Denies bothersome lower urinary tract symptoms.  His fatigue is stable.  A follow-up PSA drawn on 6/14 was slightly higher at 5.7.   PMH: Past Medical History:  Diagnosis Date  . Arthritis    lower back, shoulders  . AVM (arteriovenous malformation) brain   . Cancer [facility])    prostate- treated with radiation  . CVA (cerebral infarction) 11/2015  . Dyspnea    with exertion  . HOH (hard of hearing)   . Hyperlipidemia   . Hypertension   . Pneumonia    1st grade  . Pre-diabetes   . Prediabetes   . Seizures ([place]) 12/2015  . Stroke [facility])    speech impairment, no weakness    Surgical History: Past Surgical History:  Procedure Laterality Date  . BACK SURGERY  1999   disectomy  . BACK SURGERY     2 surgery- injury  . COLONOSCOPY W/ POLYPECTOMY    . HAMMER TOE SURGERY Bilateral   . INGUINAL HERNIA REPAIR Right    per patient when he was in  1st grade  . IR GENERIC HISTORICAL  12/05/2015   IR RADIOLOGIST EVAL & MGMT 12/05/2015 [facility]  . IR GENERIC HISTORICAL  01/31/2016   IR RADIOLOGIST EVAL & MGMT 01/31/2016 [facility]  . IR GENERIC HISTORICAL  02/20/2016   IR ANGIO INTRA EXTRACRAN SEL COM CAROTID INNOMINATE UNI L MOD SED 02/20/2016 [name], MD [facility]  . IR GENERIC HISTORICAL  02/20/2016   IR 3D INDEPENDENT WKST 02/20/2016 [name], MD [facility]  . KNEE SURGERY Bilateral 2015  . RADIOLOGY WITH ANESTHESIA N/A 02/20/2016   Procedure: EMBOLIZATION;  Surgeon: [name], MD;  Location: [place];  Service: Radiology;  Laterality: N/A;  . TOE SURGERY Left 2017   TOE NAIL REMOVAL  . TOTAL KNEE ARTHROPLASTY Right 07/20/2017   Procedure: TOTAL KNEE ARTHROPLASTY;  Surgeon: Lovell Sheehan, MD;  Location: [facility];  Service: Orthopedics;  Laterality: Right;    Home Medications:  Allergies as of 11/11/2017      Reactions   Poison Oak Extract Rash   Poison Ivy Extract Rash      Medication List        Accurate as of 11/11/17 11:59 PM. Always use your most recent med list.          acetaminophen 500 MG  tablet Commonly known as:  TYLENOL Take 500 mg by mouth every 6 (six) hours as needed.   amLODipine 10 MG tablet Commonly known as:  NORVASC Take 10 mg by mouth daily.   aspirin 325 MG EC tablet Take 1 tablet (325 mg total) by mouth daily with breakfast.   calcium carbonate 1500 (600 Ca) MG Tabs tablet Commonly known as:  OSCAL Take 600 mg of elemental calcium by mouth 2 (two) times daily with a meal.   cholecalciferol 1000 units tablet Commonly known as:  VITAMIN D Take 1,000 Units by mouth daily.   docusate sodium 100 MG capsule Commonly known as:  COLACE Take 1 capsule (100 mg total) by mouth 2 (two) times daily.   losartan-hydrochlorothiazide 100-25 MG tablet Commonly known as:  HYZAAR Take 1 tablet by mouth daily.   metoprolol succinate 100 MG 24 hr tablet Commonly  known as:  TOPROL-XL Take by mouth.   metoprolol tartrate 100 MG tablet Commonly known as:  LOPRESSOR Take 100 mg by mouth 2 (two) times daily.   oxyCODONE 5 MG immediate release tablet Commonly known as:  Oxy IR/ROXICODONE Take 1-2 tablets (5-10 mg total) by mouth every 6 (six) hours as needed for moderate pain (pain score 4-6).   rosuvastatin 5 MG tablet Commonly known as:  CRESTOR   ROWEEPRA 500 MG tablet Generic drug:  levETIRAcetam Take 500 mg by mouth 2 (two) times daily.   spironolactone 100 MG tablet Commonly known as:  ALDACTONE Take 100 mg by mouth 2 (two) times daily.   topiramate 50 MG tablet Commonly known as:  TOPAMAX Take 1 tablet (50 mg total) by mouth 2 (two) times daily. Start one tablet daily x 1 week then twice daily       Allergies:  Allergies  Allergen Reactions  . Poison Oak Extract Rash  . Poison Ivy Extract Rash    Family History: Family History  Problem Relation Age of Onset  . Heart failure Father   . Diabetes Father   . Hypertension Father   . Alzheimer's disease Father 3  . Other Mother 63       homicide    Social History:  reports that he has quit smoking. He quit after 2.00 years of use. He has never used smokeless tobacco. He reports that he does not drink alcohol or use drugs.  ROS: UROLOGY Frequent Urination?: No Hard to postpone urination?: No Burning/pain with urination?: Yes Get up at night to urinate?: No Leakage of urine?: No Urine stream starts and stops?: Yes Trouble starting stream?: No Do you have to strain to urinate?: No Blood in urine?: No Urinary tract infection?: No Sexually transmitted disease?: No Injury to kidneys or bladder?: No Painful intercourse?: No Weak stream?: No Erection problems?: Yes Penile pain?: No  Gastrointestinal Nausea?: No Vomiting?: No Indigestion/heartburn?: No Diarrhea?: No Constipation?: No  Constitutional Fever: No Night sweats?: No Weight loss?: No Fatigue?:  No  Skin Skin rash/lesions?: No Itching?: No  Eyes Blurred vision?: Yes Double vision?: No  Ears/Nose/Throat Sore throat?: No Sinus problems?: Yes  Hematologic/Lymphatic Swollen glands?: No Easy bruising?: No  Cardiovascular Leg swelling?: No Chest pain?: No  Respiratory Cough?: Yes Shortness of breath?: No  Endocrine Excessive thirst?: No  Musculoskeletal Back pain?: Yes Joint pain?: No  Neurological Headaches?: No Dizziness?: No  Psychologic Depression?: No Anxiety?: No  Physical Exam: BP 122/72 (BP Location: Left Arm, Patient Position: Sitting, Cuff Size: Large)   Pulse 66   Ht 6\' 1"  (1.854  m)   Wt 287 lb 6.4 oz (130.4 kg)   BMI 37.92 kg/m   Constitutional:  Alert and oriented, No acute distress. HEENT: [place] AT, moist mucus membranes.  Trachea midline, no masses. Cardiovascular: No clubbing, cyanosis, or edema. Respiratory: Normal respiratory effort, no increased work of breathing. GI: Abdomen is soft, nontender, nondistended, no abdominal masses GU: No CVA tenderness Lymph: No cervical or inguinal lymphadenopathy. Skin: No rashes, bruises or suspicious lesions. Neurologic: Grossly intact, no focal deficits, moving all 4 extremities. Psychiatric: Normal mood and affect.  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  PSA nadir after instituting ADT was only 4.9 and his PSA has risen slowly.  I discussed starting enzalutamide or apalutamide.  Potential side effects were discussed.  He desires to start therapy.  He received Lupron 22.5 mg today.  Follow-up 3 months with a PSA.   Abbie Sons, [place] [address], [place], [place] [address] [phone]

## 2017-11-15 ENCOUNTER — Encounter: Payer: Self-pay | Admitting: Urology

## 2017-11-25 ENCOUNTER — Other Ambulatory Visit: Payer: Self-pay

## 2017-11-25 MED ORDER — APALUTAMIDE 60 MG PO TABS: 240.0000 mg | ORAL_TABLET | Freq: Every day | ORAL | 11 refills | Status: DC

## 2017-11-25 NOTE — Progress Notes (Signed)
Script placed for [employer] and notes and labs sent to [facility] Damaris Schooner w/ [facility] and she will contact patient to fill and notify our office if prior Josem Kaufmann is needed

## 2017-12-04 ENCOUNTER — Telehealth: Payer: Self-pay | Admitting: Urology

## 2017-12-04 NOTE — Telephone Encounter (Signed)
[facility] called and stated that [place] that we prescribed this patient decreases the effeteness of the oxycodone that the patient is taking when taken together. They want to know what if anything should be done about it? They can be reached at [facility]

## 2017-12-06 NOTE — Telephone Encounter (Signed)
Ok to start and will see how is pain control does

## 2017-12-07 ENCOUNTER — Telehealth: Payer: Self-pay | Admitting: Urology

## 2017-12-07 NOTE — Telephone Encounter (Signed)
Spoke w/ pharmacy, they wanted to inform us that Erleada may cause reduced effects of the oxycodone. Informed pharmacy to proceed.

## 2017-12-07 NOTE — Telephone Encounter (Signed)
parmacy LMOM asking for someone to call about pts medication, did not leave specifics, just to call [phone] opt 3 followed by opt 1 - Please advise, Thanks.

## 2017-12-09 ENCOUNTER — Telehealth: Payer: Self-pay | Admitting: Urology

## 2017-12-09 DIAGNOSIS — C61 Malignant neoplasm of prostate: Secondary | ICD-10-CM

## 2017-12-09 NOTE — Telephone Encounter (Signed)
[place] called to make you aware that pt has seizure disorders.  The medication he is currently taking, Erleada, can cause a seizure.  Just F.Y.I.

## 2017-12-10 NOTE — Telephone Encounter (Signed)
Please place referral for Cancer center.

## 2017-12-10 NOTE — Telephone Encounter (Signed)
Order was placed.

## 2017-12-16 ENCOUNTER — Inpatient Hospital Stay: Payer: Medicare HMO | Attending: Oncology | Admitting: Oncology

## 2017-12-16 ENCOUNTER — Encounter (INDEPENDENT_AMBULATORY_CARE_PROVIDER_SITE_OTHER): Payer: Self-pay

## 2017-12-16 ENCOUNTER — Other Ambulatory Visit: Payer: Self-pay

## 2017-12-16 ENCOUNTER — Encounter: Payer: Self-pay | Admitting: Oncology

## 2017-12-16 ENCOUNTER — Inpatient Hospital Stay: Payer: Medicare HMO

## 2017-12-16 VITALS — BP 110/69 | HR 52 | Temp 96.9°F | Resp 18 | Ht 73.0 in | Wt 290.5 lb

## 2017-12-16 DIAGNOSIS — I10 Essential (primary) hypertension: Secondary | ICD-10-CM | POA: Insufficient documentation

## 2017-12-16 DIAGNOSIS — R972 Elevated prostate specific antigen [PSA]: Secondary | ICD-10-CM | POA: Insufficient documentation

## 2017-12-16 DIAGNOSIS — C61 Malignant neoplasm of prostate: Secondary | ICD-10-CM | POA: Insufficient documentation

## 2017-12-16 DIAGNOSIS — Z87891 Personal history of nicotine dependence: Secondary | ICD-10-CM | POA: Diagnosis not present

## 2017-12-16 DIAGNOSIS — R7303 Prediabetes: Secondary | ICD-10-CM | POA: Insufficient documentation

## 2017-12-16 DIAGNOSIS — R569 Unspecified convulsions: Secondary | ICD-10-CM | POA: Diagnosis not present

## 2017-12-16 LAB — COMPREHENSIVE METABOLIC PANEL
ALK PHOS: 38 U/L (ref 38–126)
ALT: 32 U/L (ref 0–44)
ANION GAP: 12 (ref 5–15)
AST: 28 U/L (ref 15–41)
Albumin: 4.3 g/dL (ref 3.5–5.0)
BILIRUBIN TOTAL: 0.5 mg/dL (ref 0.3–1.2)
BUN: 28 mg/dL — ABNORMAL HIGH (ref 8–23)
CALCIUM: 9.6 mg/dL (ref 8.9–10.3)
CO2: 26 mmol/L (ref 22–32)
Chloride: 102 mmol/L (ref 98–111)
Creatinine, Ser: 1.37 mg/dL — ABNORMAL HIGH (ref 0.61–1.24)
GFR calc Af Amer: 58 mL/min — ABNORMAL LOW (ref >60)
GFR calc non Af Amer: 50 mL/min — ABNORMAL LOW (ref >60)
GLUCOSE: 121 mg/dL — AB (ref 70–99)
POTASSIUM: 4.6 mmol/L (ref 3.5–5.1)
Sodium: 140 mmol/L (ref 135–145)
Total Protein: 8 g/dL (ref 6.5–8.1)

## 2017-12-16 LAB — CBC WITH DIFFERENTIAL/PLATELET
BASOS PCT: 1 %
Basophils Absolute: 0 10*3/uL (ref 0–0.1)
EOS ABS: 0.2 10*3/uL (ref 0–0.7)
Eosinophils Relative: 4 %
HCT: 45.5 % (ref 40.0–52.0)
HEMOGLOBIN: 15.4 g/dL (ref 13.0–18.0)
Lymphocytes Relative: 27 %
Lymphs Abs: 1.4 10*3/uL (ref 1.0–3.6)
MCH: 31.3 pg (ref 26.0–34.0)
MCHC: 33.9 g/dL (ref 32.0–36.0)
MCV: 92.3 fL (ref 80.0–100.0)
MONOS PCT: 14 %
Monocytes Absolute: 0.7 10*3/uL (ref 0.2–1.0)
NEUTROS ABS: 2.9 10*3/uL (ref 1.4–6.5)
NEUTROS PCT: 54 %
Platelets: 212 10*3/uL (ref 150–440)
RBC: 4.93 MIL/uL (ref 4.40–5.90)
RDW: 13.7 % (ref 11.5–14.5)
WBC: 5.3 10*3/uL (ref 3.8–10.6)

## 2017-12-16 LAB — PSA: Prostatic Specific Antigen: 6.49 ng/mL — ABNORMAL HIGH (ref 0.00–4.00)

## 2017-12-16 NOTE — Progress Notes (Signed)
Patient here for initial visit. °

## 2017-12-16 NOTE — Progress Notes (Signed)
Hematology/Oncology Consult note [facility] Telephone:([phone] [phone]   Patient Care Team: [name], MD as PCP - General (Internal Medicine)  REFERRING PROVIDER: Dr.Stoioff  CHIEF COMPLAINTS/REASON FOR VISIT:  Evaluation of   HISTORY OF PRESENTING ILLNESS:  Timothy Maddox is a  72 y.o.  male with PMH listed below who was referred to me for evaluation of prostate cancer.  Prostate cancer diagnosis dated back to 2013.  I do  Not have his previous oncology medical records. Per Dr.Stoioff, he completed IMRT in February 2014 followed by androgen deprivation therapy. Patient previously followed Dr. Marian Sorrow at [facility] urology and then [facility] in [place].  Recently he was seen by Dr. John Giovanni at [facility].  Per urology notes, patient was diagnosed with a Gleason score 5+4 with 100% involvement in 6 out of 6 cores on the left and a 3 out of 6 cores on the right.  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  Bone scan was normal at that time.  Patient was treated with Lupron for about 18 months. PSA starts to rise.  August 2018 PSA was 9.3.  Patient had a CT and bone scan done was negative for metastatic disease.  Testosterone level was 53 in August 2018.  Patient was restarted on Lupron injection every 3 months since then.  December 2018 PSA decreased to 4.9 after ADT restarted. March 2019 PSA 5.4 Testosterone was checked in June 2019 within castration level.  Patient reports chronic back pain.  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  History of seizure, takes Roweepra 500 twice daily, Topiramate 5mg  BID. Marland Kitchen  Daughter accompanies patient to today's visit.  Review of Systems  Constitutional: Negative for malaise/fatigue and weight loss.  HENT: Negative for hearing loss.   Eyes: Negative for photophobia and pain.  Respiratory: Negative for cough and sputum production.   Cardiovascular:  Negative for palpitations and orthopnea.  Gastrointestinal: Negative for nausea and vomiting.  Genitourinary: Negative for dysuria.  Musculoskeletal: Positive for back pain.  Skin: Negative for rash.  Neurological: Negative for dizziness.  Endo/Heme/Allergies: Does not bruise/bleed easily.  Psychiatric/Behavioral: Negative for depression.    MEDICAL HISTORY:  Past Medical History:  Diagnosis Date  . Arthritis    lower back, shoulders  . AVM (arteriovenous malformation) brain   . Cancer [facility])    prostate- treated with radiation  . CVA (cerebral infarction) 11/2015  . Dyspnea    with exertion  . HOH (hard of hearing)   . Hyperlipidemia   . Hypertension   . Pneumonia    1st grade  . Pre-diabetes   . Prediabetes   . Seizures ([place]) 12/2015  . Stroke [facility])    speech impairment, no weakness    SURGICAL HISTORY: Past Surgical History:  Procedure Laterality Date  . BACK SURGERY  1999   disectomy  . BACK SURGERY     2 surgery- injury  . COLONOSCOPY W/ POLYPECTOMY    . HAMMER TOE SURGERY Bilateral   . INGUINAL HERNIA REPAIR Right    per patient when he was in 1st grade  . IR GENERIC HISTORICAL  12/05/2015   IR RADIOLOGIST EVAL & MGMT 12/05/2015 [facility]  . IR GENERIC HISTORICAL  01/31/2016   IR RADIOLOGIST EVAL & MGMT 01/31/2016 [facility]  . IR GENERIC HISTORICAL  02/20/2016   IR ANGIO INTRA EXTRACRAN SEL COM CAROTID INNOMINATE UNI L MOD SED 02/20/2016 [name], MD [facility]  . IR GENERIC HISTORICAL  02/20/2016   IR 3D INDEPENDENT WKST 02/20/2016 [name], MD [facility]  . KNEE SURGERY Bilateral 2015  . RADIOLOGY WITH ANESTHESIA N/A 02/20/2016   Procedure: EMBOLIZATION;  Surgeon: [name], MD;  Location: [place];  Service: Radiology;  Laterality: N/A;  . TOE SURGERY Left 2017   TOE NAIL REMOVAL  . TOTAL KNEE ARTHROPLASTY Right 07/20/2017   Procedure: TOTAL KNEE ARTHROPLASTY;  Surgeon: Lovell Sheehan, MD;  Location: [facility];  Service:  Orthopedics;  Laterality: Right;    SOCIAL HISTORY: Social History   Socioeconomic History  . Marital status: Single    Spouse name: Not on file  . Number of children: Not on file  . Years of education: Not on file  . Highest education level: Not on file  Occupational History  . Occupation: retired [employer] operational officer  . Financial resource strain: Not on file  . Food insecurity:    Worry: Not on file    Inability: Not on file  . Transportation needs:    Medical: Not on file    Non-medical: Not on file  Tobacco Use  . Smoking status: Former Smoker    Years: 2.00  . Smokeless tobacco: Never Used  . Tobacco comment: quit age 23  Substance and Sexual Activity  . Alcohol use: No  . Drug use: No  . Sexual activity: Not Currently  Lifestyle  . Physical activity:    Days per week: Not on file    Minutes per session: Not on file  . Stress: Not on file  Relationships  . Social connections:    Talks on phone: Not on file    Gets together: Not on file    Attends religious service: Not on file    Active member of club or organization: Not on file    Attends meetings of clubs or organizations: Not on file    Relationship status: Not on file  . Intimate partner violence:    Fear of current or ex partner: Not on file    Emotionally abused: Not on file    Physically abused: Not on file    Forced sexual activity: Not on file  Other Topics Concern  . Not on file  Social History Narrative  . Not on file    FAMILY HISTORY: Family History  Problem Relation Age of Onset  . Heart failure Father   . Diabetes Father   . Hypertension Father   . Alzheimer's disease Father 26  . Other Mother 59       homicide  . Alcohol abuse Mother   . Pancreatic cancer Sister   . Alcohol abuse Sister   . Diabetes Brother     ALLERGIES:  is allergic to poison oak extract and poison ivy extract.  MEDICATIONS:  Current Outpatient Medications  Medication Sig Dispense Refill  .  acetaminophen (TYLENOL) 500 MG tablet Take 500 mg by mouth every 6 (six) hours as needed.    Marland Kitchen amLODipine (NORVASC) 10 MG tablet Take 10 mg by mouth daily.    Marland Kitchen levETIRAcetam (ROWEEPRA) 500 MG tablet Take 500 mg by mouth 2 (two) times daily.    Marland Kitchen losartan-hydrochlorothiazide (HYZAAR) 100-25 MG tablet Take 1 tablet by mouth daily.  1  . metoprolol succinate (TOPROL-XL) 100 MG 24 hr tablet Take by mouth.    . rosuvastatin (CRESTOR) 5 MG tablet     . spironolactone (ALDACTONE) 100 MG tablet Take 100 mg by mouth 2 (two) times daily.    Marland Kitchen  apalutamide (ERLEADA) 60 MG tablet Take 4 tablets (240 mg total) by mouth daily. May be taken with or without food. Swallow tablets whole. (Patient not taking: Reported on 12/16/2017) 120 tablet 11  . aspirin EC 325 MG EC tablet Take 1 tablet (325 mg total) by mouth daily with breakfast. (Patient not taking: Reported on 12/16/2017) 30 tablet 0  . calcium carbonate (OSCAL) 1500 (600 Ca) MG TABS tablet Take 600 mg of elemental calcium by mouth 2 (two) times daily with a meal.    . cholecalciferol (VITAMIN D) 1000 units tablet Take 1,000 Units by mouth daily.    Marland Kitchen docusate sodium (COLACE) 100 MG capsule Take 1 capsule (100 mg total) by mouth 2 (two) times daily. (Patient not taking: Reported on 12/16/2017) 20 capsule 0  . oxyCODONE (OXY IR/ROXICODONE) 5 MG immediate release tablet Take 1-2 tablets (5-10 mg total) by mouth every 6 (six) hours as needed for moderate pain (pain score 4-6). (Patient not taking: Reported on 12/16/2017) 30 tablet 0  . topiramate (TOPAMAX) 50 MG tablet Take 1 tablet (50 mg total) by mouth 2 (two) times daily. Start one tablet daily x 1 week then twice daily (Patient not taking: Reported on 12/16/2017) 180 tablet 0   No current facility-administered medications for this visit.      PHYSICAL EXAMINATION: ECOG PERFORMANCE STATUS: 1 - Symptomatic but completely ambulatory Vitals:   12/16/17 1118  BP: 110/69  Pulse: (!) 52  Resp: 18  Temp: (!)  96.9 F (36.1 C)   Filed Weights   12/16/17 1118  Weight: 290 lb 8 oz (131.8 kg)    Physical Exam  Constitutional: He is oriented to person, place, and time. He appears well-developed and well-nourished. No distress.  HENT:  Head: Normocephalic and atraumatic.  Right Ear: External ear normal.  Left Ear: External ear normal.  Mouth/Throat: Oropharynx is clear and moist.  Eyes: Pupils are equal, round, and reactive to light. EOM are normal. No scleral icterus.  Neck: Normal range of motion. Neck supple.  Cardiovascular: Normal rate, regular rhythm and normal heart sounds.  Pulmonary/Chest: Effort normal. No respiratory distress. He has no wheezes.  Abdominal: Soft. Bowel sounds are normal. He exhibits no distension and no mass. There is no tenderness.  Musculoskeletal: Normal range of motion. He exhibits no edema or deformity.  Neurological: He is alert and oriented to person, place, and time. No cranial nerve deficit. Coordination normal.  Skin: Skin is warm and dry. No rash noted. No erythema.  Psychiatric: He has a normal mood and affect. His behavior is normal. Thought content normal.     LABORATORY DATA:  I have reviewed the data as listed Lab Results  Component Value Date   WBC 5.3 12/16/2017   HGB 15.4 12/16/2017   HCT 45.5 12/16/2017   MCV 92.3 12/16/2017   PLT 212 12/16/2017   Recent Labs    07/08/17 1338 07/21/17 0335 12/16/17 1226  NA 138 135 140  K 4.3 4.0 4.6  CL 99* 101 102  CO2 29 23 26   GLUCOSE 108* 160* 121*  BUN 23* 30* 28*  CREATININE 1.54* 1.60* 1.37*  CALCIUM 10.2 8.8* 9.6  GFRNONAA 44* 42* 50*  GFRAA 51* 48* 58*  PROT  --   --  8.0  ALBUMIN  --   --  4.3  AST  --   --  28  ALT  --   --  32  ALKPHOS  --   --  38  BILITOT  --   --  0.5      ASSESSMENT & PLAN:  1. Prostate cancer ([place])   2. Rising PSA level   3. Seizure [facility])    Discussed with patient that most likely he is having biochemical recurrence. Image study 32-month ago  showed no evidence of metastatic disease. I will obtain CT chest abdomen pelvis without contrast and bone scan for further evaluation. 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   Dr.Stoioff has prescribed Apalutimide which is FDA approved for non metastatic CRPC. Last Lupron shot 22.5mg  was in July 2019.  Patient reports that he has not started taken Apalutimide yet.  Patient has a history of seizure and currently on antiseizure medications.  Apalutamide has seizure side effects and is not clear whether antiseizure medication can prevent apalutamide related seizure activity or not.  In clinical study, patient with seizure disorder were excluded from the study.   I recommend patient complete image work-up and then came back to the clinic so we can have a discussion about his condition and finalize his treatment plan. Orders Placed This Encounter  Procedures  . CT Chest Wo Contrast    Standing Status:   Future    Standing Expiration Date:   12/16/2018    Order Specific Question:   Preferred imaging location?    Answer:   [facility]    Order Specific Question:   Radiology Contrast Protocol - do NOT remove file path    Answer:   \\[email or web address].[email or web address]  . CT Abdomen Pelvis Wo Contrast    Standing Status:   Future    Standing Expiration Date:   12/16/2018    Order Specific Question:   Preferred imaging location?    Answer:   [facility]    Order Specific Question:   Is Oral Contrast requested for this exam?    Answer:   Yes, Per Radiology protocol    Order Specific Question:   Radiology Contrast Protocol - do NOT remove file path    Answer:   \\[email or web address]\CTProtocols.pdf  . NM Bone Scan Whole Body    Standing Status:   Future    Standing Expiration Date:   12/16/2018    Order Specific Question:   If indicated for the ordered procedure, I authorize the administration of a radiopharmaceutical per Radiology protocol      Answer:   Yes    Order Specific Question:   Preferred imaging location?    Answer:   [facility]    Order Specific Question:   Radiology Contrast Protocol - do NOT remove file path    Answer:   \\[email or web address]\[email or web address]  . CBC with Differential/Platelet    Standing Status:   Future    Number of Occurrences:   1    Standing Expiration Date:   12/17/2018  . Comprehensive metabolic panel    Standing Status:   Future    Number of Occurrences:   1    Standing Expiration Date:   12/17/2018  . PSA    Standing Status:   Future    Number of Occurrences:   1    Standing Expiration Date:   12/17/2018    All questions were answered. The patient knows to call the clinic with any problems questions or concerns.  Return of visit: After bone scan and CT. Thank you for this kind referral and the opportunity to participate in the care of this patient. A copy of today's note is routed to referring provider  Total face to  face encounter time for this patient visit was 60 min. >50% of the time was  spent in counseling and coordination of care.    Earlie Server, MD, PhD Hematology Oncology [facility] Pager- [phone] 12/16/2017

## 2017-12-17 ENCOUNTER — Telehealth: Payer: Self-pay | Admitting: Urology

## 2017-12-17 NOTE — Telephone Encounter (Signed)
[place] called office to follow up on medication change, called a few weeks ago about Timothy Maddox was stopped due to pt having seizures, stated that another medication was supposed to be sent in, pharmacy states they still have not received a new Rx for the patient. Please advise [place] at [phone] or fax [phone]. Thank you.

## 2017-12-17 NOTE — Telephone Encounter (Signed)
No replacement at this time. He was sent to cancer center for further treatment. Pt informed and so was pharmacy.

## 2017-12-23 ENCOUNTER — Encounter
Admission: RE | Admit: 2017-12-23 | Discharge: 2017-12-23 | Disposition: A | Discharge status: Home / Self Care | Payer: Medicare HMO | Source: Ambulatory Visit | Attending: Oncology | Admitting: Oncology

## 2017-12-23 ENCOUNTER — Ambulatory Visit: Payer: Medicare HMO

## 2017-12-23 ENCOUNTER — Ambulatory Visit
Admission: RE | Admit: 2017-12-23 | Discharge: 2017-12-23 | Disposition: A | Discharge status: Home / Self Care | Payer: Medicare HMO | Source: Ambulatory Visit | Attending: Oncology | Admitting: Oncology

## 2017-12-23 DIAGNOSIS — C61 Malignant neoplasm of prostate: Secondary | ICD-10-CM | POA: Diagnosis not present

## 2017-12-23 MED ORDER — TECHNETIUM TC 99M MEDRONATE IV KIT
23.9600 | PACK | Freq: Once | INTRAVENOUS | Status: AC | PRN
  Administered 2017-12-23: 23.96 via INTRAVENOUS

## 2017-12-28 ENCOUNTER — Encounter: Payer: Self-pay | Admitting: Oncology

## 2017-12-28 ENCOUNTER — Other Ambulatory Visit: Payer: Self-pay

## 2017-12-28 ENCOUNTER — Inpatient Hospital Stay (HOSPITAL_BASED_OUTPATIENT_CLINIC_OR_DEPARTMENT_OTHER): Payer: Medicare HMO | Admitting: Oncology

## 2017-12-28 VITALS — BP 127/76 | HR 48 | Temp 96.0°F | Resp 18 | Wt 285.8 lb

## 2017-12-28 DIAGNOSIS — Z87891 Personal history of nicotine dependence: Secondary | ICD-10-CM

## 2017-12-28 DIAGNOSIS — I10 Essential (primary) hypertension: Secondary | ICD-10-CM | POA: Diagnosis not present

## 2017-12-28 DIAGNOSIS — R569 Unspecified convulsions: Secondary | ICD-10-CM

## 2017-12-28 DIAGNOSIS — R972 Elevated prostate specific antigen [PSA]: Secondary | ICD-10-CM

## 2017-12-28 DIAGNOSIS — C61 Malignant neoplasm of prostate: Secondary | ICD-10-CM | POA: Diagnosis not present

## 2017-12-28 NOTE — Progress Notes (Addendum)
Hematology/Oncology Consult note [facility] [phone] [phone]   Patient Care Team: [name], MD as PCP - General (Internal Medicine)  REFERRING PROVIDER: Dr.Stoioff  CHIEF COMPLAINTS/REASON FOR VISIT:  Evaluation of prostate cancer  HISTORY OF PRESENTING ILLNESS:  ANTWOIN LACKEY is a  72 y.o.  male with PMH listed below who was referred to me for evaluation of prostate cancer.  Prostate cancer diagnosis dated back to 2013.  I do  Not have his previous oncology medical records. Per Dr.Stoioff, he completed IMRT in February 2014 followed by androgen deprivation therapy. Patient previously followed Dr. Marian Sorrow at [facility] urology and then [place] in [place].  Recently he was seen by Dr. John Giovanni at [facility] urology.  Per urology notes, patient was diagnosed with a Gleason score 5+4 with 100% involvement in 6 out of 6 cores on the left and a 3 out of 6 cores on the right.  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  Bone scan was normal at that time.  Patient was treated with Lupron for about 18 months. PSA starts to rise.  August 2018 PSA was 9.3.  Patient had a CT and bone scan done was negative for metastatic disease.  Testosterone level was 53 in August 2018.  Patient was restarted on Lupron injection every 3 months since then.  December 2018 PSA decreased to 4.9 after ADT restarted. March 2019 PSA 5.4 Testosterone was checked in June 2019 within castration level.  Patient reports chronic back pain.  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  History of seizure, takes Roweepra 500 twice daily, Topiramate 5mg  BID. Marland Kitchen  Daughter accompanies patient to today's visit.  INTERVAL HISTORY DAMAIN BROADUS is a 72 y.o. 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  CT chest was not approved by insurance.  Patient continued to be asymptomatic except chronic back pain.  No new complaints.   Review of Systems  Constitutional: Negative for malaise/fatigue and weight loss.  HENT: Negative for hearing loss.   Eyes: Negative for photophobia and pain.  Respiratory: Negative for cough and sputum production.   Cardiovascular: Negative for palpitations and orthopnea.  Gastrointestinal: Negative for nausea and vomiting.  Genitourinary: Negative for dysuria.  Musculoskeletal: Positive for back pain.  Skin: Negative for rash.  Neurological: Negative for dizziness.  Endo/Heme/Allergies: Does not bruise/bleed easily.  Psychiatric/Behavioral: Negative for depression.    MEDICAL HISTORY:  Past Medical History:  Diagnosis Date  . Arthritis    lower back, shoulders  . AVM (arteriovenous malformation) brain   . Cancer [facility])    prostate- treated with radiation  . CVA (cerebral infarction) 11/2015  . Dyspnea    with exertion  . HOH (hard of hearing)   . Hyperlipidemia   . Hypertension   . Pneumonia    1st grade  . Pre-diabetes   . Prediabetes   . Seizures ([place]) 12/2015  . Stroke [facility])    speech impairment, no weakness    SURGICAL HISTORY: Past Surgical History:  Procedure Laterality Date  . BACK SURGERY  1999   disectomy  . BACK SURGERY     2 surgery- injury  . COLONOSCOPY W/ POLYPECTOMY    . HAMMER TOE SURGERY Bilateral   . INGUINAL HERNIA REPAIR Right    per patient when he  was in 1st grade  . IR GENERIC HISTORICAL  12/05/2015   IR RADIOLOGIST EVAL & MGMT 12/05/2015 [facility]  . IR GENERIC HISTORICAL  01/31/2016   IR RADIOLOGIST EVAL & MGMT 01/31/2016 [facility]  . IR GENERIC HISTORICAL  02/20/2016   IR ANGIO INTRA EXTRACRAN SEL COM CAROTID INNOMINATE UNI L MOD SED 02/20/2016 [name], MD [facility]  . IR GENERIC HISTORICAL  02/20/2016   IR 3D INDEPENDENT WKST 02/20/2016 [name], MD [facility]  . KNEE SURGERY Bilateral 2015  . RADIOLOGY WITH ANESTHESIA N/A 02/20/2016   Procedure: EMBOLIZATION;  Surgeon: [name], MD;  Location: [place];  Service: Radiology;  Laterality: N/A;  . TOE SURGERY Left 2017   TOE NAIL REMOVAL  . TOTAL KNEE ARTHROPLASTY Right 07/20/2017   Procedure: TOTAL KNEE ARTHROPLASTY;  Surgeon: Lovell Sheehan, MD;  Location: [facility];  Service: Orthopedics;  Laterality: Right;    SOCIAL HISTORY: Social History   Socioeconomic History  . Marital status: Single    Spouse name: Not on file  . Number of children: Not on file  . Years of education: Not on file  . Highest education level: Not on file  Occupational History  . Occupation: retired [employer] operational officer  . Financial resource strain: Not on file  . Food insecurity:    Worry: Not on file    Inability: Not on file  . Transportation needs:    Medical: Not on file    Non-medical: Not on file  Tobacco Use  . Smoking status: Former Smoker    Years: 2.00  . Smokeless tobacco: Never Used  . Tobacco comment: quit age 39  Substance and Sexual Activity  . Alcohol use: No  . Drug use: No  . Sexual activity: Not Currently  Lifestyle  . Physical activity:    Days per week: Not on file    Minutes per session: Not on file  . Stress: Not on file  Relationships  . Social connections:    Talks on phone: Not on file    Gets together: Not on file    Attends religious service: Not on file    Active member of club or organization: Not on file    Attends meetings of clubs or organizations: Not on file    Relationship status: Not on file  . Intimate partner violence:    Fear of current or ex partner: Not on file    Emotionally abused: Not on file    Physically abused: Not on file    Forced sexual activity: Not on file  Other Topics Concern  . Not on file  Social History Narrative  . Not on file    FAMILY HISTORY: Family History  Problem Relation Age of Onset  .  Heart failure Father   . Diabetes Father   . Hypertension Father   . Alzheimer's disease Father 64  . Other Mother 44       homicide  . Alcohol abuse Mother   . Pancreatic cancer Sister   . Alcohol abuse Sister   . Diabetes Brother     ALLERGIES:  is allergic to poison oak extract and poison ivy extract.  MEDICATIONS:  Current Outpatient Medications  Medication Sig Dispense Refill  . acetaminophen (TYLENOL) 500 MG tablet Take 500 mg by mouth every 6 (six) hours as needed.    Marland Kitchen amLODipine (NORVASC) 10 MG tablet Take 10 mg by mouth daily.    . calcium carbonate (OSCAL) 1500 (  600 Ca) MG TABS tablet Take 600 mg of elemental calcium by mouth 2 (two) times daily with a meal.    . cholecalciferol (VITAMIN D) 1000 units tablet Take 1,000 Units by mouth daily.    Marland Kitchen levETIRAcetam (ROWEEPRA) 500 MG tablet Take 500 mg by mouth 2 (two) times daily.    Marland Kitchen losartan-hydrochlorothiazide (HYZAAR) 100-25 MG tablet Take 1 tablet by mouth daily.  1  . metoprolol succinate (TOPROL-XL) 100 MG 24 hr tablet Take by mouth.    . rosuvastatin (CRESTOR) 5 MG tablet     . spironolactone (ALDACTONE) 100 MG tablet Take 100 mg by mouth 2 (two) times daily.    Marland Kitchen apalutamide (ERLEADA) 60 MG tablet Take 4 tablets (240 mg total) by mouth daily. May be taken with or without food. Swallow tablets whole. (Patient not taking: Reported on 12/16/2017) 120 tablet 11  . aspirin EC 325 MG EC tablet Take 1 tablet (325 mg total) by mouth daily with breakfast. (Patient not taking: Reported on 12/16/2017) 30 tablet 0  . docusate sodium (COLACE) 100 MG capsule Take 1 capsule (100 mg total) by mouth 2 (two) times daily. (Patient not taking: Reported on 12/16/2017) 20 capsule 0  . oxyCODONE (OXY IR/ROXICODONE) 5 MG immediate release tablet Take 1-2 tablets (5-10 mg total) by mouth every 6 (six) hours as needed for moderate pain (pain score 4-6). (Patient not taking: Reported on 12/16/2017) 30 tablet 0  . topiramate (TOPAMAX) 50 MG tablet Take  1 tablet (50 mg total) by mouth 2 (two) times daily. Start one tablet daily x 1 week then twice daily (Patient not taking: Reported on 12/16/2017) 180 tablet 0   No current facility-administered medications for this visit.      PHYSICAL EXAMINATION: ECOG PERFORMANCE STATUS: 1 - Symptomatic but completely ambulatory Vitals:   12/28/17 0950  BP: 127/76  Pulse: (!) 48  Resp: 18  Temp: (!) 96 F (35.6 C)   Filed Weights   12/28/17 0950  Weight: 285 lb 12.8 oz (129.6 kg)    Physical Exam  Constitutional: He is oriented to person, place, and time. He appears well-developed and well-nourished. No distress.  HENT:  Head: Normocephalic and atraumatic.  Mouth/Throat: Oropharynx is clear and moist.  Eyes: Pupils are equal, round, and reactive to light. EOM are normal. No scleral icterus.  Neck: Normal range of motion. Neck supple.  Cardiovascular: Normal rate, regular rhythm and normal heart sounds.  Pulmonary/Chest: Effort normal. No respiratory distress. He has no wheezes.  Abdominal: Soft. Bowel sounds are normal. He exhibits no distension and no mass. There is no tenderness.  Musculoskeletal: Normal range of motion. He exhibits no edema or deformity.  Neurological: He is alert and oriented to person, place, and time. No cranial nerve deficit. Coordination normal.  Skin: Skin is warm and dry. No rash noted. No erythema.  Psychiatric: He has a normal mood and affect. His behavior is normal. Thought content normal.     LABORATORY DATA:  I have reviewed the data as listed Lab Results  Component Value Date   WBC 5.3 12/16/2017   HGB 15.4 12/16/2017   HCT 45.5 12/16/2017   MCV 92.3 12/16/2017   PLT 212 12/16/2017   Recent Labs    07/08/17 1338 07/21/17 0335 12/16/17 1226  NA 138 135 140  K 4.3 4.0 4.6  CL 99* 101 102  CO2 29 23 26   GLUCOSE 108* 160* 121*  BUN 23* 30* 28*  CREATININE 1.54* 1.60* 1.37*  CALCIUM 10.2  8.8* 9.6  GFRNONAA 44* 42* 50*  GFRAA 51* 48* 58*    PROT  --   --  8.0  ALBUMIN  --   --  4.3  AST  --   --  28  ALT  --   --  32  ALKPHOS  --   --  38  BILITOT  --   --  0.5      ASSESSMENT & PLAN:  1. Prostate cancer ([place])   2. Rising PSA level   3. Seizure ([place])    #CT image and bone scan were independently reviewed reviewed and discussed with patient. Bone scan showed uptake in the right knee surrounding prosthesis, degenerative type uptake at the shoulders, sternoclavicular joints, lumbar spine left knee and feet.  Also uptake in the mid sternum.  No definitive worrisome of sites of abnormal osseous tracer accumulation suggesting osseous metastatic disease. CT abdomen pelvis without contrast showed brachy therapy seeds in the prostate, no findings suspicious for metastatic disease.  Discussed with patient that he has known metastatic castration resistant prostate cancer. Recommend continue ADT .\  Apalutamide or enzalutamide are good options for nonmetastatic castration resistant prostate cancer.  However both medications were associated with about less than 1% chance of seizure.  Patient has a history of seizure disorder and he is extremely concerned about the potential risk of increasing risk of seizure. It is not clear whether antiseizure medication can prevent apalutamide/Enzalutamide related seizure activity or not.  In clinical study, patient with seizure disorder were excluded from the study.  For now, his PSA doubling time is about 21 months, > 10 months, observation is acceptable. However, it is inevitable that he will become metastatic and needs treatment in the future, and it remains a concern if he ran out of other options, but Enzalutaminde, eventually.  I recommend patient to follow up with neurology to see if it is safe to take treatment with close monitoring.  Aand he needs to quit driving.   Patient is not excited about radical prostatectomy. I will touch base with urology to see if that will be even offered  anyway.   Orders Placed This Encounter  Procedures  . CBC with Differential/Platelet    Standing Status:   Future    Standing Expiration Date:   12/29/2018  . Comprehensive metabolic panel    Standing Status:   Future    Standing Expiration Date:   12/29/2018  . Testosterone    Standing Status:   Future    Standing Expiration Date:   12/29/2018  . PSA    Standing Status:   Future    Standing Expiration Date:   12/29/2018    All questions were answered. The patient knows to call the clinic with any problems questions or concerns.  Return of visit: 4 weeks Total face to face encounter time for this patient visit was 25 min. >50% of the time was  spent in counseling and coordination of care.   Earlie Server, MD, PhD Hematology Oncology [facility] at [facility] Pager- [phone] 12/28/2017

## 2017-12-28 NOTE — Progress Notes (Signed)
Patient here for follow up. No concerns voiced.  °

## 2017-12-31 ENCOUNTER — Encounter: Payer: Self-pay | Admitting: Oncology

## 2017-12-31 DIAGNOSIS — Z7189 Other specified counseling: Secondary | ICD-10-CM | POA: Insufficient documentation

## 2018-01-11 ENCOUNTER — Other Ambulatory Visit: Payer: Medicare HMO

## 2018-01-11 ENCOUNTER — Ambulatory Visit: Payer: Medicare HMO | Admitting: Oncology

## 2018-01-25 ENCOUNTER — Ambulatory Visit: Payer: Medicare HMO | Admitting: Oncology

## 2018-01-25 ENCOUNTER — Other Ambulatory Visit: Payer: Medicare HMO

## 2018-01-26 ENCOUNTER — Ambulatory Visit: Payer: Medicare HMO | Admitting: Neurology

## 2018-01-27 ENCOUNTER — Inpatient Hospital Stay (HOSPITAL_BASED_OUTPATIENT_CLINIC_OR_DEPARTMENT_OTHER): Payer: Medicare HMO | Admitting: Oncology

## 2018-01-27 ENCOUNTER — Inpatient Hospital Stay: Payer: Medicare HMO | Attending: Oncology

## 2018-01-27 ENCOUNTER — Other Ambulatory Visit: Payer: Self-pay

## 2018-01-27 ENCOUNTER — Encounter: Payer: Self-pay | Admitting: Oncology

## 2018-01-27 VITALS — BP 139/82 | HR 61 | Temp 96.4°F | Resp 18 | Wt 293.8 lb

## 2018-01-27 DIAGNOSIS — G8929 Other chronic pain: Secondary | ICD-10-CM | POA: Insufficient documentation

## 2018-01-27 DIAGNOSIS — R569 Unspecified convulsions: Secondary | ICD-10-CM | POA: Diagnosis not present

## 2018-01-27 DIAGNOSIS — C61 Malignant neoplasm of prostate: Secondary | ICD-10-CM

## 2018-01-27 DIAGNOSIS — I10 Essential (primary) hypertension: Secondary | ICD-10-CM | POA: Insufficient documentation

## 2018-01-27 DIAGNOSIS — M549 Dorsalgia, unspecified: Secondary | ICD-10-CM | POA: Insufficient documentation

## 2018-01-27 DIAGNOSIS — Z87891 Personal history of nicotine dependence: Secondary | ICD-10-CM | POA: Diagnosis not present

## 2018-01-27 DIAGNOSIS — Z79899 Other long term (current) drug therapy: Secondary | ICD-10-CM | POA: Diagnosis not present

## 2018-01-27 DIAGNOSIS — Z8673 Personal history of transient ischemic attack (TIA), and cerebral infarction without residual deficits: Secondary | ICD-10-CM | POA: Diagnosis not present

## 2018-01-27 DIAGNOSIS — R972 Elevated prostate specific antigen [PSA]: Secondary | ICD-10-CM

## 2018-01-27 LAB — CBC WITH DIFFERENTIAL/PLATELET
Basophils Absolute: 0 10*3/uL (ref 0–0.1)
Basophils Relative: 1 %
Eosinophils Absolute: 0.2 10*3/uL (ref 0–0.7)
Eosinophils Relative: 3 %
HEMATOCRIT: 43 % (ref 40.0–52.0)
HEMOGLOBIN: 15 g/dL (ref 13.0–18.0)
Lymphocytes Relative: 23 %
Lymphs Abs: 1.3 10*3/uL (ref 1.0–3.6)
MCH: 31.6 pg (ref 26.0–34.0)
MCHC: 34.9 g/dL (ref 32.0–36.0)
MCV: 90.4 fL (ref 80.0–100.0)
MONOS PCT: 15 %
Monocytes Absolute: 0.8 10*3/uL (ref 0.2–1.0)
NEUTROS ABS: 3.2 10*3/uL (ref 1.4–6.5)
NEUTROS PCT: 58 %
Platelets: 193 10*3/uL (ref 150–440)
RBC: 4.76 MIL/uL (ref 4.40–5.90)
RDW: 13.5 % (ref 11.5–14.5)
WBC: 5.5 10*3/uL (ref 3.8–10.6)

## 2018-01-27 LAB — COMPREHENSIVE METABOLIC PANEL
ALBUMIN: 3.9 g/dL (ref 3.5–5.0)
ALT: 35 U/L (ref 0–44)
ANION GAP: 9 (ref 5–15)
AST: 31 U/L (ref 15–41)
Alkaline Phosphatase: 37 U/L — ABNORMAL LOW (ref 38–126)
BUN: 21 mg/dL (ref 8–23)
CO2: 27 mmol/L (ref 22–32)
CREATININE: 1.61 mg/dL — AB (ref 0.61–1.24)
Calcium: 9.5 mg/dL (ref 8.9–10.3)
Chloride: 101 mmol/L (ref 98–111)
GFR calc non Af Amer: 41 mL/min — ABNORMAL LOW (ref >60)
GFR, EST AFRICAN AMERICAN: 48 mL/min — AB (ref >60)
Glucose, Bld: 120 mg/dL — ABNORMAL HIGH (ref 70–99)
Potassium: 4.2 mmol/L (ref 3.5–5.1)
Sodium: 137 mmol/L (ref 135–145)
Total Bilirubin: 0.4 mg/dL (ref 0.3–1.2)
Total Protein: 7.3 g/dL (ref 6.5–8.1)

## 2018-01-27 LAB — PSA: PROSTATIC SPECIFIC ANTIGEN: 6.97 ng/mL — AB (ref 0.00–4.00)

## 2018-01-27 NOTE — Progress Notes (Signed)
Patient here for follow up. No concerns voiced.  °

## 2018-01-27 NOTE — Progress Notes (Signed)
Hematology/Oncology follow up note [facility] [phone] [phone]   Patient Care Team: [name], MD as PCP - General (Internal Medicine)  REFERRING PROVIDER: Dr.Stoioff  CHIEF COMPLAINTS/REASON FOR VISIT:  Evaluation of prostate cancer  HISTORY OF PRESENTING ILLNESS:  Timothy Maddox is a  72 y.o.  male with PMH listed below who was referred to me for evaluation of prostate cancer.  Prostate cancer diagnosis dated back to 2013.  I do  Not have his previous oncology medical records. Per Dr.Stoioff, he completed IMRT in February 2014 followed by androgen deprivation therapy. Patient previously followed Dr. Marian Sorrow at [facility] urology and then [facility] in [place].  Recently he was seen by Dr. John Giovanni at [facility] urology.  Per urology notes, patient was diagnosed with a Gleason score 5+4 with 100% involvement in 6 out of 6 cores on the left and a 3 out of 6 cores on the right.  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  Bone scan was normal at that time.   Patient was treated with Lupron for about 18 months. PSA starts to rise.  August 2018 PSA was 9.3.  Patient had a CT and bone scan done was negative for metastatic disease.  Testosterone level was 53 in August 2018.  Patient was restarted on Lupron injection every 3 months since then.  December 2018 PSA decreased to 4.9 after ADT restarted. March 2019 PSA 5.4 Testosterone was checked in June 2019 within castration level.  Patient reports chronic back pain.  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  History of seizure, takes Roweepra 500 twice daily, Topiramate 5mg  BID. Marland Kitchen  Daughter accompanies patient to today's visit.  INTERVAL HISTORY Timothy Maddox is a 72 y.o. 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  CT scan  and bone scan showed no signs of metastatic prostate cancer.  Apalutimid was indicated, however, has risk of causing seizure. Patient has a history of seizure and takes anticonvulsants.  He was advised to see neurology to discuss chance of seizure if he would need to be started on this medication. Another treatment option in the future is Gillermina Phy which also can cause seizure.  Patient has an appointment this week, however he cancelled it as he changes his mind and is interested in surgical options of local treatment.  Daughter accompanied patient to clinic.  Patient continues to be asymptomatic except chronic back pain. No new complaints.    Review of Systems  Constitutional: Negative for chills, fever, malaise/fatigue and weight loss.  HENT: Negative for hearing loss, nosebleeds and sore throat.   Eyes: Negative for double vision, photophobia, pain and redness.  Respiratory: Negative for cough, sputum production, shortness of breath and wheezing.   Cardiovascular: Negative for chest pain, palpitations and orthopnea.  Gastrointestinal: Negative for abdominal pain, blood in stool, nausea and vomiting.  Genitourinary: Negative for dysuria.  Musculoskeletal: Positive for back pain. Negative for myalgias and neck pain.  Skin: Negative for itching and rash.  Neurological: Negative for dizziness, tingling and tremors.  Endo/Heme/Allergies: Negative for environmental allergies. Does not bruise/bleed easily.  Psychiatric/Behavioral: Negative for depression.    MEDICAL HISTORY:  Past Medical History:  Diagnosis Date  . Arthritis    lower back, shoulders  . AVM (arteriovenous malformation) brain   . Cancer [facility])    prostate- treated with radiation  . CVA (cerebral infarction) 11/2015  .  Dyspnea    with exertion  . HOH (hard of hearing)   . Hyperlipidemia   . Hypertension   . Pneumonia    1st grade  . Pre-diabetes   . Prediabetes   . Seizures ([place]) 12/2015  . Stroke [facility])    speech  impairment, no weakness    SURGICAL HISTORY: Past Surgical History:  Procedure Laterality Date  . BACK SURGERY  1999   disectomy  . BACK SURGERY     2 surgery- injury  . COLONOSCOPY W/ POLYPECTOMY    . HAMMER TOE SURGERY Bilateral   . INGUINAL HERNIA REPAIR Right    per patient when he was in 1st grade  . IR GENERIC HISTORICAL  12/05/2015   IR RADIOLOGIST EVAL & MGMT 12/05/2015 [facility]  . IR GENERIC HISTORICAL  01/31/2016   IR RADIOLOGIST EVAL & MGMT 01/31/2016 [facility]  . IR GENERIC HISTORICAL  02/20/2016   IR ANGIO INTRA EXTRACRAN SEL COM CAROTID INNOMINATE UNI L MOD SED 02/20/2016 [name], MD [facility]  . IR GENERIC HISTORICAL  02/20/2016   IR 3D INDEPENDENT WKST 02/20/2016 [name], MD [facility]  . KNEE SURGERY Bilateral 2015  . RADIOLOGY WITH ANESTHESIA N/A 02/20/2016   Procedure: EMBOLIZATION;  Surgeon: [name], MD;  Location: [place];  Service: Radiology;  Laterality: N/A;  . TOE SURGERY Left 2017   TOE NAIL REMOVAL  . TOTAL KNEE ARTHROPLASTY Right 07/20/2017   Procedure: TOTAL KNEE ARTHROPLASTY;  Surgeon: Lovell Sheehan, MD;  Location: [facility];  Service: Orthopedics;  Laterality: Right;    SOCIAL HISTORY: Social History   Socioeconomic History  . Marital status: Single    Spouse name: Not on file  . Number of children: Not on file  . Years of education: Not on file  . Highest education level: Not on file  Occupational History  . Occupation: retired [employer] operational officer  . Financial resource strain: Not on file  . Food insecurity:    Worry: Not on file    Inability: Not on file  . Transportation needs:    Medical: Not on file    Non-medical: Not on file  Tobacco Use  . Smoking status: Former Smoker    Years: 2.00  . Smokeless tobacco: Never Used  . Tobacco comment: quit age 72  Substance and Sexual Activity  . Alcohol use: No  . Drug use: No  . Sexual activity: Not Currently  Lifestyle  . Physical activity:      Days per week: Not on file    Minutes per session: Not on file  . Stress: Not on file  Relationships  . Social connections:    Talks on phone: Not on file    Gets together: Not on file    Attends religious service: Not on file    Active member of club or organization: Not on file    Attends meetings of clubs or organizations: Not on file    Relationship status: Not on file  . Intimate partner violence:    Fear of current or ex partner: Not on file    Emotionally abused: Not on file    Physically abused: Not on file    Forced sexual activity: Not on file  Other Topics Concern  . Not on file  Social History Narrative  . Not on file    FAMILY HISTORY: Family History  Problem Relation Age of Onset  . Heart failure Father   . Diabetes Father   .  Hypertension Father   . Alzheimer's disease Father 43  . Other Mother 62       homicide  . Alcohol abuse Mother   . Pancreatic cancer Sister   . Alcohol abuse Sister   . Diabetes Brother     ALLERGIES:  is allergic to poison oak extract and poison ivy extract.  MEDICATIONS:  Current Outpatient Medications  Medication Sig Dispense Refill  . acetaminophen (TYLENOL) 500 MG tablet Take 500 mg by mouth every 6 (six) hours as needed.    Marland Kitchen amLODipine (NORVASC) 10 MG tablet Take 10 mg by mouth daily.    Marland Kitchen levETIRAcetam (ROWEEPRA) 500 MG tablet Take 500 mg by mouth 2 (two) times daily.    Marland Kitchen losartan-hydrochlorothiazide (HYZAAR) 100-25 MG tablet Take 1 tablet by mouth daily.  1  . metoprolol succinate (TOPROL-XL) 100 MG 24 hr tablet Take by mouth.    . rosuvastatin (CRESTOR) 5 MG tablet     . spironolactone (ALDACTONE) 100 MG tablet Take 100 mg by mouth 2 (two) times daily.    Marland Kitchen apalutamide (ERLEADA) 60 MG tablet Take 4 tablets (240 mg total) by mouth daily. May be taken with or without food. Swallow tablets whole. (Patient not taking: Reported on 12/16/2017) 120 tablet 11  . aspirin EC 325 MG EC tablet Take 1 tablet (325 mg total) by  mouth daily with breakfast. (Patient not taking: Reported on 12/16/2017) 30 tablet 0  . calcium carbonate (OSCAL) 1500 (600 Ca) MG TABS tablet Take 600 mg of elemental calcium by mouth 2 (two) times daily with a meal.    . cholecalciferol (VITAMIN D) 1000 units tablet Take 1,000 Units by mouth daily.    Marland Kitchen docusate sodium (COLACE) 100 MG capsule Take 1 capsule (100 mg total) by mouth 2 (two) times daily. (Patient not taking: Reported on 01/27/2018) 20 capsule 0  . oxyCODONE (OXY IR/ROXICODONE) 5 MG immediate release tablet Take 1-2 tablets (5-10 mg total) by mouth every 6 (six) hours as needed for moderate pain (pain score 4-6). (Patient not taking: Reported on 12/16/2017) 30 tablet 0  . topiramate (TOPAMAX) 50 MG tablet Take 1 tablet (50 mg total) by mouth 2 (two) times daily. Start one tablet daily x 1 week then twice daily (Patient not taking: Reported on 12/16/2017) 180 tablet 0   No current facility-administered medications for this visit.      PHYSICAL EXAMINATION: ECOG PERFORMANCE STATUS: 1 - Symptomatic but completely ambulatory Vitals:   01/27/18 1329 01/27/18 1330  BP:  139/82  Pulse:  61  Resp:  18  Temp: (!) 96.4 F (35.8 C)    Filed Weights   01/27/18 1329  Weight: 293 lb 12.8 oz (133.3 kg)    Physical Exam  Constitutional: He is oriented to person, place, and time. No distress.  HENT:  Head: Normocephalic and atraumatic.  Mouth/Throat: Oropharynx is clear and moist.  Eyes: Pupils are equal, round, and reactive to light. EOM are normal. No scleral icterus.  Neck: Normal range of motion. Neck supple.  Cardiovascular: Normal rate, regular rhythm and normal heart sounds.  Pulmonary/Chest: Effort normal. No respiratory distress. He has no wheezes.  Abdominal: Soft. Bowel sounds are normal. He exhibits no distension and no mass. There is no tenderness.  Musculoskeletal: Normal range of motion. He exhibits no edema or deformity.  Neurological: He is alert and oriented to  person, place, and time. No cranial nerve deficit. Coordination normal.  Skin: Skin is warm and dry. No rash noted. No erythema.  Psychiatric: He has a normal mood and affect. His behavior is normal. Thought content normal.     LABORATORY DATA:  I have reviewed the data as listed Lab Results  Component Value Date   WBC 5.5 01/27/2018   HGB 15.0 01/27/2018   HCT 43.0 01/27/2018   MCV 90.4 01/27/2018   PLT 193 01/27/2018   Recent Labs    07/21/17 0335 12/16/17 1226 01/27/18 1313  NA 135 140 137  K 4.0 4.6 4.2  CL 101 102 101  CO2 23 26 27   GLUCOSE 160* 121* 120*  BUN 30* 28* 21  CREATININE 1.60* 1.37* 1.61*  CALCIUM 8.8* 9.6 9.5  GFRNONAA 42* 50* 41*  GFRAA 48* 58* 48*  PROT  --  8.0 7.3  ALBUMIN  --  4.3 3.9  AST  --  28 31  ALT  --  32 35  ALKPHOS  --  38 37*  BILITOT  --  0.5 0.4    RADIOGRAPHIC STUDIES: I have personally reviewed the radiological images as listed and agreed with the findings in the report. Bone scan showed uptake in the right knee surrounding prosthesis, degenerative type uptake at the shoulders, sternoclavicular joints, lumbar spine left knee and feet.  Also uptake in the mid sternum.  No definitive worrisome of sites of abnormal osseous tracer accumulation suggesting osseous metastatic disease. CT abdomen pelvis without contrast showed brachy therapy seeds in the prostate, no findings suspicious for metastatic disease. ASSESSMENT & PLAN:  1. Rising PSA level   2. Prostate cancer ([place])   3. Seizure ([place])     Apalutamide or enzalutamide are good options for nonmetastatic castration resistant prostate cancer.  However both medications were associated with about less than 1% chance of seizure.  I encourage patient to reschedule with his neurologist and discuss about his seizure risk while on these medication and whether he may continue driving. He may also need additional work up as well.   I have communicated with patient's urologist Dr.Stoiff. He  feels radical proctectomy after RT is difficult, however, patient may be a candidate of cryoablation. He will refer patient to another urology office with the capacity of doing this procedure.   For now, his PSA doubling time is about 21 months, > 10 months, observation is acceptable. We spent sufficient time to discuss many aspect of care, questions were answered to patient's satisfaction.  Orders Placed This Encounter  Procedures  . CBC with Differential/Platelet    Standing Status:   Future    Standing Expiration Date:   01/28/2019  . Comprehensive metabolic panel    Standing Status:   Future    Standing Expiration Date:   01/28/2019  . PSA    Standing Status:   Future    Standing Expiration Date:   01/28/2019    All questions were answered. The patient knows to call the clinic with any problems questions or concerns.  Return of visit: 4 weeks Total face to face encounter time for this patient visit was 25 min. >50% of the time was  spent in counseling and coordination of care.  Earlie Server, MD, PhD Hematology Oncology [facility] at [facility] Pager- [phone] 01/27/2018

## 2018-01-28 LAB — TESTOSTERONE: TESTOSTERONE: 8 ng/dL — AB (ref 264–916)

## 2018-03-29 ENCOUNTER — Other Ambulatory Visit: Payer: Self-pay

## 2018-03-29 ENCOUNTER — Encounter: Payer: Self-pay | Admitting: Oncology

## 2018-03-29 ENCOUNTER — Inpatient Hospital Stay: Payer: Medicare HMO

## 2018-03-29 ENCOUNTER — Inpatient Hospital Stay: Payer: Medicare HMO | Attending: Oncology | Admitting: Oncology

## 2018-03-29 VITALS — BP 125/77 | HR 56 | Temp 97.2°F | Resp 18 | Wt 293.9 lb

## 2018-03-29 DIAGNOSIS — Z79899 Other long term (current) drug therapy: Secondary | ICD-10-CM | POA: Diagnosis not present

## 2018-03-29 DIAGNOSIS — R972 Elevated prostate specific antigen [PSA]: Secondary | ICD-10-CM | POA: Insufficient documentation

## 2018-03-29 DIAGNOSIS — C61 Malignant neoplasm of prostate: Secondary | ICD-10-CM | POA: Insufficient documentation

## 2018-03-29 DIAGNOSIS — G40909 Epilepsy, unspecified, not intractable, without status epilepticus: Secondary | ICD-10-CM | POA: Diagnosis not present

## 2018-03-29 DIAGNOSIS — R7303 Prediabetes: Secondary | ICD-10-CM | POA: Insufficient documentation

## 2018-03-29 DIAGNOSIS — Z87891 Personal history of nicotine dependence: Secondary | ICD-10-CM | POA: Insufficient documentation

## 2018-03-29 DIAGNOSIS — G8929 Other chronic pain: Secondary | ICD-10-CM | POA: Diagnosis not present

## 2018-03-29 DIAGNOSIS — Z7982 Long term (current) use of aspirin: Secondary | ICD-10-CM | POA: Diagnosis not present

## 2018-03-29 DIAGNOSIS — I10 Essential (primary) hypertension: Secondary | ICD-10-CM | POA: Insufficient documentation

## 2018-03-29 DIAGNOSIS — R569 Unspecified convulsions: Secondary | ICD-10-CM | POA: Diagnosis not present

## 2018-03-29 LAB — COMPREHENSIVE METABOLIC PANEL
ALBUMIN: 4 g/dL (ref 3.5–5.0)
ALK PHOS: 42 U/L (ref 38–126)
ALT: 36 U/L (ref 0–44)
ANION GAP: 9 (ref 5–15)
AST: 32 U/L (ref 15–41)
BUN: 28 mg/dL — ABNORMAL HIGH (ref 8–23)
CHLORIDE: 102 mmol/L (ref 98–111)
CO2: 28 mmol/L (ref 22–32)
Calcium: 9.7 mg/dL (ref 8.9–10.3)
Creatinine, Ser: 1.5 mg/dL — ABNORMAL HIGH (ref 0.61–1.24)
GFR calc Af Amer: 52 mL/min — ABNORMAL LOW (ref >60)
GFR calc non Af Amer: 45 mL/min — ABNORMAL LOW (ref >60)
GLUCOSE: 121 mg/dL — AB (ref 70–99)
Potassium: 4.4 mmol/L (ref 3.5–5.1)
Sodium: 139 mmol/L (ref 135–145)
Total Bilirubin: 0.7 mg/dL (ref 0.3–1.2)
Total Protein: 7.2 g/dL (ref 6.5–8.1)

## 2018-03-29 LAB — CBC WITH DIFFERENTIAL/PLATELET
ABS IMMATURE GRANULOCYTES: 0.01 10*3/uL (ref 0.00–0.07)
BASOS ABS: 0 10*3/uL (ref 0.0–0.1)
Basophils Relative: 1 %
Eosinophils Absolute: 0.2 10*3/uL (ref 0.0–0.5)
Eosinophils Relative: 3 %
HEMATOCRIT: 44.3 % (ref 39.0–52.0)
HEMOGLOBIN: 15 g/dL (ref 13.0–17.0)
IMMATURE GRANULOCYTES: 0 %
LYMPHS ABS: 1.3 10*3/uL (ref 0.7–4.0)
LYMPHS PCT: 23 %
MCH: 30.4 pg (ref 26.0–34.0)
MCHC: 33.9 g/dL (ref 30.0–36.0)
MCV: 89.7 fL (ref 80.0–100.0)
MONOS PCT: 16 %
Monocytes Absolute: 0.9 10*3/uL (ref 0.1–1.0)
NEUTROS ABS: 3.3 10*3/uL (ref 1.7–7.7)
Neutrophils Relative %: 57 %
Platelets: 195 10*3/uL (ref 150–400)
RBC: 4.94 MIL/uL (ref 4.22–5.81)
RDW: 12.5 % (ref 11.5–15.5)
WBC: 5.6 10*3/uL (ref 4.0–10.5)
nRBC: 0 % (ref 0.0–0.2)

## 2018-03-29 LAB — PSA: Prostatic Specific Antigen: 8.88 ng/mL — ABNORMAL HIGH (ref 0.00–4.00)

## 2018-03-29 NOTE — Progress Notes (Signed)
Patient here for follow up

## 2018-03-29 NOTE — Progress Notes (Signed)
Hematology/Oncology follow up note [facility] [phone] [phone]   Patient Care Team: [name], MD as PCP - General (Internal Medicine)  REFERRING PROVIDER: Dr.Stoioff  CHIEF COMPLAINTS/REASON FOR VISIT:  Evaluation of prostate cancer  HISTORY OF PRESENTING ILLNESS:  Timothy Maddox is a  72 y.o.  male with PMH listed below who was referred to me for evaluation of prostate cancer.  Prostate cancer diagnosis dated back to 2013.  I do  Not have his previous oncology medical records. Per Dr.Stoioff, he completed IMRT in February 2014 followed by androgen deprivation therapy. Patient previously followed Dr. Marian Sorrow at [facility] urology and then [facility] in [place].  Recently he was seen by Dr. John Giovanni at [facility] urology.  Per urology notes, patient was diagnosed with a Gleason score 5+4 with 100% involvement in 6 out of 6 cores on the left and a 3 out of 6 cores on the right.  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  Bone scan was normal at that time.   Patient was treated with Lupron for about 18 months. PSA starts to rise.  August 2018 PSA was 9.3.  Patient had a CT and bone scan done was negative for metastatic disease.  Testosterone level was 53 in August 2018.  Patient was restarted on Lupron injection every 3 months since then.  December 2018 PSA decreased to 4.9 after ADT restarted. March 2019 PSA 5.4 Testosterone was checked in June 2019 within castration level.  Patient reports chronic back pain.  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  History of seizure, takes Roweepra 500 twice daily, Topiramate 5mg  BID. Marland Kitchen  Daughter accompanies patient to today's visit.  INTERVAL HISTORY Timothy Maddox is a 72 y.o. 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  At  last visit, we had a discussion about starting apalutamide.  As patient has seizure disorder and can potentially be exacerbated while on apalutamide, patient prefers not to take the risk and not starting treatment.  He was advised to see neurology to take a chance of seizure if he would need to be started on this medication. I have also discussed with Dr. [name] and patient supposed to be going to tertiary center for evaluation of cryoablation.  Patient presents today, he has no new complaints.  Chronic back pain at baseline.  He has not followed up with his neurologist yet. He also tells me that he has not been contacted by either urologist office or tertiary center for cryo ablation.   Review of Systems  Constitutional: Negative for chills, fever, malaise/fatigue and weight loss.  HENT: Negative for hearing loss, nosebleeds and sore throat.   Eyes: Negative for double vision, photophobia, pain and redness.  Respiratory: Negative for cough, sputum production, shortness of breath and wheezing.   Cardiovascular: Negative for chest pain, palpitations, orthopnea and leg swelling.  Gastrointestinal: Negative for abdominal pain, blood in stool, nausea and vomiting.  Genitourinary: Negative for dysuria.  Musculoskeletal: Positive for back pain. Negative for myalgias and neck pain.  Skin: Negative for itching and rash.  Neurological: Negative for dizziness, tingling and tremors.  Endo/Heme/Allergies: Negative for environmental allergies. Does not bruise/bleed easily.  Psychiatric/Behavioral: Negative for depression and hallucinations.    MEDICAL HISTORY:  Past Medical History:  Diagnosis Date  . Arthritis    lower back, shoulders  . AVM (arteriovenous malformation) brain   .  Cancer [facility])    prostate- treated with radiation  . CVA (cerebral infarction) 11/2015  . Dyspnea    with exertion  . HOH (hard of hearing)   . Hyperlipidemia   . Hypertension   . Pneumonia    1st grade  .  Pre-diabetes   . Prediabetes   . Seizures ([place]) 12/2015  . Stroke [facility])    speech impairment, no weakness    SURGICAL HISTORY: Past Surgical History:  Procedure Laterality Date  . BACK SURGERY  1999   disectomy  . BACK SURGERY     2 surgery- injury  . COLONOSCOPY W/ POLYPECTOMY    . HAMMER TOE SURGERY Bilateral   . INGUINAL HERNIA REPAIR Right    per patient when he was in 1st grade  . IR GENERIC HISTORICAL  12/05/2015   IR RADIOLOGIST EVAL & MGMT 12/05/2015 [facility]  . IR GENERIC HISTORICAL  01/31/2016   IR RADIOLOGIST EVAL & MGMT 01/31/2016 [facility]  . IR GENERIC HISTORICAL  02/20/2016   IR ANGIO INTRA EXTRACRAN SEL COM CAROTID INNOMINATE UNI L MOD SED 02/20/2016 [name], MD [facility]  . IR GENERIC HISTORICAL  02/20/2016   IR 3D INDEPENDENT WKST 02/20/2016 [name], MD [facility]  . KNEE SURGERY Bilateral 2015  . RADIOLOGY WITH ANESTHESIA N/A 02/20/2016   Procedure: EMBOLIZATION;  Surgeon: [name], MD;  Location: [place];  Service: Radiology;  Laterality: N/A;  . TOE SURGERY Left 2017   TOE NAIL REMOVAL  . TOTAL KNEE ARTHROPLASTY Right 07/20/2017   Procedure: TOTAL KNEE ARTHROPLASTY;  Surgeon: Lovell Sheehan, MD;  Location: [facility];  Service: Orthopedics;  Laterality: Right;    SOCIAL HISTORY: Social History   Socioeconomic History  . Marital status: Single    Spouse name: Not on file  . Number of children: Not on file  . Years of education: Not on file  . Highest education level: Not on file  Occupational History  . Occupation: retired [employer] operational officer  . Financial resource strain: Not on file  . Food insecurity:    Worry: Not on file    Inability: Not on file  . Transportation needs:    Medical: Not on file    Non-medical: Not on file  Tobacco Use  . Smoking status: Former Smoker    Years: 2.00  . Smokeless tobacco: Never Used  . Tobacco comment: quit age 33  Substance and Sexual Activity  . Alcohol use: No    . Drug use: No  . Sexual activity: Not Currently  Lifestyle  . Physical activity:    Days per week: Not on file    Minutes per session: Not on file  . Stress: Not on file  Relationships  . Social connections:    Talks on phone: Not on file    Gets together: Not on file    Attends religious service: Not on file    Active member of club or organization: Not on file    Attends meetings of clubs or organizations: Not on file    Relationship status: Not on file  . Intimate partner violence:    Fear of current or ex partner: Not on file    Emotionally abused: Not on file    Physically abused: Not on file    Forced sexual activity: Not on file  Other Topics Concern  . Not on file  Social History Narrative  . Not on file    FAMILY HISTORY: Family History  Problem Relation Age of Onset  . Heart failure Father   . Diabetes Father   . Hypertension Father   . Alzheimer's disease Father 78  . Other Mother 62       homicide  . Alcohol abuse Mother   . Pancreatic cancer Sister   . Alcohol abuse Sister   . Diabetes Brother     ALLERGIES:  is allergic to poison oak extract and poison ivy extract.  MEDICATIONS:  Current Outpatient Medications  Medication Sig Dispense Refill  . acetaminophen (TYLENOL) 500 MG tablet Take 500 mg by mouth every 6 (six) hours as needed.    Marland Kitchen amLODipine (NORVASC) 10 MG tablet Take 10 mg by mouth daily.    Marland Kitchen levETIRAcetam (ROWEEPRA) 500 MG tablet Take 500 mg by mouth 2 (two) times daily.    Marland Kitchen losartan-hydrochlorothiazide (HYZAAR) 100-25 MG tablet Take 1 tablet by mouth daily.  1  . metoprolol succinate (TOPROL-XL) 100 MG 24 hr tablet Take by mouth.    . rosuvastatin (CRESTOR) 5 MG tablet     . spironolactone (ALDACTONE) 100 MG tablet Take 100 mg by mouth 2 (two) times daily.    Marland Kitchen apalutamide (ERLEADA) 60 MG tablet Take 4 tablets (240 mg total) by mouth daily. May be taken with or without food. Swallow tablets whole. (Patient not taking: Reported on  12/16/2017) 120 tablet 11  . aspirin EC 325 MG EC tablet Take 1 tablet (325 mg total) by mouth daily with breakfast. (Patient not taking: Reported on 12/16/2017) 30 tablet 0  . calcium carbonate (OSCAL) 1500 (600 Ca) MG TABS tablet Take 600 mg of elemental calcium by mouth 2 (two) times daily with a meal.    . cholecalciferol (VITAMIN D) 1000 units tablet Take 1,000 Units by mouth daily.    Marland Kitchen docusate sodium (COLACE) 100 MG capsule Take 1 capsule (100 mg total) by mouth 2 (two) times daily. (Patient not taking: Reported on 01/27/2018) 20 capsule 0  . oxyCODONE (OXY IR/ROXICODONE) 5 MG immediate release tablet Take 1-2 tablets (5-10 mg total) by mouth every 6 (six) hours as needed for moderate pain (pain score 4-6). (Patient not taking: Reported on 12/16/2017) 30 tablet 0  . topiramate (TOPAMAX) 50 MG tablet Take 1 tablet (50 mg total) by mouth 2 (two) times daily. Start one tablet daily x 1 week then twice daily (Patient not taking: Reported on 12/16/2017) 180 tablet 0   No current facility-administered medications for this visit.      PHYSICAL EXAMINATION: ECOG PERFORMANCE STATUS: 1 - Symptomatic but completely ambulatory Vitals:   03/29/18 1355  BP: 125/77  Pulse: (!) 56  Resp: 18  Temp: (!) 97.2 F (36.2 C)   Filed Weights   03/29/18 1355  Weight: 293 lb 14.4 oz (133.3 kg)    Physical Exam  Constitutional: He is oriented to person, place, and time. No distress.  HENT:  Head: Normocephalic and atraumatic.  Mouth/Throat: Oropharynx is clear and moist.  Eyes: Pupils are equal, round, and reactive to light. EOM are normal. No scleral icterus.  Neck: Normal range of motion. Neck supple.  Cardiovascular: Normal rate, regular rhythm and normal heart sounds.  Pulmonary/Chest: Effort normal. No respiratory distress. He has no wheezes.  Abdominal: Soft. Bowel sounds are normal. He exhibits no distension and no mass. There is no tenderness.  Musculoskeletal: Normal range of motion. He  exhibits no edema or deformity.  Neurological: He is alert and oriented to person, place, and time. No cranial nerve deficit. Coordination  normal.  Skin: Skin is warm and dry. No rash noted. No erythema.  Psychiatric: He has a normal mood and affect. His behavior is normal. Thought content normal.     LABORATORY DATA:  I have reviewed the data as listed Lab Results  Component Value Date   WBC 5.6 03/29/2018   HGB 15.0 03/29/2018   HCT 44.3 03/29/2018   MCV 89.7 03/29/2018   PLT 195 03/29/2018   Recent Labs    12/16/17 1226 01/27/18 1313 03/29/18 1334  NA 140 137 139  K 4.6 4.2 4.4  CL 102 101 102  CO2 26 27 28   GLUCOSE 121* 120* 121*  BUN 28* 21 28*  CREATININE 1.37* 1.61* 1.50*  CALCIUM 9.6 9.5 9.7  GFRNONAA 50* 41* 45*  GFRAA 58* 48* 52*  PROT 8.0 7.3 7.2  ALBUMIN 4.3 3.9 4.0  AST 28 31 32  ALT 32 35 36  ALKPHOS 38 37* 42  BILITOT 0.5 0.4 0.7    RADIOGRAPHIC STUDIES: I have personally reviewed the radiological images as listed and agreed with the findings in the report. Bone scan showed uptake in the right knee surrounding prosthesis, degenerative type uptake at the shoulders, sternoclavicular joints, lumbar spine left knee and feet.  Also uptake in the mid sternum.  No definitive worrisome of sites of abnormal osseous tracer accumulation suggesting osseous metastatic disease. CT abdomen pelvis without contrast showed brachy therapy seeds in the prostate, no findings suspicious for metastatic disease. ASSESSMENT & PLAN:  1. Prostate cancer ([place])   2. Rising PSA level   3. Seizure [facility])    Discussed with patient that he currently has biochemical recurrence with no metastasis. Apalutamide or enzalutamide are good options for nonmetastatic castration resistant prostate cancer however both medications were associated with about less than 1% chance of seizure.  I encourage patient to follow-up with neurology and discuss about his chance of having seizure while on these  medication and whether he may continue driving.  I will touch base with Dr. [name] to see if the patient can be referred out for evaluation of cryo ablation.  Secure chat message was sent to Dr.Stoioff.  Patient was also due for Lupron injection.  Previously received local urologist office. Labs reviewed and discussed with patient.  Today's PSA pending. For now, his PSA doubling time is about 21 months, > 10 months, observation is acceptable. We spent sufficient time to discuss many aspect of care, questions were answered to patient's satisfaction.   Orders Placed This Encounter  Procedures  . PSA    Standing Status:   Future    Standing Expiration Date:   03/30/2019  . CBC with Differential/Platelet    Standing Status:   Future    Standing Expiration Date:   03/30/2019  . Comprehensive metabolic panel    Standing Status:   Future    Standing Expiration Date:   03/30/2019    All questions were answered. The patient knows to call the clinic with any problems questions or concerns.  Return of visit: 4 weeks Total face to face encounter time for this patient visit was 25 min. >50% of the time was  spent in counseling and coordination of care.  Earlie Server, MD, PhD Hematology Oncology [facility] Pager- [phone] 03/29/2018

## 2018-06-28 ENCOUNTER — Encounter: Payer: Self-pay | Admitting: Oncology

## 2018-06-28 ENCOUNTER — Inpatient Hospital Stay: Payer: Medicare HMO | Attending: Oncology

## 2018-06-28 ENCOUNTER — Inpatient Hospital Stay: Payer: Medicare HMO | Admitting: Oncology

## 2018-06-28 ENCOUNTER — Other Ambulatory Visit: Payer: Self-pay

## 2018-06-28 ENCOUNTER — Inpatient Hospital Stay (HOSPITAL_BASED_OUTPATIENT_CLINIC_OR_DEPARTMENT_OTHER): Payer: Medicare HMO | Admitting: Oncology

## 2018-06-28 VITALS — BP 109/70 | HR 55 | Temp 96.9°F | Ht 73.0 in | Wt 292.6 lb

## 2018-06-28 DIAGNOSIS — Z9119 Patient's noncompliance with other medical treatment and regimen: Secondary | ICD-10-CM

## 2018-06-28 DIAGNOSIS — M549 Dorsalgia, unspecified: Secondary | ICD-10-CM | POA: Diagnosis not present

## 2018-06-28 DIAGNOSIS — Z87891 Personal history of nicotine dependence: Secondary | ICD-10-CM | POA: Insufficient documentation

## 2018-06-28 DIAGNOSIS — C61 Malignant neoplasm of prostate: Secondary | ICD-10-CM | POA: Diagnosis present

## 2018-06-28 DIAGNOSIS — Z91199 Patient's noncompliance with other medical treatment and regimen due to unspecified reason: Secondary | ICD-10-CM

## 2018-06-28 DIAGNOSIS — R972 Elevated prostate specific antigen [PSA]: Secondary | ICD-10-CM | POA: Diagnosis not present

## 2018-06-28 DIAGNOSIS — R569 Unspecified convulsions: Secondary | ICD-10-CM | POA: Insufficient documentation

## 2018-06-28 DIAGNOSIS — G8929 Other chronic pain: Secondary | ICD-10-CM | POA: Insufficient documentation

## 2018-06-28 LAB — CBC WITH DIFFERENTIAL/PLATELET
Abs Immature Granulocytes: 0.02 10*3/uL (ref 0.00–0.07)
BASOS ABS: 0 10*3/uL (ref 0.0–0.1)
BASOS PCT: 1 %
EOS ABS: 0.2 10*3/uL (ref 0.0–0.5)
EOS PCT: 3 %
HCT: 45.9 % (ref 39.0–52.0)
Hemoglobin: 15.6 g/dL (ref 13.0–17.0)
Immature Granulocytes: 0 %
Lymphocytes Relative: 24 %
Lymphs Abs: 1.3 10*3/uL (ref 0.7–4.0)
MCH: 30.9 pg (ref 26.0–34.0)
MCHC: 34 g/dL (ref 30.0–36.0)
MCV: 90.9 fL (ref 80.0–100.0)
MONO ABS: 0.9 10*3/uL (ref 0.1–1.0)
Monocytes Relative: 17 %
Neutro Abs: 3 10*3/uL (ref 1.7–7.7)
Neutrophils Relative %: 55 %
Platelets: 220 10*3/uL (ref 150–400)
RBC: 5.05 MIL/uL (ref 4.22–5.81)
RDW: 13.3 % (ref 11.5–15.5)
WBC: 5.6 10*3/uL (ref 4.0–10.5)
nRBC: 0 % (ref 0.0–0.2)

## 2018-06-28 LAB — COMPREHENSIVE METABOLIC PANEL
ALK PHOS: 43 U/L (ref 38–126)
ALT: 35 U/L (ref 0–44)
AST: 28 U/L (ref 15–41)
Albumin: 4.3 g/dL (ref 3.5–5.0)
Anion gap: 8 (ref 5–15)
BILIRUBIN TOTAL: 0.7 mg/dL (ref 0.3–1.2)
BUN: 27 mg/dL — AB (ref 8–23)
CALCIUM: 9.7 mg/dL (ref 8.9–10.3)
CHLORIDE: 102 mmol/L (ref 98–111)
CO2: 27 mmol/L (ref 22–32)
CREATININE: 1.49 mg/dL — AB (ref 0.61–1.24)
GFR, EST AFRICAN AMERICAN: 54 mL/min — AB (ref >60)
GFR, EST NON AFRICAN AMERICAN: 46 mL/min — AB (ref >60)
Glucose, Bld: 119 mg/dL — ABNORMAL HIGH (ref 70–99)
Potassium: 4.9 mmol/L (ref 3.5–5.1)
Sodium: 137 mmol/L (ref 135–145)
TOTAL PROTEIN: 7.7 g/dL (ref 6.5–8.1)

## 2018-06-28 LAB — PSA: Prostatic Specific Antigen: 18.38 ng/mL — ABNORMAL HIGH (ref 0.00–4.00)

## 2018-06-28 NOTE — Progress Notes (Signed)
Patient here today for follow up.  Patient states no new concerns today  

## 2018-06-29 NOTE — Progress Notes (Signed)
Hematology/Oncology follow up note [facility] Telephone:(336) [phone] Fax:(336) [phone]   Patient Care Team: [name], MD as PCP - General (Internal Medicine)  REFERRING PROVIDER: Dr.Stoioff  CHIEF COMPLAINTS/REASON FOR VISIT:  Evaluation of prostate cancer  HISTORY OF PRESENTING ILLNESS:  Timothy Maddox is a  73 y.o.  male with PMH listed below who was referred to me for evaluation of prostate cancer.  Prostate cancer diagnosis dated back to 2013.  I do  Not have his previous oncology medical records. Per Dr.Stoioff, he completed IMRT in February 2014 followed by androgen deprivation therapy. Patient previously followed Dr. Marian Sorrow at [facility] urology and then [facility] in [place].  Recently he was seen by Dr. John Giovanni at [facility] urology.  Per urology notes, patient was diagnosed with a Gleason score 5+4 with 100% involvement in 6 out of 6 cores on the left and a 3 out of 6 cores on the right.  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  Bone scan was normal at that time.   Patient was treated with Lupron for about 18 months. PSA starts to rise.  August 2018 PSA was 9.3.  Patient had a CT and bone scan done was negative for metastatic disease.  Testosterone level was 53 in August 2018.  Patient was restarted on Lupron injection every 3 months since then.  December 2018 PSA decreased to 4.9 after ADT restarted. March 2019 PSA 5.4 Testosterone was checked in June 2019 within castration level.  Patient reports chronic back pain.  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  History of seizure, takes Roweepra 500 twice daily, Topiramate 5mg  BID. Timothy Maddox  Daughter accompanies patient to today's visit.  INTERVAL HISTORY Timothy Maddox is a 73 y.o. 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   Patient was last seen by me on 11/25 2019.  At that time he was accompanied by daughter. We had a lengthy discussion about management plan.  He supposed to discuss with Dr. [name] for referral to tertiary center for evaluation of cryoablation.  He was last seen by Dr. [name] on 11/11/2017, Lupron injection was given at that time.He also needs to follow-up with neurologist to discuss if he is able to be started on apalutamide if needed.  Apparently patient has not been compliant following up with urology and neurology.  He also tells me that he has not seen his daughter for a while as well.  He has not followed any instructions we went over at last visit.  He has not had Lupron injection since July 2019.  He feels well at baseline.  Chronic back pain at baseline, not worsened.  Denies urinary symptoms   Review of Systems  Constitutional: Negative for chills, fever, malaise/fatigue and weight loss.  HENT: Negative for hearing loss, nosebleeds and sore throat.   Eyes: Negative for double vision, photophobia, pain and redness.  Respiratory: Negative for cough, sputum production, shortness of breath and wheezing.   Cardiovascular: Negative for chest pain, palpitations, orthopnea and leg swelling.  Gastrointestinal: Negative for abdominal pain, blood in stool, nausea and vomiting.  Genitourinary: Negative for dysuria.  Musculoskeletal: Positive for back pain. Negative for myalgias and neck pain.  Skin: Negative for itching and rash.  Neurological: Negative for dizziness, tingling and tremors.  Endo/Heme/Allergies: Negative for environmental allergies. Does not bruise/bleed easily.  Psychiatric/Behavioral: Negative for depression and hallucinations.  MEDICAL HISTORY:  Past Medical History:  Diagnosis Date  . Arthritis    lower back, shoulders  . AVM (arteriovenous malformation) brain   . Cancer [facility])    prostate- treated with radiation  . CVA (cerebral infarction) 11/2015  .  Dyspnea    with exertion  . HOH (hard of hearing)   . Hyperlipidemia   . Hypertension   . Pneumonia    1st grade  . Pre-diabetes   . Prediabetes   . Seizures ([place]) 12/2015  . Stroke [facility])    speech impairment, no weakness    SURGICAL HISTORY: Past Surgical History:  Procedure Laterality Date  . BACK SURGERY  1999   disectomy  . BACK SURGERY     2 surgery- injury  . COLONOSCOPY W/ POLYPECTOMY    . HAMMER TOE SURGERY Bilateral   . INGUINAL HERNIA REPAIR Right    per patient when he was in 1st grade  . IR GENERIC HISTORICAL  12/05/2015   IR RADIOLOGIST EVAL & MGMT 12/05/2015 [facility]  . IR GENERIC HISTORICAL  01/31/2016   IR RADIOLOGIST EVAL & MGMT 01/31/2016 [facility]  . IR GENERIC HISTORICAL  02/20/2016   IR ANGIO INTRA EXTRACRAN SEL COM CAROTID INNOMINATE UNI L MOD SED 02/20/2016 [name], MD [facility]  . IR GENERIC HISTORICAL  02/20/2016   IR 3D INDEPENDENT WKST 02/20/2016 [name], MD [facility]  . KNEE SURGERY Bilateral 2015  . RADIOLOGY WITH ANESTHESIA N/A 02/20/2016   Procedure: EMBOLIZATION;  Surgeon: [name], MD;  Location: [facility];  Service: Radiology;  Laterality: N/A;  . TOE SURGERY Left 2017   TOE NAIL REMOVAL  . TOTAL KNEE ARTHROPLASTY Right 07/20/2017   Procedure: TOTAL KNEE ARTHROPLASTY;  Surgeon: Lovell Sheehan, MD;  Location: [facility];  Service: Orthopedics;  Laterality: Right;    SOCIAL HISTORY: Social History   Socioeconomic History  . Marital status: Single    Spouse name: Not on file  . Number of children: Not on file  . Years of education: Not on file  . Highest education level: Not on file  Occupational History  . Occupation: retired [employer] operational officer  . Financial resource strain: Not on file  . Food insecurity:    Worry: Not on file    Inability: Not on file  . Transportation needs:    Medical: Not on file    Non-medical: Not on file  Tobacco Use  . Smoking status: Former Smoker    Years:  2.00  . Smokeless tobacco: Never Used  . Tobacco comment: quit age 74  Substance and Sexual Activity  . Alcohol use: No  . Drug use: No  . Sexual activity: Not Currently  Lifestyle  . Physical activity:    Days per week: Not on file    Minutes per session: Not on file  . Stress: Not on file  Relationships  . Social connections:    Talks on phone: Not on file    Gets together: Not on file    Attends religious service: Not on file    Active member of club or organization: Not on file    Attends meetings of clubs or organizations: Not on file    Relationship status: Not on file  . Intimate partner violence:    Fear of current or ex partner: Not on file    Emotionally abused: Not on file    Physically abused: Not on file    Forced sexual activity: Not on  file  Other Topics Concern  . Not on file  Social History Narrative  . Not on file    FAMILY HISTORY: Family History  Problem Relation Age of Onset  . Heart failure Father   . Diabetes Father   . Hypertension Father   . Alzheimer's disease Father 33  . Other Mother 15       homicide  . Alcohol abuse Mother   . Pancreatic cancer Sister   . Alcohol abuse Sister   . Diabetes Brother     ALLERGIES:  is allergic to poison oak extract and poison ivy extract.  MEDICATIONS:  Current Outpatient Medications  Medication Sig Dispense Refill  . acetaminophen (TYLENOL) 500 MG tablet Take 500 mg by mouth every 6 (six) hours as needed.    Timothy Maddox amLODipine (NORVASC) 10 MG tablet Take 10 mg by mouth daily.    Timothy Maddox apalutamide (ERLEADA) 60 MG tablet Take 4 tablets (240 mg total) by mouth daily. May be taken with or without food. Swallow tablets whole. 120 tablet 11  . aspirin EC 325 MG EC tablet Take 1 tablet (325 mg total) by mouth daily with breakfast. 30 tablet 0  . calcium carbonate (OSCAL) 1500 (600 Ca) MG TABS tablet Take 600 mg of elemental calcium by mouth 2 (two) times daily with a meal.    . cholecalciferol (VITAMIN D) 1000 units  tablet Take 1,000 Units by mouth daily.    Timothy Maddox docusate sodium (COLACE) 100 MG capsule Take 1 capsule (100 mg total) by mouth 2 (two) times daily. 20 capsule 0  . levETIRAcetam (ROWEEPRA) 500 MG tablet Take 500 mg by mouth 2 (two) times daily.    Timothy Maddox losartan-hydrochlorothiazide (HYZAAR) 100-25 MG tablet Take 1 tablet by mouth daily.  1  . metoprolol succinate (TOPROL-XL) 100 MG 24 hr tablet Take by mouth.    . rosuvastatin (CRESTOR) 5 MG tablet     . spironolactone (ALDACTONE) 100 MG tablet Take 100 mg by mouth 2 (two) times daily.     No current facility-administered medications for this visit.      PHYSICAL EXAMINATION: ECOG PERFORMANCE STATUS: 1 - Symptomatic but completely ambulatory Vitals:   06/28/18 1320  BP: 109/70  Pulse: (!) 55  Temp: (!) 96.9 F (36.1 C)   Filed Weights   06/28/18 1320  Weight: 292 lb 9 oz (132.7 kg)    Physical Exam Constitutional:      General: He is not in acute distress. HENT:     Head: Normocephalic and atraumatic.  Eyes:     General: No scleral icterus.    Pupils: Pupils are equal, round, and reactive to light.  Neck:     Musculoskeletal: Normal range of motion and neck supple.  Cardiovascular:     Rate and Rhythm: Normal rate and regular rhythm.     Heart sounds: Normal heart sounds.  Pulmonary:     Effort: Pulmonary effort is normal. No respiratory distress.     Breath sounds: No wheezing.  Abdominal:     General: Bowel sounds are normal. There is no distension.     Palpations: Abdomen is soft. There is no mass.     Tenderness: There is no abdominal tenderness.  Musculoskeletal: Normal range of motion.        General: No deformity.  Skin:    General: Skin is warm and dry.     Findings: No erythema or rash.  Neurological:     Mental Status: He is alert and oriented to  person, place, and time.     Cranial Nerves: No cranial nerve deficit.     Coordination: Coordination normal.  Psychiatric:        Behavior: Behavior normal.         Thought Content: Thought content normal.      LABORATORY DATA:  I have reviewed the data as listed Lab Results  Component Value Date   WBC 5.6 06/28/2018   HGB 15.6 06/28/2018   HCT 45.9 06/28/2018   MCV 90.9 06/28/2018   PLT 220 06/28/2018   Recent Labs    01/27/18 1313 03/29/18 1334 06/28/18 1241  NA 137 139 137  K 4.2 4.4 4.9  CL 101 102 102  CO2 27 28 27   GLUCOSE 120* 121* 119*  BUN 21 28* 27*  CREATININE 1.61* 1.50* 1.49*  CALCIUM 9.5 9.7 9.7  GFRNONAA 41* 45* 46*  GFRAA 48* 52* 54*  PROT 7.3 7.2 7.7  ALBUMIN 3.9 4.0 4.3  AST 31 32 28  ALT 35 36 35  ALKPHOS 37* 42 43  BILITOT 0.4 0.7 0.7    RADIOGRAPHIC STUDIES: I have personally reviewed the radiological images as listed and agreed with the findings in the report. Bone scan showed uptake in the right knee surrounding prosthesis, degenerative type uptake at the shoulders, sternoclavicular joints, lumbar spine left knee and feet.  Also uptake in the mid sternum.  No definitive worrisome of sites of abnormal osseous tracer accumulation suggesting osseous metastatic disease. CT abdomen pelvis without contrast showed brachy therapy seeds in the prostate, no findings suspicious for metastatic disease. ASSESSMENT & PLAN:  1. Prostate cancer ([place])   2. Rising PSA level   3. Patient's noncompliance with other medical treatment and regimen   4. Seizure [facility])    I had a lengthy discussion with patient. Her PSA continues to rise, currently at the level of 18.3.  Doubled in the past 3 months. He is not compliant with following up with urology Dr. [name] to get his Lupron injection.  I have called Dr. [name] twice,  awaiting Dr.Stoiff to call back. He has not established care with tertiary center for Cryo- ablation He also has not established care with neurology to discuss about his chance of having seizure while on medication such as apalutamide and enzalutamide. Will contact patient and offer him to come to our  office to get Lupron injection and repeat PSA in one month. He will need to be started on treatment if PSA continues to rise.   Orders Placed This Encounter  Procedures  . PSA    Standing Status:   Future    Standing Expiration Date:   06/29/2019  . CBC with Differential/Platelet    Standing Status:   Future    Standing Expiration Date:   06/29/2019  . Comprehensive metabolic panel    Standing Status:   Future    Standing Expiration Date:   06/29/2019    We spent sufficient time to discuss many aspect of care, questions were answered to patient's satisfaction. Total face to face encounter time for this patient visit was 25 min. >50% of the time was  spent in counseling and coordination of care.   Return of visit: 4 weeks.    Earlie Server, MD, PhD Hematology Oncology [facility] Pager- [phone] 06/29/2018

## 2018-06-30 ENCOUNTER — Telehealth: Payer: Self-pay | Admitting: Urology

## 2018-07-08 ENCOUNTER — Encounter: Payer: Medicare HMO | Attending: Internal Medicine | Admitting: *Deleted

## 2018-07-08 ENCOUNTER — Telehealth: Payer: Self-pay | Admitting: *Deleted

## 2018-07-08 ENCOUNTER — Encounter: Payer: Self-pay | Admitting: *Deleted

## 2018-07-08 VITALS — BP 140/70 | Ht 73.0 in | Wt 292.4 lb

## 2018-07-08 DIAGNOSIS — Z6838 Body mass index (BMI) 38.0-38.9, adult: Secondary | ICD-10-CM | POA: Insufficient documentation

## 2018-07-08 DIAGNOSIS — Z7901 Long term (current) use of anticoagulants: Secondary | ICD-10-CM | POA: Insufficient documentation

## 2018-07-08 DIAGNOSIS — E119 Type 2 diabetes mellitus without complications: Secondary | ICD-10-CM

## 2018-07-08 DIAGNOSIS — R972 Elevated prostate specific antigen [PSA]: Secondary | ICD-10-CM | POA: Insufficient documentation

## 2018-07-08 DIAGNOSIS — E118 Type 2 diabetes mellitus with unspecified complications: Secondary | ICD-10-CM | POA: Insufficient documentation

## 2018-07-08 DIAGNOSIS — C61 Malignant neoplasm of prostate: Secondary | ICD-10-CM | POA: Insufficient documentation

## 2018-07-08 DIAGNOSIS — Z79899 Other long term (current) drug therapy: Secondary | ICD-10-CM | POA: Insufficient documentation

## 2018-07-08 DIAGNOSIS — E669 Obesity, unspecified: Secondary | ICD-10-CM | POA: Diagnosis not present

## 2018-07-08 NOTE — Progress Notes (Signed)
Diabetes Self-Management Education  Visit Type: First/Initial  Appt. Start Time: 1320 Appt. End Time: 1430  07/08/2018  [name], identified by name and date of birth, is a 73 y.o. male with a diagnosis of Diabetes: Type 2.   ASSESSMENT  Blood pressure 140/70, height 6\' 1"  (1.854 m), weight 292 lb 6.4 oz (132.6 kg). Body mass index is 38.58 kg/m.  Diabetes Self-Management Education - 07/08/18 1449      Visit Information   Visit Type  First/Initial      Initial Visit   Diabetes Type  Type 2    Are you currently following a meal plan?  No    Are you taking your medications as prescribed?  Yes   He didn't know he was supposed to be on Metformin. He gets medications via mail. He will discuss with MD.   Date Diagnosed  last month      Health Coping   How would you rate your overall health?  Good      Psychosocial Assessment   Patient Belief/Attitude about Diabetes  Motivated to manage diabetes   "ok"   Self-care barriers  None    Self-management support  Doctor's office;Friends;Family    Other persons present  Friend    Patient Concerns  Nutrition/Meal planning;Glycemic Control;Weight Control;Healthy Lifestyle;Monitoring    Special Needs  None    Preferred Learning Style  Visual;Hands on    Learning Readiness  Change in progress    How often do you need to have someone help you when you read instructions, pamphlets, or other written materials from your doctor or pharmacy?  2 - Rarely    What is the last grade level you completed in school?  9th      Pre-Education Assessment   Patient understands the diabetes disease and treatment process.  Needs Instruction    Patient understands incorporating nutritional management into lifestyle.  Needs Instruction    Patient undertands incorporating physical activity into lifestyle.  Needs Instruction    Patient understands using medications safely.  Needs Instruction    Patient understands monitoring blood glucose, interpreting  and using results  Needs Instruction    Patient understands prevention, detection, and treatment of acute complications.  Needs Instruction    Patient understands prevention, detection, and treatment of chronic complications.  Needs Instruction    Patient understands how to develop strategies to address psychosocial issues.  Needs Instruction    Patient understands how to develop strategies to promote health/change behavior.  Needs Instruction      Complications   Last HgB A1C per patient/outside source  6.7 %   06/28/18   How often do you check your blood sugar?  0 times/day (not testing)   Pt brought his meter (Accu-Chek Guide) to the office and was instructed on use. BG upon return demonstration was 111 mg/dL at 1:55 pm - 3 hrs pp.     Have you had a dilated eye exam in the past 12 months?  No    Have you had a dental exam in the past 12 months?  No    Are you checking your feet?  Yes    How many days per week are you checking your feet?  7      Dietary Intake   Breakfast  usually skips or eats eggs, cereal, milk, fruit, juice, oatmeal, toast    Lunch  food served from [employer] at his resident - meat and vegetables (chicken, [place], beef, fish, potatoes, bread,  peas, beans, corn, rice, pasta, green beans, salad, broccoli)    Dinner  breakfast foods, pizza, hamburger, hot dogs    Snack (evening)  sometimes sweets - cakes    Beverage(s)  water, juice, diet soda      Exercise   Exercise Type  ADL's      Patient Education   Previous Diabetes Education  No    Disease state   Definition of diabetes, type 1 and 2, and the diagnosis of diabetes;Factors that contribute to the development of diabetes    Nutrition management   Role of diet in the treatment of diabetes and the relationship between the three main macronutrients and blood glucose level;Reviewed blood glucose goals for pre and post meals and how to evaluate the patients' food intake on their blood glucose level.    Physical  activity and exercise   Role of exercise on diabetes management, blood pressure control and cardiac health.    Medications  Reviewed patients medication for diabetes, action, purpose, timing of dose and side effects.    Monitoring  Taught/evaluated SMBG meter.;Purpose and frequency of SMBG.;Taught/discussed recording of test results and interpretation of SMBG.;Identified appropriate SMBG and/or A1C goals.    Chronic complications  Relationship between chronic complications and blood glucose control    Psychosocial adjustment  Role of stress on diabetes;Identified and addressed patients feelings and concerns about diabetes      Individualized Goals (developed by patient)   Reducing Risk Improve blood sugars Prevent diabetes complications Lose weight Lead a healthier lifestyle Become more fit     Outcomes   Expected Outcomes  Demonstrated interest in learning. Expect positive outcomes    Future DMSE  4-6 wks       Individualized Plan for Diabetes Self-Management Training:   Learning Objective:  Patient will have a greater understanding of diabetes self-management. Patient education plan is to attend individual and/or group sessions per assessed needs and concerns.   Plan:   Patient Instructions  Check blood sugars 2 x day before breakfast and 2 hrs after one meal 3 x week Bring blood sugar records to the next appointment Exercise:  Walk as tolerated Eat 3 meals day,  1-2  snacks a day Space meals 4-6 hours apart Don't skip meals Avoid sugar sweetened drinks (juices) Complete 3 Day Food Record and bring to next appt Make an eye doctor appointment Return for appointment/classes on:  Monday August 02, 2018 at 1:30 pm with  Pam (dietitian)  Expected Outcomes:  Demonstrated interest in learning. Expect positive outcomes  Education material provided:  General Meal Planning Guidelines Simple Meal Plan 3 Day Food Record  If problems or questions, patient to contact team via:    Johny Drilling, RN, CCM, CDE [phone]  Future DSME appointment: 4-6 wks  August 02, 2018 with the dietitian

## 2018-07-08 NOTE — Patient Instructions (Addendum)
Check blood sugars 2 x day before breakfast and 2 hrs after one meal 3 x week Bring blood sugar records to the next appointment  Exercise:  Walk as tolerated  Eat 3 meals day,  1-2  snacks a day Space meals 4-6 hours apart Don't skip meals Avoid sugar sweetened drinks (juices) Complete 3 Day Food Record and bring to next appt  Make an eye doctor appointment  Return for appointment/classes on:  Monday August 02, 2018 at 1:30 pm with  [facility] (dietitian)

## 2018-07-08 NOTE — Telephone Encounter (Signed)
Schedule patient for Lupron Inj asap/ lab/MD 4 weeks after per Dr.Yu: appts were scheduled as requested Patient is aware of his upcoming INJ appt on 07/12/18 @ 10:45 and the scheduled lab/MD appt. 4 weeks after. Also a reminder will be mailed out.

## 2018-07-09 NOTE — Telephone Encounter (Signed)
error 

## 2018-07-12 ENCOUNTER — Other Ambulatory Visit: Payer: Self-pay

## 2018-07-12 ENCOUNTER — Inpatient Hospital Stay: Payer: Medicare HMO | Attending: Oncology

## 2018-07-12 ENCOUNTER — Encounter: Payer: Self-pay | Admitting: *Deleted

## 2018-07-12 DIAGNOSIS — C61 Malignant neoplasm of prostate: Secondary | ICD-10-CM | POA: Diagnosis not present

## 2018-07-12 DIAGNOSIS — R972 Elevated prostate specific antigen [PSA]: Secondary | ICD-10-CM

## 2018-07-12 DIAGNOSIS — Z5111 Encounter for antineoplastic chemotherapy: Secondary | ICD-10-CM | POA: Insufficient documentation

## 2018-07-12 MED ORDER — LEUPROLIDE ACETATE (4 MONTH) 30 MG IM KIT
30.0000 mg | PACK | Freq: Once | INTRAMUSCULAR | Status: AC
  Administered 2018-07-12: 30 mg via INTRAMUSCULAR

## 2018-07-12 NOTE — Progress Notes (Signed)
Patient came by with questions about his glucometer. Reviewed instructions and BG in the office upon return demonstration was 114 mg/dL - 1 1/2 hrs pp. He returns for an appointment with the dietitian on 3/30. Instructed him to call back if he has any more questions.

## 2018-07-14 ENCOUNTER — Encounter: Payer: Self-pay | Admitting: *Deleted

## 2018-07-14 NOTE — Progress Notes (Signed)
Received call from patient. He had questions about his blood sugar readings. FBG today was 139 mg/dL and 2 hrs after breakfast was 130 mg/dL. Discussed BG goals and encouraged him of his readings. Reminded him to check 3 x week - fasting and 2 hrs after one meal. Discussed meal planning with carbs and proteins. Instructed him to call back if he has any further questions. He sees the dietitian 3/30.

## 2018-08-02 ENCOUNTER — Encounter: Payer: Self-pay | Admitting: Dietician

## 2018-08-02 ENCOUNTER — Other Ambulatory Visit: Payer: Self-pay

## 2018-08-02 ENCOUNTER — Encounter: Payer: Medicare HMO | Admitting: Dietician

## 2018-08-02 VITALS — BP 122/78 | Ht 73.0 in | Wt 285.5 lb

## 2018-08-02 DIAGNOSIS — E119 Type 2 diabetes mellitus without complications: Secondary | ICD-10-CM

## 2018-08-02 DIAGNOSIS — E118 Type 2 diabetes mellitus with unspecified complications: Secondary | ICD-10-CM | POA: Diagnosis not present

## 2018-08-02 NOTE — Progress Notes (Signed)
Diabetes Self-Management Education  Visit Type:  Follow-up  Appt. Start Time: 1335 Appt. End Time: 9678  08/02/2018  [name], identified by name and date of birth, is a 73 y.o. male with a diagnosis of Diabetes:  .   ASSESSMENT  Blood pressure 122/78, height 6\' 1"  (1.854 m), weight 285 lb 8 oz (129.5 kg). Body mass index is 37.67 kg/m.   Diabetes Self-Management Education - 93/81/01 7510      Complications   How often do you check your blood sugar?  3-4 times / week   2-3 times a day, 3 days a week   Fasting Blood glucose range (mg/dL)  70-129    Postprandial Blood glucose range (mg/dL)  70-129;130-179    Have you had a dilated eye exam in the past 12 months?  No    Have you had a dental exam in the past 12 months?  No   wears dentures, since 1980s   Are you checking your feet?  Yes    How many days per week are you checking your feet?  7      Dietary Intake   Breakfast  2-3 meals and 0-1 snacks daily    Snack (evening)  limits snack cakes to once a week    Beverage(s)  water, flavored water, diet soda      Exercise   Exercise Type  Light (walking / raking leaves)    How many days per week to you exercise?  6    How many minutes per day do you exercise?  15    Total minutes per week of exercise  90      Patient Education   Nutrition management   Role of diet in the treatment of diabetes and the relationship between the three main macronutrients and blood glucose level;Meal options for control of blood glucose level and chronic complications.;Other (comment)   basic meal planning with plate method   Physical activity and exercise   Role of exercise on diabetes management, blood pressure control and cardiac health.    Monitoring  Taught/discussed recording of test results and interpretation of SMBG.    Acute complications  Taught treatment of hypoglycemia - the 15 rule.      Post-Education Assessment   Patient understands the diabetes disease and treatment  process.  Demonstrates understanding / competency    Patient understands incorporating nutritional management into lifestyle.  Demonstrates understanding / competency    Patient undertands incorporating physical activity into lifestyle.  Demonstrates understanding / competency    Patient understands using medications safely.  Demonstrates understanding / competency    Patient understands monitoring blood glucose, interpreting and using results  Demonstrates understanding / competency    Patient understands prevention, detection, and treatment of acute complications.  Demonstrates understanding / competency    Patient understands prevention, detection, and treatment of chronic complications.  Needs Review    Patient understands how to develop strategies to address psychosocial issues.  Demonstrates understanding / competency    Patient understands how to develop strategies to promote health/change behavior.  Demonstrates understanding / competency      Outcomes   Program Status  Completed       Learning Objective:  Patient will have a greater understanding of diabetes self-management. Patient education plan is to attend individual and/or group sessions per assessed needs and concerns.  Patient has made significant diet changes, has lost about 7lbs in the past 3 weeks. He is motivated to continue with healthier  eating and is gradually increasing physical activity.   Plan:   Patient Instructions   Doristine Devoid job working on food portions and healthy choices, keep up the good work!  Make sure to include a protein source with each meal -- such as nuts or boiled eggs with cereal, cheese on toast or crackers.   Include "free" vegetables whenever you can, the more you can eat the better!  Try a Healthy Choice or Lean Cuisine frozen meals, and add a fruit or vegetable, or possibly a piece of bread with it to make sure you have enough food to feel satisfied.     Expected Outcomes:  Demonstrated  interest in learning. Expect positive outcomes  Education material provided: Plate planner with food lists; Quick and Healthy Meal Ideas  If problems or questions, patient to contact team via:  Phone and Email

## 2018-08-02 NOTE — Patient Instructions (Signed)
   Great job working on food portions and [employer], keep up the good work!  Make sure to include a protein source with each meal -- such as nuts or boiled eggs with cereal, cheese on toast or crackers.   Include "free" vegetables whenever you can, the more you can eat the better!  Try a Healthy Choice or Lean Cuisine frozen meals, and add a fruit or vegetable, or possibly a piece of bread with it to make sure you have enough food to feel satisfied.

## 2018-08-05 ENCOUNTER — Other Ambulatory Visit: Payer: Medicare HMO

## 2018-08-05 ENCOUNTER — Ambulatory Visit: Payer: Medicare HMO

## 2018-08-09 ENCOUNTER — Other Ambulatory Visit: Payer: Self-pay

## 2018-08-09 ENCOUNTER — Inpatient Hospital Stay: Payer: Medicare HMO | Attending: Oncology | Admitting: Oncology

## 2018-08-09 ENCOUNTER — Inpatient Hospital Stay: Payer: Medicare HMO | Attending: Oncology

## 2018-08-09 ENCOUNTER — Encounter: Payer: Self-pay | Admitting: Oncology

## 2018-08-09 VITALS — BP 104/65 | HR 54 | Temp 98.0°F | Wt 283.5 lb

## 2018-08-09 DIAGNOSIS — I129 Hypertensive chronic kidney disease with stage 1 through stage 4 chronic kidney disease, or unspecified chronic kidney disease: Secondary | ICD-10-CM | POA: Diagnosis not present

## 2018-08-09 DIAGNOSIS — Z7189 Other specified counseling: Secondary | ICD-10-CM

## 2018-08-09 DIAGNOSIS — C61 Malignant neoplasm of prostate: Secondary | ICD-10-CM | POA: Diagnosis not present

## 2018-08-09 DIAGNOSIS — Z79899 Other long term (current) drug therapy: Secondary | ICD-10-CM | POA: Insufficient documentation

## 2018-08-09 DIAGNOSIS — Z8673 Personal history of transient ischemic attack (TIA), and cerebral infarction without residual deficits: Secondary | ICD-10-CM | POA: Insufficient documentation

## 2018-08-09 DIAGNOSIS — R3912 Poor urinary stream: Secondary | ICD-10-CM | POA: Insufficient documentation

## 2018-08-09 DIAGNOSIS — R972 Elevated prostate specific antigen [PSA]: Secondary | ICD-10-CM | POA: Insufficient documentation

## 2018-08-09 DIAGNOSIS — E1122 Type 2 diabetes mellitus with diabetic chronic kidney disease: Secondary | ICD-10-CM | POA: Insufficient documentation

## 2018-08-09 DIAGNOSIS — G8929 Other chronic pain: Secondary | ICD-10-CM | POA: Insufficient documentation

## 2018-08-09 DIAGNOSIS — N189 Chronic kidney disease, unspecified: Secondary | ICD-10-CM | POA: Insufficient documentation

## 2018-08-09 DIAGNOSIS — M549 Dorsalgia, unspecified: Secondary | ICD-10-CM | POA: Insufficient documentation

## 2018-08-09 DIAGNOSIS — I10 Essential (primary) hypertension: Secondary | ICD-10-CM | POA: Diagnosis not present

## 2018-08-09 DIAGNOSIS — Z87891 Personal history of nicotine dependence: Secondary | ICD-10-CM | POA: Insufficient documentation

## 2018-08-09 DIAGNOSIS — R569 Unspecified convulsions: Secondary | ICD-10-CM | POA: Insufficient documentation

## 2018-08-09 DIAGNOSIS — E119 Type 2 diabetes mellitus without complications: Secondary | ICD-10-CM

## 2018-08-09 LAB — COMPREHENSIVE METABOLIC PANEL
ALT: 28 U/L (ref 0–44)
AST: 26 U/L (ref 15–41)
Albumin: 4.4 g/dL (ref 3.5–5.0)
Alkaline Phosphatase: 55 U/L (ref 38–126)
Anion gap: 9 (ref 5–15)
BUN: 28 mg/dL — ABNORMAL HIGH (ref 8–23)
CO2: 25 mmol/L (ref 22–32)
Calcium: 9.8 mg/dL (ref 8.9–10.3)
Chloride: 102 mmol/L (ref 98–111)
Creatinine, Ser: 1.65 mg/dL — ABNORMAL HIGH (ref 0.61–1.24)
GFR calc Af Amer: 47 mL/min — ABNORMAL LOW (ref >60)
GFR calc non Af Amer: 41 mL/min — ABNORMAL LOW (ref >60)
Glucose, Bld: 110 mg/dL — ABNORMAL HIGH (ref 70–99)
Potassium: 4.6 mmol/L (ref 3.5–5.1)
Sodium: 136 mmol/L (ref 135–145)
Total Bilirubin: 0.7 mg/dL (ref 0.3–1.2)
Total Protein: 7.8 g/dL (ref 6.5–8.1)

## 2018-08-09 LAB — CBC WITH DIFFERENTIAL/PLATELET
Abs Immature Granulocytes: 0.01 10*3/uL (ref 0.00–0.07)
Basophils Absolute: 0.1 10*3/uL (ref 0.0–0.1)
Basophils Relative: 1 %
Eosinophils Absolute: 0.2 10*3/uL (ref 0.0–0.5)
Eosinophils Relative: 4 %
HCT: 44.3 % (ref 39.0–52.0)
Hemoglobin: 15.5 g/dL (ref 13.0–17.0)
Immature Granulocytes: 0 %
Lymphocytes Relative: 27 %
Lymphs Abs: 1.5 10*3/uL (ref 0.7–4.0)
MCH: 31.4 pg (ref 26.0–34.0)
MCHC: 35 g/dL (ref 30.0–36.0)
MCV: 89.9 fL (ref 80.0–100.0)
Monocytes Absolute: 0.7 10*3/uL (ref 0.1–1.0)
Monocytes Relative: 13 %
Neutro Abs: 3 10*3/uL (ref 1.7–7.7)
Neutrophils Relative %: 55 %
Platelets: 209 10*3/uL (ref 150–400)
RBC: 4.93 MIL/uL (ref 4.22–5.81)
RDW: 12.6 % (ref 11.5–15.5)
WBC: 5.5 10*3/uL (ref 4.0–10.5)
nRBC: 0 % (ref 0.0–0.2)

## 2018-08-09 LAB — PSA: Prostatic Specific Antigen: 23.75 ng/mL — ABNORMAL HIGH (ref 0.00–4.00)

## 2018-08-09 NOTE — Progress Notes (Signed)
Hematology/Oncology follow up note [facility] [phone] [phone]   Patient Care Team: [name], MD as PCP - General (Internal Medicine) [name], Ronda Fairly, MD (Urology)  REFERRING PROVIDER: Dr.Stoioff  CHIEF COMPLAINTS/REASON FOR VISIT:  Evaluation of prostate cancer  HISTORY OF PRESENTING ILLNESS:  Timothy Maddox is a  73 y.o.  male with PMH listed below who was referred to me for evaluation of prostate cancer.  Prostate cancer diagnosis dated back to 2013.  I do  Not have his previous oncology medical records. Per Dr.Stoioff, he completed IMRT in February 2014 followed by androgen deprivation therapy. Patient previously followed Dr. Marian Sorrow at [facility] urology and then [facility] in [place].  Recently he was seen by Dr. John Giovanni at [facility] urology.  Per urology notes, patient was diagnosed with a Gleason score 5+4 with 100% involvement in 6 out of 6 cores on the left and a 3 out of 6 cores on the right.  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  Bone scan was normal at that time.   Patient was treated with Lupron for about 18 months. PSA starts to rise.  August 2018 PSA was 9.3.  Patient had a CT and bone scan done was negative for metastatic disease.  Testosterone level was 53 in August 2018.  Patient was restarted on Lupron injection every 3 months since then.  December 2018 PSA decreased to 4.9 after ADT restarted. March 2019 PSA 5.4 Testosterone was checked in June 2019 within castration level.  Patient reports chronic back pain.  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  History of seizure, takes Roweepra 500 twice daily, Topiramate 5mg  BID. Marland Kitchen  Daughter accompanies patient to today's visit.  INTERVAL HISTORY [name] is a 73 y.o. 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  Patient was last seen by me 4 weeks ago.  At that point, patient has lost follow-up with urology and has not got Lupron injections after July 2019.  We managed patient to have Lupron injection at cancer center. I also reached out to Dr. [name] during the interval about referral for cryoablation.  Per Dr. [name], he reached out to [facility] and salvage cryoablation is no longer offered for castration resistant prostate cancer.  Patient has not followed up with his neurologist to discuss if he is able to be started on apalutamide if needed.  He also is not compliant following up with urology.  Today he feels at baseline, chronic back pain at baseline.  Not worsened.  Denies any urinary symptoms.    Review of Systems  Constitutional: Negative for chills, fever, malaise/fatigue and weight loss.  HENT: Negative for hearing loss, nosebleeds and sore throat.   Eyes: Negative for double vision, photophobia, pain and redness.  Respiratory: Negative for cough, sputum production, shortness of breath and wheezing.   Cardiovascular: Negative for chest pain, palpitations, orthopnea and leg swelling.  Gastrointestinal: Negative for abdominal pain, blood in stool, nausea and vomiting.  Genitourinary: Negative for dysuria.  Musculoskeletal: Positive for back pain. Negative for myalgias and neck pain.  Skin: Negative for itching and rash.  Neurological: Negative for dizziness, tingling and tremors.  Endo/Heme/Allergies: Negative for environmental allergies. Does not bruise/bleed easily.  Psychiatric/Behavioral: Negative for depression and hallucinations.    MEDICAL HISTORY:  Past Medical History:  Diagnosis Date  . Arthritis    lower back,  shoulders  . AVM (arteriovenous malformation) brain   . Cancer [facility])    prostate- treated with radiation  . CVA (cerebral infarction) 11/2015  . Diabetes mellitus without complication ([place])   . Dyspnea    with exertion  .  HOH (hard of hearing)   . Hyperlipidemia   . Hypertension   . Pneumonia    1st grade  . Pre-diabetes   . Prediabetes   . Seizures ([place]) 12/2015  . Stroke [facility])    speech impairment, no weakness    SURGICAL HISTORY: Past Surgical History:  Procedure Laterality Date  . BACK SURGERY  1999   disectomy  . BACK SURGERY     2 surgery- injury  . COLONOSCOPY W/ POLYPECTOMY    . HAMMER TOE SURGERY Bilateral   . INGUINAL HERNIA REPAIR Right    per patient when he was in 1st grade  . IR GENERIC HISTORICAL  12/05/2015   IR RADIOLOGIST EVAL & MGMT 12/05/2015 [facility]  . IR GENERIC HISTORICAL  01/31/2016   IR RADIOLOGIST EVAL & MGMT 01/31/2016 [facility]  . IR GENERIC HISTORICAL  02/20/2016   IR ANGIO INTRA EXTRACRAN SEL COM CAROTID INNOMINATE UNI L MOD SED 02/20/2016 [name], MD [facility]  . IR GENERIC HISTORICAL  02/20/2016   IR 3D INDEPENDENT WKST 02/20/2016 [name], MD [facility]  . KNEE SURGERY Bilateral 2015  . RADIOLOGY WITH ANESTHESIA N/A 02/20/2016   Procedure: EMBOLIZATION;  Surgeon: [name], MD;  Location: [place];  Service: Radiology;  Laterality: N/A;  . TOE SURGERY Left 2017   TOE NAIL REMOVAL  . TOTAL KNEE ARTHROPLASTY Right 07/20/2017   Procedure: TOTAL KNEE ARTHROPLASTY;  Surgeon: Lovell Sheehan, MD;  Location: [facility];  Service: Orthopedics;  Laterality: Right;    SOCIAL HISTORY: Social History   Socioeconomic History  . Marital status: Single    Spouse name: Not on file  . Number of children: Not on file  . Years of education: Not on file  . Highest education level: Not on file  Occupational History  . Occupation: retired [employer] operational officer  . Financial resource strain: Not on file  . Food insecurity:    Worry: Not on file    Inability: Not on file  . Transportation needs:    Medical: Not on file    Non-medical: Not on file  Tobacco Use  . Smoking status: Former Smoker    Packs/day: 1.00    Years: 2.00    Pack  years: 2.00    Types: Cigarettes    Last attempt to quit: 05/05/1966    Years since quitting: 52.2  . Smokeless tobacco: Never Used  . Tobacco comment: quit age 17  Substance and Sexual Activity  . Alcohol use: Not Currently  . Drug use: No  . Sexual activity: Not Currently  Lifestyle  . Physical activity:    Days per week: Not on file    Minutes per session: Not on file  . Stress: Not on file  Relationships  . Social connections:    Talks on phone: Not on file    Gets together: Not on file    Attends religious service: Not on file    Active member of club or organization: Not on file    Attends meetings of clubs or organizations: Not on file    Relationship status: Not on file  . Intimate partner violence:    Fear of current or ex partner: Not on file  Emotionally abused: Not on file    Physically abused: Not on file    Forced sexual activity: Not on file  Other Topics Concern  . Not on file  Social History Narrative  . Not on file    FAMILY HISTORY: Family History  Problem Relation Age of Onset  . Heart failure Father   . Diabetes Father   . Hypertension Father   . Alzheimer's disease Father 52  . Other Mother 36       homicide  . Alcohol abuse Mother   . Pancreatic cancer Sister   . Alcohol abuse Sister   . Diabetes Brother   . Diabetes Paternal Aunt   . Diabetes Paternal Grandmother   . Diabetes Paternal Grandfather     ALLERGIES:  is allergic to poison oak extract and poison ivy extract.  MEDICATIONS:  Current Outpatient Medications  Medication Sig Dispense Refill  . ACCU-CHEK GUIDE test strip USE 1 STRIP TO CHECK BLOOD SUGAR ONCE DAILY IN THE MORNING    . acetaminophen (TYLENOL) 500 MG tablet Take 500 mg by mouth every 6 (six) hours as needed.    Marland Kitchen amLODipine (NORVASC) 10 MG tablet Take 10 mg by mouth daily.    . diclofenac sodium (VOLTAREN) 1 % GEL diclofenac 1 % topical gel    . levETIRAcetam (ROWEEPRA) 500 MG tablet Take 500 mg by mouth 2 (two)  times daily.    Marland Kitchen losartan-hydrochlorothiazide (HYZAAR) 100-25 MG tablet Take 1 tablet by mouth daily.  1  . metFORMIN (GLUCOPHAGE) 500 MG tablet Take 500 mg by mouth 2 (two) times daily with a meal.    . metoprolol tartrate (LOPRESSOR) 100 MG tablet Take 100 mg by mouth 2 (two) times daily.    . rosuvastatin (CRESTOR) 5 MG tablet Take 5 mg by mouth at bedtime.     Marland Kitchen spironolactone (ALDACTONE) 100 MG tablet Take 100 mg by mouth 2 (two) times daily.     No current facility-administered medications for this visit.      PHYSICAL EXAMINATION: ECOG PERFORMANCE STATUS: 1 - Symptomatic but completely ambulatory Vitals:   08/09/18 1423  BP: 104/65  Pulse: (!) 54  Temp: 98 F (36.7 C)   Filed Weights   08/09/18 1423  Weight: 283 lb 8 oz (128.6 kg)    Physical Exam Constitutional:      General: He is not in acute distress. HENT:     Head: Normocephalic and atraumatic.  Eyes:     General: No scleral icterus.    Pupils: Pupils are equal, round, and reactive to light.  Neck:     Musculoskeletal: Normal range of motion and neck supple.  Cardiovascular:     Rate and Rhythm: Normal rate and regular rhythm.     Heart sounds: Normal heart sounds.  Pulmonary:     Effort: Pulmonary effort is normal. No respiratory distress.     Breath sounds: No wheezing.  Abdominal:     General: Bowel sounds are normal. There is no distension.     Palpations: Abdomen is soft. There is no mass.     Tenderness: There is no abdominal tenderness.  Musculoskeletal: Normal range of motion.        General: No deformity.  Skin:    General: Skin is warm and dry.     Findings: No erythema or rash.  Neurological:     Mental Status: He is alert and oriented to person, place, and time.     Cranial Nerves: No cranial nerve  deficit.     Coordination: Coordination normal.  Psychiatric:        Behavior: Behavior normal.        Thought Content: Thought content normal.      LABORATORY DATA:  I have reviewed  the data as listed Lab Results  Component Value Date   WBC 5.5 08/09/2018   HGB 15.5 08/09/2018   HCT 44.3 08/09/2018   MCV 89.9 08/09/2018   PLT 209 08/09/2018   Recent Labs    03/29/18 1334 06/28/18 1241 08/09/18 1355  NA 139 137 136  K 4.4 4.9 4.6  CL 102 102 102  CO2 28 27 25   GLUCOSE 121* 119* 110*  BUN 28* 27* 28*  CREATININE 1.50* 1.49* 1.65*  CALCIUM 9.7 9.7 9.8  GFRNONAA 45* 46* 41*  GFRAA 52* 54* 47*  PROT 7.2 7.7 7.8  ALBUMIN 4.0 4.3 4.4  AST 32 28 26  ALT 36 35 28  ALKPHOS 42 43 55  BILITOT 0.7 0.7 0.7    RADIOGRAPHIC STUDIES: I have personally reviewed the radiological images as listed and agreed with the findings in the report. 12/23/2017 Bone scan showed uptake in the right knee surrounding prosthesis, degenerative type uptake at the shoulders, sternoclavicular joints, lumbar spine left knee and feet.  Also uptake in the mid sternum.  No definitive worrisome of sites of abnormal osseous tracer accumulation suggesting osseous metastatic disease. 12/23/2017 CT abdomen pelvis without contrast showed brachy therapy seeds in the prostate, no findings suspicious for metastatic disease. ASSESSMENT & PLAN:  1. Prostate cancer ([place])   2. Rising PSA level   3. Seizure ([place])   4. Goals of care, counseling/discussion    #Castration resistant prostate cancer, -non metastatic PSA continues to rise.  08/09/2018 PSA elevated at 23.75 Patient is not compliant following up with urology as well as neurology. Per Dr. [name], cryoablation is not an option for him. Apalutimide and Xtandi may decrease seizure threshold and not ideal for patient.   Goal of care discussed with patient. Palliative intent.  Discussed with patient about rationale about starting Darolutamide-[Nubeqa], , based on the phase 3 ARAMIS clinical trial that evaluated darolutamide plus androgen deprivation therapy (ADT). Patients taking darolutamide showed highly significant improvement in metastasis-free  survival--the primary efficacy endpoint--with a median of 40.4 months compared to 18.4 months for placebo plus ADT. Serious adverse reactions in ? 1% of patients who received NUBEQA were urinary retention, pneumonia, and hematuria, discussed with patient. He voices understanding and agree with plan.  GFR>30, plan to start with Darolutamide 600mg  BID.   #Advice patient to continue follow-up with his neurologist for management of seizure.  Called Dr. Clydene Fake office and request him to call back.  Follow up 1 week for lab, MD assessment after starting Darolutamide.     Orders Placed This Encounter  Procedures  . CBC with Differential/Platelet    Standing Status:   Standing    Number of Occurrences:   20    Standing Expiration Date:   08/17/2019  . Comprehensive metabolic panel    Standing Status:   Standing    Number of Occurrences:   20    Standing Expiration Date:   08/17/2019   .  Earlie Server, MD, PhD Hematology Oncology [facility] Pager- [phone] 08/09/2018

## 2018-08-09 NOTE — Progress Notes (Signed)
Patient here today for follow up.  Patient states no new concerns today  

## 2018-08-10 ENCOUNTER — Telehealth: Payer: Self-pay | Admitting: Neurology

## 2018-08-10 ENCOUNTER — Telehealth: Payer: Self-pay | Admitting: *Deleted

## 2018-08-10 NOTE — Telephone Encounter (Signed)
His Neurologist is Dr Leonie Man with [facility] [phone]

## 2018-08-10 NOTE — Telephone Encounter (Signed)
Pt was last seen 07/2017.None of his medications are manage by Dr. Leonie Man. Dr. [name] stated to phone room that Dr.SEthi has her personal number. THey are aware he is on vacation and Md is willing to wait.

## 2018-08-10 NOTE — Telephone Encounter (Signed)
Thanks. Called  Dr.Sethi and left a message

## 2018-08-10 NOTE — Telephone Encounter (Signed)
Dr.Yu called in wanting to speak with Dr Leonie Man about patients medications

## 2018-08-12 ENCOUNTER — Telehealth: Payer: Self-pay | Admitting: Pharmacist

## 2018-08-12 DIAGNOSIS — C61 Malignant neoplasm of prostate: Secondary | ICD-10-CM

## 2018-08-12 NOTE — Telephone Encounter (Signed)
Oral Oncology Pharmacist Encounter  Received new prescription for Nubeqa (darolutamide) for the treatment of nonmetastatic castration resistant prostate cancer, planned duration until disease progression or unacceptable drug toxicity.  CMP from 08/09/2018 assessed, no relevant lab abnormalities. Prescription dose and frequency assessed.   Current medication list in Epic reviewed, one DDIs with darolutamide identified: - Darolutamide may increase the serum concentration of rosuvastatin. Patient is on a low dose of rosuvastatin, recommend monitor patient for increased rosuvastatin effects/toxicities (ie., myopathy including unexplained muscle pain, tenderness, or weakness).  Oral Oncology Clinic will continue to follow for insurance authorization, copayment issues, initial counseling and start date.  Darl Pikes, PharmD, BCPS, [facility] Hematology/Oncology Clinical Pharmacist [facility] [place] [facility] [phone]  08/12/2018 2:20 PM

## 2018-08-13 ENCOUNTER — Telehealth: Payer: Self-pay | Admitting: Pharmacist

## 2018-08-13 NOTE — Telephone Encounter (Signed)
Oral Oncology Pharmacist Encounter   Received notification from [facility] that prior authorization for Nubeqa is required.   PA submitted on CMM Key [id number]  Status is pending   Oral Oncology Clinic will continue to follow.   Darl Pikes, PharmD, BCPS, [facility] Hematology/Oncology Clinical Pharmacist [facility] Oral [place] [facility] [phone]  08/13/2018 4:29 PM

## 2018-08-13 NOTE — Telephone Encounter (Signed)
Oral Chemotherapy Pharmacist Encounter  Successfully enrolled patient for copayment assistance funds from [facility] from the Prostate Cancer fund.  Award amount: $7,300 Effective dates: 05/15/2018 - 08/12/2019 ID: [id number] BIN: [id number] Group: [id number] PCN: [id number]  Billing information will be shared with [facility]. I will place a copy of the award letter to be scanned into patient's chart.  Darl Pikes, PharmD, BCPS Hematology/Oncology Clinical Pharmacist [facility]/AP [facility] [phone]  08/13/2018 4:49 PM

## 2018-08-13 NOTE — Telephone Encounter (Signed)
Oral Chemotherapy Pharmacist Encounter  Successfully enrolled patient for copayment assistance funds from [employer] from the Prostate Cancer fund.  Award amount: $3,500 Effective dates: 08/13/2018 - 08/13/2019 ID: [id number] BIN: [id number] Group: [id number] PCN: [id number]  Billing information will be shared with [facility]. I will place a copy of the award letter to be scanned into patient's chart.  Darl Pikes, PharmD, BCPS Hematology/Oncology Clinical Pharmacist [facility] Oral [place] [facility] [phone]  08/13/2018 4:35 PM

## 2018-08-13 NOTE — Telephone Encounter (Signed)
Oral Oncology Pharmacist Encounter   Prior Authorization for Norville Haggard has been approved.     PA# [id number] Effective dates: 05/03/2018 through 05/05/2019   Oral Oncology Clinic will continue to follow.   Darl Pikes, PharmD, BCPS. BCOP Hematology/Oncology Clinical Pharmacist [facility] Oral [facility] [phone]  08/13/2018 4:30 PM

## 2018-08-16 NOTE — Telephone Encounter (Signed)
I do not have Dr Collie Siad number to call him

## 2018-08-17 MED ORDER — DAROLUTAMIDE 300 MG PO TABS: 600.0000 mg | ORAL_TABLET | Freq: Two times a day (BID) | ORAL | 3 refills | Status: DC

## 2018-08-18 NOTE — Telephone Encounter (Signed)
Oral Chemotherapy Pharmacist Encounter  Medication will be delivered to Mr. [name] on 08/20/18. He knows to startthe medication when he receives it.  Patient Education I spoke with patient for overview of new oral chemotherapy medication: Nubeqa (darolutamide) for the treatment of nonmetastatic castration resistant prostate cancer, planned duration until disease progression or unacceptable drug toxicity.  Counseled patient on administration, dosing, side effects, monitoring, drug-food interactions, safe handling, storage, and disposal. Patient will take 600 mg by mouth 2 (two) times daily with a meal.  Side effects include but not limited to: fatigue and rash.    Reviewed with patient importance of keeping a medication schedule and plan for any missed doses.  [name] voiced understanding and appreciation. All questions answered. Medication handout placed in the mail.   Provided patient with Oral [place] [facility] phone number. Patient knows to call the office with questions or concerns. Oral Chemotherapy Navigation Clinic will continue to follow.  Darl Pikes, PharmD, BCPS, [facility] Hematology/Oncology Clinical Pharmacist [facility] [place] [facility] [phone]  08/18/2018 11:15 AM

## 2018-08-19 MED FILL — NUBEQA 300 MG TABS: 300 | 30 days supply | Qty: 120 | Fill #0

## 2018-08-20 ENCOUNTER — Telehealth: Payer: Self-pay | Admitting: *Deleted

## 2018-08-20 NOTE — Telephone Encounter (Signed)
Patient called in requesting call back to discuss new oral chemotherapy that he received in the mail. Secure health message sent to [facility], she is going to follow up with patient.

## 2018-08-20 NOTE — Telephone Encounter (Signed)
Oral Chemotherapy Pharmacist Encounter    Called Mr. Stech for re-education. Reviewed dosing, administration, and side effects for Nubeqa.   Darl Pikes, PharmD, BCPS, [facility] Hematology/Oncology Clinical Pharmacist [facility] Oral [place] [facility] [phone]  08/20/2018 3:30 PM

## 2018-08-26 ENCOUNTER — Other Ambulatory Visit: Payer: Self-pay

## 2018-08-27 ENCOUNTER — Encounter: Payer: Self-pay | Admitting: Oncology

## 2018-08-27 ENCOUNTER — Inpatient Hospital Stay: Payer: Medicare HMO

## 2018-08-27 ENCOUNTER — Inpatient Hospital Stay (HOSPITAL_BASED_OUTPATIENT_CLINIC_OR_DEPARTMENT_OTHER): Payer: Medicare HMO | Admitting: Oncology

## 2018-08-27 ENCOUNTER — Other Ambulatory Visit: Payer: Self-pay

## 2018-08-27 VITALS — BP 103/68 | HR 55 | Temp 96.8°F | Ht 73.0 in | Wt 281.0 lb

## 2018-08-27 DIAGNOSIS — Z87891 Personal history of nicotine dependence: Secondary | ICD-10-CM

## 2018-08-27 DIAGNOSIS — R3912 Poor urinary stream: Secondary | ICD-10-CM | POA: Diagnosis not present

## 2018-08-27 DIAGNOSIS — C61 Malignant neoplasm of prostate: Secondary | ICD-10-CM

## 2018-08-27 DIAGNOSIS — E1122 Type 2 diabetes mellitus with diabetic chronic kidney disease: Secondary | ICD-10-CM

## 2018-08-27 DIAGNOSIS — I129 Hypertensive chronic kidney disease with stage 1 through stage 4 chronic kidney disease, or unspecified chronic kidney disease: Secondary | ICD-10-CM

## 2018-08-27 DIAGNOSIS — N189 Chronic kidney disease, unspecified: Secondary | ICD-10-CM | POA: Diagnosis not present

## 2018-08-27 DIAGNOSIS — Z5111 Encounter for antineoplastic chemotherapy: Secondary | ICD-10-CM

## 2018-08-27 DIAGNOSIS — R972 Elevated prostate specific antigen [PSA]: Secondary | ICD-10-CM

## 2018-08-27 DIAGNOSIS — Z8673 Personal history of transient ischemic attack (TIA), and cerebral infarction without residual deficits: Secondary | ICD-10-CM

## 2018-08-27 LAB — CBC WITH DIFFERENTIAL/PLATELET
Abs Immature Granulocytes: 0.01 10*3/uL (ref 0.00–0.07)
Basophils Absolute: 0 10*3/uL (ref 0.0–0.1)
Basophils Relative: 0 %
Eosinophils Absolute: 0.2 10*3/uL (ref 0.0–0.5)
Eosinophils Relative: 4 %
HCT: 43 % (ref 39.0–52.0)
Hemoglobin: 15.1 g/dL (ref 13.0–17.0)
Immature Granulocytes: 0 %
Lymphocytes Relative: 27 %
Lymphs Abs: 1.3 10*3/uL (ref 0.7–4.0)
MCH: 31.2 pg (ref 26.0–34.0)
MCHC: 35.1 g/dL (ref 30.0–36.0)
MCV: 88.8 fL (ref 80.0–100.0)
Monocytes Absolute: 0.7 10*3/uL (ref 0.1–1.0)
Monocytes Relative: 14 %
Neutro Abs: 2.6 10*3/uL (ref 1.7–7.7)
Neutrophils Relative %: 55 %
Platelets: 184 10*3/uL (ref 150–400)
RBC: 4.84 MIL/uL (ref 4.22–5.81)
RDW: 12 % (ref 11.5–15.5)
WBC: 4.8 10*3/uL (ref 4.0–10.5)
nRBC: 0 % (ref 0.0–0.2)

## 2018-08-27 LAB — COMPREHENSIVE METABOLIC PANEL
ALT: 31 U/L (ref 0–44)
AST: 29 U/L (ref 15–41)
Albumin: 4.1 g/dL (ref 3.5–5.0)
Alkaline Phosphatase: 64 U/L (ref 38–126)
Anion gap: 9 (ref 5–15)
BUN: 31 mg/dL — ABNORMAL HIGH (ref 8–23)
CO2: 24 mmol/L (ref 22–32)
Calcium: 9.6 mg/dL (ref 8.9–10.3)
Chloride: 102 mmol/L (ref 98–111)
Creatinine, Ser: 1.82 mg/dL — ABNORMAL HIGH (ref 0.61–1.24)
GFR calc Af Amer: 42 mL/min — ABNORMAL LOW (ref >60)
GFR calc non Af Amer: 36 mL/min — ABNORMAL LOW (ref >60)
Glucose, Bld: 126 mg/dL — ABNORMAL HIGH (ref 70–99)
Potassium: 4.4 mmol/L (ref 3.5–5.1)
Sodium: 135 mmol/L (ref 135–145)
Total Bilirubin: 0.7 mg/dL (ref 0.3–1.2)
Total Protein: 7.6 g/dL (ref 6.5–8.1)

## 2018-08-28 MED ORDER — TAMSULOSIN HCL 0.4 MG PO CAPS: 0.4000 mg | ORAL_CAPSULE | Freq: Every day | ORAL | 1 refills | Status: DC

## 2018-08-28 NOTE — Progress Notes (Signed)
Hematology/Oncology follow up note [facility] [phone] [phone]   Patient Care Team: [name], MD as PCP - General (Internal Medicine) [name], Ronda Fairly, MD (Urology)  REFERRING PROVIDER: Dr.Stoioff  REASON FOR VISIT:  Management of prostate cancer  HISTORY OF PRESENTING ILLNESS:  Timothy Maddox is a  73 y.o.  male with PMH listed below who was referred to me for evaluation of prostate cancer.  Prostate cancer diagnosis dated back to 2013.  I do  Not have his previous oncology medical records. Per Dr.Stoioff, he completed IMRT in February 2014 followed by androgen deprivation therapy. Patient previously followed Dr. Marian Sorrow at [facility] urology and then [facility] in [place].  Recently he was seen by Dr. John Giovanni at [facility] urology.  Per urology notes, patient was diagnosed with a Gleason score 5+4 with 100% involvement in 6 out of 6 cores on the left and a 3 out of 6 cores on the right.  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  Bone scan was normal at that time.   Patient was treated with Lupron for about 18 months. PSA starts to rise.  August 2018 PSA was 9.3.  Patient had a CT and bone scan done was negative for metastatic disease.  Testosterone level was 53 in August 2018.  Patient was restarted on Lupron injection every 3 months since then.  December 2018 PSA decreased to 4.9 after ADT restarted. March 2019 PSA 5.4 Testosterone was checked in June 2019 within castration level.  Patient reports chronic back pain.  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  History of seizure, takes Roweepra 500 twice daily, Topiramate 5mg  BID. Marland Kitchen  Daughter accompanies patient to today's visit.  INTERVAL HISTORY Timothy Maddox is a 73 y.o. 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 .  Started Darolutimide 600mg  BID sinice 08/20/2018.  He reports feeling well.  His creatinine increased from 1.65 to 1.82. He denies any NSAIDs use, dehydration.  With further questions, he reports sometimes having weak urine streak, dribbling.  Last Lupron is 07/12/2018,  Denies any fever chills, nausea vomiting, diarrhea.  Chronic back pain, unchanged.   Review of Systems  Constitutional: Negative for chills, fever, malaise/fatigue and weight loss.  HENT: Negative for hearing loss, nosebleeds and sore throat.   Eyes: Negative for double vision, photophobia, pain and redness.  Respiratory: Negative for cough, sputum production, shortness of breath and wheezing.   Cardiovascular: Negative for chest pain, palpitations, orthopnea and leg swelling.  Gastrointestinal: Negative for abdominal pain, blood in stool, nausea and vomiting.  Genitourinary: Negative for dysuria.  Musculoskeletal: Positive for back pain. Negative for myalgias and neck pain.  Skin: Negative for itching and rash.  Neurological: Negative for dizziness, tingling and tremors.  Endo/Heme/Allergies: Negative for environmental allergies. Does not bruise/bleed easily.  Psychiatric/Behavioral: Negative for depression and hallucinations.    MEDICAL HISTORY:  Past Medical History:  Diagnosis Date   Arthritis    lower back, shoulders   AVM (arteriovenous malformation) brain    Cancer (HCC)    prostate- treated with radiation   CVA (cerebral infarction) 11/2015   Diabetes mellitus without complication (HCC)    Dyspnea    with exertion   HOH (hard of hearing)    Hyperlipidemia    Hypertension    Pneumonia    1st grade   Pre-diabetes  Prediabetes    Seizures ([place]) 12/2015   Stroke ([place])    speech impairment, no weakness    SURGICAL HISTORY: Past Surgical History:  Procedure Laterality Date   BACK SURGERY  1999   disectomy   BACK SURGERY     2  surgery- injury   COLONOSCOPY W/ POLYPECTOMY     HAMMER TOE SURGERY Bilateral    INGUINAL HERNIA REPAIR Right    per patient when he was in 1st grade   IR GENERIC HISTORICAL  12/05/2015   IR RADIOLOGIST EVAL & MGMT 12/05/2015 [facility]   IR GENERIC HISTORICAL  01/31/2016   IR RADIOLOGIST EVAL & MGMT 01/31/2016 [facility]   IR GENERIC HISTORICAL  02/20/2016   IR ANGIO INTRA EXTRACRAN SEL COM CAROTID INNOMINATE UNI L MOD SED 02/20/2016 [name], MD [facility]   IR GENERIC HISTORICAL  02/20/2016   IR 3D INDEPENDENT WKST 02/20/2016 [name], MD [facility]   KNEE SURGERY Bilateral 2015   RADIOLOGY WITH ANESTHESIA N/A 02/20/2016   Procedure: EMBOLIZATION;  Surgeon: [name], MD;  Location: [facility];  Service: Radiology;  Laterality: N/A;   TOE SURGERY Left 2017   TOE NAIL REMOVAL   TOTAL KNEE ARTHROPLASTY Right 07/20/2017   Procedure: TOTAL KNEE ARTHROPLASTY;  Surgeon: Lovell Sheehan, MD;  Location: [facility];  Service: Orthopedics;  Laterality: Right;    SOCIAL HISTORY: Social History   Socioeconomic History   Marital status: Single    Spouse name: Not on file   Number of children: Not on file   Years of education: Not on file   Highest education level: Not on file  Occupational History   Occupation: retired Sport and exercise psychologist strain: Not on file   Food insecurity:    Worry: Not on file    Inability: Not on file   Transportation needs:    Medical: Not on file    Non-medical: Not on file  Tobacco Use   Smoking status: Former Smoker    Packs/day: 1.00    Years: 2.00    Pack years: 2.00    Types: Cigarettes    Last attempt to quit: 05/05/1966    Years since quitting: 52.3   Smokeless tobacco: Never Used   Tobacco comment: quit age 63  Substance and Sexual Activity   Alcohol use: Not Currently   Drug use: No   Sexual activity: Not Currently  Lifestyle   Physical activity:    Days per week:  Not on file    Minutes per session: Not on file   Stress: Not on file  Relationships   Social connections:    Talks on phone: Not on file    Gets together: Not on file    Attends religious service: Not on file    Active member of club or organization: Not on file    Attends meetings of clubs or organizations: Not on file    Relationship status: Not on file   Intimate partner violence:    Fear of current or ex partner: Not on file    Emotionally abused: Not on file    Physically abused: Not on file    Forced sexual activity: Not on file  Other Topics Concern   Not on file  Social History Narrative   Not on file    FAMILY HISTORY: Family History  Problem Relation Age of Onset   Heart failure Father    Diabetes Father    Hypertension Father  Alzheimer's disease Father 60   Other Mother 32       homicide   Alcohol abuse Mother    Pancreatic cancer Sister    Alcohol abuse Sister    Diabetes Brother    Diabetes Paternal Aunt    Diabetes Paternal Grandmother    Diabetes Paternal Grandfather     ALLERGIES:  is allergic to poison oak extract and poison ivy extract.  MEDICATIONS:  Current Outpatient Medications  Medication Sig Dispense Refill   ACCU-CHEK GUIDE test strip USE 1 STRIP TO CHECK BLOOD SUGAR ONCE DAILY IN THE MORNING     acetaminophen (TYLENOL) 500 MG tablet Take 500 mg by mouth every 6 (six) hours as needed.     amLODipine (NORVASC) 10 MG tablet Take 10 mg by mouth daily.     Darolutamide (NUBEQA) 300 MG TABS Take 600 mg by mouth 2 (two) times daily with a meal. 120 tablet 3   diclofenac sodium (VOLTAREN) 1 % GEL diclofenac 1 % topical gel     levETIRAcetam (ROWEEPRA) 500 MG tablet Take 500 mg by mouth 2 (two) times daily.     losartan-hydrochlorothiazide (HYZAAR) 100-25 MG tablet Take 1 tablet by mouth daily.  1   metFORMIN (GLUCOPHAGE) 500 MG tablet Take 500 mg by mouth 2 (two) times daily with a meal.     metoprolol tartrate  (LOPRESSOR) 100 MG tablet Take 100 mg by mouth 2 (two) times daily.     rosuvastatin (CRESTOR) 5 MG tablet Take 5 mg by mouth at bedtime.      spironolactone (ALDACTONE) 100 MG tablet Take 100 mg by mouth 2 (two) times daily.     tamsulosin (FLOMAX) 0.4 MG CAPS capsule Take 1 capsule (0.4 mg total) by mouth daily. 30 capsule 1   No current facility-administered medications for this visit.      PHYSICAL EXAMINATION: ECOG PERFORMANCE STATUS: 1 - Symptomatic but completely ambulatory Vitals:   08/27/18 0907  BP: 103/68  Pulse: (!) 55  Temp: (!) 96.8 F (36 C)   Filed Weights   08/27/18 0907  Weight: 281 lb (127.5 kg)    Physical Exam Constitutional:      General: He is not in acute distress. HENT:     Head: Normocephalic and atraumatic.  Eyes:     General: No scleral icterus.    Pupils: Pupils are equal, round, and reactive to light.  Neck:     Musculoskeletal: Normal range of motion and neck supple.  Cardiovascular:     Rate and Rhythm: Normal rate and regular rhythm.     Heart sounds: Normal heart sounds.  Pulmonary:     Effort: Pulmonary effort is normal. No respiratory distress.     Breath sounds: No wheezing.  Abdominal:     General: Bowel sounds are normal. There is no distension.     Palpations: Abdomen is soft. There is no mass.     Tenderness: There is no abdominal tenderness.  Musculoskeletal: Normal range of motion.        General: No deformity.  Skin:    General: Skin is warm and dry.     Findings: No erythema or rash.  Neurological:     Mental Status: He is alert and oriented to person, place, and time.     Cranial Nerves: No cranial nerve deficit.     Coordination: Coordination normal.  Psychiatric:        Behavior: Behavior normal.        Thought Content: Thought  content normal.      LABORATORY DATA:  I have reviewed the data as listed Lab Results  Component Value Date   WBC 4.8 08/27/2018   HGB 15.1 08/27/2018   HCT 43.0 08/27/2018    MCV 88.8 08/27/2018   PLT 184 08/27/2018   Recent Labs    06/28/18 1241 08/09/18 1355 08/27/18 0840  NA 137 136 135  K 4.9 4.6 4.4  CL 102 102 102  CO2 27 25 24   GLUCOSE 119* 110* 126*  BUN 27* 28* 31*  CREATININE 1.49* 1.65* 1.82*  CALCIUM 9.7 9.8 9.6  GFRNONAA 46* 41* 36*  GFRAA 54* 47* 42*  PROT 7.7 7.8 7.6  ALBUMIN 4.3 4.4 4.1  AST 28 26 29   ALT 35 28 31  ALKPHOS 43 55 64  BILITOT 0.7 0.7 0.7    RADIOGRAPHIC STUDIES: I have personally reviewed the radiological images as listed and agreed with the findings in the report. 8/21/2020Bone scan showed uptake in the right knee surrounding prosthesis, degenerative type uptake at the shoulders, sternoclavicular joints, lumbar spine left knee and feet.  Also uptake in the mid sternum.  No definitive worrisome of sites of abnormal osseous tracer accumulation suggesting osseous metastatic disease. CT abdomen pelvis without contrast showed brachy therapy seeds in the prostate, no findings suspicious for metastatic disease.  ASSESSMENT & PLAN:   1. Prostate cancer (HCC)   2. Weak urine stream   3. Encounter for antineoplastic chemotherapy    Started on Darolutimide 600mg  BID, clinically tolerates well.  Continue. Will repeat PSA in 4 weeks.  Continue ADT, next due 10/31/2018  CKD, creatinine slightly increased.  Weak urine stream and dribble, possible BPH symptoms. Recommend starting trial of flomax, rx sent to pharmacy.    Orders Placed This Encounter  Procedures   PSA    Standing Status:   Future    Standing Expiration Date:   08/28/2019    We spent sufficient time to discuss many aspect of care, questions were answered to patient's satisfaction. Return of visit: 4 weeks.    Earlie Server, MD, PhD Hematology Oncology [facility] at [facility] Pager- [phone] 08/28/2018

## 2018-08-31 ENCOUNTER — Other Ambulatory Visit: Payer: Self-pay

## 2018-09-01 ENCOUNTER — Other Ambulatory Visit: Payer: Self-pay

## 2018-09-01 ENCOUNTER — Inpatient Hospital Stay: Payer: Medicare HMO

## 2018-09-01 DIAGNOSIS — C61 Malignant neoplasm of prostate: Secondary | ICD-10-CM

## 2018-09-01 DIAGNOSIS — R972 Elevated prostate specific antigen [PSA]: Secondary | ICD-10-CM

## 2018-09-01 LAB — CBC WITH DIFFERENTIAL/PLATELET
Abs Immature Granulocytes: 0.01 10*3/uL (ref 0.00–0.07)
Basophils Absolute: 0 10*3/uL (ref 0.0–0.1)
Basophils Relative: 1 %
Eosinophils Absolute: 0.2 10*3/uL (ref 0.0–0.5)
Eosinophils Relative: 3 %
HCT: 44.3 % (ref 39.0–52.0)
Hemoglobin: 15.4 g/dL (ref 13.0–17.0)
Immature Granulocytes: 0 %
Lymphocytes Relative: 25 %
Lymphs Abs: 1.3 10*3/uL (ref 0.7–4.0)
MCH: 31.2 pg (ref 26.0–34.0)
MCHC: 34.8 g/dL (ref 30.0–36.0)
MCV: 89.7 fL (ref 80.0–100.0)
Monocytes Absolute: 0.7 10*3/uL (ref 0.1–1.0)
Monocytes Relative: 14 %
Neutro Abs: 3 10*3/uL (ref 1.7–7.7)
Neutrophils Relative %: 57 %
Platelets: 196 10*3/uL (ref 150–400)
RBC: 4.94 MIL/uL (ref 4.22–5.81)
RDW: 12.1 % (ref 11.5–15.5)
WBC: 5.2 10*3/uL (ref 4.0–10.5)
nRBC: 0 % (ref 0.0–0.2)

## 2018-09-01 LAB — COMPREHENSIVE METABOLIC PANEL
ALT: 27 U/L (ref 0–44)
AST: 25 U/L (ref 15–41)
Albumin: 4.3 g/dL (ref 3.5–5.0)
Alkaline Phosphatase: 65 U/L (ref 38–126)
Anion gap: 8 (ref 5–15)
BUN: 34 mg/dL — ABNORMAL HIGH (ref 8–23)
CO2: 26 mmol/L (ref 22–32)
Calcium: 9.7 mg/dL (ref 8.9–10.3)
Chloride: 102 mmol/L (ref 98–111)
Creatinine, Ser: 1.84 mg/dL — ABNORMAL HIGH (ref 0.61–1.24)
GFR calc Af Amer: 42 mL/min — ABNORMAL LOW (ref >60)
GFR calc non Af Amer: 36 mL/min — ABNORMAL LOW (ref >60)
Glucose, Bld: 118 mg/dL — ABNORMAL HIGH (ref 70–99)
Potassium: 4.6 mmol/L (ref 3.5–5.1)
Sodium: 136 mmol/L (ref 135–145)
Total Bilirubin: 0.7 mg/dL (ref 0.3–1.2)
Total Protein: 7.5 g/dL (ref 6.5–8.1)

## 2018-09-11 MED FILL — NUBEQA 300 MG TABS: 300 | 30 days supply | Qty: 120 | Fill #1

## 2018-09-23 ENCOUNTER — Other Ambulatory Visit: Payer: Self-pay

## 2018-09-24 ENCOUNTER — Other Ambulatory Visit: Payer: Self-pay

## 2018-09-24 ENCOUNTER — Inpatient Hospital Stay (HOSPITAL_BASED_OUTPATIENT_CLINIC_OR_DEPARTMENT_OTHER): Payer: Medicare HMO | Admitting: Oncology

## 2018-09-24 ENCOUNTER — Inpatient Hospital Stay: Payer: Medicare HMO | Attending: Oncology

## 2018-09-24 ENCOUNTER — Encounter: Payer: Self-pay | Admitting: Oncology

## 2018-09-24 DIAGNOSIS — R5383 Other fatigue: Secondary | ICD-10-CM | POA: Diagnosis not present

## 2018-09-24 DIAGNOSIS — N189 Chronic kidney disease, unspecified: Secondary | ICD-10-CM | POA: Diagnosis not present

## 2018-09-24 DIAGNOSIS — E1122 Type 2 diabetes mellitus with diabetic chronic kidney disease: Secondary | ICD-10-CM | POA: Diagnosis not present

## 2018-09-24 DIAGNOSIS — M549 Dorsalgia, unspecified: Secondary | ICD-10-CM | POA: Diagnosis not present

## 2018-09-24 DIAGNOSIS — Z79899 Other long term (current) drug therapy: Secondary | ICD-10-CM

## 2018-09-24 DIAGNOSIS — G8929 Other chronic pain: Secondary | ICD-10-CM | POA: Diagnosis not present

## 2018-09-24 DIAGNOSIS — R972 Elevated prostate specific antigen [PSA]: Secondary | ICD-10-CM

## 2018-09-24 DIAGNOSIS — Z800 Family history of malignant neoplasm of digestive organs: Secondary | ICD-10-CM | POA: Diagnosis not present

## 2018-09-24 DIAGNOSIS — Z87891 Personal history of nicotine dependence: Secondary | ICD-10-CM | POA: Diagnosis not present

## 2018-09-24 DIAGNOSIS — Z8673 Personal history of transient ischemic attack (TIA), and cerebral infarction without residual deficits: Secondary | ICD-10-CM | POA: Diagnosis not present

## 2018-09-24 DIAGNOSIS — R3912 Poor urinary stream: Secondary | ICD-10-CM | POA: Insufficient documentation

## 2018-09-24 DIAGNOSIS — M1990 Unspecified osteoarthritis, unspecified site: Secondary | ICD-10-CM | POA: Diagnosis not present

## 2018-09-24 DIAGNOSIS — C61 Malignant neoplasm of prostate: Secondary | ICD-10-CM | POA: Diagnosis not present

## 2018-09-24 DIAGNOSIS — Z811 Family history of alcohol abuse and dependence: Secondary | ICD-10-CM | POA: Insufficient documentation

## 2018-09-24 DIAGNOSIS — R3911 Hesitancy of micturition: Secondary | ICD-10-CM | POA: Insufficient documentation

## 2018-09-24 DIAGNOSIS — I10 Essential (primary) hypertension: Secondary | ICD-10-CM

## 2018-09-24 DIAGNOSIS — Z8249 Family history of ischemic heart disease and other diseases of the circulatory system: Secondary | ICD-10-CM | POA: Insufficient documentation

## 2018-09-24 DIAGNOSIS — Z833 Family history of diabetes mellitus: Secondary | ICD-10-CM | POA: Diagnosis not present

## 2018-09-24 DIAGNOSIS — Z5111 Encounter for antineoplastic chemotherapy: Secondary | ICD-10-CM

## 2018-09-24 LAB — COMPREHENSIVE METABOLIC PANEL
ALT: 25 U/L (ref 0–44)
AST: 25 U/L (ref 15–41)
Albumin: 4.3 g/dL (ref 3.5–5.0)
Alkaline Phosphatase: 64 U/L (ref 38–126)
Anion gap: 8 (ref 5–15)
BUN: 25 mg/dL — ABNORMAL HIGH (ref 8–23)
CO2: 25 mmol/L (ref 22–32)
Calcium: 9.5 mg/dL (ref 8.9–10.3)
Chloride: 105 mmol/L (ref 98–111)
Creatinine, Ser: 1.56 mg/dL — ABNORMAL HIGH (ref 0.61–1.24)
GFR calc Af Amer: 51 mL/min — ABNORMAL LOW (ref >60)
GFR calc non Af Amer: 44 mL/min — ABNORMAL LOW (ref >60)
Glucose, Bld: 129 mg/dL — ABNORMAL HIGH (ref 70–99)
Potassium: 4.5 mmol/L (ref 3.5–5.1)
Sodium: 138 mmol/L (ref 135–145)
Total Bilirubin: 0.8 mg/dL (ref 0.3–1.2)
Total Protein: 7.5 g/dL (ref 6.5–8.1)

## 2018-09-24 LAB — CBC WITH DIFFERENTIAL/PLATELET
Abs Immature Granulocytes: 0.01 10*3/uL (ref 0.00–0.07)
Basophils Absolute: 0 10*3/uL (ref 0.0–0.1)
Basophils Relative: 1 %
Eosinophils Absolute: 0.2 10*3/uL (ref 0.0–0.5)
Eosinophils Relative: 4 %
HCT: 42.3 % (ref 39.0–52.0)
Hemoglobin: 14.6 g/dL (ref 13.0–17.0)
Immature Granulocytes: 0 %
Lymphocytes Relative: 25 %
Lymphs Abs: 1.2 10*3/uL (ref 0.7–4.0)
MCH: 30.9 pg (ref 26.0–34.0)
MCHC: 34.5 g/dL (ref 30.0–36.0)
MCV: 89.6 fL (ref 80.0–100.0)
Monocytes Absolute: 0.7 10*3/uL (ref 0.1–1.0)
Monocytes Relative: 14 %
Neutro Abs: 2.7 10*3/uL (ref 1.7–7.7)
Neutrophils Relative %: 56 %
Platelets: 179 10*3/uL (ref 150–400)
RBC: 4.72 MIL/uL (ref 4.22–5.81)
RDW: 12.2 % (ref 11.5–15.5)
WBC: 4.7 10*3/uL (ref 4.0–10.5)
nRBC: 0 % (ref 0.0–0.2)

## 2018-09-24 LAB — PSA: Prostatic Specific Antigen: 5.44 ng/mL — ABNORMAL HIGH (ref 0.00–4.00)

## 2018-09-24 MED ORDER — TAMSULOSIN HCL 0.4 MG PO CAPS: 0.4000 mg | ORAL_CAPSULE | Freq: Every day | ORAL | 1 refills | Status: DC

## 2018-09-24 NOTE — Progress Notes (Signed)
Called patient today for Telehealth visit via [place].

## 2018-09-24 NOTE — Progress Notes (Signed)
HEMATOLOGY-ONCOLOGY TeleHEALTH VISIT PROGRESS NOTE  I connected with Timothy Maddox on 09/24/18 at 10:30 AM EDT by video enabled telemedicine visit and verified that I am speaking with the correct person using two identifiers. I discussed the limitations, risks, security and privacy concerns of performing an evaluation and management service by telemedicine and the availability of in-person appointments. I also discussed with the patient that there may be a patient responsible charge related to this service. The patient expressed understanding and agreed to proceed.   I attempted to connect the patient for visual enabled telehealth visit via [employer].  Due to the technical difficulties with video,  Patient was transitioned to audio only visit.  Other persons participating in the visit and their role in the encounter:  Geraldine Solar, CMA, check in patient     Patient's location: Home  Provider's location: work Chief Complaint: Follow-up for management of prostate cancer.   INTERVAL HISTORY Timothy Maddox is a 73 y.o. male who has above history reviewed by me today presents for follow up visit for management of prostate cancer Problems and complaints are listed below:  Patient has been taking D[place]lo tonight 600 mg twice daily since 08/20/2018. Reports tolerating well.  Denies any recognizable side effects. He also takes Flomax and reports urinary hesitation, weak stream and dribbling has improved. Last Lupron is on 07/12/2018. Denies any fever, chills, nausea, vomiting, diarrhea.  Chronic back pain unchanged.  Review of Systems  Constitutional: Positive for fatigue. Negative for appetite change, chills, fever and unexpected weight change.  HENT:   Negative for hearing loss and voice change.   Eyes: Negative for eye problems and icterus.  Respiratory: Negative for chest tightness, cough and shortness of breath.   Cardiovascular: Negative for chest pain and leg swelling.  Gastrointestinal:  Negative for abdominal distention and abdominal pain.  Endocrine: Negative for hot flashes.  Genitourinary: Negative for difficulty urinating, dysuria and frequency.        Hesitancy and dribbling improved.  Musculoskeletal: Negative for arthralgias.  Skin: Negative for itching and rash.  Neurological: Negative for light-headedness and numbness.  Hematological: Negative for adenopathy. Does not bruise/bleed easily.  Psychiatric/Behavioral: Negative for confusion.    Past Medical History:  Diagnosis Date  . Arthritis    lower back, shoulders  . AVM (arteriovenous malformation) brain   . Cancer [facility])    prostate- treated with radiation  . CVA (cerebral infarction) 11/2015  . Diabetes mellitus without complication ([place])   . Dyspnea    with exertion  . HOH (hard of hearing)   . Hyperlipidemia   . Hypertension   . Pneumonia    1st grade  . Pre-diabetes   . Prediabetes   . Seizures ([place]) 12/2015  . Stroke [facility])    speech impairment, no weakness   Past Surgical History:  Procedure Laterality Date  . BACK SURGERY  1999   disectomy  . BACK SURGERY     2 surgery- injury  . COLONOSCOPY W/ POLYPECTOMY    . HAMMER TOE SURGERY Bilateral   . INGUINAL HERNIA REPAIR Right    per patient when he was in 1st grade  . IR GENERIC HISTORICAL  12/05/2015   IR RADIOLOGIST EVAL & MGMT 12/05/2015 [facility]  . IR GENERIC HISTORICAL  01/31/2016   IR RADIOLOGIST EVAL & MGMT 01/31/2016 [facility]  . IR GENERIC HISTORICAL  02/20/2016   IR ANGIO INTRA EXTRACRAN SEL COM CAROTID INNOMINATE UNI L MOD SED 02/20/2016 [name], MD [facility]  .  IR GENERIC HISTORICAL  02/20/2016   IR 3D INDEPENDENT WKST 02/20/2016 [name], MD [facility]  . KNEE SURGERY Bilateral 2015  . RADIOLOGY WITH ANESTHESIA N/A 02/20/2016   Procedure: EMBOLIZATION;  Surgeon: [name], MD;  Location: [facility];  Service: Radiology;  Laterality: N/A;  . TOE SURGERY Left 2017   TOE NAIL REMOVAL  .  TOTAL KNEE ARTHROPLASTY Right 07/20/2017   Procedure: TOTAL KNEE ARTHROPLASTY;  Surgeon: Lovell Sheehan, MD;  Location: [facility];  Service: Orthopedics;  Laterality: Right;    Family History  Problem Relation Age of Onset  . Heart failure Father   . Diabetes Father   . Hypertension Father   . Alzheimer's disease Father 9  . Other Mother 21       homicide  . Alcohol abuse Mother   . Pancreatic cancer Sister   . Alcohol abuse Sister   . Diabetes Brother   . Diabetes Paternal Aunt   . Diabetes Paternal Grandmother   . Diabetes Paternal Grandfather     Social History   Socioeconomic History  . Marital status: Single    Spouse name: Not on file  . Number of children: Not on file  . Years of education: Not on file  . Highest education level: Not on file  Occupational History  . Occupation: retired Diplomatic Services operational officer  . Financial resource strain: Not on file  . Food insecurity:    Worry: Not on file    Inability: Not on file  . Transportation needs:    Medical: Not on file    Non-medical: Not on file  Tobacco Use  . Smoking status: Former Smoker    Packs/day: 1.00    Years: 2.00    Pack years: 2.00    Types: Cigarettes    Last attempt to quit: 05/05/1966    Years since quitting: 52.4  . Smokeless tobacco: Never Used  . Tobacco comment: quit age 72  Substance and Sexual Activity  . Alcohol use: Not Currently  . Drug use: No  . Sexual activity: Not Currently  Lifestyle  . Physical activity:    Days per week: Not on file    Minutes per session: Not on file  . Stress: Not on file  Relationships  . Social connections:    Talks on phone: Not on file    Gets together: Not on file    Attends religious service: Not on file    Active member of club or organization: Not on file    Attends meetings of clubs or organizations: Not on file    Relationship status: Not on file  . Intimate partner violence:    Fear of current or ex partner: Not on file    Emotionally  abused: Not on file    Physically abused: Not on file    Forced sexual activity: Not on file  Other Topics Concern  . Not on file  Social History Narrative  . Not on file    Current Outpatient Medications on File Prior to Visit  Medication Sig Dispense Refill  . ACCU-CHEK GUIDE test strip USE 1 STRIP TO CHECK BLOOD SUGAR ONCE DAILY IN THE MORNING    . acetaminophen (TYLENOL) 500 MG tablet Take 500 mg by mouth every 6 (six) hours as needed.    Marland Kitchen amLODipine (NORVASC) 10 MG tablet Take 10 mg by mouth daily.    . Darolutamide (NUBEQA) 300 MG TABS Take 600 mg by mouth 2 (two) times daily with  a meal. 120 tablet 3  . diclofenac sodium (VOLTAREN) 1 % GEL diclofenac 1 % topical gel    . levETIRAcetam (ROWEEPRA) 500 MG tablet Take 500 mg by mouth 2 (two) times daily.    Marland Kitchen losartan-hydrochlorothiazide (HYZAAR) 100-25 MG tablet Take 1 tablet by mouth daily.  1  . metFORMIN (GLUCOPHAGE) 500 MG tablet Take 500 mg by mouth 2 (two) times daily with a meal.    . metoprolol tartrate (LOPRESSOR) 100 MG tablet Take 100 mg by mouth 2 (two) times daily.    . rosuvastatin (CRESTOR) 5 MG tablet Take 5 mg by mouth at bedtime.     Marland Kitchen spironolactone (ALDACTONE) 100 MG tablet Take 100 mg by mouth 2 (two) times daily.     No current facility-administered medications on file prior to visit.     Allergies  Allergen Reactions  . Poison Oak Extract Rash  . Poison Ivy Extract Rash       Observations/Objective: Today's Vitals   09/24/18 1057  PainSc: 0-No pain   There is no height or weight on file to calculate BMI.  Physical Exam  Constitutional: He is oriented to person, place, and time.  Neurological: He is alert and oriented to person, place, and time.    CBC    Component Value Date/Time   WBC 4.7 09/24/2018 0941   RBC 4.72 09/24/2018 0941   HGB 14.6 09/24/2018 0941   HGB 14.6 08/27/2014 1410   HCT 42.3 09/24/2018 0941   HCT 42.8 08/27/2014 1410   PLT 179 09/24/2018 0941   PLT 214 08/27/2014  1410   MCV 89.6 09/24/2018 0941   MCV 86 08/27/2014 1410   MCH 30.9 09/24/2018 0941   MCHC 34.5 09/24/2018 0941   RDW 12.2 09/24/2018 0941   RDW 14.5 08/27/2014 1410   LYMPHSABS 1.2 09/24/2018 0941   MONOABS 0.7 09/24/2018 0941   EOSABS 0.2 09/24/2018 0941   BASOSABS 0.0 09/24/2018 0941    CMP     Component Value Date/Time   NA 138 09/24/2018 0941   NA 140 12/03/2016 1632   NA 141 08/27/2014 1410   K 4.5 09/24/2018 0941   K 3.0 (L) 08/27/2014 1410   CL 105 09/24/2018 0941   CL 102 08/27/2014 1410   CO2 25 09/24/2018 0941   CO2 32 08/27/2014 1410   GLUCOSE 129 (H) 09/24/2018 0941   GLUCOSE 112 (H) 08/27/2014 1410   BUN 25 (H) 09/24/2018 0941   BUN 18 12/03/2016 1632   BUN 14 08/27/2014 1410   CREATININE 1.56 (H) 09/24/2018 0941   CREATININE 0.95 08/27/2014 1410   CALCIUM 9.5 09/24/2018 0941   CALCIUM 8.9 08/27/2014 1410   PROT 7.5 09/24/2018 0941   PROT 7.7 12/03/2016 1632   ALBUMIN 4.3 09/24/2018 0941   ALBUMIN 4.5 12/03/2016 1632   AST 25 09/24/2018 0941   ALT 25 09/24/2018 0941   ALKPHOS 64 09/24/2018 0941   BILITOT 0.8 09/24/2018 0941   BILITOT 0.3 12/03/2016 1632   GFRNONAA 44 (L) 09/24/2018 0941   GFRNONAA >60 08/27/2014 1410   GFRAA 51 (L) 09/24/2018 0941   GFRAA >60 08/27/2014 1410     Assessment and Plan: 1. Prostate cancer (HCC)   2. Weak urine stream   3. Encounter for antineoplastic chemotherapy     Labs are reviewed and discussed with patient.  PSA trended down.  Good response to treatment. He tolerates Delalutin might 600 mg twice daily.  Continue current regimen. Continue ADT.  Next Lupron is due  on 10/31/2018.  CKD, creatinine trending down. Weak urine stream and dribbling, possible BPH.  Continue Flomax.  Symptom has improved.  Follow Up Instructions:  Lab MD 4 weeks with Lupron injection.  I discussed the assessment and treatment plan with the patient. The patient was provided an opportunity to ask questions and all were answered. The  patient agreed with the plan and demonstrated an understanding of the instructions.  The patient was advised to call back or seek an in-person evaluation if the symptoms worsen or if the condition fails to improve as anticipated.    Earlie Server, MD 09/24/2018 6:48 PM

## 2018-09-28 ENCOUNTER — Ambulatory Visit: Payer: Medicare HMO | Admitting: Oncology

## 2018-09-28 ENCOUNTER — Other Ambulatory Visit: Payer: Medicare HMO

## 2018-10-01 ENCOUNTER — Inpatient Hospital Stay: Payer: Medicare HMO

## 2018-10-01 ENCOUNTER — Inpatient Hospital Stay: Payer: Medicare HMO | Admitting: Oncology

## 2018-10-13 MED FILL — NUBEQA 300 MG TABS: 300 | 30 days supply | Qty: 120 | Fill #2

## 2018-10-15 ENCOUNTER — Other Ambulatory Visit: Payer: Self-pay | Admitting: *Deleted

## 2018-10-15 MED ORDER — TAMSULOSIN HCL 0.4 MG PO CAPS: 0.4000 mg | ORAL_CAPSULE | Freq: Every day | ORAL | 1 refills | Status: DC

## 2018-11-01 ENCOUNTER — Other Ambulatory Visit: Payer: Self-pay

## 2018-11-01 ENCOUNTER — Inpatient Hospital Stay: Payer: Medicare HMO

## 2018-11-01 ENCOUNTER — Encounter: Payer: Self-pay | Admitting: Oncology

## 2018-11-01 ENCOUNTER — Inpatient Hospital Stay: Payer: Medicare HMO | Attending: Oncology | Admitting: Oncology

## 2018-11-01 VITALS — BP 106/68 | HR 52 | Temp 98.3°F | Wt 276.6 lb

## 2018-11-01 DIAGNOSIS — M19011 Primary osteoarthritis, right shoulder: Secondary | ICD-10-CM | POA: Diagnosis not present

## 2018-11-01 DIAGNOSIS — C61 Malignant neoplasm of prostate: Secondary | ICD-10-CM

## 2018-11-01 DIAGNOSIS — M19012 Primary osteoarthritis, left shoulder: Secondary | ICD-10-CM

## 2018-11-01 DIAGNOSIS — E1122 Type 2 diabetes mellitus with diabetic chronic kidney disease: Secondary | ICD-10-CM | POA: Diagnosis not present

## 2018-11-01 DIAGNOSIS — Z833 Family history of diabetes mellitus: Secondary | ICD-10-CM

## 2018-11-01 DIAGNOSIS — G8929 Other chronic pain: Secondary | ICD-10-CM | POA: Insufficient documentation

## 2018-11-01 DIAGNOSIS — Z5111 Encounter for antineoplastic chemotherapy: Secondary | ICD-10-CM | POA: Diagnosis not present

## 2018-11-01 DIAGNOSIS — Z811 Family history of alcohol abuse and dependence: Secondary | ICD-10-CM | POA: Diagnosis not present

## 2018-11-01 DIAGNOSIS — Z800 Family history of malignant neoplasm of digestive organs: Secondary | ICD-10-CM | POA: Insufficient documentation

## 2018-11-01 DIAGNOSIS — M549 Dorsalgia, unspecified: Secondary | ICD-10-CM | POA: Insufficient documentation

## 2018-11-01 DIAGNOSIS — Z79899 Other long term (current) drug therapy: Secondary | ICD-10-CM | POA: Insufficient documentation

## 2018-11-01 DIAGNOSIS — Z8249 Family history of ischemic heart disease and other diseases of the circulatory system: Secondary | ICD-10-CM | POA: Diagnosis not present

## 2018-11-01 DIAGNOSIS — Z820 Family history of epilepsy and other diseases of the nervous system: Secondary | ICD-10-CM | POA: Insufficient documentation

## 2018-11-01 DIAGNOSIS — N183 Chronic kidney disease, stage 3 unspecified: Secondary | ICD-10-CM

## 2018-11-01 DIAGNOSIS — R972 Elevated prostate specific antigen [PSA]: Secondary | ICD-10-CM

## 2018-11-01 DIAGNOSIS — M47816 Spondylosis without myelopathy or radiculopathy, lumbar region: Secondary | ICD-10-CM | POA: Diagnosis not present

## 2018-11-01 DIAGNOSIS — Z8673 Personal history of transient ischemic attack (TIA), and cerebral infarction without residual deficits: Secondary | ICD-10-CM | POA: Insufficient documentation

## 2018-11-01 DIAGNOSIS — Z87891 Personal history of nicotine dependence: Secondary | ICD-10-CM | POA: Diagnosis not present

## 2018-11-01 DIAGNOSIS — Z79818 Long term (current) use of other agents affecting estrogen receptors and estrogen levels: Secondary | ICD-10-CM

## 2018-11-01 LAB — CBC WITH DIFFERENTIAL/PLATELET
Abs Immature Granulocytes: 0.02 10*3/uL (ref 0.00–0.07)
Basophils Absolute: 0 10*3/uL (ref 0.0–0.1)
Basophils Relative: 0 %
Eosinophils Absolute: 0.2 10*3/uL (ref 0.0–0.5)
Eosinophils Relative: 3 %
HCT: 41.2 % (ref 39.0–52.0)
Hemoglobin: 14.3 g/dL (ref 13.0–17.0)
Immature Granulocytes: 0 %
Lymphocytes Relative: 22 %
Lymphs Abs: 1.3 10*3/uL (ref 0.7–4.0)
MCH: 31.2 pg (ref 26.0–34.0)
MCHC: 34.7 g/dL (ref 30.0–36.0)
MCV: 89.8 fL (ref 80.0–100.0)
Monocytes Absolute: 0.8 10*3/uL (ref 0.1–1.0)
Monocytes Relative: 13 %
Neutro Abs: 3.6 10*3/uL (ref 1.7–7.7)
Neutrophils Relative %: 62 %
Platelets: 192 10*3/uL (ref 150–400)
RBC: 4.59 MIL/uL (ref 4.22–5.81)
RDW: 12.2 % (ref 11.5–15.5)
WBC: 5.8 10*3/uL (ref 4.0–10.5)
nRBC: 0 % (ref 0.0–0.2)

## 2018-11-01 LAB — COMPREHENSIVE METABOLIC PANEL
ALT: 22 U/L (ref 0–44)
AST: 23 U/L (ref 15–41)
Albumin: 4.2 g/dL (ref 3.5–5.0)
Alkaline Phosphatase: 49 U/L (ref 38–126)
Anion gap: 10 (ref 5–15)
BUN: 32 mg/dL — ABNORMAL HIGH (ref 8–23)
CO2: 24 mmol/L (ref 22–32)
Calcium: 9.7 mg/dL (ref 8.9–10.3)
Chloride: 104 mmol/L (ref 98–111)
Creatinine, Ser: 1.59 mg/dL — ABNORMAL HIGH (ref 0.61–1.24)
GFR calc Af Amer: 50 mL/min — ABNORMAL LOW (ref >60)
GFR calc non Af Amer: 43 mL/min — ABNORMAL LOW (ref >60)
Glucose, Bld: 115 mg/dL — ABNORMAL HIGH (ref 70–99)
Potassium: 4.7 mmol/L (ref 3.5–5.1)
Sodium: 138 mmol/L (ref 135–145)
Total Bilirubin: 0.8 mg/dL (ref 0.3–1.2)
Total Protein: 7.5 g/dL (ref 6.5–8.1)

## 2018-11-01 LAB — PSA: Prostatic Specific Antigen: 3.21 ng/mL (ref 0.00–4.00)

## 2018-11-01 MED ORDER — LEUPROLIDE ACETATE (4 MONTH) 30 MG IM KIT
30.0000 mg | PACK | Freq: Once | INTRAMUSCULAR | Status: AC
  Administered 2018-11-01: 30 mg via INTRAMUSCULAR
  Filled 2018-11-01: qty 30

## 2018-11-01 NOTE — Progress Notes (Signed)
Patient here today for follow up and Lupron injection.  Patient states no new concerns today

## 2018-11-01 NOTE — Progress Notes (Signed)
Hematology/Oncology follow up note [facility] Telephone:(336) [phone] Fax:(336) [phone]   Patient Care Team: [name], MD as PCP - General (Internal Medicine) [name], Ronda Fairly, MD (Urology)  REFERRING PROVIDER: Dr.Stoioff  REASON FOR VISIT:  Management of prostate cancer  HISTORY OF PRESENTING ILLNESS:  Timothy Maddox is a  73 y.o.  male with PMH listed below who was referred to me for evaluation of prostate cancer.  Prostate cancer diagnosis dated back to 2013.  I do  Not have his previous oncology medical records. Per Dr.Stoioff, he completed IMRT in February 2014 followed by androgen deprivation therapy. Patient previously followed Dr. Marian Sorrow at [facility] urology and then [place] in [place].  Recently he was seen by Dr. John Giovanni at [facility] urology.  Per urology notes, patient was diagnosed with a Gleason score 5+4 with 100% involvement in 6 out of 6 cores on the left and a 3 out of 6 cores on the right.  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  Bone scan was normal at that time.   Patient was treated with Lupron for about 18 months. PSA starts to rise.  August 2018 PSA was 9.3.  Patient had a CT and bone scan done was negative for metastatic disease.  Testosterone level was 53 in August 2018.  Patient was restarted on Lupron injection every 3 months since then.  December 2018 PSA decreased to 4.9 after ADT restarted. March 2019 PSA 5.4 Testosterone was checked in June 2019 within castration level.  Patient reports chronic back pain.  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  History of seizure, takes Roweepra 500 twice daily, Topiramate 5mg  BID. Marland Kitchen  Daughter accompanies patient to today's visit.  INTERVAL HISTORY Timothy Maddox is a 73 y.o. 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 . Reports feeling pretty well today. Patient is on Delalutin might 600 mg twice daily since 08/20/2018. Denies any side effects . Chronic back pain unchanged. Review of Systems  Constitutional: Negative for chills, fever, malaise/fatigue and weight loss.  HENT: Negative for sore throat.   Eyes: Negative for redness.  Respiratory: Negative for cough, shortness of breath and wheezing.   Cardiovascular: Negative for chest pain, palpitations and leg swelling.  Gastrointestinal: Negative for abdominal pain, blood in stool, nausea and vomiting.  Genitourinary: Negative for dysuria.  Musculoskeletal: Positive for back pain. Negative for myalgias.  Skin: Negative for rash.  Neurological: Negative for dizziness, tingling and tremors.  Endo/Heme/Allergies: Does not bruise/bleed easily.  Psychiatric/Behavioral: Negative for hallucinations.    MEDICAL HISTORY:  Past Medical History:  Diagnosis Date  . Arthritis    lower back, shoulders  . AVM (arteriovenous malformation) brain   . Cancer [facility])    prostate- treated with radiation  . CVA (cerebral infarction) 11/2015  . Diabetes mellitus without complication ([place])   . Dyspnea    with exertion  . HOH (hard of hearing)   . Hyperlipidemia   . Hypertension   . Pneumonia    1st grade  . Pre-diabetes   . Prediabetes   . Seizures ([place]) 12/2015  . Stroke [facility])    speech impairment, no weakness    SURGICAL HISTORY: Past Surgical History:  Procedure Laterality Date  . BACK SURGERY  1999   disectomy  . BACK SURGERY     2 surgery- injury  . COLONOSCOPY W/ POLYPECTOMY    .  HAMMER TOE SURGERY Bilateral   . INGUINAL HERNIA REPAIR Right    per patient when he was in 1st grade  . IR GENERIC HISTORICAL  12/05/2015   IR RADIOLOGIST EVAL & MGMT 12/05/2015 [facility]  . IR GENERIC HISTORICAL  01/31/2016   IR RADIOLOGIST EVAL & MGMT 01/31/2016 [facility]  . IR GENERIC HISTORICAL  02/20/2016    IR ANGIO INTRA EXTRACRAN SEL COM CAROTID INNOMINATE UNI L MOD SED 02/20/2016 [name], MD [facility]  . IR GENERIC HISTORICAL  02/20/2016   IR 3D INDEPENDENT WKST 02/20/2016 [name], MD [facility]  . KNEE SURGERY Bilateral 2015  . RADIOLOGY WITH ANESTHESIA N/A 02/20/2016   Procedure: EMBOLIZATION;  Surgeon: [name], MD;  Location: [place];  Service: Radiology;  Laterality: N/A;  . TOE SURGERY Left 2017   TOE NAIL REMOVAL  . TOTAL KNEE ARTHROPLASTY Right 07/20/2017   Procedure: TOTAL KNEE ARTHROPLASTY;  Surgeon: Lovell Sheehan, MD;  Location: [facility];  Service: Orthopedics;  Laterality: Right;    SOCIAL HISTORY: Social History   Socioeconomic History  . Marital status: Single    Spouse name: Not on file  . Number of children: Not on file  . Years of education: Not on file  . Highest education level: Not on file  Occupational History  . Occupation: retired [employer] operational officer  . Financial resource strain: Not on file  . Food insecurity    Worry: Not on file    Inability: Not on file  . Transportation needs    Medical: Not on file    Non-medical: Not on file  Tobacco Use  . Smoking status: Former Smoker    Packs/day: 1.00    Years: 2.00    Pack years: 2.00    Types: Cigarettes    Quit date: 05/05/1966    Years since quitting: 52.5  . Smokeless tobacco: Never Used  . Tobacco comment: quit age 64  Substance and Sexual Activity  . Alcohol use: Not Currently  . Drug use: No  . Sexual activity: Not Currently  Lifestyle  . Physical activity    Days per week: Not on file    Minutes per session: Not on file  . Stress: Not on file  Relationships  . Social Herbalist on phone: Not on file    Gets together: Not on file    Attends religious service: Not on file    Active member of club or organization: Not on file    Attends meetings of clubs or organizations: Not on file    Relationship status: Not on file  . Intimate partner  violence    Fear of current or ex partner: Not on file    Emotionally abused: Not on file    Physically abused: Not on file    Forced sexual activity: Not on file  Other Topics Concern  . Not on file  Social History Narrative  . Not on file    FAMILY HISTORY: Family History  Problem Relation Age of Onset  . Heart failure Father   . Diabetes Father   . Hypertension Father   . Alzheimer's disease Father 90  . Other Mother 7       homicide  . Alcohol abuse Mother   . Pancreatic cancer Sister   . Alcohol abuse Sister   . Diabetes Brother   . Diabetes Paternal Aunt   . Diabetes Paternal Grandmother   . Diabetes Paternal Grandfather  ALLERGIES:  is allergic to poison oak extract and poison ivy extract.  MEDICATIONS:  Current Outpatient Medications  Medication Sig Dispense Refill  . ACCU-CHEK GUIDE test strip USE 1 STRIP TO CHECK BLOOD SUGAR ONCE DAILY IN THE MORNING    . acetaminophen (TYLENOL) 500 MG tablet Take 500 mg by mouth every 6 (six) hours as needed.    Marland Kitchen amLODipine (NORVASC) 10 MG tablet Take 10 mg by mouth daily.    . Darolutamide (NUBEQA) 300 MG TABS Take 600 mg by mouth 2 (two) times daily with a meal. 120 tablet 3  . diclofenac sodium (VOLTAREN) 1 % GEL diclofenac 1 % topical gel    . levETIRAcetam (ROWEEPRA) 500 MG tablet Take 500 mg by mouth 2 (two) times daily.    Marland Kitchen losartan-hydrochlorothiazide (HYZAAR) 100-25 MG tablet Take 1 tablet by mouth daily.  1  . metFORMIN (GLUCOPHAGE) 500 MG tablet Take 500 mg by mouth 2 (two) times daily with a meal.    . metoprolol tartrate (LOPRESSOR) 100 MG tablet Take 100 mg by mouth 2 (two) times daily.    . rosuvastatin (CRESTOR) 5 MG tablet Take 5 mg by mouth at bedtime.     Marland Kitchen spironolactone (ALDACTONE) 100 MG tablet Take 100 mg by mouth 2 (two) times daily.    . tamsulosin (FLOMAX) 0.4 MG CAPS capsule Take 1 capsule (0.4 mg total) by mouth daily. 30 capsule 1   No current facility-administered medications for this  visit.      PHYSICAL EXAMINATION: ECOG PERFORMANCE STATUS: 1 - Symptomatic but completely ambulatory Vitals:   11/01/18 1321  BP: 106/68  Pulse: (!) 52  Temp: 98.3 F (36.8 C)   Filed Weights   11/01/18 1321  Weight: 276 lb 9 oz (125.4 kg)    Physical Exam Constitutional:      General: He is not in acute distress.    Appearance: He is obese.  HENT:     Head: Normocephalic and atraumatic.  Eyes:     General: No scleral icterus.    Pupils: Pupils are equal, round, and reactive to light.  Neck:     Musculoskeletal: Normal range of motion and neck supple.  Cardiovascular:     Rate and Rhythm: Normal rate and regular rhythm.     Heart sounds: Normal heart sounds.  Pulmonary:     Effort: Pulmonary effort is normal. No respiratory distress.     Breath sounds: No wheezing.  Abdominal:     General: Bowel sounds are normal. There is no distension.     Palpations: Abdomen is soft. There is no mass.     Tenderness: There is no abdominal tenderness.  Musculoskeletal: Normal range of motion.        General: No deformity.  Skin:    General: Skin is warm and dry.     Findings: No erythema or rash.  Neurological:     Mental Status: He is alert and oriented to person, place, and time.     Cranial Nerves: No cranial nerve deficit.     Coordination: Coordination normal.  Psychiatric:        Behavior: Behavior normal.        Thought Content: Thought content normal.      LABORATORY DATA:  I have reviewed the data as listed Lab Results  Component Value Date   WBC 5.8 11/01/2018   HGB 14.3 11/01/2018   HCT 41.2 11/01/2018   MCV 89.8 11/01/2018   PLT 192 11/01/2018   Recent  Labs    09/01/18 1037 09/24/18 0941 11/01/18 1252  NA 136 138 138  K 4.6 4.5 4.7  CL 102 105 104  CO2 26 25 24   GLUCOSE 118* 129* 115*  BUN 34* 25* 32*  CREATININE 1.84* 1.56* 1.59*  CALCIUM 9.7 9.5 9.7  GFRNONAA 36* 44* 43*  GFRAA 42* 51* 50*  PROT 7.5 7.5 7.5  ALBUMIN 4.3 4.3 4.2  AST 25  25 23   ALT 27 25 22   ALKPHOS 65 64 49  BILITOT 0.7 0.8 0.8    RADIOGRAPHIC STUDIES: I have personally reviewed the radiological images as listed and agreed with the findings in the report. 8/21/2020Bone scan showed uptake in the right knee surrounding prosthesis, degenerative type uptake at the shoulders, sternoclavicular joints, lumbar spine left knee and feet.  Also uptake in the mid sternum.  No definitive worrisome of sites of abnormal osseous tracer accumulation suggesting osseous metastatic disease. CT abdomen pelvis without contrast showed brachy therapy seeds in the prostate, no findings suspicious for metastatic disease.  ASSESSMENT & PLAN:   1. Prostate cancer ([place])   2. Encounter for antineoplastic chemotherapy   3. CKD (chronic kidney disease) stage 3, GFR 30-59 ml/min (HCC)   4. Androgen deprivation therapy   Prostate cancer, castration resistance. Continue Delalutin might 600 mg twice daily. Clinically tolerating well. PSA has trended down, today's level at 3.21.  Continue androgen deprivation therapy.  He is getting Lupron 30 mg x 1 today. Next due in 4 months.  CKD, creatinine stable.   No orders of the defined types were placed in this encounter.   We spent sufficient time to discuss many aspect of care, questions were answered to patient's satisfaction. Return of visit: 8 weeks    Earlie Server, MD, PhD Hematology Oncology [facility] Pager- [phone] 11/01/2018

## 2018-11-10 MED FILL — NUBEQA 300 MG TABS: 300 | 30 days supply | Qty: 120 | Fill #3

## 2018-11-12 ENCOUNTER — Telehealth: Payer: Self-pay | Admitting: Pharmacist

## 2018-11-12 NOTE — Telephone Encounter (Signed)
Oral Chemotherapy Pharmacist Encounter  Follow-Up Form  Called patient today to follow up regarding patient's oral chemotherapy medication: Nubeqa (daralutamide)  Original Start date of oral chemotherapy: 08/2018  Pt reports 0 tablets/doses of Nubeqa missed in the last 4 weeks.   Pt reports the following side effects: None reported  Recent labs reviewed: PSA from 11/01/2018  New medications?: None reported  Other Issues: None reported  Patient knows to call the office with questions or concerns. Oral Oncology Clinic will continue to follow.  Darl Pikes, PharmD, BCPS, [facility] Hematology/Oncology Clinical Pharmacist [facility] [place] [phone]  11/12/2018 3:33 PM

## 2018-12-03 ENCOUNTER — Other Ambulatory Visit: Payer: Self-pay | Admitting: Oncology

## 2018-12-03 DIAGNOSIS — C61 Malignant neoplasm of prostate: Secondary | ICD-10-CM

## 2018-12-06 MED FILL — NUBEQA 300 MG TABS: 300 | 30 days supply | Qty: 120 | Fill #0

## 2018-12-27 ENCOUNTER — Encounter: Payer: Self-pay | Admitting: Oncology

## 2018-12-27 ENCOUNTER — Other Ambulatory Visit: Payer: Self-pay

## 2018-12-27 ENCOUNTER — Inpatient Hospital Stay (HOSPITAL_BASED_OUTPATIENT_CLINIC_OR_DEPARTMENT_OTHER): Payer: Medicare HMO | Admitting: Oncology

## 2018-12-27 ENCOUNTER — Inpatient Hospital Stay: Payer: Medicare HMO | Attending: Oncology

## 2018-12-27 VITALS — BP 108/72 | HR 52 | Temp 97.5°F | Resp 18 | Wt 275.0 lb

## 2018-12-27 DIAGNOSIS — N189 Chronic kidney disease, unspecified: Secondary | ICD-10-CM | POA: Diagnosis not present

## 2018-12-27 DIAGNOSIS — I129 Hypertensive chronic kidney disease with stage 1 through stage 4 chronic kidney disease, or unspecified chronic kidney disease: Secondary | ICD-10-CM | POA: Diagnosis not present

## 2018-12-27 DIAGNOSIS — C61 Malignant neoplasm of prostate: Secondary | ICD-10-CM

## 2018-12-27 DIAGNOSIS — R972 Elevated prostate specific antigen [PSA]: Secondary | ICD-10-CM

## 2018-12-27 DIAGNOSIS — Z79818 Long term (current) use of other agents affecting estrogen receptors and estrogen levels: Secondary | ICD-10-CM

## 2018-12-27 DIAGNOSIS — Z8673 Personal history of transient ischemic attack (TIA), and cerebral infarction without residual deficits: Secondary | ICD-10-CM | POA: Insufficient documentation

## 2018-12-27 DIAGNOSIS — Z8249 Family history of ischemic heart disease and other diseases of the circulatory system: Secondary | ICD-10-CM | POA: Insufficient documentation

## 2018-12-27 DIAGNOSIS — N183 Chronic kidney disease, stage 3 unspecified: Secondary | ICD-10-CM

## 2018-12-27 DIAGNOSIS — Z5111 Encounter for antineoplastic chemotherapy: Secondary | ICD-10-CM | POA: Diagnosis not present

## 2018-12-27 DIAGNOSIS — R569 Unspecified convulsions: Secondary | ICD-10-CM | POA: Insufficient documentation

## 2018-12-27 DIAGNOSIS — M549 Dorsalgia, unspecified: Secondary | ICD-10-CM | POA: Diagnosis not present

## 2018-12-27 DIAGNOSIS — G8929 Other chronic pain: Secondary | ICD-10-CM | POA: Insufficient documentation

## 2018-12-27 DIAGNOSIS — IMO0001 Reserved for inherently not codable concepts without codable children: Secondary | ICD-10-CM

## 2018-12-27 DIAGNOSIS — E785 Hyperlipidemia, unspecified: Secondary | ICD-10-CM | POA: Diagnosis not present

## 2018-12-27 DIAGNOSIS — Z7984 Long term (current) use of oral hypoglycemic drugs: Secondary | ICD-10-CM | POA: Insufficient documentation

## 2018-12-27 DIAGNOSIS — Z79899 Other long term (current) drug therapy: Secondary | ICD-10-CM | POA: Diagnosis not present

## 2018-12-27 DIAGNOSIS — Z791 Long term (current) use of non-steroidal anti-inflammatories (NSAID): Secondary | ICD-10-CM | POA: Diagnosis not present

## 2018-12-27 DIAGNOSIS — E1122 Type 2 diabetes mellitus with diabetic chronic kidney disease: Secondary | ICD-10-CM | POA: Diagnosis not present

## 2018-12-27 DIAGNOSIS — Z833 Family history of diabetes mellitus: Secondary | ICD-10-CM | POA: Insufficient documentation

## 2018-12-27 DIAGNOSIS — Z87891 Personal history of nicotine dependence: Secondary | ICD-10-CM | POA: Insufficient documentation

## 2018-12-27 LAB — CBC WITH DIFFERENTIAL/PLATELET
Abs Immature Granulocytes: 0.01 10*3/uL (ref 0.00–0.07)
Basophils Absolute: 0 10*3/uL (ref 0.0–0.1)
Basophils Relative: 1 %
Eosinophils Absolute: 0.3 10*3/uL (ref 0.0–0.5)
Eosinophils Relative: 6 %
HCT: 40.6 % (ref 39.0–52.0)
Hemoglobin: 14 g/dL (ref 13.0–17.0)
Immature Granulocytes: 0 %
Lymphocytes Relative: 28 %
Lymphs Abs: 1.4 10*3/uL (ref 0.7–4.0)
MCH: 30.8 pg (ref 26.0–34.0)
MCHC: 34.5 g/dL (ref 30.0–36.0)
MCV: 89.2 fL (ref 80.0–100.0)
Monocytes Absolute: 0.7 10*3/uL (ref 0.1–1.0)
Monocytes Relative: 15 %
Neutro Abs: 2.6 10*3/uL (ref 1.7–7.7)
Neutrophils Relative %: 50 %
Platelets: 194 10*3/uL (ref 150–400)
RBC: 4.55 MIL/uL (ref 4.22–5.81)
RDW: 12.2 % (ref 11.5–15.5)
WBC: 5 10*3/uL (ref 4.0–10.5)
nRBC: 0 % (ref 0.0–0.2)

## 2018-12-27 LAB — COMPREHENSIVE METABOLIC PANEL
ALT: 20 U/L (ref 0–44)
AST: 20 U/L (ref 15–41)
Albumin: 4.1 g/dL (ref 3.5–5.0)
Alkaline Phosphatase: 46 U/L (ref 38–126)
Anion gap: 10 (ref 5–15)
BUN: 25 mg/dL — ABNORMAL HIGH (ref 8–23)
CO2: 26 mmol/L (ref 22–32)
Calcium: 9.7 mg/dL (ref 8.9–10.3)
Chloride: 103 mmol/L (ref 98–111)
Creatinine, Ser: 1.52 mg/dL — ABNORMAL HIGH (ref 0.61–1.24)
GFR calc Af Amer: 52 mL/min — ABNORMAL LOW (ref >60)
GFR calc non Af Amer: 45 mL/min — ABNORMAL LOW (ref >60)
Glucose, Bld: 108 mg/dL — ABNORMAL HIGH (ref 70–99)
Potassium: 4.6 mmol/L (ref 3.5–5.1)
Sodium: 139 mmol/L (ref 135–145)
Total Bilirubin: 0.7 mg/dL (ref 0.3–1.2)
Total Protein: 7.6 g/dL (ref 6.5–8.1)

## 2018-12-27 LAB — PSA: Prostatic Specific Antigen: 2.17 ng/mL (ref 0.00–4.00)

## 2018-12-27 NOTE — Progress Notes (Signed)
Patient does not offer any problems today.  

## 2018-12-27 NOTE — Progress Notes (Signed)
Hematology/Oncology follow up note [facility] Telephone:(336) [phone] Fax:(336) [phone]   Patient Care Team: [name], MD as PCP - General (Internal Medicine) [name], Ronda Fairly, MD (Urology)  REFERRING PROVIDER: Dr.Stoioff  REASON FOR VISIT:  Management of prostate cancer  HISTORY OF PRESENTING ILLNESS:  Timothy Maddox is a  73 y.o.  male with PMH listed below who was referred to me for evaluation of prostate cancer.  Prostate cancer diagnosis dated back to 2013.  I do  Not have his previous oncology medical records. Per Dr.Stoioff, he completed IMRT in February 2014 followed by androgen deprivation therapy. Patient previously followed Dr. Marian Sorrow at [facility] urology and then [place] in [place].  Recently he was seen by Dr. John Giovanni at [facility] urology.  Per urology notes, patient was diagnosed with a Gleason score 5+4 with 100% involvement in 6 out of 6 cores on the left and a 3 out of 6 cores on the right.  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  Bone scan was normal at that time.   Patient was treated with Lupron for about 18 months. PSA starts to rise.  August 2018 PSA was 9.3.  Patient had a CT and bone scan done was negative for metastatic disease.  Testosterone level was 53 in August 2018.  Patient was restarted on Lupron injection every 3 months since then.  December 2018 PSA decreased to 4.9 after ADT restarted. March 2019 PSA 5.4 Testosterone was checked in June 2019 within castration level.  Patient reports chronic back pain.  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  History of seizure, takes Roweepra 500 twice daily, Topiramate 5mg  BID. Marland Kitchen  Daughter accompanies patient to today's visit.  INTERVAL HISTORY Timothy Maddox is a 73 y.o. 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  Patient reports feeling well today.  Denies any new complaints Denies any fever, chills, nausea, vomiting, new bone pain, chest pain, or abdominal pain .  Denies any diarrhea Patient is taking darolutimide 600 mg twice daily since 08/20/2018. Denies experiencing any side effects. Chronic back pain unchanged. . Review of Systems  Constitutional: Negative for chills, fever, malaise/fatigue and weight loss.  HENT: Negative for sore throat.   Eyes: Negative for redness.  Respiratory: Negative for cough, shortness of breath and wheezing.   Cardiovascular: Negative for chest pain, palpitations and leg swelling.  Gastrointestinal: Negative for abdominal pain, blood in stool, nausea and vomiting.  Genitourinary: Negative for dysuria.  Musculoskeletal: Positive for back pain. Negative for myalgias.  Skin: Negative for rash.  Neurological: Negative for dizziness, tingling and tremors.  Endo/Heme/Allergies: Does not bruise/bleed easily.  Psychiatric/Behavioral: Negative for hallucinations.    MEDICAL HISTORY:  Past Medical History:  Diagnosis Date  . Arthritis    lower back, shoulders  . AVM (arteriovenous malformation) brain   . Cancer [facility])    prostate- treated with radiation  . CVA (cerebral infarction) 11/2015  . Diabetes mellitus without complication ([place])   . Dyspnea    with exertion  . HOH (hard of hearing)   . Hyperlipidemia   . Hypertension   . Pneumonia    1st grade  . Pre-diabetes   . Prediabetes   . Seizures ([place]) 12/2015  . Stroke [facility])    speech impairment, no weakness    SURGICAL HISTORY: Past Surgical History:  Procedure Laterality Date  .  BACK SURGERY  1999   disectomy  . BACK SURGERY     2 surgery- injury  . COLONOSCOPY W/ POLYPECTOMY    . HAMMER TOE SURGERY Bilateral   . INGUINAL HERNIA REPAIR Right    per patient when he was in 1st grade  . IR GENERIC HISTORICAL  12/05/2015   IR RADIOLOGIST EVAL & MGMT  12/05/2015 [facility]  . IR GENERIC HISTORICAL  01/31/2016   IR RADIOLOGIST EVAL & MGMT 01/31/2016 [facility]  . IR GENERIC HISTORICAL  02/20/2016   IR ANGIO INTRA EXTRACRAN SEL COM CAROTID INNOMINATE UNI L MOD SED 02/20/2016 [name], MD [facility]  . IR GENERIC HISTORICAL  02/20/2016   IR 3D INDEPENDENT WKST 02/20/2016 [name], MD [facility]  . KNEE SURGERY Bilateral 2015  . RADIOLOGY WITH ANESTHESIA N/A 02/20/2016   Procedure: EMBOLIZATION;  Surgeon: [name], MD;  Location: [place];  Service: Radiology;  Laterality: N/A;  . TOE SURGERY Left 2017   TOE NAIL REMOVAL  . TOTAL KNEE ARTHROPLASTY Right 07/20/2017   Procedure: TOTAL KNEE ARTHROPLASTY;  Surgeon: Lovell Sheehan, MD;  Location: [facility];  Service: Orthopedics;  Laterality: Right;    SOCIAL HISTORY: Social History   Socioeconomic History  . Marital status: Single    Spouse name: Not on file  . Number of children: Not on file  . Years of education: Not on file  . Highest education level: Not on file  Occupational History  . Occupation: retired [employer] operational officer  . Financial resource strain: Not on file  . Food insecurity    Worry: Not on file    Inability: Not on file  . Transportation needs    Medical: Not on file    Non-medical: Not on file  Tobacco Use  . Smoking status: Former Smoker    Packs/day: 1.00    Years: 2.00    Pack years: 2.00    Types: Cigarettes    Quit date: 05/05/1966    Years since quitting: 52.6  . Smokeless tobacco: Never Used  . Tobacco comment: quit age 52  Substance and Sexual Activity  . Alcohol use: Not Currently  . Drug use: No  . Sexual activity: Not Currently  Lifestyle  . Physical activity    Days per week: Not on file    Minutes per session: Not on file  . Stress: Not on file  Relationships  . Social Herbalist on phone: Not on file    Gets together: Not on file    Attends religious service: Not on file    Active member of  club or organization: Not on file    Attends meetings of clubs or organizations: Not on file    Relationship status: Not on file  . Intimate partner violence    Fear of current or ex partner: Not on file    Emotionally abused: Not on file    Physically abused: Not on file    Forced sexual activity: Not on file  Other Topics Concern  . Not on file  Social History Narrative  . Not on file    FAMILY HISTORY: Family History  Problem Relation Age of Onset  . Heart failure Father   . Diabetes Father   . Hypertension Father   . Alzheimer's disease Father 82  . Other Mother 58       homicide  . Alcohol abuse Mother   . Pancreatic cancer Sister   . Alcohol  abuse Sister   . Diabetes Brother   . Diabetes Paternal Aunt   . Diabetes Paternal Grandmother   . Diabetes Paternal Grandfather     ALLERGIES:  is allergic to poison oak extract and poison ivy extract.  MEDICATIONS:  Current Outpatient Medications  Medication Sig Dispense Refill  . ACCU-CHEK GUIDE test strip USE 1 STRIP TO CHECK BLOOD SUGAR ONCE DAILY IN THE MORNING    . acetaminophen (TYLENOL) 500 MG tablet Take 500 mg by mouth every 6 (six) hours as needed.    Marland Kitchen amLODipine (NORVASC) 10 MG tablet Take 10 mg by mouth daily.    Marland Kitchen levETIRAcetam (ROWEEPRA) 500 MG tablet Take 500 mg by mouth 2 (two) times daily.    Marland Kitchen losartan-hydrochlorothiazide (HYZAAR) 100-25 MG tablet Take 1 tablet by mouth daily.  1  . metFORMIN (GLUCOPHAGE) 500 MG tablet Take 500 mg by mouth 2 (two) times daily with a meal.    . metoprolol tartrate (LOPRESSOR) 100 MG tablet Take 100 mg by mouth 2 (two) times daily.    . NUBEQA 300 MG TABS TAKE 2 TABLETS (600MG ) BY MOUTH TWO TIMES DAILY WITH A MEAL. 120 tablet 3  . rosuvastatin (CRESTOR) 5 MG tablet Take 5 mg by mouth at bedtime.     Marland Kitchen spironolactone (ALDACTONE) 100 MG tablet Take 100 mg by mouth 2 (two) times daily.    . tamsulosin (FLOMAX) 0.4 MG CAPS capsule Take 1 capsule (0.4 mg total) by mouth daily. 30  capsule 1  . diclofenac sodium (VOLTAREN) 1 % GEL diclofenac 1 % topical gel     No current facility-administered medications for this visit.      PHYSICAL EXAMINATION: ECOG PERFORMANCE STATUS: 1 - Symptomatic but completely ambulatory Vitals:   12/27/18 1408  BP: 108/72  Pulse: (!) 52  Resp: 18  Temp: (!) 97.5 F (36.4 C)   Filed Weights   12/27/18 1408  Weight: 275 lb (124.7 kg)    Physical Exam Constitutional:      General: He is not in acute distress.    Appearance: He is obese.  HENT:     Head: Normocephalic and atraumatic.  Eyes:     General: No scleral icterus.    Pupils: Pupils are equal, round, and reactive to light.  Neck:     Musculoskeletal: Normal range of motion and neck supple.  Cardiovascular:     Rate and Rhythm: Normal rate and regular rhythm.     Heart sounds: Normal heart sounds.  Pulmonary:     Effort: Pulmonary effort is normal. No respiratory distress.     Breath sounds: No wheezing.  Abdominal:     General: Bowel sounds are normal. There is no distension.     Palpations: Abdomen is soft. There is no mass.     Tenderness: There is no abdominal tenderness.  Musculoskeletal: Normal range of motion.        General: No deformity.  Skin:    General: Skin is warm and dry.     Findings: No erythema or rash.  Neurological:     Mental Status: He is alert and oriented to person, place, and time.     Cranial Nerves: No cranial nerve deficit.     Coordination: Coordination normal.  Psychiatric:        Behavior: Behavior normal.        Thought Content: Thought content normal.      LABORATORY DATA:  I have reviewed the data as listed Lab Results  Component  Value Date   WBC 5.0 12/27/2018   HGB 14.0 12/27/2018   HCT 40.6 12/27/2018   MCV 89.2 12/27/2018   PLT 194 12/27/2018   Recent Labs    09/24/18 0941 11/01/18 1252 12/27/18 1331  NA 138 138 139  K 4.5 4.7 4.6  CL 105 104 103  CO2 25 24 26   GLUCOSE 129* 115* 108*  BUN 25* 32*  25*  CREATININE 1.56* 1.59* 1.52*  CALCIUM 9.5 9.7 9.7  GFRNONAA 44* 43* 45*  GFRAA 51* 50* 52*  PROT 7.5 7.5 7.6  ALBUMIN 4.3 4.2 4.1  AST 25 23 20   ALT 25 22 20   ALKPHOS 64 49 46  BILITOT 0.8 0.8 0.7    RADIOGRAPHIC STUDIES: I have personally reviewed the radiological images as listed and agreed with the findings in the report. 8/21/2020Bone scan showed uptake in the right knee surrounding prosthesis, degenerative type uptake at the shoulders, sternoclavicular joints, lumbar spine left knee and feet.  Also uptake in the mid sternum.  No definitive worrisome of sites of abnormal osseous tracer accumulation suggesting osseous metastatic disease. CT abdomen pelvis without contrast showed brachy therapy seeds in the prostate, no findings suspicious for metastatic disease.  ASSESSMENT & PLAN:   1. Prostate cancer ([place])   2. Encounter for antineoplastic chemotherapy   3. Androgen deprivation therapy   Prostate cancer, biochemical recurrence, castration resistance. Tolerating darolutimide  600 mg twice daily. Continue. PSA has trended down.  11/01/2018 PSA normalized to 3.21.  Today's PSA level is pending. Continue androgen deprivation therapy.  Last Lupron 30 mg 11/11/2017.  Due in October 2020 . CKD, creatinine stable.   Orders Placed This Encounter  Procedures  . PSA    Standing Status:   Standing    Number of Occurrences:   20    Standing Expiration Date:   12/27/2019    We spent sufficient time to discuss many aspect of care, questions were answered to patient's satisfaction. Return of visit: 8 weeks    Earlie Server, MD, PhD Hematology Oncology [facility] Pager- [phone] 12/27/2018

## 2019-01-03 MED FILL — NUBEQA 300 MG TABS: 300 | 30 days supply | Qty: 120 | Fill #1

## 2019-02-03 MED FILL — NUBEQA 300 MG TABS: 300 | 30 days supply | Qty: 120 | Fill #2

## 2019-02-21 ENCOUNTER — Inpatient Hospital Stay (HOSPITAL_BASED_OUTPATIENT_CLINIC_OR_DEPARTMENT_OTHER): Payer: Medicare HMO | Admitting: Oncology

## 2019-02-21 ENCOUNTER — Other Ambulatory Visit: Payer: Self-pay

## 2019-02-21 ENCOUNTER — Inpatient Hospital Stay: Payer: Medicare HMO

## 2019-02-21 ENCOUNTER — Encounter: Payer: Self-pay | Admitting: Oncology

## 2019-02-21 ENCOUNTER — Inpatient Hospital Stay: Payer: Medicare HMO | Attending: Oncology

## 2019-02-21 VITALS — BP 128/73 | HR 51 | Temp 96.9°F | Resp 18 | Wt 279.0 lb

## 2019-02-21 DIAGNOSIS — Z5111 Encounter for antineoplastic chemotherapy: Secondary | ICD-10-CM | POA: Insufficient documentation

## 2019-02-21 DIAGNOSIS — Z791 Long term (current) use of non-steroidal anti-inflammatories (NSAID): Secondary | ICD-10-CM | POA: Diagnosis not present

## 2019-02-21 DIAGNOSIS — Z8673 Personal history of transient ischemic attack (TIA), and cerebral infarction without residual deficits: Secondary | ICD-10-CM | POA: Diagnosis not present

## 2019-02-21 DIAGNOSIS — N1831 Chronic kidney disease, stage 3a: Secondary | ICD-10-CM | POA: Insufficient documentation

## 2019-02-21 DIAGNOSIS — Z8249 Family history of ischemic heart disease and other diseases of the circulatory system: Secondary | ICD-10-CM | POA: Insufficient documentation

## 2019-02-21 DIAGNOSIS — M549 Dorsalgia, unspecified: Secondary | ICD-10-CM | POA: Diagnosis not present

## 2019-02-21 DIAGNOSIS — R7303 Prediabetes: Secondary | ICD-10-CM | POA: Insufficient documentation

## 2019-02-21 DIAGNOSIS — Z79818 Long term (current) use of other agents affecting estrogen receptors and estrogen levels: Secondary | ICD-10-CM

## 2019-02-21 DIAGNOSIS — I129 Hypertensive chronic kidney disease with stage 1 through stage 4 chronic kidney disease, or unspecified chronic kidney disease: Secondary | ICD-10-CM | POA: Diagnosis not present

## 2019-02-21 DIAGNOSIS — G8929 Other chronic pain: Secondary | ICD-10-CM | POA: Diagnosis not present

## 2019-02-21 DIAGNOSIS — R972 Elevated prostate specific antigen [PSA]: Secondary | ICD-10-CM

## 2019-02-21 DIAGNOSIS — Z87891 Personal history of nicotine dependence: Secondary | ICD-10-CM | POA: Diagnosis not present

## 2019-02-21 DIAGNOSIS — C61 Malignant neoplasm of prostate: Secondary | ICD-10-CM

## 2019-02-21 DIAGNOSIS — R569 Unspecified convulsions: Secondary | ICD-10-CM

## 2019-02-21 DIAGNOSIS — E785 Hyperlipidemia, unspecified: Secondary | ICD-10-CM | POA: Insufficient documentation

## 2019-02-21 DIAGNOSIS — Z7984 Long term (current) use of oral hypoglycemic drugs: Secondary | ICD-10-CM | POA: Insufficient documentation

## 2019-02-21 DIAGNOSIS — Z79899 Other long term (current) drug therapy: Secondary | ICD-10-CM | POA: Insufficient documentation

## 2019-02-21 DIAGNOSIS — R9721 Rising PSA following treatment for malignant neoplasm of prostate: Secondary | ICD-10-CM | POA: Diagnosis not present

## 2019-02-21 LAB — CBC WITH DIFFERENTIAL/PLATELET
Abs Immature Granulocytes: 0.01 10*3/uL (ref 0.00–0.07)
Basophils Absolute: 0 10*3/uL (ref 0.0–0.1)
Basophils Relative: 1 %
Eosinophils Absolute: 0.3 10*3/uL (ref 0.0–0.5)
Eosinophils Relative: 6 %
HCT: 40.8 % (ref 39.0–52.0)
Hemoglobin: 14.1 g/dL (ref 13.0–17.0)
Immature Granulocytes: 0 %
Lymphocytes Relative: 23 %
Lymphs Abs: 1 10*3/uL (ref 0.7–4.0)
MCH: 30.9 pg (ref 26.0–34.0)
MCHC: 34.6 g/dL (ref 30.0–36.0)
MCV: 89.5 fL (ref 80.0–100.0)
Monocytes Absolute: 0.6 10*3/uL (ref 0.1–1.0)
Monocytes Relative: 14 %
Neutro Abs: 2.6 10*3/uL (ref 1.7–7.7)
Neutrophils Relative %: 56 %
Platelets: 179 10*3/uL (ref 150–400)
RBC: 4.56 MIL/uL (ref 4.22–5.81)
RDW: 12.3 % (ref 11.5–15.5)
WBC: 4.5 10*3/uL (ref 4.0–10.5)
nRBC: 0 % (ref 0.0–0.2)

## 2019-02-21 LAB — COMPREHENSIVE METABOLIC PANEL
ALT: 26 U/L (ref 0–44)
AST: 24 U/L (ref 15–41)
Albumin: 4 g/dL (ref 3.5–5.0)
Alkaline Phosphatase: 43 U/L (ref 38–126)
Anion gap: 7 (ref 5–15)
BUN: 22 mg/dL (ref 8–23)
CO2: 28 mmol/L (ref 22–32)
Calcium: 9.5 mg/dL (ref 8.9–10.3)
Chloride: 103 mmol/L (ref 98–111)
Creatinine, Ser: 1.28 mg/dL — ABNORMAL HIGH (ref 0.61–1.24)
GFR calc Af Amer: >60 mL/min (ref >60)
GFR calc non Af Amer: 56 mL/min — ABNORMAL LOW (ref >60)
Glucose, Bld: 118 mg/dL — ABNORMAL HIGH (ref 70–99)
Potassium: 4.3 mmol/L (ref 3.5–5.1)
Sodium: 138 mmol/L (ref 135–145)
Total Bilirubin: 0.8 mg/dL (ref 0.3–1.2)
Total Protein: 7.2 g/dL (ref 6.5–8.1)

## 2019-02-21 LAB — PSA: Prostatic Specific Antigen: 1.3 ng/mL (ref 0.00–4.00)

## 2019-02-21 MED ORDER — LEUPROLIDE ACETATE (3 MONTH) 22.5 MG IM KIT
22.5000 mg | PACK | Freq: Once | INTRAMUSCULAR | Status: DC
  Filled 2019-02-21: qty 22.5

## 2019-02-21 MED ORDER — TAMSULOSIN HCL 0.4 MG PO CAPS: 0.4000 mg | ORAL_CAPSULE | Freq: Every day | ORAL | 3 refills | Status: DC

## 2019-02-21 MED ORDER — LEUPROLIDE ACETATE (3 MONTH) 22.5 MG ~~LOC~~ KIT
22.5000 mg | PACK | Freq: Once | SUBCUTANEOUS | Status: AC
  Administered 2019-02-21: 11:00:00 22.5 mg via SUBCUTANEOUS
  Filled 2019-02-21: qty 22.5

## 2019-02-21 NOTE — Progress Notes (Signed)
Patient here for follow up. No concerns voiced.  °

## 2019-02-21 NOTE — Progress Notes (Signed)
Hematology/Oncology follow up note [facility] [phone] [phone]   Patient Care Team: [name], MD as PCP - General (Internal Medicine) [name], Ronda Fairly, MD (Urology)  REFERRING PROVIDER: Dr.Stoioff  REASON FOR VISIT:  Management of prostate cancer  HISTORY OF PRESENTING ILLNESS:  Timothy Maddox is a  73 y.o.  male with PMH listed below who was referred to me for evaluation of prostate cancer.  Prostate cancer diagnosis dated back to 2013.  I do  Not have his previous oncology medical records. Per Dr.Stoioff, he completed IMRT in February 2014 followed by androgen deprivation therapy. Patient previously followed Dr. Marian Sorrow at [facility] urology and then [place] in [place].  Recently he was seen by Dr. John Giovanni at [facility] urology.  Per urology notes, patient was diagnosed with a Gleason score 5+4 with 100% involvement in 6 out of 6 cores on the left and a 3 out of 6 cores on the right.  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  Bone scan was normal at that time.   Patient was treated with Lupron for about 18 months. PSA starts to rise.  August 2018 PSA was 9.3.  Patient had a CT and bone scan done was negative for metastatic disease.  Testosterone level was 53 in August 2018.  Patient was restarted on Lupron injection every 3 months since then.  December 2018 PSA decreased to 4.9 after ADT restarted. March 2019 PSA 5.4 Testosterone was checked in June 2019 within castration level.  Patient reports chronic back pain.  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  History of seizure, takes Roweepra 500 twice daily, Topiramate 5mg  BID. Marland Kitchen  Daughter accompanies patient to today's visit.  INTERVAL HISTORY Timothy Maddox is a 73 y.o. 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  Patient reports feeling well at baseline today.  Denies any new complaints. Denies fever, chills, nausea, vomiting, diarrhea, chest pain, shortness of breath, abdominal pain, urinary symptoms, lower extremity swelling.  Chronic back pain unchanged. Patient is taking darolutamide 600 mg twice daily since April 2020.  Does not experience any side effects Has chronic seizure disorder, not actively following up with neurology.  No recent seizure activities  Review of Systems  Constitutional: Negative for chills, fever, malaise/fatigue and weight loss.  HENT: Negative for sore throat.   Eyes: Negative for redness.  Respiratory: Negative for cough, shortness of breath and wheezing.   Cardiovascular: Negative for chest pain, palpitations and leg swelling.  Gastrointestinal: Negative for abdominal pain, blood in stool, nausea and vomiting.  Genitourinary: Negative for dysuria.  Musculoskeletal: Positive for back pain. Negative for myalgias.  Skin: Negative for rash.  Neurological: Negative for dizziness, tingling and tremors.  Endo/Heme/Allergies: Does not bruise/bleed easily.  Psychiatric/Behavioral: Negative for hallucinations.    MEDICAL HISTORY:  Past Medical History:  Diagnosis Date  . Arthritis    lower back, shoulders  . AVM (arteriovenous malformation) brain   . Cancer [facility])    prostate- treated with radiation  . CVA (cerebral infarction) 11/2015  . Diabetes mellitus without complication ([place])   . Dyspnea    with exertion  . HOH (hard of hearing)   . Hyperlipidemia   . Hypertension   . Pneumonia    1st grade  . Pre-diabetes   . Prediabetes   . Seizures ([place]) 12/2015  . Stroke [facility])  speech impairment, no weakness    SURGICAL HISTORY: Past Surgical History:  Procedure Laterality Date  . BACK SURGERY  1999   disectomy  . BACK SURGERY     2 surgery- injury  . COLONOSCOPY W/ POLYPECTOMY    . HAMMER TOE SURGERY  Bilateral   . INGUINAL HERNIA REPAIR Right    per patient when he was in 1st grade  . IR GENERIC HISTORICAL  12/05/2015   IR RADIOLOGIST EVAL & MGMT 12/05/2015 [facility]  . IR GENERIC HISTORICAL  01/31/2016   IR RADIOLOGIST EVAL & MGMT 01/31/2016 [facility]  . IR GENERIC HISTORICAL  02/20/2016   IR ANGIO INTRA EXTRACRAN SEL COM CAROTID INNOMINATE UNI L MOD SED 02/20/2016 [name], MD [facility]  . IR GENERIC HISTORICAL  02/20/2016   IR 3D INDEPENDENT WKST 02/20/2016 [name], MD [facility]  . KNEE SURGERY Bilateral 2015  . RADIOLOGY WITH ANESTHESIA N/A 02/20/2016   Procedure: EMBOLIZATION;  Surgeon: [name], MD;  Location: [facility];  Service: Radiology;  Laterality: N/A;  . TOE SURGERY Left 2017   TOE NAIL REMOVAL  . TOTAL KNEE ARTHROPLASTY Right 07/20/2017   Procedure: TOTAL KNEE ARTHROPLASTY;  Surgeon: Lovell Sheehan, MD;  Location: [facility];  Service: Orthopedics;  Laterality: Right;    SOCIAL HISTORY: Social History   Socioeconomic History  . Marital status: Single    Spouse name: Not on file  . Number of children: Not on file  . Years of education: Not on file  . Highest education level: Not on file  Occupational History  . Occupation: retired [employer] operational officer  . Financial resource strain: Not on file  . Food insecurity    Worry: Not on file    Inability: Not on file  . Transportation needs    Medical: Not on file    Non-medical: Not on file  Tobacco Use  . Smoking status: Former Smoker    Packs/day: 1.00    Years: 2.00    Pack years: 2.00    Types: Cigarettes    Quit date: 05/05/1966    Years since quitting: 52.8  . Smokeless tobacco: Never Used  . Tobacco comment: quit age 40  Substance and Sexual Activity  . Alcohol use: Not Currently  . Drug use: No  . Sexual activity: Not Currently  Lifestyle  . Physical activity    Days per week: Not on file    Minutes per session: Not on file  . Stress: Not on file  Relationships   . Social Herbalist on phone: Not on file    Gets together: Not on file    Attends religious service: Not on file    Active member of club or organization: Not on file    Attends meetings of clubs or organizations: Not on file    Relationship status: Not on file  . Intimate partner violence    Fear of current or ex partner: Not on file    Emotionally abused: Not on file    Physically abused: Not on file    Forced sexual activity: Not on file  Other Topics Concern  . Not on file  Social History Narrative  . Not on file    FAMILY HISTORY: Family History  Problem Relation Age of Onset  . Heart failure Father   . Diabetes Father   . Hypertension Father   . Alzheimer's disease Father 3  . Other Mother 47  homicide  . Alcohol abuse Mother   . Pancreatic cancer Sister   . Alcohol abuse Sister   . Diabetes Brother   . Diabetes Paternal Aunt   . Diabetes Paternal Grandmother   . Diabetes Paternal Grandfather     ALLERGIES:  is allergic to poison oak extract and poison ivy extract.  MEDICATIONS:  Current Outpatient Medications  Medication Sig Dispense Refill  . ACCU-CHEK GUIDE test strip USE 1 STRIP TO CHECK BLOOD SUGAR ONCE DAILY IN THE MORNING    . acetaminophen (TYLENOL) 500 MG tablet Take 500 mg by mouth every 6 (six) hours as needed.    Marland Kitchen amLODipine (NORVASC) 10 MG tablet Take 10 mg by mouth daily.    . diclofenac sodium (VOLTAREN) 1 % GEL diclofenac 1 % topical gel    . levETIRAcetam (ROWEEPRA) 500 MG tablet Take 500 mg by mouth 2 (two) times daily.    Marland Kitchen losartan-hydrochlorothiazide (HYZAAR) 100-25 MG tablet Take 1 tablet by mouth daily.  1  . metFORMIN (GLUCOPHAGE) 500 MG tablet Take 500 mg by mouth 2 (two) times daily with a meal.    . metoprolol tartrate (LOPRESSOR) 100 MG tablet Take 100 mg by mouth 2 (two) times daily.    . NUBEQA 300 MG TABS TAKE 2 TABLETS (600MG ) BY MOUTH TWO TIMES DAILY WITH A MEAL. 120 tablet 3  . rosuvastatin (CRESTOR) 5 MG  tablet Take 5 mg by mouth at bedtime.     Marland Kitchen spironolactone (ALDACTONE) 100 MG tablet Take 100 mg by mouth 2 (two) times daily.    . tamsulosin (FLOMAX) 0.4 MG CAPS capsule Take 1 capsule (0.4 mg total) by mouth daily. 30 capsule 1   No current facility-administered medications for this visit.      PHYSICAL EXAMINATION: ECOG PERFORMANCE STATUS: 1 - Symptomatic but completely ambulatory Vitals:   02/21/19 0958  BP: 128/73  Pulse: (!) 51  Resp: 18  Temp: (!) 96.9 F (36.1 C)   Filed Weights   02/21/19 0958  Weight: 279 lb (126.6 kg)    Physical Exam Constitutional:      General: He is not in acute distress.    Appearance: He is obese.  HENT:     Head: Normocephalic and atraumatic.  Eyes:     General: No scleral icterus.    Pupils: Pupils are equal, round, and reactive to light.  Neck:     Musculoskeletal: Normal range of motion and neck supple.  Cardiovascular:     Rate and Rhythm: Normal rate and regular rhythm.     Heart sounds: Normal heart sounds.  Pulmonary:     Effort: Pulmonary effort is normal. No respiratory distress.     Breath sounds: No wheezing.  Abdominal:     General: Bowel sounds are normal. There is no distension.     Palpations: Abdomen is soft. There is no mass.     Tenderness: There is no abdominal tenderness.  Musculoskeletal: Normal range of motion.        General: No deformity.  Skin:    General: Skin is warm and dry.     Findings: No erythema or rash.  Neurological:     Mental Status: He is alert and oriented to person, place, and time.     Cranial Nerves: No cranial nerve deficit.     Coordination: Coordination normal.  Psychiatric:        Behavior: Behavior normal.        Thought Content: Thought content normal.  LABORATORY DATA:  I have reviewed the data as listed Lab Results  Component Value Date   WBC 4.5 02/21/2019   HGB 14.1 02/21/2019   HCT 40.8 02/21/2019   MCV 89.5 02/21/2019   PLT 179 02/21/2019   Recent Labs     11/01/18 1252 12/27/18 1331 02/21/19 0937  NA 138 139 138  K 4.7 4.6 4.3  CL 104 103 103  CO2 24 26 28   GLUCOSE 115* 108* 118*  BUN 32* 25* 22  CREATININE 1.59* 1.52* 1.28*  CALCIUM 9.7 9.7 9.5  GFRNONAA 43* 45* 56*  GFRAA 50* 52* >60  PROT 7.5 7.6 7.2  ALBUMIN 4.2 4.1 4.0  AST 23 20 24   ALT 22 20 26   ALKPHOS 49 46 43  BILITOT 0.8 0.7 0.8    RADIOGRAPHIC STUDIES: I have personally reviewed the radiological images as listed and agreed with the findings in the report. 8/21/2020Bone scan showed uptake in the right knee surrounding prosthesis, degenerative type uptake at the shoulders, sternoclavicular joints, lumbar spine left knee and feet.  Also uptake in the mid sternum.  No definitive worrisome of sites of abnormal osseous tracer accumulation suggesting osseous metastatic disease. CT abdomen pelvis without contrast showed brachy therapy seeds in the prostate, no findings suspicious for metastatic disease. No results found.  ASSESSMENT & PLAN:   1. Prostate cancer ([place])   2. Encounter for antineoplastic chemotherapy   3. Androgen deprivation therapy   4. Stage 3a chronic kidney disease   5. Seizure [facility])   Prostate cancer, biochemical recurrence, castration resistance. Patient tolerates darolutamide 600 mg twice daily.  Continue current regimen. PSA has trended down.  Today's PSA is pending. Continue androgen deprivation therapy.  We will proceed with Lupron 22.5 mg today.  Chronic kidney disease, creatinine is stable.  Avoid nephro toxins. Patient used to have weak urinary stream symptoms.  Improved on Flomax.  Continue. History of seizure disorder.  Patient is on seizure medications.  Not actively following up with neurology.  We discussed and encourage patient to continue follow-up with neurology . No orders of the defined types were placed in this encounter. Patient has standing CBC, CMP, PSA ordered. We spent sufficient time to discuss many aspect of care,  questions were answered to patient's satisfaction. Return of visit: 12 weeks   Earlie Server, MD, PhD Hematology Oncology [facility] Pager- [phone] 02/21/2019

## 2019-02-24 ENCOUNTER — Other Ambulatory Visit: Payer: Self-pay

## 2019-02-24 ENCOUNTER — Other Ambulatory Visit
Admission: RE | Admit: 2019-02-24 | Discharge: 2019-02-24 | Disposition: A | Discharge status: Home / Self Care | Payer: Medicare HMO | Source: Ambulatory Visit | Attending: Internal Medicine | Admitting: Internal Medicine

## 2019-02-24 DIAGNOSIS — Z20828 Contact with and (suspected) exposure to other viral communicable diseases: Secondary | ICD-10-CM | POA: Diagnosis not present

## 2019-02-24 DIAGNOSIS — Z01812 Encounter for preprocedural laboratory examination: Secondary | ICD-10-CM | POA: Diagnosis present

## 2019-02-24 LAB — SARS CORONAVIRUS 2 (TAT 6-24 HRS): SARS Coronavirus 2: NEGATIVE

## 2019-02-28 ENCOUNTER — Other Ambulatory Visit: Payer: Self-pay

## 2019-02-28 ENCOUNTER — Encounter
Admission: RE | Disposition: A | Discharge status: Home / Self Care | Payer: Self-pay | Source: Home / Self Care | Attending: Internal Medicine

## 2019-02-28 ENCOUNTER — Ambulatory Visit: Payer: Medicare HMO | Admitting: Anesthesiology

## 2019-02-28 ENCOUNTER — Ambulatory Visit
Admission: RE | Admit: 2019-02-28 | Discharge: 2019-02-28 | Disposition: A | Discharge status: Home / Self Care | Payer: Medicare HMO | Attending: Internal Medicine | Admitting: Internal Medicine

## 2019-02-28 DIAGNOSIS — R195 Other fecal abnormalities: Secondary | ICD-10-CM | POA: Insufficient documentation

## 2019-02-28 DIAGNOSIS — E785 Hyperlipidemia, unspecified: Secondary | ICD-10-CM | POA: Insufficient documentation

## 2019-02-28 DIAGNOSIS — I69328 Other speech and language deficits following cerebral infarction: Secondary | ICD-10-CM | POA: Diagnosis not present

## 2019-02-28 DIAGNOSIS — Z86010 Personal history of colonic polyps: Secondary | ICD-10-CM | POA: Insufficient documentation

## 2019-02-28 DIAGNOSIS — I10 Essential (primary) hypertension: Secondary | ICD-10-CM | POA: Insufficient documentation

## 2019-02-28 DIAGNOSIS — Z7984 Long term (current) use of oral hypoglycemic drugs: Secondary | ICD-10-CM | POA: Diagnosis not present

## 2019-02-28 DIAGNOSIS — Z79899 Other long term (current) drug therapy: Secondary | ICD-10-CM | POA: Diagnosis not present

## 2019-02-28 DIAGNOSIS — Z8546 Personal history of malignant neoplasm of prostate: Secondary | ICD-10-CM | POA: Diagnosis not present

## 2019-02-28 DIAGNOSIS — M1711 Unilateral primary osteoarthritis, right knee: Secondary | ICD-10-CM | POA: Insufficient documentation

## 2019-02-28 DIAGNOSIS — D122 Benign neoplasm of ascending colon: Secondary | ICD-10-CM | POA: Insufficient documentation

## 2019-02-28 DIAGNOSIS — E119 Type 2 diabetes mellitus without complications: Secondary | ICD-10-CM | POA: Insufficient documentation

## 2019-02-28 HISTORY — PX: COLONOSCOPY WITH PROPOFOL: SHX5780

## 2019-02-28 LAB — GLUCOSE, CAPILLARY: Glucose-Capillary: 124 mg/dL — ABNORMAL HIGH (ref 70–99)

## 2019-02-28 SURGERY — COLONOSCOPY WITH PROPOFOL
Anesthesia: General

## 2019-02-28 MED ORDER — EPHEDRINE SULFATE 50 MG/ML IJ SOLN
INTRAMUSCULAR | Status: DC | PRN
  Administered 2019-02-28: 10 mg via INTRAVENOUS

## 2019-02-28 MED ORDER — PROPOFOL 10 MG/ML IV BOLUS
INTRAVENOUS | Status: DC | PRN
  Administered 2019-02-28: 70 mg via INTRAVENOUS

## 2019-02-28 MED ORDER — PROPOFOL 500 MG/50ML IV EMUL
INTRAVENOUS | Status: DC | PRN
  Administered 2019-02-28: 150 ug/kg/min via INTRAVENOUS

## 2019-02-28 MED ORDER — SODIUM CHLORIDE 0.9 % IV SOLN
INTRAVENOUS | Status: DC
  Administered 2019-02-28: 09:00:00 via INTRAVENOUS

## 2019-02-28 NOTE — Anesthesia Preprocedure Evaluation (Signed)
Anesthesia Evaluation  Patient identified by MRN, date of birth, ID band Patient awake    Reviewed: Allergy & Precautions, NPO status , Patient's Chart, lab work & pertinent test results  History of Anesthesia Complications Negative for: history of anesthetic complications  Airway Mallampati: III       Dental   Pulmonary neg sleep apnea, neg COPD, Not current smoker, former smoker,           Cardiovascular hypertension, Pt. on medications (-) Past MI and (-) CHF (-) dysrhythmias (-) Valvular Problems/Murmurs     Neuro/Psych Seizures - (with CVA),  CVA (speech difficulties), Residual Symptoms    GI/Hepatic Neg liver ROS, neg GERD  ,  Endo/Other  diabetes, Type 2, Oral Hypoglycemic Agents  Renal/GU negative Renal ROS     Musculoskeletal   Abdominal   Peds  Hematology   Anesthesia Other Findings   Reproductive/Obstetrics                             Anesthesia Physical Anesthesia Plan  ASA: III  Anesthesia Plan: General   Post-op Pain Management:    Induction: Intravenous  PONV Risk Score and Plan: 2  Airway Management Planned: Nasal Cannula  Additional Equipment:   Intra-op Plan:   Post-operative Plan:   Informed Consent: I have reviewed the patients History and Physical, chart, labs and discussed the procedure including the risks, benefits and alternatives for the proposed anesthesia with the patient or authorized representative who has indicated his/her understanding and acceptance.       Plan Discussed with:   Anesthesia Plan Comments:         Anesthesia Quick Evaluation

## 2019-02-28 NOTE — Op Note (Signed)
[facility] Gastroenterology Patient Name: Timothy Maddox Procedure Date: 02/28/2019 9:14 AM MRN: [id number] Account #: [id number] Date of Birth: 07-04-1945 Admit Type: Outpatient Age: 73 Room: [facility] ENDO ROOM 1 Gender: Male Note Status: Finalized Procedure:            Colonoscopy Indications:          Heme positive stool Providers:            Benay Pike. Alice Reichert MD, MD Referring MD:         Venetia Maxon. Elijio Miles, MD (Referring MD) Medicines:            Propofol per Anesthesia Complications:        No immediate complications. Procedure:            Pre-Anesthesia Assessment:                       - The risks and benefits of the procedure and the                        sedation options and risks were discussed with the                        patient. All questions were answered and informed                        consent was obtained.                       - Patient identification and proposed procedure were                        verified prior to the procedure by the nurse. The                        procedure was verified in the procedure room.                       - ASA Grade Assessment: III - A patient with severe                        systemic disease.                       - After reviewing the risks and benefits, the patient                        was deemed in satisfactory condition to undergo the                        procedure.                       After obtaining informed consent, the colonoscope was                        passed under direct vision. Throughout the procedure,                        the patient's blood pressure, pulse, and oxygen  saturations were monitored continuously. The                        Colonoscope was introduced through the anus and                        advanced to the the cecum, identified by appendiceal                        orifice and ileocecal valve. The colonoscopy was   performed without difficulty. The patient tolerated the                        procedure well. The quality of the bowel preparation                        was good. The ileocecal valve, appendiceal orifice, and                        rectum were photographed. Findings:      The perianal and digital rectal examinations were normal. Pertinent       negatives include normal sphincter tone and no palpable rectal lesions.      A 6 mm polyp was found in the proximal ascending colon. The polyp was       sessile. The polyp was removed with a cold snare. Resection and       retrieval were complete.      The exam was otherwise without abnormality on direct and retroflexion       views.      Non-bleeding internal hemorrhoids were found during retroflexion. The       hemorrhoids were Grade II (internal hemorrhoids that prolapse but reduce       spontaneously). Impression:           - One 6 mm polyp in the proximal ascending colon,                        removed with a cold snare. Resected and retrieved.                       - The examination was otherwise normal on direct and                        retroflexion views. Recommendation:       - Patient has a contact number available for                        emergencies. The signs and symptoms of potential                        delayed complications were discussed with the patient.                        Return to normal activities tomorrow. Written discharge                        instructions were provided to the patient.                       - Resume previous diet.                       -  Continue present medications.                       - Repeat colonoscopy is recommended for surveillance.                        The colonoscopy date will be determined after pathology                        results from today's exam become available for review.                       - Return to GI office PRN.                       - The findings and  recommendations were discussed with                        the patient. Procedure Code(s):    --- Professional ---                       [phone], Colonoscopy, flexible; with removal of tumor(s),                        polyp(s), or other lesion(s) by snare technique Diagnosis Code(s):    --- Professional ---                       R19.5, Other fecal abnormalities                       K63.5, Polyp of colon CPT copyright 2019 American Medical Association. All rights reserved. The codes documented in this report are preliminary and upon coder review may  be revised to meet current compliance requirements. Efrain Sella MD, MD 02/28/2019 9:36:41 AM This report has been signed electronically. Number of Addenda: 0 Note Initiated On: 02/28/2019 9:14 AM Scope Withdrawal Time: 0 hours 7 minutes 6 seconds  Total Procedure Duration: 0 hours 10 minutes 32 seconds  Estimated Blood Loss: Estimated blood loss: none. Estimated blood loss: none.      [facility]

## 2019-02-28 NOTE — H&P (Signed)
Outpatient short stay form Pre-procedure 02/28/2019 8:52 AM  Timothy Maddox, M.D.  Primary Physician: S. Ahmed Tejan-Sie  Reason for visit:  Heme positive stool  History of present illness:  73 y/o male presents for colonoscopy for hemoccult positive stool. Patient denies change in bowel habits, rectal bleeding, weight loss or abdominal pain.  Also has personal hx of adenomatous colon polyps.   No current facility-administered medications for this encounter.   Medications Prior to Admission  Medication Sig Dispense Refill Last Dose  . ACCU-CHEK GUIDE test strip USE 1 STRIP TO CHECK BLOOD SUGAR ONCE DAILY IN THE MORNING     . acetaminophen (TYLENOL) 500 MG tablet Take 500 mg by mouth every 6 (six) hours as needed.     Marland Kitchen amLODipine (NORVASC) 10 MG tablet Take 10 mg by mouth daily.     . diclofenac sodium (VOLTAREN) 1 % GEL diclofenac 1 % topical gel     . levETIRAcetam (ROWEEPRA) 500 MG tablet Take 500 mg by mouth 2 (two) times daily.     Marland Kitchen losartan-hydrochlorothiazide (HYZAAR) 100-25 MG tablet Take 1 tablet by mouth daily.  1   . metFORMIN (GLUCOPHAGE) 500 MG tablet Take 500 mg by mouth 2 (two) times daily with a meal.     . metoprolol tartrate (LOPRESSOR) 100 MG tablet Take 100 mg by mouth 2 (two) times daily.     . NUBEQA 300 MG TABS TAKE 2 TABLETS (600MG ) BY MOUTH TWO TIMES DAILY WITH A MEAL. 120 tablet 3   . rosuvastatin (CRESTOR) 5 MG tablet Take 5 mg by mouth at bedtime.      Marland Kitchen spironolactone (ALDACTONE) 100 MG tablet Take 100 mg by mouth 2 (two) times daily.     . tamsulosin (FLOMAX) 0.4 MG CAPS capsule Take 1 capsule (0.4 mg total) by mouth daily. 30 capsule 3      Allergies  Allergen Reactions  . Poison Oak Extract Rash  . Poison Ivy Extract Rash     Past Medical History:  Diagnosis Date  . Arthritis    lower back, shoulders  . AVM (arteriovenous malformation) brain   . Cancer [facility])    prostate- treated with radiation  . CVA (cerebral infarction) 11/2015  .  Diabetes mellitus without complication ([place])   . Dyspnea    with exertion  . HOH (hard of hearing)   . Hyperlipidemia   . Hypertension   . Pneumonia    1st grade  . Pre-diabetes   . Prediabetes   . Seizures ([place]) 12/2015  . Stroke [facility])    speech impairment, no weakness    Review of systems:  Otherwise negative.    Physical Exam  Gen: Alert, oriented. Appears stated age.  HEENT: [place]/AT. PERRLA. Lungs: CTA, no wheezes. CV: RR nl S1, S2. Abd: soft, benign, no masses. BS+ Ext: No edema. Pulses 2+    Planned procedures: Proceed with colonoscopy. The patient understands the nature of the planned procedure, indications, risks, alternatives and potential complications including but not limited to bleeding, infection, perforation, damage to internal organs and possible oversedation/side effects from anesthesia. The patient agrees and gives consent to proceed.  Please refer to procedure notes for findings, recommendations and patient disposition/instructions.      Timothy Maddox, M.D. Gastroenterology 02/28/2019  8:52 AM

## 2019-02-28 NOTE — Anesthesia Procedure Notes (Signed)
Date/Time: 02/28/2019 9:23 AM Performed by: Nelda Marseille, CRNA Pre-anesthesia Checklist: Patient identified, Emergency Drugs available, Suction available, Patient being monitored and Timeout performed Oxygen Delivery Method: Nasal cannula

## 2019-02-28 NOTE — Transfer of Care (Signed)
Immediate Anesthesia Transfer of Care Note  Patient: Timothy Maddox  Procedure(s) Performed: COLONOSCOPY WITH PROPOFOL (N/A )  Patient Location: PACU  Anesthesia Type:General  Level of Consciousness: sedated  Airway & Oxygen Therapy: Patient Spontanous Breathing and Patient connected to nasal cannula oxygen  Post-op Assessment: Report given to RN and Post -op Vital signs reviewed and stable  Post vital signs: Reviewed and stable  Last Vitals:  Vitals Value Taken Time  BP 93/60 02/28/19 0940  Temp 36.1 C 02/28/19 0937  Pulse 55 02/28/19 0940  Resp 18 02/28/19 0940  SpO2 94 % 02/28/19 0940  Vitals shown include unvalidated device data.  Last Pain:  Vitals:   02/28/19 0937  TempSrc: Tympanic  PainSc: Asleep         Complications: No apparent anesthesia complications

## 2019-02-28 NOTE — Interval H&P Note (Signed)
History and Physical Interval Note:  02/28/2019 8:54 AM  Timothy Maddox  has presented today for surgery, with the diagnosis of HEME + HX POLYPS.  The various methods of treatment have been discussed with the patient and family. After consideration of risks, benefits and other options for treatment, the patient has consented to  Procedure(s): COLONOSCOPY WITH PROPOFOL (N/A) as a surgical intervention.  The patient's history has been reviewed, patient examined, no change in status, stable for surgery.  I have reviewed the patient's chart and labs.  Questions were answered to the patient's satisfaction.     [place], [place]

## 2019-02-28 NOTE — Anesthesia Postprocedure Evaluation (Signed)
Anesthesia Post Note  Patient: Timothy Maddox  Procedure(s) Performed: COLONOSCOPY WITH PROPOFOL (N/A )  Patient location during evaluation: Endoscopy Anesthesia Type: General Level of consciousness: awake and alert Pain management: pain level controlled Vital Signs Assessment: post-procedure vital signs reviewed and stable Respiratory status: spontaneous breathing and respiratory function stable Cardiovascular status: stable Anesthetic complications: no     Last Vitals:  Vitals:   02/28/19 0937 02/28/19 0947  BP: 93/60 (!) 99/57  Pulse:    Resp: 17   Temp: (!) 36.1 C   SpO2: 94%     Last Pain:  Vitals:   02/28/19 1007  TempSrc:   PainSc: 0-No pain                 Roschelle Calandra K

## 2019-02-28 NOTE — Anesthesia Post-op Follow-up Note (Signed)
Anesthesia QCDR form completed.        

## 2019-03-01 LAB — SURGICAL PATHOLOGY

## 2019-03-07 MED FILL — NUBEQA 300 MG TABS: 300 | 30 days supply | Qty: 120 | Fill #3

## 2019-04-01 ENCOUNTER — Other Ambulatory Visit: Payer: Self-pay | Admitting: Oncology

## 2019-04-01 DIAGNOSIS — C61 Malignant neoplasm of prostate: Secondary | ICD-10-CM

## 2019-04-06 MED FILL — NUBEQA 300 MG TABS: 300 | 30 days supply | Qty: 120 | Fill #0

## 2019-05-03 MED FILL — NUBEQA 300 MG TABS: 300 | 30 days supply | Qty: 120 | Fill #1

## 2019-05-17 ENCOUNTER — Inpatient Hospital Stay: Payer: Medicare HMO | Attending: Oncology

## 2019-05-17 ENCOUNTER — Inpatient Hospital Stay (HOSPITAL_BASED_OUTPATIENT_CLINIC_OR_DEPARTMENT_OTHER): Payer: Medicare HMO | Admitting: Oncology

## 2019-05-17 ENCOUNTER — Other Ambulatory Visit: Payer: Self-pay

## 2019-05-17 ENCOUNTER — Inpatient Hospital Stay: Payer: Medicare HMO

## 2019-05-17 ENCOUNTER — Encounter: Payer: Self-pay | Admitting: Oncology

## 2019-05-17 VITALS — BP 114/74 | HR 54 | Temp 97.6°F | Resp 18 | Wt 280.8 lb

## 2019-05-17 DIAGNOSIS — Z87891 Personal history of nicotine dependence: Secondary | ICD-10-CM | POA: Insufficient documentation

## 2019-05-17 DIAGNOSIS — Z7984 Long term (current) use of oral hypoglycemic drugs: Secondary | ICD-10-CM | POA: Insufficient documentation

## 2019-05-17 DIAGNOSIS — E119 Type 2 diabetes mellitus without complications: Secondary | ICD-10-CM | POA: Insufficient documentation

## 2019-05-17 DIAGNOSIS — E785 Hyperlipidemia, unspecified: Secondary | ICD-10-CM | POA: Diagnosis not present

## 2019-05-17 DIAGNOSIS — Z8249 Family history of ischemic heart disease and other diseases of the circulatory system: Secondary | ICD-10-CM | POA: Insufficient documentation

## 2019-05-17 DIAGNOSIS — N1831 Chronic kidney disease, stage 3a: Secondary | ICD-10-CM | POA: Diagnosis not present

## 2019-05-17 DIAGNOSIS — Z8673 Personal history of transient ischemic attack (TIA), and cerebral infarction without residual deficits: Secondary | ICD-10-CM | POA: Diagnosis not present

## 2019-05-17 DIAGNOSIS — Z791 Long term (current) use of non-steroidal anti-inflammatories (NSAID): Secondary | ICD-10-CM

## 2019-05-17 DIAGNOSIS — G8929 Other chronic pain: Secondary | ICD-10-CM | POA: Insufficient documentation

## 2019-05-17 DIAGNOSIS — Z833 Family history of diabetes mellitus: Secondary | ICD-10-CM | POA: Diagnosis not present

## 2019-05-17 DIAGNOSIS — C61 Malignant neoplasm of prostate: Secondary | ICD-10-CM

## 2019-05-17 DIAGNOSIS — Z79818 Long term (current) use of other agents affecting estrogen receptors and estrogen levels: Secondary | ICD-10-CM

## 2019-05-17 DIAGNOSIS — I129 Hypertensive chronic kidney disease with stage 1 through stage 4 chronic kidney disease, or unspecified chronic kidney disease: Secondary | ICD-10-CM

## 2019-05-17 DIAGNOSIS — R569 Unspecified convulsions: Secondary | ICD-10-CM

## 2019-05-17 DIAGNOSIS — M549 Dorsalgia, unspecified: Secondary | ICD-10-CM

## 2019-05-17 DIAGNOSIS — Z5111 Encounter for antineoplastic chemotherapy: Secondary | ICD-10-CM | POA: Insufficient documentation

## 2019-05-17 DIAGNOSIS — R972 Elevated prostate specific antigen [PSA]: Secondary | ICD-10-CM

## 2019-05-17 DIAGNOSIS — Z8546 Personal history of malignant neoplasm of prostate: Secondary | ICD-10-CM | POA: Diagnosis not present

## 2019-05-17 DIAGNOSIS — Z79899 Other long term (current) drug therapy: Secondary | ICD-10-CM

## 2019-05-17 LAB — COMPREHENSIVE METABOLIC PANEL
ALT: 25 U/L (ref 0–44)
AST: 25 U/L (ref 15–41)
Albumin: 4.1 g/dL (ref 3.5–5.0)
Alkaline Phosphatase: 44 U/L (ref 38–126)
Anion gap: 11 (ref 5–15)
BUN: 25 mg/dL — ABNORMAL HIGH (ref 8–23)
CO2: 24 mmol/L (ref 22–32)
Calcium: 9.4 mg/dL (ref 8.9–10.3)
Chloride: 103 mmol/L (ref 98–111)
Creatinine, Ser: 1.23 mg/dL (ref 0.61–1.24)
GFR calc Af Amer: >60 mL/min (ref >60)
GFR calc non Af Amer: 58 mL/min — ABNORMAL LOW (ref >60)
Glucose, Bld: 118 mg/dL — ABNORMAL HIGH (ref 70–99)
Potassium: 4.1 mmol/L (ref 3.5–5.1)
Sodium: 138 mmol/L (ref 135–145)
Total Bilirubin: 0.6 mg/dL (ref 0.3–1.2)
Total Protein: 7.5 g/dL (ref 6.5–8.1)

## 2019-05-17 LAB — CBC WITH DIFFERENTIAL/PLATELET
Abs Immature Granulocytes: 0.01 10*3/uL (ref 0.00–0.07)
Basophils Absolute: 0 10*3/uL (ref 0.0–0.1)
Basophils Relative: 1 %
Eosinophils Absolute: 0.3 10*3/uL (ref 0.0–0.5)
Eosinophils Relative: 5 %
HCT: 40.6 % (ref 39.0–52.0)
Hemoglobin: 13.6 g/dL (ref 13.0–17.0)
Immature Granulocytes: 0 %
Lymphocytes Relative: 26 %
Lymphs Abs: 1.3 10*3/uL (ref 0.7–4.0)
MCH: 30.6 pg (ref 26.0–34.0)
MCHC: 33.5 g/dL (ref 30.0–36.0)
MCV: 91.4 fL (ref 80.0–100.0)
Monocytes Absolute: 0.7 10*3/uL (ref 0.1–1.0)
Monocytes Relative: 14 %
Neutro Abs: 2.5 10*3/uL (ref 1.7–7.7)
Neutrophils Relative %: 54 %
Platelets: 177 10*3/uL (ref 150–400)
RBC: 4.44 MIL/uL (ref 4.22–5.81)
RDW: 12.6 % (ref 11.5–15.5)
WBC: 4.8 10*3/uL (ref 4.0–10.5)
nRBC: 0 % (ref 0.0–0.2)

## 2019-05-17 LAB — PSA: Prostatic Specific Antigen: 0.93 ng/mL (ref 0.00–4.00)

## 2019-05-17 MED ORDER — LEUPROLIDE ACETATE (3 MONTH) 22.5 MG ~~LOC~~ KIT
22.5000 mg | PACK | Freq: Once | SUBCUTANEOUS | Status: AC
  Administered 2019-05-17: 22.5 mg via SUBCUTANEOUS
  Filled 2019-05-17: qty 22.5

## 2019-05-17 NOTE — Progress Notes (Signed)
Hematology/Oncology follow up note Timothy Maddox Telephone:([phone] Fax:([phone]   Patient Care Team: [name], MD as PCP - General (Internal Medicine) [name], Ronda Fairly, MD (Urology)  REFERRING PROVIDER: Dr.Stoioff  REASON FOR VISIT:  Management of prostate cancer  HISTORY OF PRESENTING ILLNESS:  Timothy Maddox is a  74 y.o.  male with PMH listed below who was referred to me for evaluation of prostate cancer.  Prostate cancer diagnosis dated back to 2013.  I do  Not have his previous oncology medical records. Per Dr.Stoioff, he completed IMRT in February 2014 followed by androgen deprivation therapy. Patient previously followed Dr. Marian Sorrow at [facility] and then [facility] in [place].  Recently he was seen by Dr. John Giovanni at [facility] urology.  Per urology notes, patient was diagnosed with a Gleason score 5+4 with 100% involvement in 6 out of 6 cores on the left and a 3 out of 6 cores on the right.  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  Bone scan was normal at that time.   Patient was treated with Lupron for about 18 months. PSA starts to rise.  August 2018 PSA was 9.3.  Patient had a CT and bone scan done was negative for metastatic disease.  Testosterone level was 53 in August 2018.  Patient was restarted on Lupron injection every 3 months since then.  December 2018 PSA decreased to 4.9 after ADT restarted. March 2019 PSA 5.4 Testosterone was checked in June 2019 within castration level.  Patient reports chronic back pain.  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  History of seizure, takes Roweepra 500 twice daily, Topiramate 5mg  BID. Marland Kitchen  Daughter accompanies patient to today's visit.  INTERVAL HISTORY Timothy Maddox is a 74 y.o. 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  Patient reports feeling well today.  No new complaints. Chronic back pain is unchanged. Patient is taking darolutamide 600 mg twice daily since April 2020.  He tolerates well. Chronic seizure disorder, no recent seizure activities.  He is not actively following up with neurology.  Review of Systems  Constitutional: Negative for chills, fever, malaise/fatigue and weight loss.  HENT: Negative for sore throat.   Eyes: Negative for redness.  Respiratory: Negative for cough, shortness of breath and wheezing.   Cardiovascular: Negative for chest pain, palpitations and leg swelling.  Gastrointestinal: Negative for abdominal pain, blood in stool, nausea and vomiting.  Genitourinary: Negative for dysuria.  Musculoskeletal: Positive for back pain. Negative for myalgias.  Skin: Negative for rash.  Neurological: Negative for dizziness, tingling and tremors.  Endo/Heme/Allergies: Does not bruise/bleed easily.  Psychiatric/Behavioral: Negative for hallucinations.    MEDICAL HISTORY:  Past Medical History:  Diagnosis Date  . Arthritis    lower back, shoulders  . AVM (arteriovenous malformation) brain   . Cancer [facility])    prostate- treated with radiation  . CVA (cerebral infarction) 11/2015  . Diabetes mellitus without complication ([place])   . Dyspnea    with exertion  . HOH (hard of hearing)   . Hyperlipidemia   . Hypertension   . Pneumonia    1st grade  . Pre-diabetes   . Prediabetes   . Seizures ([place]) 12/2015  . Stroke [facility])    speech impairment, no weakness    SURGICAL HISTORY: Past Surgical History:  Procedure Laterality Date  . BACK SURGERY  1999   disectomy  . BACK SURGERY     2 surgery- injury  . COLONOSCOPY W/ POLYPECTOMY    . COLONOSCOPY WITH PROPOFOL N/A 02/28/2019   Procedure: COLONOSCOPY WITH PROPOFOL;  Surgeon: Toledo, Benay Pike, MD;  Location: [facility] ENDOSCOPY;  Service: Gastroenterology;  Laterality: N/A;  . HAMMER  TOE SURGERY Bilateral   . INGUINAL HERNIA REPAIR Right    per patient when he was in 1st grade  . IR GENERIC HISTORICAL  12/05/2015   IR RADIOLOGIST EVAL & MGMT 12/05/2015 [facility]  . IR GENERIC HISTORICAL  01/31/2016   IR RADIOLOGIST EVAL & MGMT 01/31/2016 [facility]  . IR GENERIC HISTORICAL  02/20/2016   IR ANGIO INTRA EXTRACRAN SEL COM CAROTID INNOMINATE UNI L MOD SED 02/20/2016 [name], MD [facility]  . IR GENERIC HISTORICAL  02/20/2016   IR 3D INDEPENDENT WKST 02/20/2016 [name], MD [facility]  . KNEE SURGERY Bilateral 2015  . RADIOLOGY WITH ANESTHESIA N/A 02/20/2016   Procedure: EMBOLIZATION;  Surgeon: [name], MD;  Location: [facility];  Service: Radiology;  Laterality: N/A;  . TOE SURGERY Left 2017   TOE NAIL REMOVAL  . TOTAL KNEE ARTHROPLASTY Right 07/20/2017   Procedure: TOTAL KNEE ARTHROPLASTY;  Surgeon: Lovell Sheehan, MD;  Location: [facility] ORS;  Service: Orthopedics;  Laterality: Right;    SOCIAL HISTORY: Social History   Socioeconomic History  . Marital status: Married    Spouse name: Not on file  . Number of children: Not on file  . Years of education: Not on file  . Highest education level: Not on file  Occupational History  . Occupation: retired Curator  Tobacco Use  . Smoking status: Former Smoker    Packs/day: 1.00    Years: 2.00    Pack years: 2.00    Types: Cigarettes    Quit date: 05/05/1966    Years since quitting: 53.0  . Smokeless tobacco: Never Used  . Tobacco comment: quit age 21  Substance and Sexual Activity  . Alcohol use: Not Currently  . Drug use: No  . Sexual activity: Not Currently  Other Topics Concern  . Not on file  Social History Narrative  . Not on file   Social Determinants of Health   Financial Resource Strain:   . Difficulty of Paying Living Expenses: Not on file  Food Insecurity:   . Worried About Charity fundraiser in the Last Year: Not on file  . Ran Out of Food in the Last Year: Not on  file  Transportation Needs:   . Lack of Transportation (Medical): Not on file  . Lack of Transportation (Non-Medical): Not on file  Physical Activity:   . Days of Exercise per Week: Not on file  . Minutes of Exercise per Session: Not on file  Stress:   . Feeling of Stress : Not on file  Social Connections:   . Frequency of Communication with Friends and Family: Not on file  . Frequency of Social Gatherings with Friends and Family: Not on file  . Attends Religious Services: Not on file  . Active Member of Clubs or Organizations: Not on file  . Attends Archivist Meetings: Not on file  . Marital Status: Not on file  Intimate Partner Violence:   . Fear of Current or Ex-Partner: Not on file  . Emotionally Abused: Not on file  . Physically Abused: Not on file  . Sexually Abused: Not on file    FAMILY HISTORY: Family  History  Problem Relation Age of Onset  . Heart failure Father   . Diabetes Father   . Hypertension Father   . Alzheimer's disease Father 72  . Other Mother 12       homicide  . Alcohol abuse Mother   . Pancreatic cancer Sister   . Alcohol abuse Sister   . Diabetes Brother   . Diabetes Paternal Aunt   . Diabetes Paternal Grandmother   . Diabetes Paternal Grandfather     ALLERGIES:  is allergic to poison oak extract and poison ivy extract.  MEDICATIONS:  Current Outpatient Medications  Medication Sig Dispense Refill  . ACCU-CHEK GUIDE test strip USE 1 STRIP TO CHECK BLOOD SUGAR ONCE DAILY IN THE MORNING    . amLODipine (NORVASC) 10 MG tablet Take 10 mg by mouth daily.    Marland Kitchen levETIRAcetam (ROWEEPRA) 500 MG tablet Take 500 mg by mouth 2 (two) times daily.    Marland Kitchen losartan-hydrochlorothiazide (HYZAAR) 100-25 MG tablet Take 1 tablet by mouth daily.  1  . metFORMIN (GLUCOPHAGE) 500 MG tablet Take 500 mg by mouth 2 (two) times daily with a meal.    . metoprolol tartrate (LOPRESSOR) 100 MG tablet Take 100 mg by mouth 2 (two) times daily.    . NUBEQA 300 MG  tablet TAKE 2 TABLETS (600MG ) BY MOUTH TWO TIMES DAILY WITH A MEAL. 120 tablet 3  . rosuvastatin (CRESTOR) 5 MG tablet Take 5 mg by mouth at bedtime.     Marland Kitchen spironolactone (ALDACTONE) 100 MG tablet Take 100 mg by mouth 2 (two) times daily.    . tamsulosin (FLOMAX) 0.4 MG CAPS capsule Take 1 capsule (0.4 mg total) by mouth daily. 30 capsule 3  . acetaminophen (TYLENOL) 500 MG tablet Take 500 mg by mouth every 6 (six) hours as needed.    . diclofenac sodium (VOLTAREN) 1 % GEL diclofenac 1 % topical gel     No current facility-administered medications for this visit.     PHYSICAL EXAMINATION: ECOG PERFORMANCE STATUS: 1 - Symptomatic but completely ambulatory Vitals:   05/17/19 1323  BP: 114/74  Pulse: (!) 54  Resp: 18  Temp: 97.6 F (36.4 C)  SpO2: 97%   Filed Weights   05/17/19 1323  Weight: 280 lb 12.8 oz (127.4 kg)    Physical Exam Constitutional:      General: He is not in acute distress.    Appearance: He is obese.  HENT:     Head: Normocephalic and atraumatic.  Eyes:     General: No scleral icterus.    Pupils: Pupils are equal, round, and reactive to light.  Cardiovascular:     Rate and Rhythm: Normal rate and regular rhythm.     Heart sounds: Normal heart sounds.  Pulmonary:     Effort: Pulmonary effort is normal. No respiratory distress.     Breath sounds: No wheezing.  Abdominal:     General: Bowel sounds are normal. There is no distension.     Palpations: Abdomen is soft. There is no mass.     Tenderness: There is no abdominal tenderness.  Musculoskeletal:        General: No deformity. Normal range of motion.     Cervical back: Normal range of motion and neck supple.  Skin:    General: Skin is warm and dry.     Findings: No erythema or rash.  Neurological:     Mental Status: He is alert and oriented to person, place, and time.  Cranial Nerves: No cranial nerve deficit.     Coordination: Coordination normal.  Psychiatric:        Behavior: Behavior  normal.        Thought Content: Thought content normal.      LABORATORY DATA:  I have reviewed the data as listed Lab Results  Component Value Date   WBC 4.8 05/17/2019   HGB 13.6 05/17/2019   HCT 40.6 05/17/2019   MCV 91.4 05/17/2019   PLT 177 05/17/2019   Recent Labs    12/27/18 1331 02/21/19 0937 05/17/19 1249  NA 139 138 138  K 4.6 4.3 4.1  CL 103 103 103  CO2 26 28 24   GLUCOSE 108* 118* 118*  BUN 25* 22 25*  CREATININE 1.52* 1.28* 1.23  CALCIUM 9.7 9.5 9.4  GFRNONAA 45* 56* 58*  GFRAA 52* >60 >60  PROT 7.6 7.2 7.5  ALBUMIN 4.1 4.0 4.1  AST 20 24 25   ALT 20 26 25   ALKPHOS 46 43 44  BILITOT 0.7 0.8 0.6    RADIOGRAPHIC STUDIES: I have personally reviewed the radiological images as listed and agreed with the findings in the report. 8/21/2020Bone scan showed uptake in the right knee surrounding prosthesis, degenerative type uptake at the shoulders, sternoclavicular joints, lumbar spine left knee and feet.  Also uptake in the mid sternum.  No definitive worrisome of sites of abnormal osseous tracer accumulation suggesting osseous metastatic disease. CT abdomen pelvis without contrast showed brachy therapy seeds in the prostate, no findings suspicious for metastatic disease. No results found.  ASSESSMENT & PLAN:   1. Prostate cancer ([place])   2. Encounter for antineoplastic chemotherapy   3. Androgen deprivation therapy   4. Stage 3a chronic kidney disease   Prostate cancer, biochemical recurrence, castration resistance. Patient tolerates darolutamide 600 mg twice daily.   Labs are reviewed and discussed with patient. Counts are stable. PSA continues to trend down 0.93.  Continue current regimen.  Androgen deprivation therapy, patient will proceed with Lupron 22.5 mg today. Chronic kidney disease, creatinine is stable.  Avoid nephrotoxins. Continue Flomax for possible BPH  Patient has standing CBC, CMP, PSA ordered. We spent sufficient time to discuss many  aspect of care, questions were answered to patient's satisfaction. Return of visit: 12 weeks   Earlie Server, MD, PhD Hematology Oncology [facility] at [facility] Pager- [phone] 05/17/2019

## 2019-05-17 NOTE — Progress Notes (Signed)
Patient here for follow up. No concerns voiced.  °

## 2019-06-02 MED FILL — NUBEQA 300 MG TABS: 300 | 30 days supply | Qty: 120 | Fill #2

## 2019-07-05 MED FILL — NUBEQA 300 MG TABS: 300 | 30 days supply | Qty: 120 | Fill #3

## 2019-08-03 ENCOUNTER — Other Ambulatory Visit: Payer: Self-pay | Admitting: Oncology

## 2019-08-03 DIAGNOSIS — C61 Malignant neoplasm of prostate: Secondary | ICD-10-CM

## 2019-08-05 ENCOUNTER — Other Ambulatory Visit: Payer: Self-pay | Admitting: *Deleted

## 2019-08-05 DIAGNOSIS — C61 Malignant neoplasm of prostate: Secondary | ICD-10-CM

## 2019-08-05 MED ORDER — NUBEQA 300 MG PO TABS: ORAL_TABLET | ORAL | 3 refills | Status: DC

## 2019-08-05 MED FILL — NUBEQA 300 MG TABS: 300 | 30 days supply | Qty: 120 | Fill #0

## 2019-08-09 ENCOUNTER — Other Ambulatory Visit: Payer: Self-pay

## 2019-08-09 ENCOUNTER — Encounter: Payer: Self-pay | Admitting: Oncology

## 2019-08-09 ENCOUNTER — Inpatient Hospital Stay: Payer: Medicare HMO

## 2019-08-09 ENCOUNTER — Inpatient Hospital Stay (HOSPITAL_BASED_OUTPATIENT_CLINIC_OR_DEPARTMENT_OTHER): Payer: Medicare HMO | Admitting: Oncology

## 2019-08-09 ENCOUNTER — Inpatient Hospital Stay: Payer: Medicare HMO | Attending: Oncology

## 2019-08-09 VITALS — BP 119/70 | HR 55 | Temp 99.1°F | Resp 18 | Wt 284.3 lb

## 2019-08-09 DIAGNOSIS — IMO0001 Reserved for inherently not codable concepts without codable children: Secondary | ICD-10-CM

## 2019-08-09 DIAGNOSIS — Z79818 Long term (current) use of other agents affecting estrogen receptors and estrogen levels: Secondary | ICD-10-CM

## 2019-08-09 DIAGNOSIS — Z5111 Encounter for antineoplastic chemotherapy: Secondary | ICD-10-CM | POA: Diagnosis not present

## 2019-08-09 DIAGNOSIS — C61 Malignant neoplasm of prostate: Secondary | ICD-10-CM

## 2019-08-09 DIAGNOSIS — N1831 Chronic kidney disease, stage 3a: Secondary | ICD-10-CM | POA: Diagnosis not present

## 2019-08-09 DIAGNOSIS — R972 Elevated prostate specific antigen [PSA]: Secondary | ICD-10-CM

## 2019-08-09 DIAGNOSIS — Z79899 Other long term (current) drug therapy: Secondary | ICD-10-CM | POA: Diagnosis not present

## 2019-08-09 LAB — CBC WITH DIFFERENTIAL/PLATELET
Abs Immature Granulocytes: 0.01 10*3/uL (ref 0.00–0.07)
Basophils Absolute: 0 10*3/uL (ref 0.0–0.1)
Basophils Relative: 1 %
Eosinophils Absolute: 0.3 10*3/uL (ref 0.0–0.5)
Eosinophils Relative: 5 %
HCT: 38.4 % — ABNORMAL LOW (ref 39.0–52.0)
Hemoglobin: 13.4 g/dL (ref 13.0–17.0)
Immature Granulocytes: 0 %
Lymphocytes Relative: 25 %
Lymphs Abs: 1.2 10*3/uL (ref 0.7–4.0)
MCH: 31.2 pg (ref 26.0–34.0)
MCHC: 34.9 g/dL (ref 30.0–36.0)
MCV: 89.5 fL (ref 80.0–100.0)
Monocytes Absolute: 0.7 10*3/uL (ref 0.1–1.0)
Monocytes Relative: 15 %
Neutro Abs: 2.5 10*3/uL (ref 1.7–7.7)
Neutrophils Relative %: 54 %
Platelets: 176 10*3/uL (ref 150–400)
RBC: 4.29 MIL/uL (ref 4.22–5.81)
RDW: 12.7 % (ref 11.5–15.5)
WBC: 4.6 10*3/uL (ref 4.0–10.5)
nRBC: 0 % (ref 0.0–0.2)

## 2019-08-09 LAB — COMPREHENSIVE METABOLIC PANEL
ALT: 26 U/L (ref 0–44)
AST: 29 U/L (ref 15–41)
Albumin: 3.9 g/dL (ref 3.5–5.0)
Alkaline Phosphatase: 41 U/L (ref 38–126)
Anion gap: 10 (ref 5–15)
BUN: 19 mg/dL (ref 8–23)
CO2: 25 mmol/L (ref 22–32)
Calcium: 9.3 mg/dL (ref 8.9–10.3)
Chloride: 102 mmol/L (ref 98–111)
Creatinine, Ser: 1.24 mg/dL (ref 0.61–1.24)
GFR calc Af Amer: >60 mL/min (ref >60)
GFR calc non Af Amer: 57 mL/min — ABNORMAL LOW (ref >60)
Glucose, Bld: 114 mg/dL — ABNORMAL HIGH (ref 70–99)
Potassium: 4 mmol/L (ref 3.5–5.1)
Sodium: 137 mmol/L (ref 135–145)
Total Bilirubin: 0.9 mg/dL (ref 0.3–1.2)
Total Protein: 7.2 g/dL (ref 6.5–8.1)

## 2019-08-09 LAB — PSA: Prostatic Specific Antigen: 1.25 ng/mL (ref 0.00–4.00)

## 2019-08-09 MED ORDER — LEUPROLIDE ACETATE (3 MONTH) 22.5 MG ~~LOC~~ KIT
22.5000 mg | PACK | Freq: Once | SUBCUTANEOUS | Status: AC
  Administered 2019-08-09: 14:00:00 22.5 mg via SUBCUTANEOUS
  Filled 2019-08-09: qty 22.5

## 2019-08-09 NOTE — Progress Notes (Signed)
Hematology/Oncology follow up note [facility] Telephone:(336) [phone] Fax:(336) [phone]   Patient Care Team: [name], MD as PCP - General (Internal Medicine) [name], Ronda Fairly, MD (Urology)  REFERRING PROVIDER: Dr.Stoioff  REASON FOR VISIT:  Management of prostate cancer  HISTORY OF PRESENTING ILLNESS:  Timothy Maddox is a  74 y.o.  male with PMH listed below who was referred to me for evaluation of prostate cancer.  Prostate cancer diagnosis dated back to 2013.  I do  Not have his previous oncology medical records. Per Dr.Stoioff, he completed IMRT in February 2014 followed by androgen deprivation therapy. Patient previously followed Dr. Marian Sorrow at [facility] urology and then [facility] in [place].  Recently he was seen by Dr. John Giovanni at [facility] urology.  Per urology notes, patient was diagnosed with a Gleason score 5+4 with 100% involvement in 6 out of 6 cores on the left and a 3 out of 6 cores on the right.  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  Bone scan was normal at that time.   Patient was treated with Lupron for about 18 months. PSA starts to rise.  August 2018 PSA was 9.3.  Patient had a CT and bone scan done was negative for metastatic disease.  Testosterone level was 53 in August 2018.  Patient was restarted on Lupron injection every 3 months since then.  December 2018 PSA decreased to 4.9 after ADT restarted. March 2019 PSA 5.4 Testosterone was checked in June 2019 within castration level.  Patient reports chronic back pain.  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  History of seizure, takes Roweepra 500 twice daily, Topiramate 5mg  BID. Marland Kitchen  darolutamide 600 mg twice daily since April 2020.  He tolerates well.  INTERVAL HISTORY Timothy Maddox is a 74 y.o. 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  Patient has no new complaints today.  Denies any fever, chills, unintentional weight loss. Chronic back pain is unchanged.   Review of Systems  Constitutional: Negative for chills, fever, malaise/fatigue and weight loss.  HENT: Negative for sore throat.   Eyes: Negative for redness.  Respiratory: Negative for cough, shortness of breath and wheezing.   Cardiovascular: Negative for chest pain, palpitations and leg swelling.  Gastrointestinal: Negative for abdominal pain, blood in stool, nausea and vomiting.  Genitourinary: Negative for dysuria.  Musculoskeletal: Positive for back pain. Negative for myalgias.  Skin: Negative for rash.  Neurological: Negative for dizziness, tingling and tremors.  Endo/Heme/Allergies: Does not bruise/bleed easily.  Psychiatric/Behavioral: Negative for hallucinations.    MEDICAL HISTORY:  Past Medical History:  Diagnosis Date  . Arthritis    lower back, shoulders  . AVM (arteriovenous malformation) brain   . Cancer [facility])    prostate- treated with radiation  . CVA (cerebral infarction) 11/2015  . Diabetes mellitus without complication ([place])   . Dyspnea    with exertion  . HOH (hard of hearing)   . Hyperlipidemia   . Hypertension   . Pneumonia    1st grade  . Pre-diabetes   . Prediabetes   . Seizures ([place]) 12/2015  . Stroke [facility])    speech impairment, no weakness    SURGICAL HISTORY: Past Surgical History:  Procedure Laterality Date  . BACK SURGERY  1999   disectomy  . BACK SURGERY     2 surgery- injury  . COLONOSCOPY  W/ POLYPECTOMY    . COLONOSCOPY WITH PROPOFOL N/A 02/28/2019   Procedure: COLONOSCOPY WITH PROPOFOL;  Surgeon: Toledo, Benay Pike, MD;  Location: [facility] ENDOSCOPY;  Service: Gastroenterology;  Laterality: N/A;  . HAMMER TOE SURGERY Bilateral   . INGUINAL HERNIA REPAIR Right    per patient when he was in 1st grade  . IR GENERIC HISTORICAL   12/05/2015   IR RADIOLOGIST EVAL & MGMT 12/05/2015 [facility]  . IR GENERIC HISTORICAL  01/31/2016   IR RADIOLOGIST EVAL & MGMT 01/31/2016 [facility]  . IR GENERIC HISTORICAL  02/20/2016   IR ANGIO INTRA EXTRACRAN SEL COM CAROTID INNOMINATE UNI L MOD SED 02/20/2016 [name], MD [facility]  . IR GENERIC HISTORICAL  02/20/2016   IR 3D INDEPENDENT WKST 02/20/2016 [name], MD [facility]  . KNEE SURGERY Bilateral 2015  . RADIOLOGY WITH ANESTHESIA N/A 02/20/2016   Procedure: EMBOLIZATION;  Surgeon: [name], MD;  Location: [place];  Service: Radiology;  Laterality: N/A;  . TOE SURGERY Left 2017   TOE NAIL REMOVAL  . TOTAL KNEE ARTHROPLASTY Right 07/20/2017   Procedure: TOTAL KNEE ARTHROPLASTY;  Surgeon: Lovell Sheehan, MD;  Location: [facility] ORS;  Service: Orthopedics;  Laterality: Right;    SOCIAL HISTORY: Social History   Socioeconomic History  . Marital status: Married    Spouse name: Not on file  . Number of children: Not on file  . Years of education: Not on file  . Highest education level: Not on file  Occupational History  . Occupation: retired Curator  Tobacco Use  . Smoking status: Former Smoker    Packs/day: 1.00    Years: 2.00    Pack years: 2.00    Types: Cigarettes    Quit date: 05/05/1966    Years since quitting: 53.2  . Smokeless tobacco: Never Used  . Tobacco comment: quit age 19  Substance and Sexual Activity  . Alcohol use: Not Currently  . Drug use: No  . Sexual activity: Not Currently  Other Topics Concern  . Not on file  Social History Narrative  . Not on file   Social Determinants of Health   Financial Resource Strain:   . Difficulty of Paying Living Expenses:   Food Insecurity:   . Worried About Charity fundraiser in the Last Year:   . Arboriculturist in the Last Year:   Transportation Needs:   . Film/video editor (Medical):   Marland Kitchen Lack of Transportation (Non-Medical):   Physical Activity:   . Days of Exercise  per Week:   . Minutes of Exercise per Session:   Stress:   . Feeling of Stress :   Social Connections:   . Frequency of Communication with Friends and Family:   . Frequency of Social Gatherings with Friends and Family:   . Attends Religious Services:   . Active Member of Clubs or Organizations:   . Attends Archivist Meetings:   Marland Kitchen Marital Status:   Intimate Partner Violence:   . Fear of Current or Ex-Partner:   . Emotionally Abused:   Marland Kitchen Physically Abused:   . Sexually Abused:     FAMILY HISTORY: Family History  Problem Relation Age of Onset  . Heart failure Father   . Diabetes Father   . Hypertension Father   . Alzheimer's disease Father 18  . Other Mother 36       homicide  . Alcohol abuse Mother   . Pancreatic cancer Sister   .  Alcohol abuse Sister   . Diabetes Brother   . Diabetes Paternal Aunt   . Diabetes Paternal Grandmother   . Diabetes Paternal Grandfather     ALLERGIES:  is allergic to poison oak extract and poison ivy extract.  MEDICATIONS:  Current Outpatient Medications  Medication Sig Dispense Refill  . ACCU-CHEK GUIDE test strip USE 1 STRIP TO CHECK BLOOD SUGAR ONCE DAILY IN THE MORNING    . acetaminophen (TYLENOL) 500 MG tablet Take 500 mg by mouth every 6 (six) hours as needed.    Marland Kitchen amLODipine (NORVASC) 10 MG tablet Take 10 mg by mouth daily.    . darolutamide (NUBEQA) 300 MG tablet TAKE 2 TABLETS (600MG ) BY MOUTH TWO TIMES DAILY WITH A MEAL. 120 tablet 3  . levETIRAcetam (ROWEEPRA) 500 MG tablet Take 500 mg by mouth 2 (two) times daily.    Marland Kitchen losartan-hydrochlorothiazide (HYZAAR) 100-25 MG tablet Take 1 tablet by mouth daily.  1  . metFORMIN (GLUCOPHAGE) 500 MG tablet Take 500 mg by mouth 2 (two) times daily with a meal.    . metoprolol tartrate (LOPRESSOR) 100 MG tablet Take 100 mg by mouth 2 (two) times daily.    . rosuvastatin (CRESTOR) 5 MG tablet Take 5 mg by mouth at bedtime.     Marland Kitchen spironolactone (ALDACTONE) 100 MG tablet Take 100 mg  by mouth 2 (two) times daily.    . tamsulosin (FLOMAX) 0.4 MG CAPS capsule Take 1 capsule (0.4 mg total) by mouth daily. 30 capsule 3   No current facility-administered medications for this visit.     PHYSICAL EXAMINATION: ECOG PERFORMANCE STATUS: 1 - Symptomatic but completely ambulatory Vitals:   08/09/19 1304  BP: 119/70  Pulse: (!) 55  Resp: 18  Temp: 99.1 F (37.3 C)   Filed Weights   08/09/19 1304  Weight: 284 lb 4.8 oz (129 kg)    Physical Exam Constitutional:      General: He is not in acute distress.    Appearance: He is obese.  HENT:     Head: Normocephalic and atraumatic.  Eyes:     General: No scleral icterus.    Pupils: Pupils are equal, round, and reactive to light.  Cardiovascular:     Rate and Rhythm: Normal rate and regular rhythm.     Heart sounds: Normal heart sounds.  Pulmonary:     Effort: Pulmonary effort is normal. No respiratory distress.     Breath sounds: No wheezing.  Abdominal:     General: Bowel sounds are normal. There is no distension.     Palpations: Abdomen is soft. There is no mass.     Tenderness: There is no abdominal tenderness.  Musculoskeletal:        General: No deformity. Normal range of motion.     Cervical back: Normal range of motion and neck supple.  Skin:    General: Skin is warm and dry.     Findings: No erythema or rash.  Neurological:     Mental Status: He is alert and oriented to person, place, and time. Mental status is at baseline.     Cranial Nerves: No cranial nerve deficit.     Coordination: Coordination normal.  Psychiatric:        Mood and Affect: Mood normal.        Behavior: Behavior normal.        Thought Content: Thought content normal.      LABORATORY DATA:  I have reviewed the data as listed Lab  Results  Component Value Date   WBC 4.6 08/09/2019   HGB 13.4 08/09/2019   HCT 38.4 (L) 08/09/2019   MCV 89.5 08/09/2019   PLT 176 08/09/2019   Recent Labs    02/21/19 0937 05/17/19 1249  08/09/19 1245  NA 138 138 137  K 4.3 4.1 4.0  CL 103 103 102  CO2 28 24 25   GLUCOSE 118* 118* 114*  BUN 22 25* 19  CREATININE 1.28* 1.23 1.24  CALCIUM 9.5 9.4 9.3  GFRNONAA 56* 58* 57*  GFRAA >60 >60 >60  PROT 7.2 7.5 7.2  ALBUMIN 4.0 4.1 3.9  AST 24 25 29   ALT 26 25 26   ALKPHOS 43 44 41  BILITOT 0.8 0.6 0.9    RADIOGRAPHIC STUDIES: I have personally reviewed the radiological images as listed and agreed with the findings in the report. 8/21/2020Bone scan showed uptake in the right knee surrounding prosthesis, degenerative type uptake at the shoulders, sternoclavicular joints, lumbar spine left knee and feet.  Also uptake in the mid sternum.  No definitive worrisome of sites of abnormal osseous tracer accumulation suggesting osseous metastatic disease. CT abdomen pelvis without contrast showed brachy therapy seeds in the prostate, no findings suspicious for metastatic disease. No results found.  ASSESSMENT & PLAN:   1. Prostate cancer ([place])   2. Androgen deprivation therapy   3. Encounter for antineoplastic chemotherapy   4. Stage 3a chronic kidney disease   Prostate cancer, biochemical recurrence, castration resistance. Patient tolerates darolutamide 600 mg twice daily.   Labs reviewed and discussed with patient. Counts are stable.  PSA is pending. Continue current regimen.  Patient is due for androgen deprivation therapy.  He has tolerated well.  Proceed with Lupron 22.5 mg today.    Chronic kidney disease, creatinine is stable.  Avoid nephrotoxins.   Continue Flomax   Return of visit: 12 weeks   Earlie Server, MD, PhD Hematology Oncology [facility] at [facility] Pager- [phone] 08/09/2019

## 2019-08-09 NOTE — Progress Notes (Signed)
Patient here for follow up. No concerns voiced.  °

## 2019-09-06 MED FILL — NUBEQA 300 MG TABS: 300 | 30 days supply | Qty: 120 | Fill #1

## 2019-09-29 MED FILL — NUBEQA 300 MG TABS: 300 | 30 days supply | Qty: 120 | Fill #2

## 2019-10-31 MED FILL — NUBEQA 300 MG TABS: 300 | 30 days supply | Qty: 120 | Fill #3

## 2019-11-01 ENCOUNTER — Inpatient Hospital Stay (HOSPITAL_BASED_OUTPATIENT_CLINIC_OR_DEPARTMENT_OTHER): Payer: Medicare HMO | Admitting: Oncology

## 2019-11-01 ENCOUNTER — Encounter: Payer: Self-pay | Admitting: Oncology

## 2019-11-01 ENCOUNTER — Inpatient Hospital Stay: Payer: Medicare HMO | Attending: Oncology

## 2019-11-01 ENCOUNTER — Other Ambulatory Visit: Payer: Self-pay

## 2019-11-01 ENCOUNTER — Inpatient Hospital Stay: Payer: Medicare HMO

## 2019-11-01 VITALS — BP 113/76 | HR 57 | Temp 96.0°F | Resp 18 | Wt 287.1 lb

## 2019-11-01 DIAGNOSIS — Z5111 Encounter for antineoplastic chemotherapy: Secondary | ICD-10-CM

## 2019-11-01 DIAGNOSIS — N1831 Chronic kidney disease, stage 3a: Secondary | ICD-10-CM

## 2019-11-01 DIAGNOSIS — Z79818 Long term (current) use of other agents affecting estrogen receptors and estrogen levels: Secondary | ICD-10-CM | POA: Diagnosis not present

## 2019-11-01 DIAGNOSIS — Z79899 Other long term (current) drug therapy: Secondary | ICD-10-CM | POA: Diagnosis not present

## 2019-11-01 DIAGNOSIS — IMO0001 Reserved for inherently not codable concepts without codable children: Secondary | ICD-10-CM

## 2019-11-01 DIAGNOSIS — C61 Malignant neoplasm of prostate: Secondary | ICD-10-CM

## 2019-11-01 DIAGNOSIS — R972 Elevated prostate specific antigen [PSA]: Secondary | ICD-10-CM

## 2019-11-01 LAB — CBC WITH DIFFERENTIAL/PLATELET
Abs Immature Granulocytes: 0.01 10*3/uL (ref 0.00–0.07)
Basophils Absolute: 0.1 10*3/uL (ref 0.0–0.1)
Basophils Relative: 1 %
Eosinophils Absolute: 0.3 10*3/uL (ref 0.0–0.5)
Eosinophils Relative: 6 %
HCT: 38.8 % — ABNORMAL LOW (ref 39.0–52.0)
Hemoglobin: 13.8 g/dL (ref 13.0–17.0)
Immature Granulocytes: 0 %
Lymphocytes Relative: 26 %
Lymphs Abs: 1.4 10*3/uL (ref 0.7–4.0)
MCH: 31.8 pg (ref 26.0–34.0)
MCHC: 35.6 g/dL (ref 30.0–36.0)
MCV: 89.4 fL (ref 80.0–100.0)
Monocytes Absolute: 0.7 10*3/uL (ref 0.1–1.0)
Monocytes Relative: 14 %
Neutro Abs: 2.8 10*3/uL (ref 1.7–7.7)
Neutrophils Relative %: 53 %
Platelets: 199 10*3/uL (ref 150–400)
RBC: 4.34 MIL/uL (ref 4.22–5.81)
RDW: 12.6 % (ref 11.5–15.5)
WBC: 5.2 10*3/uL (ref 4.0–10.5)
nRBC: 0 % (ref 0.0–0.2)

## 2019-11-01 LAB — COMPREHENSIVE METABOLIC PANEL
ALT: 23 U/L (ref 0–44)
AST: 26 U/L (ref 15–41)
Albumin: 4.1 g/dL (ref 3.5–5.0)
Alkaline Phosphatase: 45 U/L (ref 38–126)
Anion gap: 9 (ref 5–15)
BUN: 25 mg/dL — ABNORMAL HIGH (ref 8–23)
CO2: 24 mmol/L (ref 22–32)
Calcium: 9.2 mg/dL (ref 8.9–10.3)
Chloride: 103 mmol/L (ref 98–111)
Creatinine, Ser: 1.45 mg/dL — ABNORMAL HIGH (ref 0.61–1.24)
GFR calc Af Amer: 55 mL/min — ABNORMAL LOW (ref >60)
GFR calc non Af Amer: 47 mL/min — ABNORMAL LOW (ref >60)
Glucose, Bld: 133 mg/dL — ABNORMAL HIGH (ref 70–99)
Potassium: 4.1 mmol/L (ref 3.5–5.1)
Sodium: 136 mmol/L (ref 135–145)
Total Bilirubin: 0.7 mg/dL (ref 0.3–1.2)
Total Protein: 7.5 g/dL (ref 6.5–8.1)

## 2019-11-01 LAB — PSA: Prostatic Specific Antigen: 2.14 ng/mL (ref 0.00–4.00)

## 2019-11-01 MED ORDER — LEUPROLIDE ACETATE (3 MONTH) 22.5 MG ~~LOC~~ KIT
22.5000 mg | PACK | Freq: Once | SUBCUTANEOUS | Status: AC
  Administered 2019-11-01: 22.5 mg via SUBCUTANEOUS
  Filled 2019-11-01: qty 22.5

## 2019-11-01 NOTE — Progress Notes (Signed)
Patient here for follow up. No new concerns voiced.  °

## 2019-11-01 NOTE — Progress Notes (Signed)
Hematology/Oncology follow up note [facility] [phone] [phone]   Patient Care Team: [name], MD as PCP - General (Internal Medicine) [name], Ronda Fairly, MD (Urology)  REFERRING PROVIDER: Dr.Stoioff  REASON FOR VISIT:  Management of prostate cancer  HISTORY OF PRESENTING ILLNESS:  Timothy Maddox is a  74 y.o.  male with PMH listed below who was referred to me for evaluation of prostate cancer.  Prostate cancer diagnosis dated back to 2013.  I do  Not have his previous oncology medical records. Per Dr.Stoioff, he completed IMRT in February 2014 followed by androgen deprivation therapy. Patient previously followed Dr. Marian Sorrow at [facility] and then [facility] in [place].  Recently he was seen by Dr. John Giovanni at [facility].  Per urology notes, patient was diagnosed with a Gleason score 5+4 with 100% involvement in 6 out of 6 cores on the left and a 3 out of 6 cores on the right.  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  Bone scan was normal at that time.   Patient was treated with Lupron for about 18 months. PSA starts to rise.  August 2018 PSA was 9.3.  Patient had a CT and bone scan done was negative for metastatic disease.  Testosterone level was 53 in August 2018.  Patient was restarted on Lupron injection every 3 months since then.  December 2018 PSA decreased to 4.9 after ADT restarted. March 2019 PSA 5.4 Testosterone was checked in June 2019 within castration level.  Patient reports chronic back pain.  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  History of seizure, takes Roweepra 500 twice daily, Topiramate 5mg  BID. Marland Kitchen  darolutamide 600 mg twice daily since April 2020.  He tolerates well.  INTERVAL HISTORY Timothy Maddox is a 74 y.o. 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  Patient has no new complaints today.  Denies any fever, chills, unintentional weight loss. Patient takes Flomax for BPH symptoms.  He has some intermittent urinary frequency  which is chronic for him.  Chronic back pain has not changed   Review of Systems  Constitutional: Negative for chills, fever, malaise/fatigue and weight loss.  HENT: Negative for sore throat.   Eyes: Negative for redness.  Respiratory: Negative for cough, shortness of breath and wheezing.   Cardiovascular: Negative for chest pain, palpitations and leg swelling.  Gastrointestinal: Negative for abdominal pain, blood in stool, nausea and vomiting.  Genitourinary: Positive for frequency. Negative for dysuria.  Musculoskeletal: Positive for back pain. Negative for myalgias.  Skin: Negative for rash.  Neurological: Negative for dizziness, tingling and tremors.  Endo/Heme/Allergies: Does not bruise/bleed easily.  Psychiatric/Behavioral: Negative for hallucinations.    MEDICAL HISTORY:  Past Medical History:  Diagnosis Date  . Arthritis    lower back, shoulders  . AVM (arteriovenous malformation) brain   . Cancer [facility])    prostate- treated with radiation  . CVA (cerebral infarction) 11/2015  . Diabetes mellitus without complication ([place])   . Dyspnea    with exertion  . HOH (hard of hearing)   . Hyperlipidemia   . Hypertension   . Pneumonia    1st grade  . Pre-diabetes   . Prediabetes   . Seizures ([place]) 12/2015  . Stroke [facility])    speech impairment, no weakness    SURGICAL HISTORY: Past Surgical History:  Procedure Laterality  Date  . BACK SURGERY  1999   disectomy  . BACK SURGERY     2 surgery- injury  . COLONOSCOPY W/ POLYPECTOMY    . COLONOSCOPY WITH PROPOFOL N/A 02/28/2019   Procedure: COLONOSCOPY WITH PROPOFOL;  Surgeon: Toledo, Benay Pike, MD;  Location: [facility] ENDOSCOPY;  Service: Gastroenterology;  Laterality: N/A;  .  HAMMER TOE SURGERY Bilateral   . INGUINAL HERNIA REPAIR Right    per patient when he was in 1st grade  . IR GENERIC HISTORICAL  12/05/2015   IR RADIOLOGIST EVAL & MGMT 12/05/2015 [facility]  . IR GENERIC HISTORICAL  01/31/2016   IR RADIOLOGIST EVAL & MGMT 01/31/2016 [facility]  . IR GENERIC HISTORICAL  02/20/2016   IR ANGIO INTRA EXTRACRAN SEL COM CAROTID INNOMINATE UNI L MOD SED 02/20/2016 [name], MD [facility]  . IR GENERIC HISTORICAL  02/20/2016   IR 3D INDEPENDENT WKST 02/20/2016 [name], MD [facility]  . KNEE SURGERY Bilateral 2015  . RADIOLOGY WITH ANESTHESIA N/A 02/20/2016   Procedure: EMBOLIZATION;  Surgeon: [name], MD;  Location: [place];  Service: Radiology;  Laterality: N/A;  . TOE SURGERY Left 2017   TOE NAIL REMOVAL  . TOTAL KNEE ARTHROPLASTY Right 07/20/2017   Procedure: TOTAL KNEE ARTHROPLASTY;  Surgeon: Lovell Sheehan, MD;  Location: [facility] ORS;  Service: Orthopedics;  Laterality: Right;    SOCIAL HISTORY: Social History   Socioeconomic History  . Marital status: Married    Spouse name: Not on file  . Number of children: Not on file  . Years of education: Not on file  . Highest education level: Not on file  Occupational History  . Occupation: retired Curator  Tobacco Use  . Smoking status: Former Smoker    Packs/day: 1.00    Years: 2.00    Pack years: 2.00    Types: Cigarettes    Quit date: 05/05/1966    Years since quitting: 53.5  . Smokeless tobacco: Never Used  . Tobacco comment: quit age 43  Vaping Use  . Vaping Use: Never used  Substance and Sexual Activity  . Alcohol use: Not Currently  . Drug use: No  . Sexual activity: Not Currently  Other Topics Concern  . Not on file  Social History Narrative  . Not on file   Social Determinants of Health   Financial Resource Strain:   . Difficulty of Paying Living Expenses:   Food Insecurity:   . Worried About Charity fundraiser in the Last Year:   . Arboriculturist  in the Last Year:   Transportation Needs:   . Film/video editor (Medical):   Marland Kitchen Lack of Transportation (Non-Medical):   Physical Activity:   . Days of Exercise per Week:   . Minutes of Exercise per Session:   Stress:   . Feeling of Stress :   Social Connections:   . Frequency of Communication with Friends and Family:   . Frequency of Social Gatherings with Friends and Family:   . Attends Religious Services:   . Active Member of Clubs or Organizations:   . Attends Archivist Meetings:   Marland Kitchen Marital Status:   Intimate Partner Violence:   . Fear of Current or Ex-Partner:   . Emotionally Abused:   Marland Kitchen Physically Abused:   . Sexually Abused:     FAMILY HISTORY: Family History  Problem Relation Age of Onset  . Heart failure Father   . Diabetes Father   . Hypertension  Father   . Alzheimer's disease Father 81  . Other Mother 46       homicide  . Alcohol abuse Mother   . Pancreatic cancer Sister   . Alcohol abuse Sister   . Diabetes Brother   . Diabetes Paternal Aunt   . Diabetes Paternal Grandmother   . Diabetes Paternal Grandfather     ALLERGIES:  is allergic to poison oak extract and poison ivy extract.  MEDICATIONS:  Current Outpatient Medications  Medication Sig Dispense Refill  . ACCU-CHEK GUIDE test strip USE 1 STRIP TO CHECK BLOOD SUGAR ONCE DAILY IN THE MORNING    . acetaminophen (TYLENOL) 500 MG tablet Take 500 mg by mouth every 6 (six) hours as needed.    Marland Kitchen amLODipine (NORVASC) 10 MG tablet Take 10 mg by mouth daily.    . darolutamide (NUBEQA) 300 MG tablet TAKE 2 TABLETS (600MG ) BY MOUTH TWO TIMES DAILY WITH A MEAL. 120 tablet 3  . levETIRAcetam (ROWEEPRA) 500 MG tablet Take 500 mg by mouth 2 (two) times daily.    Marland Kitchen losartan-hydrochlorothiazide (HYZAAR) 100-25 MG tablet Take 1 tablet by mouth daily.  1  . metFORMIN (GLUCOPHAGE) 500 MG tablet Take 500 mg by mouth 2 (two) times daily with a meal.    . metoprolol tartrate (LOPRESSOR) 100 MG tablet Take  100 mg by mouth 2 (two) times daily.    . rosuvastatin (CRESTOR) 5 MG tablet Take 5 mg by mouth at bedtime.     Marland Kitchen spironolactone (ALDACTONE) 100 MG tablet Take 100 mg by mouth 2 (two) times daily.    . tamsulosin (FLOMAX) 0.4 MG CAPS capsule Take 1 capsule (0.4 mg total) by mouth daily. 30 capsule 3   No current facility-administered medications for this visit.     PHYSICAL EXAMINATION: ECOG PERFORMANCE STATUS: 1 - Symptomatic but completely ambulatory Vitals:   11/01/19 0938  BP: 113/76  Pulse: (!) 57  Resp: 18  Temp: (!) 96 F (35.6 C)   Filed Weights   11/01/19 0938  Weight: 287 lb 1.6 oz (130.2 kg)    Physical Exam Constitutional:      General: He is not in acute distress.    Appearance: He is obese.  HENT:     Head: Normocephalic and atraumatic.  Eyes:     General: No scleral icterus.    Pupils: Pupils are equal, round, and reactive to light.  Cardiovascular:     Rate and Rhythm: Normal rate and regular rhythm.     Heart sounds: Normal heart sounds.  Pulmonary:     Effort: Pulmonary effort is normal. No respiratory distress.     Breath sounds: No wheezing.  Abdominal:     General: Bowel sounds are normal. There is no distension.     Palpations: Abdomen is soft. There is no mass.     Tenderness: There is no abdominal tenderness.  Musculoskeletal:        General: No deformity. Normal range of motion.     Cervical back: Normal range of motion and neck supple.  Skin:    General: Skin is warm and dry.     Findings: No erythema or rash.  Neurological:     Mental Status: He is alert and oriented to person, place, and time. Mental status is at baseline.     Cranial Nerves: No cranial nerve deficit.     Coordination: Coordination normal.  Psychiatric:        Mood and Affect: Mood normal.  Behavior: Behavior normal.        Thought Content: Thought content normal.      LABORATORY DATA:  I have reviewed the data as listed Lab Results  Component Value  Date   WBC 5.2 11/01/2019   HGB 13.8 11/01/2019   HCT 38.8 (L) 11/01/2019   MCV 89.4 11/01/2019   PLT 199 11/01/2019   Recent Labs    05/17/19 1249 08/09/19 1245 11/01/19 0925  NA 138 137 136  K 4.1 4.0 4.1  CL 103 102 103  CO2 24 25 24   GLUCOSE 118* 114* 133*  BUN 25* 19 25*  CREATININE 1.23 1.24 1.45*  CALCIUM 9.4 9.3 9.2  GFRNONAA 58* 57* 47*  GFRAA >60 >60 55*  PROT 7.5 7.2 7.5  ALBUMIN 4.1 3.9 4.1  AST 25 29 26   ALT 25 26 23   ALKPHOS 44 41 45  BILITOT 0.6 0.9 0.7    RADIOGRAPHIC STUDIES: I have personally reviewed the radiological images as listed and agreed with the findings in the report. 8/21/2020Bone scan showed uptake in the right knee surrounding prosthesis, degenerative type uptake at the shoulders, sternoclavicular joints, lumbar spine left knee and feet.  Also uptake in the mid sternum.  No definitive worrisome of sites of abnormal osseous tracer accumulation suggesting osseous metastatic disease. CT abdomen pelvis without contrast showed brachy therapy seeds in the prostate, no findings suspicious for metastatic disease. No results found.  ASSESSMENT & PLAN:   1. Prostate cancer ([place])   2. Androgen deprivation therapy   3. Encounter for antineoplastic chemotherapy   4. Stage 3a chronic kidney disease   Prostate cancer, biochemical recurrence, castration resistance. Patient tolerates darolutamide 600 mg twice daily Labs reviewed and discussed with patient. PSA has been monitored.  Today's PSA was pending when the patient was in the clinic PSA has trended up to 2.14. Recommend patient to continue current regimen for now. He may have been slowly developing resistant to darolutamide.  Trend PSA at the next visit.  Patient is due for androgen deprivation therapy.  He has tolerated well.  Proceed with Lupron 22.5 mg today.    Chronic kidney disease, his creatinine fluctuates.  Today's creatinine is 1.45.  Recommend patient to avoid nephrotoxins.  Encourage  good oral hydration. Continue Flomax for possible BPH symptoms.   Return of visit: 3 months   Earlie Server, MD, PhD Hematology Oncology [facility] at [facility] Pager- [phone] 11/01/2019

## 2019-11-04 ENCOUNTER — Other Ambulatory Visit: Payer: Self-pay

## 2019-11-04 ENCOUNTER — Ambulatory Visit: Payer: Medicare HMO | Admitting: Podiatry

## 2019-11-04 DIAGNOSIS — L603 Nail dystrophy: Secondary | ICD-10-CM

## 2019-11-04 DIAGNOSIS — E0843 Diabetes mellitus due to underlying condition with diabetic autonomic (poly)neuropathy: Secondary | ICD-10-CM

## 2019-11-04 DIAGNOSIS — L601 Onycholysis: Secondary | ICD-10-CM | POA: Diagnosis not present

## 2019-11-04 MED ORDER — GENTAMICIN SULFATE 0.1 % EX CREA: 1.0000 "application " | TOPICAL_CREAM | Freq: Two times a day (BID) | CUTANEOUS | 1 refills | Status: DC

## 2019-11-04 NOTE — Progress Notes (Signed)
   HPI: 74 y.o. male presenting today PMHx diabetes mellitus with neuropathy as a new patient for evaluation of a dystrophic nail to the left great toe.  Patient states that approximately 4 years ago he had the nail removed because the nail with thickening and detaching from the underlying skin.  Slowly over time the nail has grown out and it is currently detached again and feels loose.  He denies pain but he is diabetic and is concerned about the possibility of losing the toenail or getting it caught with socks.  He presents for further treatment evaluation  Past Medical History:  Diagnosis Date  . Arthritis    lower back, shoulders  . AVM (arteriovenous malformation) brain   . Cancer [facility])    prostate- treated with radiation  . CVA (cerebral infarction) 11/2015  . Diabetes mellitus without complication ([place])   . Dyspnea    with exertion  . HOH (hard of hearing)   . Hyperlipidemia   . Hypertension   . Pneumonia    1st grade  . Pre-diabetes   . Prediabetes   . Seizures ([place]) 12/2015  . Stroke [facility])    speech impairment, no weakness     Physical Exam: General: The patient is alert and oriented x3 in no acute distress.  Dermatology: Skin is warm, dry and supple bilateral lower extremities. Negative for open lesions or macerations.  Hyperkeratotic dystrophic partially detached nail noted to the left hallux nail plate.  Vascular: Palpable pedal pulses bilaterally. No edema or erythema noted. Capillary refill within normal limits.  Neurological: Epicritic and protective threshold diminished bilaterally.   Musculoskeletal Exam: Range of motion within normal limits to all pedal and ankle joints bilateral. Muscle strength 5/5 in all groups bilateral.    Assessment: 1.  Dystrophic nail left hallux nail plate with onycholysis 2.  Diabetes mellitus with peripheral polyneuropathy  Plan of Care:  1. Patient evaluated.  2.  Today we discussed different treatment options associated to  the left hallux nail plate.  I do believe since this is recurrent that he would benefit from total permanent nail avulsion to the left hallux nail plate.  I discussed all possible complications and details the procedure.  No guarantees were expressed or implied.  The patient agreed and would like to proceed with total permanent nail avulsion of the left hallux nail plate. 3.  Prior to procedure a digital block was performed using 3 mL of 2% lidocaine plain in a digital block fashion left hallux 4.  Toe was prepped in aseptic manner and total permanent nail avulsion was performed to the left hallux nail plate with complete removal of the nail plate followed by 3 x [id number] application of phenol and alcohol flush. 5.  Light dressing was applied and post procedure care instructions were provided 6.  Prescription for gentamicin cream 7.  Return to clinic as needed      Edrick Kins, DPM [facility]  Dr. Edrick Kins, DPM    [address], [place] [address]                Office [phone]  Fax [phone]

## 2019-11-22 ENCOUNTER — Other Ambulatory Visit: Payer: Self-pay | Admitting: Oncology

## 2019-11-22 DIAGNOSIS — C61 Malignant neoplasm of prostate: Secondary | ICD-10-CM

## 2019-11-30 ENCOUNTER — Other Ambulatory Visit: Payer: Self-pay | Admitting: Oncology

## 2019-11-30 MED FILL — NUBEQA 300 MG TABS: 300 | 30 days supply | Qty: 120 | Fill #0

## 2019-12-02 ENCOUNTER — Inpatient Hospital Stay: Payer: Medicare HMO | Admitting: Oncology

## 2019-12-05 ENCOUNTER — Telehealth: Payer: Self-pay | Admitting: Pharmacy Technician

## 2019-12-05 NOTE — Telephone Encounter (Signed)
Oral Oncology Patient Advocate Encounter  Patient has been approved for copay assistance with [employer] (TAF).  [employer] [place] will cover all copayment expenses for Lupron for the remainder of the calendar year.    The billing information is as follows:  Member ID: [id number] Group ID: [id number] PCN: [id number] BIN: [id number] Eligibility Dates: 05/06/19 to 05/04/20  Fund: [employer] [facility] [place] Phone [phone] Fax [phone] 12/05/2019 11:09 AM

## 2019-12-23 ENCOUNTER — Other Ambulatory Visit: Payer: Self-pay

## 2019-12-23 ENCOUNTER — Ambulatory Visit: Payer: Medicare HMO | Admitting: Urology

## 2019-12-23 ENCOUNTER — Encounter: Payer: Self-pay | Admitting: Urology

## 2019-12-23 VITALS — BP 106/66 | HR 67 | Ht 72.0 in | Wt 282.0 lb

## 2019-12-23 DIAGNOSIS — R310 Gross hematuria: Secondary | ICD-10-CM | POA: Diagnosis not present

## 2019-12-23 DIAGNOSIS — C61 Malignant neoplasm of prostate: Secondary | ICD-10-CM | POA: Diagnosis not present

## 2019-12-23 NOTE — Progress Notes (Signed)
12/23/2019 6:12 PM   Timothy Maddox June 15, 1945 [id number]  Referring provider: [name], MD [place],  [place] [address]  Chief Complaint  Patient presents with  . Prostate Cancer    Urologic history: 1.  Prostate cancer -Clinical T3N0M0 high risk prostate cancer -Initially diagnosed 2013; PSA 10; Gleason 5+4; 6/6 cores positive L (some with 100% involvement); 3/6 cores right -MRI extracapsular extension on R -Bone scan negative -Lupron started 02/2012; IMRT completed 2014 w/ 18 months ADT -Was initially diagnosed at [facility] and lost to follow-up for several years, seen at [facility] in [place] until 2018 where PSA noted to be elevated (but value not listed) just prior to move to [facility] -PSA levels between 2013-2015 range from 0.15-0.5 -PSA rise 2018; repeat staging with negative CT and bone scan -Started leuprolide September 2018 with PSA at 9.3 -PSA nadir was 4.9 -Referred to medical oncology August 2019 with PSA 6.49 -Repeat staging CT/bone scan August 2019 - for metastatic disease -Due to history seizure disorder he was reluctant to take apalutamide or enzalutamide -Started darolutamide April 2020 (PSA 23.75) -PSA nadir after darolutamide started 0.93 January 2021 -PSA June 2021 2.14 -PSA trend:       HPI: 74 y.o. male presents for follow-up of prostate cancer.   He had CT abdomen/pelvis performed at [facility] 12/08/2019 which showed multiple bibasilar pulmonary nodules measuring up to 3.5 cm in addition to multiple sclerotic lesions of the spine and pelvis  Was recommended by his PCP to follow-up with urology  As noted intermittent gross hematuria  Complains of urinary frequency and urgency  Remains on tamsulosin   PMH: Past Medical History:  Diagnosis Date  . Arthritis    lower back, shoulders  . AVM (arteriovenous malformation) brain   . Cancer [facility])    prostate- treated with radiation  . CVA (cerebral  infarction) 11/2015  . Diabetes mellitus without complication ([place])   . Dyspnea    with exertion  . HOH (hard of hearing)   . Hyperlipidemia   . Hypertension   . Pneumonia    1st grade  . Pre-diabetes   . Prediabetes   . Seizures ([place]) 12/2015  . Stroke [facility])    speech impairment, no weakness    Surgical History: Past Surgical History:  Procedure Laterality Date  . BACK SURGERY  1999   disectomy  . BACK SURGERY     2 surgery- injury  . COLONOSCOPY W/ POLYPECTOMY    . COLONOSCOPY WITH PROPOFOL N/A 02/28/2019   Procedure: COLONOSCOPY WITH PROPOFOL;  Surgeon: Toledo, Benay Pike, MD;  Location: [facility] ENDOSCOPY;  Service: Gastroenterology;  Laterality: N/A;  . HAMMER TOE SURGERY Bilateral   . INGUINAL HERNIA REPAIR Right    per patient when he was in 1st grade  . IR GENERIC HISTORICAL  12/05/2015   IR RADIOLOGIST EVAL & MGMT 12/05/2015 [facility]  . IR GENERIC HISTORICAL  01/31/2016   IR RADIOLOGIST EVAL & MGMT 01/31/2016 [facility]  . IR GENERIC HISTORICAL  02/20/2016   IR ANGIO INTRA EXTRACRAN SEL COM CAROTID INNOMINATE UNI L MOD SED 02/20/2016 [name], MD [facility]  . IR GENERIC HISTORICAL  02/20/2016   IR 3D INDEPENDENT WKST 02/20/2016 [name], MD [facility]  . KNEE SURGERY Bilateral 2015  . RADIOLOGY WITH ANESTHESIA N/A 02/20/2016   Procedure: EMBOLIZATION;  Surgeon: [name], MD;  Location: [place];  Service: Radiology;  Laterality: N/A;  . TOE SURGERY Left 2017   TOE  NAIL REMOVAL  . TOTAL KNEE ARTHROPLASTY Right 07/20/2017   Procedure: TOTAL KNEE ARTHROPLASTY;  Surgeon: Lovell Sheehan, MD;  Location: [facility];  Service: Orthopedics;  Laterality: Right;    Home Medications:  Allergies as of 12/23/2019      Reactions   Poison Oak Extract Rash   Poison Ivy Extract Rash      Medication List       Accurate as of December 23, 2019  6:12 PM. If you have any questions, ask your nurse or doctor.        Accu-Chek Guide test  strip Generic drug: glucose blood USE 1 STRIP TO CHECK BLOOD SUGAR ONCE DAILY IN THE MORNING   acetaminophen 500 MG tablet Commonly known as: TYLENOL Take 500 mg by mouth every 6 (six) hours as needed.   amLODipine 10 MG tablet Commonly known as: NORVASC Take 10 mg by mouth daily.   gentamicin cream 0.1 % Commonly known as: GARAMYCIN Apply 1 application topically 2 (two) times daily.   losartan-hydrochlorothiazide 100-25 MG tablet Commonly known as: HYZAAR Take 1 tablet by mouth daily.   metFORMIN 500 MG tablet Commonly known as: GLUCOPHAGE Take 500 mg by mouth 2 (two) times daily with a meal.   metoprolol tartrate 100 MG tablet Commonly known as: LOPRESSOR Take 100 mg by mouth 2 (two) times daily.   Nubeqa 300 MG tablet Generic drug: darolutamide TAKE 2 TABLETS (600MG ) BY MOUTH TWO TIMES DAILY WITH A MEAL.   rosuvastatin 5 MG tablet Commonly known as: CRESTOR Take 5 mg by mouth at bedtime.   Roweepra 500 MG tablet Generic drug: levETIRAcetam Take 500 mg by mouth 2 (two) times daily.   spironolactone 100 MG tablet Commonly known as: ALDACTONE Take 100 mg by mouth 2 (two) times daily.   tamsulosin 0.4 MG Caps capsule Commonly known as: FLOMAX TAKE ONE CAPSULE BY MOUTH ONCE DAILY       Allergies:  Allergies  Allergen Reactions  . Poison Oak Extract Rash  . Poison Ivy Extract Rash    Family History: Family History  Problem Relation Age of Onset  . Heart failure Father   . Diabetes Father   . Hypertension Father   . Alzheimer's disease Father 60  . Other Mother 63       homicide  . Alcohol abuse Mother   . Pancreatic cancer Sister   . Alcohol abuse Sister   . Diabetes Brother   . Diabetes Paternal Aunt   . Diabetes Paternal Grandmother   . Diabetes Paternal Grandfather     Social History:  reports that he quit smoking about 53 years ago. His smoking use included cigarettes. He has a 2.00 pack-year smoking history. He has never used smokeless  tobacco. He reports previous alcohol use. He reports that he does not use drugs.   Physical Exam: BP 106/66   Pulse 67   Ht 6' (1.829 m)   Wt 282 lb (127.9 kg)   BMI 38.25 kg/m   Constitutional:  Alert and oriented, No acute distress. HEENT: [place] AT, moist mucus membranes.  Trachea midline, no masses. Cardiovascular: No clubbing, cyanosis, or edema. Respiratory: Normal respiratory effort, no increased work of breathing. Skin: No rashes, bruises or suspicious lesions. Neurologic: Grossly intact, no focal deficits, moving all 4 extremities. Psychiatric: Normal mood and affect.   Pertinent Imaging: August 2021 CT performed at an outside facility and images not available for review   Assessment & Plan:    1.  Metastatic castrate  resistant prostate cancer  Recent CT consistent with metastatic disease  With previous imaging negative  He has an appointment with Dr. [name] in medical oncology next month and will defer treatment recommendations to her  2. Gross hematuria  Schedule cystoscopy  3.  Lower urinary tract symptoms  Continue tamsulosin  Add Myrbetriq 25 mg daily-samples given  I spent 65 total minutes on the day of the encounter including pre-visit review of the medical record, face-to-face time with the patient, and post visit ordering of labs/imaging/tests.   Abbie Sons, [place] [address], [place], [place] [address] [phone]

## 2019-12-26 LAB — MICROSCOPIC EXAMINATION
Bacteria, UA: NONE SEEN
RBC, Urine: >30 /hpf — AB (ref 0–2)

## 2019-12-26 LAB — URINALYSIS, COMPLETE
Bilirubin, UA: NEGATIVE
Glucose, UA: NEGATIVE
Ketones, UA: NEGATIVE
Leukocytes,UA: NEGATIVE
Nitrite, UA: NEGATIVE
Specific Gravity, UA: 1.02 (ref 1.005–1.030)
Urobilinogen, Ur: 0.2 mg/dL (ref 0.2–1.0)
pH, UA: 6.5 (ref 5.0–7.5)

## 2019-12-27 MED FILL — NUBEQA 300 MG TABS: 300 | 30 days supply | Qty: 120 | Fill #1

## 2020-01-18 NOTE — H&P (View-Only) (Signed)
   01/19/2020  CC:  Chief Complaint  Patient presents with  . Cysto   Indications: Gross hematuria  HPI: Timothy Maddox is a 74 y.o. male who presents today for a cystoscopy.   Refer to previous note for details.   Since his last visit he complains of significant dysuria and intermittent urinary stream  Blood pressure 100/61, pulse 63, height 6' (1.829 m), weight 300 lb (136.1 kg). NED. A&Ox3.   No respiratory distress   Abd soft, NT, ND Normal phallus with bilateral descended testicles  Cystoscopy Procedure Note  Patient identification was confirmed, informed consent was obtained, and patient was prepped using Betadine solution.  Lidocaine jelly was administered per urethral meatus.     Pre-Procedure: - Inspection reveals a normal caliber urethral meatus.  Procedure: The flexible cystoscope was introduced without difficulty .   - No urethral strictures/lesions are present. - Moderately enlarged prostate with calcifications - Bilateral ureteral orifices identified - Bladder mucosa evaluation suboptimal secondary to patient discomfort.  There did appear to be calcifications, possible large clot within the bladder   Post-Procedure: - Patient tolerated the procedure well  Assessment/ Plan:  1. Metastatic castrate resistant prostate cancer  Recent CT consistent with metastatic disease With previous imaging negative Treatment was deferred to Dr. [name] in medical oncology   2. Gross hematuria Cystoscopy with difficulty today due to patient discomfort and sub-optimal visualization.  Will schedule cystoscopy under anesthesia for further evaluation with possible cystolitholapaxy, bladder biopsy, TURBT.  The procedure was cussed in detail including potential risks of bleeding, infection as well as anesthetic risks.  All questions were answered  3. Lower urinary tract symptoms  Will treat patient with pyridium to resolve burning with urination.  - Pyridium Rx sent.    Fransico Him, am acting as a scribe for Dr. Nicki Reaper C. Stoioff,  I have reviewed the above documentation for accuracy and completeness, and I agree with the above.   Abbie Sons, MD

## 2020-01-18 NOTE — Progress Notes (Signed)
   01/19/2020  CC:  Chief Complaint  Patient presents with  . Cysto   Indications: Gross hematuria  HPI: Timothy Maddox is a 74 y.o. male who presents today for a cystoscopy.   Refer to previous note for details.   Since his last visit he complains of significant dysuria and intermittent urinary stream  Blood pressure 100/61, pulse 63, height 6' (1.829 m), weight 300 lb (136.1 kg). NED. A&Ox3.   No respiratory distress   Abd soft, NT, ND Normal phallus with bilateral descended testicles  Cystoscopy Procedure Note  Patient identification was confirmed, informed consent was obtained, and patient was prepped using Betadine solution.  Lidocaine jelly was administered per urethral meatus.     Pre-Procedure: - Inspection reveals a normal caliber urethral meatus.  Procedure: The flexible cystoscope was introduced without difficulty .   - No urethral strictures/lesions are present. - Moderately enlarged prostate with calcifications - Bilateral ureteral orifices identified - Bladder mucosa evaluation suboptimal secondary to patient discomfort.  There did appear to be calcifications, possible large clot within the bladder   Post-Procedure: - Patient tolerated the procedure well  Assessment/ Plan:  1. Metastatic castrate resistant prostate cancer  Recent CT consistent with metastatic disease With previous imaging negative Treatment was deferred to Dr. [name] in medical oncology   2. Gross hematuria Cystoscopy with difficulty today due to patient discomfort and sub-optimal visualization.  Will schedule cystoscopy under anesthesia for further evaluation with possible cystolitholapaxy, bladder biopsy, TURBT.  The procedure was cussed in detail including potential risks of bleeding, infection as well as anesthetic risks.  All questions were answered  3. Lower urinary tract symptoms  Will treat patient with pyridium to resolve burning with urination.  - Pyridium Rx sent.    Fransico Him, am acting as a scribe for Dr. Nicki Reaper C. Stoioff,  I have reviewed the above documentation for accuracy and completeness, and I agree with the above.   Abbie Sons, MD

## 2020-01-19 ENCOUNTER — Encounter: Payer: Self-pay | Admitting: Urology

## 2020-01-19 ENCOUNTER — Other Ambulatory Visit: Payer: Self-pay

## 2020-01-19 ENCOUNTER — Ambulatory Visit: Payer: Medicare HMO | Admitting: Urology

## 2020-01-19 VITALS — BP 100/61 | HR 63 | Ht 72.0 in | Wt 300.0 lb

## 2020-01-19 DIAGNOSIS — R310 Gross hematuria: Secondary | ICD-10-CM

## 2020-01-19 MED ORDER — PHENAZOPYRIDINE HCL 200 MG PO TABS: 200.0000 mg | ORAL_TABLET | Freq: Three times a day (TID) | ORAL | 0 refills | Status: DC | PRN

## 2020-01-20 ENCOUNTER — Other Ambulatory Visit: Payer: Self-pay | Admitting: Radiology

## 2020-01-20 LAB — URINALYSIS, COMPLETE
Bilirubin, UA: NEGATIVE
Glucose, UA: NEGATIVE
Ketones, UA: NEGATIVE
Leukocytes,UA: NEGATIVE
Nitrite, UA: NEGATIVE
Specific Gravity, UA: 1.02 (ref 1.005–1.030)
Urobilinogen, Ur: 0.2 mg/dL (ref 0.2–1.0)
pH, UA: 5 (ref 5.0–7.5)

## 2020-01-20 LAB — MICROSCOPIC EXAMINATION: RBC, Urine: >30 /hpf — AB (ref 0–2)

## 2020-01-24 MED FILL — NUBEQA 300 MG TABS: 300 | 30 days supply | Qty: 120 | Fill #2

## 2020-01-26 ENCOUNTER — Other Ambulatory Visit: Payer: Self-pay

## 2020-01-26 DIAGNOSIS — R310 Gross hematuria: Secondary | ICD-10-CM

## 2020-01-27 ENCOUNTER — Other Ambulatory Visit: Payer: Medicare HMO

## 2020-01-27 ENCOUNTER — Other Ambulatory Visit: Payer: Self-pay

## 2020-01-27 DIAGNOSIS — R310 Gross hematuria: Secondary | ICD-10-CM

## 2020-01-30 ENCOUNTER — Inpatient Hospital Stay: Payer: Medicare HMO | Attending: Oncology

## 2020-01-30 ENCOUNTER — Encounter: Payer: Self-pay | Admitting: Oncology

## 2020-01-30 ENCOUNTER — Inpatient Hospital Stay (HOSPITAL_BASED_OUTPATIENT_CLINIC_OR_DEPARTMENT_OTHER): Payer: Medicare HMO | Admitting: Oncology

## 2020-01-30 ENCOUNTER — Other Ambulatory Visit: Payer: Self-pay

## 2020-01-30 ENCOUNTER — Telehealth: Payer: Self-pay

## 2020-01-30 ENCOUNTER — Inpatient Hospital Stay: Payer: Medicare HMO

## 2020-01-30 VITALS — BP 102/67 | HR 54 | Temp 96.7°F | Resp 18 | Wt 274.6 lb

## 2020-01-30 DIAGNOSIS — M899 Disorder of bone, unspecified: Secondary | ICD-10-CM | POA: Insufficient documentation

## 2020-01-30 DIAGNOSIS — C61 Malignant neoplasm of prostate: Secondary | ICD-10-CM | POA: Diagnosis present

## 2020-01-30 DIAGNOSIS — N1831 Chronic kidney disease, stage 3a: Secondary | ICD-10-CM

## 2020-01-30 DIAGNOSIS — R569 Unspecified convulsions: Secondary | ICD-10-CM

## 2020-01-30 DIAGNOSIS — Z923 Personal history of irradiation: Secondary | ICD-10-CM | POA: Insufficient documentation

## 2020-01-30 DIAGNOSIS — G8929 Other chronic pain: Secondary | ICD-10-CM | POA: Diagnosis not present

## 2020-01-30 DIAGNOSIS — I129 Hypertensive chronic kidney disease with stage 1 through stage 4 chronic kidney disease, or unspecified chronic kidney disease: Secondary | ICD-10-CM | POA: Insufficient documentation

## 2020-01-30 DIAGNOSIS — R972 Elevated prostate specific antigen [PSA]: Secondary | ICD-10-CM

## 2020-01-30 DIAGNOSIS — Z5111 Encounter for antineoplastic chemotherapy: Secondary | ICD-10-CM

## 2020-01-30 DIAGNOSIS — M549 Dorsalgia, unspecified: Secondary | ICD-10-CM | POA: Diagnosis not present

## 2020-01-30 DIAGNOSIS — Z79899 Other long term (current) drug therapy: Secondary | ICD-10-CM | POA: Insufficient documentation

## 2020-01-30 DIAGNOSIS — R319 Hematuria, unspecified: Secondary | ICD-10-CM | POA: Insufficient documentation

## 2020-01-30 DIAGNOSIS — I2510 Atherosclerotic heart disease of native coronary artery without angina pectoris: Secondary | ICD-10-CM | POA: Insufficient documentation

## 2020-01-30 DIAGNOSIS — Z79818 Long term (current) use of other agents affecting estrogen receptors and estrogen levels: Secondary | ICD-10-CM

## 2020-01-30 DIAGNOSIS — R300 Dysuria: Secondary | ICD-10-CM | POA: Insufficient documentation

## 2020-01-30 DIAGNOSIS — Z87891 Personal history of nicotine dependence: Secondary | ICD-10-CM | POA: Diagnosis not present

## 2020-01-30 DIAGNOSIS — R918 Other nonspecific abnormal finding of lung field: Secondary | ICD-10-CM | POA: Insufficient documentation

## 2020-01-30 LAB — COMPREHENSIVE METABOLIC PANEL
ALT: 14 U/L (ref 0–44)
AST: 18 U/L (ref 15–41)
Albumin: 3.8 g/dL (ref 3.5–5.0)
Alkaline Phosphatase: 53 U/L (ref 38–126)
Anion gap: 7 (ref 5–15)
BUN: 39 mg/dL — ABNORMAL HIGH (ref 8–23)
CO2: 25 mmol/L (ref 22–32)
Calcium: 9.2 mg/dL (ref 8.9–10.3)
Chloride: 104 mmol/L (ref 98–111)
Creatinine, Ser: 1.62 mg/dL — ABNORMAL HIGH (ref 0.61–1.24)
GFR calc Af Amer: 48 mL/min — ABNORMAL LOW (ref >60)
GFR calc non Af Amer: 41 mL/min — ABNORMAL LOW (ref >60)
Glucose, Bld: 133 mg/dL — ABNORMAL HIGH (ref 70–99)
Potassium: 4.9 mmol/L (ref 3.5–5.1)
Sodium: 136 mmol/L (ref 135–145)
Total Bilirubin: 0.6 mg/dL (ref 0.3–1.2)
Total Protein: 7.7 g/dL (ref 6.5–8.1)

## 2020-01-30 LAB — CBC WITH DIFFERENTIAL/PLATELET
Abs Immature Granulocytes: 0.02 10*3/uL (ref 0.00–0.07)
Basophils Absolute: 0 10*3/uL (ref 0.0–0.1)
Basophils Relative: 1 %
Eosinophils Absolute: 0.2 10*3/uL (ref 0.0–0.5)
Eosinophils Relative: 3 %
HCT: 37 % — ABNORMAL LOW (ref 39.0–52.0)
Hemoglobin: 12.4 g/dL — ABNORMAL LOW (ref 13.0–17.0)
Immature Granulocytes: 0 %
Lymphocytes Relative: 15 %
Lymphs Abs: 1 10*3/uL (ref 0.7–4.0)
MCH: 30.4 pg (ref 26.0–34.0)
MCHC: 33.5 g/dL (ref 30.0–36.0)
MCV: 90.7 fL (ref 80.0–100.0)
Monocytes Absolute: 0.8 10*3/uL (ref 0.1–1.0)
Monocytes Relative: 13 %
Neutro Abs: 4.5 10*3/uL (ref 1.7–7.7)
Neutrophils Relative %: 68 %
Platelets: 280 10*3/uL (ref 150–400)
RBC: 4.08 MIL/uL — ABNORMAL LOW (ref 4.22–5.81)
RDW: 12.8 % (ref 11.5–15.5)
WBC: 6.5 10*3/uL (ref 4.0–10.5)
nRBC: 0 % (ref 0.0–0.2)

## 2020-01-30 LAB — PSA: Prostatic Specific Antigen: 6.53 ng/mL — ABNORMAL HIGH (ref 0.00–4.00)

## 2020-01-30 MED ORDER — LEUPROLIDE ACETATE (3 MONTH) 22.5 MG ~~LOC~~ KIT
22.5000 mg | PACK | Freq: Once | SUBCUTANEOUS | Status: AC
  Administered 2020-01-30: 22.5 mg via SUBCUTANEOUS
  Filled 2020-01-30: qty 22.5

## 2020-01-30 NOTE — Progress Notes (Signed)
Patient has dysuria as well as hematuria.  Patient reports having a procedure preformed at urologist that has caused him to have pain 4/10 in the groin/penis.  Also is scheduled for surgery with Dr. [name] on 10/5 and they unsure what kind of surgery.

## 2020-01-30 NOTE — Telephone Encounter (Signed)
Done 1st avail for 10/07 per Abby from [place] has bee sched as requested. on 02/09/20 @ 330 Pt was made aware

## 2020-01-30 NOTE — Telephone Encounter (Signed)
-----   Message from Earlie Server, MD sent at 01/30/2020  2:12 PM EDT ----- Please schedule him for axumin PET and arrange him see me after image thanks. We discussed about this during today's visit. He knows the plan

## 2020-01-30 NOTE — Telephone Encounter (Signed)
Please schedule PET (first available) and MD follow up a few days after. Notify pt of appts. Thanks

## 2020-01-30 NOTE — Progress Notes (Signed)
Hematology/Oncology follow up note [facility] [phone] [phone]   Patient Care Team: [name], MD as PCP - General (Internal Medicine) [name], Ronda Fairly, MD (Urology)  REFERRING PROVIDER: Dr.Stoioff  REASON FOR VISIT:  Management of prostate cancer  HISTORY OF PRESENTING ILLNESS:  Timothy Maddox is a  74 y.o.  male with PMH listed below who was referred to me for evaluation of prostate cancer.  Prostate cancer diagnosis dated back to 2013.  I do  Not have his previous oncology medical records. Per Dr.Stoioff, he completed IMRT in February 2014 followed by androgen deprivation therapy. Patient previously followed Dr. Marian Sorrow at [facility] urology and then [facility] [place].  Recently he was seen by Dr. John Giovanni at [facility] urology.  Per urology notes, patient was diagnosed with a Gleason score 5+4 with 100% involvement in 6 out of 6 cores on the left and a 3 out of 6 cores on the right.  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  Bone scan was normal at that time.   Patient was treated with Lupron for about 18 months. PSA starts to rise.  August 2018 PSA was 9.3.  Patient had a CT and bone scan done was negative for metastatic disease.  Testosterone level was 53 in August 2018.  Patient was restarted on Lupron injection every 3 months since then.  December 2018 PSA decreased to 4.9 after ADT restarted. March 2019 PSA 5.4 Testosterone was checked in June 2019 within castration level.  Patient reports chronic back pain.  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  History of seizure, takes Roweepra 500 twice daily, Topiramate 5mg  BID. Marland Kitchen  darolutamide 600 mg twice daily since April 2020.  He tolerates well.  INTERVAL HISTORY CARLUS STAY is a 74 y.o. male who has above history reviewed by me today presents for follow-up visit for management of biochemical recurrence/nonmetastatic  castration resistant prostate cancer Patient developed hematuria during the interval and presented to see Dr. [name] urology Patient also had CT abdomen pelvis with and without contrast done on 12/08/2019 with results scanned into epic's. CT showed multiple bibasilar pulmonary nodules, measuring up to 3.5 cm in diameter highly suspicious for metastatic disease. Coronary artery calcifications and densely calcified mitral valve annulus. Normal-appearing noncontrasted liver, pancreas, spleen and adrenal glands. Multiple sclerotic lesions within the spine and the pelvis most likely representing metastatic prostate carcinoma. 1 cm calcified gallbladder calculus. 2.75 cm left lower pole parapelvic renal cyst. No renal mass or obstruction.  Diffusely thickened urinary bladder wall.  Patient continues to have dysuria.  He is on the schedule to have cystoscopy by Dr. [name] next week. Chronic back pain, unchanged.   Review of Systems  Constitutional: Negative for chills, fever, malaise/fatigue and weight loss.  HENT: Negative for sore throat.   Eyes: Negative for redness.  Respiratory: Negative for cough, shortness of breath and wheezing.   Cardiovascular: Negative for chest pain, palpitations and leg swelling.  Gastrointestinal: Negative for abdominal pain, blood in stool, nausea and vomiting.  Genitourinary: Positive for dysuria, frequency and hematuria.  Musculoskeletal: Positive for back pain. Negative for myalgias.  Skin: Negative for rash.  Neurological: Negative for dizziness, tingling and tremors.  Endo/Heme/Allergies: Does not bruise/bleed easily.  Psychiatric/Behavioral: Negative for hallucinations.    MEDICAL HISTORY:  Past Medical History:  Diagnosis Date  . Arthritis    lower back, shoulders  . AVM (arteriovenous malformation) brain   . Cancer ([place])  prostate- treated with radiation  . CVA (cerebral infarction) 11/2015  . Diabetes mellitus without complication ([place])   .  Dyspnea    with exertion  . HOH (hard of hearing)   . Hyperlipidemia   . Hypertension   . Pneumonia    1st grade  . Pre-diabetes   . Prediabetes   . Seizures ([place]) 12/2015  . Stroke [facility])    speech impairment, no weakness    SURGICAL HISTORY: Past Surgical History:  Procedure Laterality Date  . BACK SURGERY  1999   disectomy  . BACK SURGERY     2 surgery- injury  . COLONOSCOPY W/ POLYPECTOMY    . COLONOSCOPY WITH PROPOFOL N/A 02/28/2019   Procedure: COLONOSCOPY WITH PROPOFOL;  Surgeon: Toledo, Benay Pike, MD;  Location: [facility] ENDOSCOPY;  Service: Gastroenterology;  Laterality: N/A;  . HAMMER TOE SURGERY Bilateral   . INGUINAL HERNIA REPAIR Right    per patient when he was in 1st grade  . IR GENERIC HISTORICAL  12/05/2015   IR RADIOLOGIST EVAL & MGMT 12/05/2015 [facility]  . IR GENERIC HISTORICAL  01/31/2016   IR RADIOLOGIST EVAL & MGMT 01/31/2016 [facility]  . IR GENERIC HISTORICAL  02/20/2016   IR ANGIO INTRA EXTRACRAN SEL COM CAROTID INNOMINATE UNI L MOD SED 02/20/2016 [name], MD [facility]  . IR GENERIC HISTORICAL  02/20/2016   IR 3D INDEPENDENT WKST 02/20/2016 [name], MD [facility]  . KNEE SURGERY Bilateral 2015  . RADIOLOGY WITH ANESTHESIA N/A 02/20/2016   Procedure: EMBOLIZATION;  Surgeon: [name], MD;  Location: [facility];  Service: Radiology;  Laterality: N/A;  . TOE SURGERY Left 2017   TOE NAIL REMOVAL  . TOTAL KNEE ARTHROPLASTY Right 07/20/2017   Procedure: TOTAL KNEE ARTHROPLASTY;  Surgeon: Lovell Sheehan, MD;  Location: [facility] ORS;  Service: Orthopedics;  Laterality: Right;    SOCIAL HISTORY: Social History   Socioeconomic History  . Marital status: Married    Spouse name: Not on file  . Number of children: Not on file  . Years of education: Not on file  . Highest education level: Not on file  Occupational History  . Occupation: retired Curator  Tobacco Use  . Smoking status: Former Smoker    Packs/day: 1.00    Years:  2.00    Pack years: 2.00    Types: Cigarettes    Quit date: 05/05/1966    Years since quitting: 53.7  . Smokeless tobacco: Never Used  . Tobacco comment: quit age 32  Vaping Use  . Vaping Use: Never used  Substance and Sexual Activity  . Alcohol use: Not Currently  . Drug use: No  . Sexual activity: Not Currently  Other Topics Concern  . Not on file  Social History Narrative  . Not on file   Social Determinants of Health   Financial Resource Strain:   . Difficulty of Paying Living Expenses: Not on file  Food Insecurity:   . Worried About Charity fundraiser in the Last Year: Not on file  . Ran Out of Food in the Last Year: Not on file  Transportation Needs:   . Lack of Transportation (Medical): Not on file  . Lack of Transportation (Non-Medical): Not on file  Physical Activity:   . Days of Exercise per Week: Not on file  . Minutes of Exercise per Session: Not on file  Stress:   . Feeling of Stress : Not on file  Social Connections:   . Frequency of  Communication with Friends and Family: Not on file  . Frequency of Social Gatherings with Friends and Family: Not on file  . Attends Religious Services: Not on file  . Active Member of Clubs or Organizations: Not on file  . Attends Archivist Meetings: Not on file  . Marital Status: Not on file  Intimate Partner Violence:   . Fear of Current or Ex-Partner: Not on file  . Emotionally Abused: Not on file  . Physically Abused: Not on file  . Sexually Abused: Not on file    FAMILY HISTORY: Family History  Problem Relation Age of Onset  . Heart failure Father   . Diabetes Father   . Hypertension Father   . Alzheimer's disease Father 42  . Other Mother 59       homicide  . Alcohol abuse Mother   . Pancreatic cancer Sister   . Alcohol abuse Sister   . Diabetes Brother   . Diabetes Paternal Aunt   . Diabetes Paternal Grandmother   . Diabetes Paternal Grandfather     ALLERGIES:  is allergic to poison oak  extract and poison ivy extract.  MEDICATIONS:  Current Outpatient Medications  Medication Sig Dispense Refill  . ACCU-CHEK GUIDE test strip USE 1 STRIP TO CHECK BLOOD SUGAR ONCE DAILY IN THE MORNING    . acetaminophen (TYLENOL) 500 MG tablet Take 500 mg by mouth every 6 (six) hours as needed (pain.).     Marland Kitchen amLODipine (NORVASC) 10 MG tablet Take 10 mg by mouth daily.    Marland Kitchen levETIRAcetam (ROWEEPRA) 500 MG tablet Take 500 mg by mouth 2 (two) times daily.    Marland Kitchen losartan-hydrochlorothiazide (HYZAAR) 100-25 MG tablet Take 1 tablet by mouth daily.  1  . metFORMIN (GLUCOPHAGE) 500 MG tablet Take 500 mg by mouth 2 (two) times daily with a meal.    . metoprolol tartrate (LOPRESSOR) 100 MG tablet Take 100 mg by mouth 2 (two) times daily.    . NUBEQA 300 MG tablet TAKE 2 TABLETS (600MG ) BY MOUTH TWO TIMES DAILY WITH A MEAL. (Patient taking differently: Take 600 mg by mouth in the morning and at bedtime. ) 120 tablet 3  . phenazopyridine (PYRIDIUM) 200 MG tablet Take 1 tablet (200 mg total) by mouth 3 (three) times daily as needed (burning with urination). 30 tablet 0  . rosuvastatin (CRESTOR) 5 MG tablet Take 5 mg by mouth at bedtime.     Marland Kitchen spironolactone (ALDACTONE) 100 MG tablet Take 100 mg by mouth 2 (two) times daily.    Marland Kitchen gentamicin cream (GARAMYCIN) 0.1 % Apply 1 application topically 2 (two) times daily. (Patient not taking: Reported on 01/24/2020) 30 g 1  . tamsulosin (FLOMAX) 0.4 MG CAPS capsule TAKE ONE CAPSULE BY MOUTH ONCE DAILY (Patient not taking: Reported on 01/30/2020) 30 capsule 3   No current facility-administered medications for this visit.     PHYSICAL EXAMINATION: ECOG PERFORMANCE STATUS: 1 - Symptomatic but completely ambulatory Vitals:   01/30/20 0956  BP: 102/67  Pulse: (!) 54  Resp: 18  Temp: (!) 96.7 F (35.9 C)   Filed Weights   01/30/20 0956  Weight: 274 lb 9.6 oz (124.6 kg)    Physical Exam Constitutional:      General: He is not in acute distress.    Appearance:  He is obese.  HENT:     Head: Normocephalic and atraumatic.  Eyes:     General: No scleral icterus.    Pupils: Pupils are equal, round,  and reactive to light.  Cardiovascular:     Rate and Rhythm: Normal rate and regular rhythm.     Heart sounds: Normal heart sounds.  Pulmonary:     Effort: Pulmonary effort is normal. No respiratory distress.     Breath sounds: No wheezing.  Abdominal:     General: Bowel sounds are normal. There is no distension.     Palpations: Abdomen is soft. There is no mass.     Tenderness: There is no abdominal tenderness.  Musculoskeletal:        General: No deformity. Normal range of motion.     Cervical back: Normal range of motion and neck supple.  Skin:    General: Skin is warm and dry.     Findings: No erythema or rash.  Neurological:     Mental Status: He is alert and oriented to person, place, and time. Mental status is at baseline.     Cranial Nerves: No cranial nerve deficit.     Coordination: Coordination normal.  Psychiatric:        Mood and Affect: Mood normal.        Behavior: Behavior normal.        Thought Content: Thought content normal.      LABORATORY DATA:  I have reviewed the data as listed Lab Results  Component Value Date   WBC 6.5 01/30/2020   HGB 12.4 (L) 01/30/2020   HCT 37.0 (L) 01/30/2020   MCV 90.7 01/30/2020   PLT 280 01/30/2020   Recent Labs    08/09/19 1245 11/01/19 0925 01/30/20 0937  NA 137 136 136  K 4.0 4.1 4.9  CL 102 103 104  CO2 25 24 25   GLUCOSE 114* 133* 133*  BUN 19 25* 39*  CREATININE 1.24 1.45* 1.62*  CALCIUM 9.3 9.2 9.2  GFRNONAA 57* 47* 41*  GFRAA >60 55* 48*  PROT 7.2 7.5 7.7  ALBUMIN 3.9 4.1 3.8  AST 29 26 18   ALT 26 23 14   ALKPHOS 41 45 53  BILITOT 0.9 0.7 0.6    RADIOGRAPHIC STUDIES: I have personally reviewed the radiological images as listed and agreed with the findings in the report. 8/21/2020Bone scan showed uptake in the right knee surrounding prosthesis, degenerative  type uptake at the shoulders, sternoclavicular joints, lumbar spine left knee and feet.  Also uptake in the mid sternum.  No definitive worrisome of sites of abnormal osseous tracer accumulation suggesting osseous metastatic disease. CT abdomen pelvis without contrast showed brachy therapy seeds in the prostate, no findings suspicious for metastatic disease.    ASSESSMENT & PLAN:   1. Prostate cancer ([place])   2. Androgen deprivation therapy   3. Encounter for antineoplastic chemotherapy   4. Stage 3a chronic kidney disease ([place])   5. Seizure [facility])   Prostate cancer, biochemical recurrence, castration resistance. Patient tolerates darolutamide 600 mg twice daily Labs are reviewed and discussed with patient. Reviewed the recent CT image results. Concerning for cancer progression.  PSA was pending during his encounter and came back later as 6.53, consistent with castration resistant prostate cancer.  Will obtain Axumin PET scan for further evaluation.  Patient is due for androgen deprivation therapy.  He has tolerated well.  Proceed with Lupron 22.5 mg today.   We talked about options of chemotherapy and other oral agents.   # Hematuria, may or may not be related to prostate cancer. Pending cystoscopy to further determine that.  # Chronic kidney disease, his creatinine rises, Continue Flomax for  possible BPH symptoms.    Return of visit: to be determined.    Earlie Server, MD, PhD Hematology Oncology [facility] at [facility] Pager- [phone] 01/30/2020

## 2020-01-31 ENCOUNTER — Other Ambulatory Visit: Payer: Self-pay | Admitting: Radiology

## 2020-01-31 LAB — TESTOSTERONE: Testosterone: <3 ng/dL — ABNORMAL LOW (ref 264–916)

## 2020-01-31 NOTE — Telephone Encounter (Signed)
Please schedule MD follow up a few days after PET and notify pt of appt.

## 2020-01-31 NOTE — Telephone Encounter (Signed)
Done

## 2020-02-01 ENCOUNTER — Encounter
Admission: RE | Admit: 2020-02-01 | Discharge: 2020-02-01 | Disposition: A | Discharge status: Home / Self Care | Payer: Medicare HMO | Source: Ambulatory Visit | Attending: Urology | Admitting: Urology

## 2020-02-01 ENCOUNTER — Other Ambulatory Visit: Payer: Self-pay

## 2020-02-01 HISTORY — DX: Depression, unspecified: F32.A

## 2020-02-01 HISTORY — DX: Sleep apnea, unspecified: G47.30

## 2020-02-01 HISTORY — DX: Anxiety disorder, unspecified: F41.9

## 2020-02-01 NOTE — Patient Instructions (Signed)
Your procedure is scheduled on: 02/07/20 Report to [facility]. To find out your arrival time please call [phone] between 1PM - 3PM on 02/06/20.  Remember: Instructions that are not followed completely may result in serious medical risk, up to and including death, or upon the discretion of your surgeon and anesthesiologist your surgery may need to be rescheduled.     _X__ 1. Do not eat food after midnight the night before your procedure.                 No gum chewing or hard candies. You may drink clear liquids up to 2 hours                 before you are scheduled to arrive for your surgery- DO not drink clear                 liquids within 2 hours of the start of your surgery.                 Clear Liquids include:  water, apple juice without pulp, clear carbohydrate                 drink such as Clearfast or Gatorade, Black Coffee or Tea (Do not add                 anything to coffee or tea). Diabetics water only  __X__2.  On the morning of surgery brush your teeth with toothpaste and water, you                 may rinse your mouth with mouthwash if you wish.  Do not swallow any              toothpaste of mouthwash.     _X__ 3.  No Alcohol for 24 hours before or after surgery.   _X__ 4.  Do Not Smoke or use e-cigarettes For 24 Hours Prior to Your Surgery.                 Do not use any chewable tobacco products for at least 6 hours prior to                 surgery.  ____  5.  Bring all medications with you on the day of surgery if instructed.   __X__  6.  Notify your doctor if there is any change in your medical condition      (cold, fever, infections).     Do not wear jewelry, make-up, hairpins, clips or nail polish. Do not wear lotions, powders, or perfumes.  Do not shave 48 hours prior to surgery. Men may shave face and neck. Do not bring valuables to the hospital.    [facility] is not responsible for any belongings or  valuables.  Contacts, dentures/partials or body piercings may not be worn into surgery. Bring a case for your contacts, glasses or hearing aids, a denture cup will be supplied. Leave your suitcase in the car. After surgery it may be brought to your room. For patients admitted to the hospital, discharge time is determined by your treatment team.   Patients discharged the day of surgery will not be allowed to drive home.   Please read over the following fact sheets that you were given:   MRSA Information  __X__ Take these medicines the morning of surgery with A SIP OF WATER:  1. amLODipine (NORVASC) 10 MG tablet  2. levETIRAcetam (ROWEEPRA) 500 MG tablet  3. metoprolol tartrate (LOPRESSOR) 100 MG tablet  4. tamsulosin (FLOMAX) 0.4 MG CAPS capsule  5. NUBEQA 600 MG tablet  6.  ____ Fleet Enema (as directed)   ____ Use CHG Soap/SAGE wipes as directed  ____ Use inhalers on the day of surgery  __X__ Stop metformin/Janumet/Farxiga 2 days prior to surgery    ____ Take 1/2 of usual insulin dose the night before surgery. No insulin the morning          of surgery.   ____ Stop Blood Thinners Coumadin/Plavix/Xarelto/Pleta/Pradaxa/Eliquis/Effient/Aspirin  on   Or contact your Surgeon, Cardiologist or Medical Doctor regarding  ability to stop your blood thinners  __X__ Stop Anti-inflammatories 7 days before surgery such as Advil, Ibuprofen, Motrin,  BC or Goodies Powder, Naprosyn, Naproxen, Aleve, Aspirin    __X__ Stop all herbal supplements, fish oil or vitamin E until after surgery.    ____ Bring C-Pap to the hospital.

## 2020-02-01 NOTE — Pre-Procedure Instructions (Signed)
Patient seeing PCP today. Office staff Selena Batten) notified patient will need EKG and medical clearnace. Notified our NP B Pearline Cables , he will send clearance and EKG request to PCP.

## 2020-02-02 ENCOUNTER — Other Ambulatory Visit: Payer: Self-pay | Admitting: *Deleted

## 2020-02-02 LAB — CULTURE, URINE COMPREHENSIVE

## 2020-02-02 MED ORDER — SULFAMETHOXAZOLE-TRIMETHOPRIM 800-160 MG PO TABS: 1.0000 | ORAL_TABLET | Freq: Two times a day (BID) | ORAL | 0 refills | Status: AC

## 2020-02-02 MED ORDER — SULFAMETHOXAZOLE-TRIMETHOPRIM 800-160 MG PO TABS: 1.0000 | ORAL_TABLET | Freq: Two times a day (BID) | ORAL | 0 refills | Status: DC

## 2020-02-03 ENCOUNTER — Other Ambulatory Visit: Payer: Self-pay | Admitting: Radiology

## 2020-02-03 ENCOUNTER — Other Ambulatory Visit
Admission: RE | Admit: 2020-02-03 | Discharge: 2020-02-03 | Disposition: A | Discharge status: Home / Self Care | Payer: Medicare HMO | Source: Ambulatory Visit | Attending: Urology | Admitting: Urology

## 2020-02-03 DIAGNOSIS — Z20822 Contact with and (suspected) exposure to covid-19: Secondary | ICD-10-CM | POA: Diagnosis not present

## 2020-02-03 DIAGNOSIS — Z01812 Encounter for preprocedural laboratory examination: Secondary | ICD-10-CM | POA: Insufficient documentation

## 2020-02-04 LAB — SARS CORONAVIRUS 2 (TAT 6-24 HRS): SARS Coronavirus 2: NEGATIVE

## 2020-02-06 ENCOUNTER — Other Ambulatory Visit: Payer: Self-pay | Admitting: Oncology

## 2020-02-07 ENCOUNTER — Other Ambulatory Visit: Payer: Self-pay

## 2020-02-07 ENCOUNTER — Ambulatory Visit: Payer: Medicare HMO | Admitting: Urgent Care

## 2020-02-07 ENCOUNTER — Ambulatory Visit: Payer: Medicare HMO

## 2020-02-07 ENCOUNTER — Encounter: Payer: Self-pay | Admitting: Urology

## 2020-02-07 ENCOUNTER — Ambulatory Visit
Admission: RE | Admit: 2020-02-07 | Discharge: 2020-02-07 | Disposition: A | Discharge status: Home / Self Care | Payer: Medicare HMO | Source: Ambulatory Visit | Attending: Urology | Admitting: Urology

## 2020-02-07 ENCOUNTER — Encounter
Admission: RE | Disposition: A | Discharge status: Home / Self Care | Payer: Self-pay | Source: Ambulatory Visit | Attending: Urology

## 2020-02-07 DIAGNOSIS — N35819 Other urethral stricture, male, unspecified site: Secondary | ICD-10-CM | POA: Diagnosis not present

## 2020-02-07 DIAGNOSIS — C7982 Secondary malignant neoplasm of genital organs: Secondary | ICD-10-CM | POA: Insufficient documentation

## 2020-02-07 DIAGNOSIS — C801 Malignant (primary) neoplasm, unspecified: Secondary | ICD-10-CM | POA: Diagnosis not present

## 2020-02-07 DIAGNOSIS — N139 Obstructive and reflux uropathy, unspecified: Secondary | ICD-10-CM | POA: Diagnosis not present

## 2020-02-07 DIAGNOSIS — R300 Dysuria: Secondary | ICD-10-CM

## 2020-02-07 DIAGNOSIS — R310 Gross hematuria: Secondary | ICD-10-CM | POA: Diagnosis present

## 2020-02-07 DIAGNOSIS — C61 Malignant neoplasm of prostate: Secondary | ICD-10-CM

## 2020-02-07 HISTORY — PX: CYSTOSCOPY WITH URETHRAL DILATATION: SHX5125

## 2020-02-07 LAB — GLUCOSE, CAPILLARY
Glucose-Capillary: 133 mg/dL — ABNORMAL HIGH (ref 70–99)
Glucose-Capillary: 126 mg/dL — ABNORMAL HIGH (ref 70–99)

## 2020-02-07 SURGERY — CYSTOSCOPY, WITH URETHRAL DILATION
Anesthesia: General

## 2020-02-07 MED ORDER — OXYCODONE-ACETAMINOPHEN 5-325 MG PO TABS
ORAL_TABLET | ORAL | Status: AC
  Administered 2020-02-07: 1 via ORAL
  Filled 2020-02-07: qty 1

## 2020-02-07 MED ORDER — GLYCOPYRROLATE 0.2 MG/ML IJ SOLN
INTRAMUSCULAR | Status: DC | PRN
  Administered 2020-02-07 (×2): .2 mg via INTRAVENOUS

## 2020-02-07 MED ORDER — OXYCODONE-ACETAMINOPHEN 5-325 MG PO TABS: 1.0000 | ORAL_TABLET | Freq: Three times a day (TID) | ORAL | 0 refills | Status: AC | PRN

## 2020-02-07 MED ORDER — ONDANSETRON HCL 4 MG/2ML IJ SOLN: 4.0000 mg | Freq: Once | INTRAMUSCULAR | Status: AC | PRN

## 2020-02-07 MED ORDER — CHLORHEXIDINE GLUCONATE 0.12 % MT SOLN
OROMUCOSAL | Status: AC
  Administered 2020-02-07: 15 mL via OROMUCOSAL
  Filled 2020-02-07: qty 15

## 2020-02-07 MED ORDER — FENTANYL CITRATE (PF) 100 MCG/2ML IJ SOLN
INTRAMUSCULAR | Status: DC | PRN
Start: 2020-02-07 — End: 2020-02-07
  Administered 2020-02-07 (×2): 50 ug via INTRAVENOUS

## 2020-02-07 MED ORDER — LEVOFLOXACIN IN D5W 500 MG/100ML IV SOLN
INTRAVENOUS | Status: AC
  Filled 2020-02-07: qty 100

## 2020-02-07 MED ORDER — ONDANSETRON HCL 4 MG/2ML IJ SOLN
INTRAMUSCULAR | Status: DC | PRN
  Administered 2020-02-07: 4 mg via INTRAVENOUS

## 2020-02-07 MED ORDER — OXYCODONE-ACETAMINOPHEN 5-325 MG PO TABS: 1.0000 | ORAL_TABLET | Freq: Three times a day (TID) | ORAL | Status: DC | PRN

## 2020-02-07 MED ORDER — CHLORHEXIDINE GLUCONATE 0.12 % MT SOLN: 15.0000 mL | Freq: Once | OROMUCOSAL | Status: AC

## 2020-02-07 MED ORDER — EPHEDRINE 5 MG/ML INJ
INTRAVENOUS | Status: AC
  Filled 2020-02-07: qty 10

## 2020-02-07 MED ORDER — ORAL CARE MOUTH RINSE: 15.0000 mL | Freq: Once | OROMUCOSAL | Status: AC

## 2020-02-07 MED ORDER — LEVOFLOXACIN IN D5W 500 MG/100ML IV SOLN
INTRAVENOUS | Status: DC | PRN
  Administered 2020-02-07: 500 mg via INTRAVENOUS

## 2020-02-07 MED ORDER — SODIUM CHLORIDE 0.9 % IV SOLN: INTRAVENOUS | Status: DC

## 2020-02-07 MED ORDER — FAMOTIDINE 20 MG PO TABS
ORAL_TABLET | ORAL | Status: AC
  Administered 2020-02-07: 20 mg via ORAL
  Filled 2020-02-07: qty 1

## 2020-02-07 MED ORDER — ONDANSETRON HCL 4 MG/2ML IJ SOLN
INTRAMUSCULAR | Status: AC
  Administered 2020-02-07: 4 mg via INTRAVENOUS
  Filled 2020-02-07: qty 2

## 2020-02-07 MED ORDER — FAMOTIDINE 20 MG PO TABS: 20.0000 mg | ORAL_TABLET | Freq: Once | ORAL | Status: AC

## 2020-02-07 MED ORDER — FENTANYL CITRATE (PF) 100 MCG/2ML IJ SOLN
25.0000 ug | INTRAMUSCULAR | Status: DC | PRN
  Administered 2020-02-07 (×3): 25 ug via INTRAVENOUS

## 2020-02-07 MED ORDER — FENTANYL CITRATE (PF) 100 MCG/2ML IJ SOLN
INTRAMUSCULAR | Status: AC
  Administered 2020-02-07: 25 ug via INTRAVENOUS
  Filled 2020-02-07: qty 2

## 2020-02-07 MED ORDER — LIDOCAINE HCL (CARDIAC) PF 100 MG/5ML IV SOSY
PREFILLED_SYRINGE | INTRAVENOUS | Status: DC | PRN
  Administered 2020-02-07: 100 mg via INTRAVENOUS

## 2020-02-07 MED ORDER — FENTANYL CITRATE (PF) 100 MCG/2ML IJ SOLN
INTRAMUSCULAR | Status: AC
  Filled 2020-02-07: qty 2

## 2020-02-07 MED ORDER — PROPOFOL 10 MG/ML IV BOLUS
INTRAVENOUS | Status: DC | PRN
  Administered 2020-02-07: 30 mg via INTRAVENOUS
  Administered 2020-02-07: 150 mg via INTRAVENOUS

## 2020-02-07 SURGICAL SUPPLY — 47 items
BAG DRAIN CYSTO-URO LG1000N (MISCELLANEOUS) ×4 IMPLANT
BAG DRN RND TRDRP ANRFLXCHMBR (UROLOGICAL SUPPLIES) ×2
BAG URINE DRAIN 2000ML AR STRL (UROLOGICAL SUPPLIES) ×4 IMPLANT
BASKET ZERO TIP 1.9FR (BASKET) IMPLANT
BRUSH SCRUB EZ  4% CHG (MISCELLANEOUS)
BRUSH SCRUB EZ 1% IODOPHOR (MISCELLANEOUS) ×4 IMPLANT
BRUSH SCRUB EZ 4% CHG (MISCELLANEOUS) IMPLANT
BSKT STON RTRVL ZERO TP 1.9FR (BASKET)
CATH FOL 2WAY LX 18X30 (CATHETERS) IMPLANT
CATH FOLEY 2W COUNCIL 20FR 5CC (CATHETERS) ×4 IMPLANT
CATH FOLEY 2W COUNCIL 5CC 18FR (CATHETERS) ×4 IMPLANT
CATH FOLEY 2WAY 18X30 (CATHETERS) IMPLANT
CATH FOLEY 2WAY SIL 18X30 (CATHETERS)
CATH SET URETHRAL DILATOR (CATHETERS) ×4 IMPLANT
CATH URETL 5X70 OPEN END (CATHETERS) ×4 IMPLANT
DRAPE UTILITY 15X26 TOWEL STRL (DRAPES) ×4 IMPLANT
DRSG TELFA 4X3 1S NADH ST (GAUZE/BANDAGES/DRESSINGS) ×4 IMPLANT
ELECT LOOP 22F BIPOLAR SML (ELECTROSURGICAL)
ELECT REM PT RETURN 9FT ADLT (ELECTROSURGICAL) ×4
ELECTRODE LOOP 22F BIPOLAR SML (ELECTROSURGICAL) IMPLANT
ELECTRODE REM PT RTRN 9FT ADLT (ELECTROSURGICAL) ×2 IMPLANT
FIBER LASER FLEXIVA 1000 (UROLOGICAL SUPPLIES) IMPLANT
GLOVE BIOGEL PI IND STRL 7.5 (GLOVE) ×2 IMPLANT
GLOVE BIOGEL PI INDICATOR 7.5 (GLOVE) ×2
GOWN STRL REUS W/ TWL LRG LVL3 (GOWN DISPOSABLE) ×4 IMPLANT
GOWN STRL REUS W/ TWL XL LVL3 (GOWN DISPOSABLE) ×2 IMPLANT
GOWN STRL REUS W/TWL LRG LVL3 (GOWN DISPOSABLE) ×8
GOWN STRL REUS W/TWL XL LVL3 (GOWN DISPOSABLE) ×8 IMPLANT
GOWN STRL REUS W/TWL XL LVL4 (GOWN DISPOSABLE) ×4 IMPLANT
GUIDEWIRE STR DUAL SENSOR (WIRE) ×4 IMPLANT
IV NS IRRIG 3000ML ARTHROMATIC (IV SOLUTION) ×8 IMPLANT
KIT PROBE TRILOGY 3.9X350 (MISCELLANEOUS) IMPLANT
KIT TURNOVER CYSTO (KITS) ×4 IMPLANT
LOOP CUT BIPOLAR 24F LRG (ELECTROSURGICAL) IMPLANT
NDL SAFETY ECLIPSE 18X1.5 (NEEDLE) ×2 IMPLANT
NEEDLE HYPO 18GX1.5 SHARP (NEEDLE) ×4
PACK CYSTO AR (MISCELLANEOUS) ×4 IMPLANT
SET CYSTO W/LG BORE CLAMP LF (SET/KITS/TRAYS/PACK) ×4 IMPLANT
SET IRRIG Y TYPE TUR BLADDER L (SET/KITS/TRAYS/PACK) ×4 IMPLANT
SOL .9 NS 3000ML IRR  AL (IV SOLUTION) ×2
SOL .9 NS 3000ML IRR AL (IV SOLUTION) ×2
SOL .9 NS 3000ML IRR UROMATIC (IV SOLUTION) ×2 IMPLANT
SURGILUBE 2OZ TUBE FLIPTOP (MISCELLANEOUS) ×4 IMPLANT
SYR TOOMEY IRRIG 70ML (MISCELLANEOUS) ×4
SYRINGE TOOMEY IRRIG 70ML (MISCELLANEOUS) ×2 IMPLANT
WATER STERILE IRR 1000ML POUR (IV SOLUTION) ×4 IMPLANT
WATER STERILE IRR 3000ML UROMA (IV SOLUTION) ×4 IMPLANT

## 2020-02-07 NOTE — Transfer of Care (Signed)
Immediate Anesthesia Transfer of Care Note  Patient: Timothy Maddox  Procedure(s) Performed: CYSTOSCOPY WITH URETHRAL DILATATION (Bilateral )  Patient Location: PACU  Anesthesia Type:General  Level of Consciousness: drowsy and patient cooperative  Airway & Oxygen Therapy: Patient connected to face mask oxygen  Post-op Assessment: Report given to RN and Post -op Vital signs reviewed and stable  Post vital signs: Reviewed and stable  Last Vitals:  Vitals Value Taken Time  BP 127/78 02/07/20 1300  Temp    Pulse 60 02/07/20 1302  Resp 15 02/07/20 1302  SpO2 96 % 02/07/20 1302  Vitals shown include unvalidated device data.  Last Pain:  Vitals:   02/07/20 1001  TempSrc: Temporal  PainSc: 4          Complications: No complications documented.

## 2020-02-07 NOTE — Op Note (Signed)
Preoperative diagnosis:  1. Gross hematuria 2. Dysuria 3. Obstructive voiding symptoms 4. Metastatic prostate cancer  Postoperative diagnosis:  1. Same  Procedure: 1. Cystoscopy with urethral dilation  Surgeon: Abbie Sons, MD  Anesthesia: General  Complications: None  Intraoperative findings:  1. Diffuse transurethral narrowing 2. Fixed bladder neck 3. Flexible cystoscopy was suboptimal visualization  EBL: Minimal  Specimens: None  Indication: Timothy Maddox is a 74 y.o. patient with metastatic prostate cancer and recent development of gross hematuria, significant dysuria and obstructive voiding symptoms.  Office cystoscopy remarkable for a tight urethra and visualization was suboptimal secondary to patient discomfort.  A CT performed in early August at an outside facility showed multiple pulmonary nodules, sclerotic lesions of the spine and pelvis and bladder wall thickening.  He is followed by medical oncology and an Axumin scan has been scheduled.  After reviewing the management options for treatment, he elected to proceed with the above surgical procedure(s). We have discussed the potential benefits and risks of the procedure, side effects of the proposed treatment, the likelihood of the patient achieving the goals of the procedure, and any potential problems that might occur during the procedure or recuperation. Informed consent has been obtained.  Description of procedure:  The patient was taken to the operating room and general anesthesia was induced.  The patient was placed in the dorsal lithotomy position, prepped and draped in the usual sterile fashion, and preoperative antibiotics were administered. A preoperative time-out was performed.   A 21 French cystoscope was lubricated and passed per urethra however could not be advanced beyond 3 cm.  The urethra mucosa was erythematous and friable.  The cystoscope was removed and a flexible cystoscope was passed per urethra.   The urethra was again diffusely tight with bladder neck elevation however could be advanced into the bladder old appearing clots were present in the bladder however visualization was suboptimal with the flexible cystoscope.  A 0.038 Sensor wire was placed to the flexible cystoscope and coiled within the bladder.  The cystoscope was removed and over the wire urethral dilators were placed from 14-20 [place] with resistance met in the region of the bladder neck/prostatic urethra.  A 21 French cystoscope was placed over the wire however still met resistance in the penile urethra.  A 17 French cystoscope was able to be advanced to the region of the prostatic urethra however could not be advanced into the bladder.  It was elected at this point to terminate the procedure.  A 20 French Councill catheter could not be advanced over the wire however an 57 French Councill catheter was placed without difficulty.  Blood-tinged urine was drained and the catheter was irrigated with a Toomey syringe with return of pink-tinged effluent and a few small old appearing clots.  The corpus spongiosum was also noted to be indurated and enlarged.  Plan: Foley catheter will remain indwelling.  He is scheduled for an Axumin scan later this week and depending on findings consider follow-up cystoscopy after his catheter has been indwelling for least 2 weeks.  Abbie Sons, M.D.

## 2020-02-07 NOTE — Discharge Instructions (Signed)

## 2020-02-07 NOTE — Anesthesia Preprocedure Evaluation (Signed)
Anesthesia Evaluation  Patient identified by MRN, date of birth, ID band Patient awake    Reviewed: Allergy & Precautions, NPO status , Patient's Chart, lab work & pertinent test results  History of Anesthesia Complications Negative for: history of anesthetic complications  Airway Mallampati: III       Dental  (+) Edentulous Upper, Edentulous Lower, Upper Dentures, Lower Dentures   Pulmonary neg pulmonary ROS, neg shortness of breath, neg sleep apnea, neg COPD, neg recent URI, Not current smoker, former smoker,           Cardiovascular hypertension, Pt. on medications (-) angina(-) Past MI and (-) CHF (-) dysrhythmias (-) Valvular Problems/Murmurs     Neuro/Psych Seizures - (with CVA),  PSYCHIATRIC DISORDERS Anxiety Depression CVA (speech difficulties), Residual Symptoms    GI/Hepatic Neg liver ROS, neg GERD  ,  Endo/Other  diabetes, Type 2, Oral Hypoglycemic Agents  Renal/GU CRFRenal disease     Musculoskeletal   Abdominal   Peds  Hematology   Anesthesia Other Findings Past Medical History: No date: Anxiety No date: Arthritis     Comment:  lower back, shoulders No date: AVM (arteriovenous malformation) brain No date: Cancer [facility])     Comment:  prostate- treated with radiation 11/2015: CVA (cerebral infarction) No date: Depression No date: Diabetes mellitus without complication (HCC) No date: Dyspnea     Comment:  with exertion No date: HOH (hard of hearing) No date: Hyperlipidemia No date: Hypertension No date: Pneumonia     Comment:  1st grade No date: Pre-diabetes No date: Prediabetes 12/2015: Seizures ([place]) No date: Sleep apnea No date: Stroke [facility])     Comment:  speech impairment, no weakness   Reproductive/Obstetrics                             Anesthesia Physical  Anesthesia Plan  ASA: III  Anesthesia Plan: General   Post-op Pain Management:    Induction:  Intravenous  PONV Risk Score and Plan: 2 and Ondansetron, Dexamethasone and Treatment may vary due to age or medical condition  Airway Management Planned: LMA  Additional Equipment:   Intra-op Plan:   Post-operative Plan: Extubation in OR  Informed Consent: I have reviewed the patients History and Physical, chart, labs and discussed the procedure including the risks, benefits and alternatives for the proposed anesthesia with the patient or authorized representative who has indicated his/her understanding and acceptance.       Plan Discussed with:   Anesthesia Plan Comments:         Anesthesia Quick Evaluation

## 2020-02-07 NOTE — Interval H&P Note (Signed)
History and Physical Interval Note:  CV: RRR Lungs: Clear  02/07/2020 11:27 AM  Timothy Maddox  has presented today for surgery, with the diagnosis of gross hematuria, prostate cancer.  The various methods of treatment have been discussed with the patient and family. After consideration of risks, benefits and other options for treatment, the patient has consented to  Procedure(s): CYSTOSCOPY WITH RETROGRADE PYELOGRAM (Bilateral) CYSTOSCOPY WITH BIOPSY (N/A) TRANSURETHRAL RESECTION OF BLADDER TUMOR (TURBT) (N/A) CYSTOSCOPY WITH LITHOLAPAXY (N/A) as a surgical intervention.  The patient's history has been reviewed, patient examined, no change in status, stable for surgery.  I have reviewed the patient's chart and labs.  Questions were answered to the patient's satisfaction.     [place]

## 2020-02-07 NOTE — Anesthesia Procedure Notes (Signed)
Procedure Name: LMA Insertion Performed by: Staci Acosta, CRNA Pre-anesthesia Checklist: Patient identified, Patient being monitored, Timeout performed, Emergency Drugs available and Suction available Patient Re-evaluated:Patient Re-evaluated prior to induction Oxygen Delivery Method: Circle system utilized Preoxygenation: Pre-oxygenation with 100% oxygen Induction Type: IV induction Ventilation: Mask ventilation without difficulty LMA: LMA inserted LMA Size: 4.5 Tube type: Oral Number of attempts: 1 Placement Confirmation: positive ETCO2 and breath sounds checked- equal and bilateral Tube secured with: Tape Dental Injury: Teeth and Oropharynx as per pre-operative assessment

## 2020-02-08 ENCOUNTER — Encounter: Payer: Self-pay | Admitting: Urology

## 2020-02-08 ENCOUNTER — Telehealth: Payer: Self-pay | Admitting: Oncology

## 2020-02-08 NOTE — Telephone Encounter (Signed)
Pt called to cancel his PET scan and follow-up. Team notified via Secure chat since pt appt is scheduled for 10/7. Advised on resheduling of appts.

## 2020-02-08 NOTE — Anesthesia Postprocedure Evaluation (Signed)
Anesthesia Post Note  Patient: Samul Dada  Procedure(s) Performed: CYSTOSCOPY WITH URETHRAL DILATATION (Bilateral )  Patient location during evaluation: PACU Anesthesia Type: General Level of consciousness: awake and alert Pain management: pain level controlled Vital Signs Assessment: post-procedure vital signs reviewed and stable Respiratory status: spontaneous breathing, nonlabored ventilation, respiratory function stable and patient connected to nasal cannula oxygen Cardiovascular status: blood pressure returned to baseline and stable Postop Assessment: no apparent nausea or vomiting Anesthetic complications: no   No complications documented.   Last Vitals:  Vitals:   02/07/20 1543 02/07/20 1646  BP: 109/60 124/80  Pulse: (!) 55 (!) 57  Resp: 16 16  Temp:    SpO2: 94% 95%    Last Pain:  Vitals:   02/08/20 1114  TempSrc:   PainSc: 8                  Martha Clan

## 2020-02-09 ENCOUNTER — Telehealth: Payer: Self-pay | Admitting: Oncology

## 2020-02-09 ENCOUNTER — Encounter: Admission: RE | Admit: 2020-02-09 | Payer: Medicare HMO | Source: Ambulatory Visit

## 2020-02-09 NOTE — Telephone Encounter (Signed)
02/09/2020  Pt previously r/s his PET scan from 02/09/20 to 02/16/20. F/u MD appt moved accordingly to 02/20/20 @ 2:45 SRW

## 2020-02-09 NOTE — Telephone Encounter (Signed)
Writer phoned patient on this date and left voicemail to inform that PET scan on 02-09-20 and MD follow up have been rescheduled per patient's request. Writer left new appt information on voicemail.

## 2020-02-12 ENCOUNTER — Other Ambulatory Visit: Payer: Self-pay

## 2020-02-12 DIAGNOSIS — R140 Abdominal distension (gaseous): Secondary | ICD-10-CM | POA: Diagnosis not present

## 2020-02-12 DIAGNOSIS — N1831 Chronic kidney disease, stage 3a: Secondary | ICD-10-CM | POA: Diagnosis present

## 2020-02-12 DIAGNOSIS — G4730 Sleep apnea, unspecified: Secondary | ICD-10-CM | POA: Diagnosis present

## 2020-02-12 DIAGNOSIS — N179 Acute kidney failure, unspecified: Secondary | ICD-10-CM | POA: Diagnosis present

## 2020-02-12 DIAGNOSIS — I358 Other nonrheumatic aortic valve disorders: Secondary | ICD-10-CM | POA: Diagnosis present

## 2020-02-12 DIAGNOSIS — E785 Hyperlipidemia, unspecified: Secondary | ICD-10-CM | POA: Diagnosis present

## 2020-02-12 DIAGNOSIS — Z7984 Long term (current) use of oral hypoglycemic drugs: Secondary | ICD-10-CM

## 2020-02-12 DIAGNOSIS — Y846 Urinary catheterization as the cause of abnormal reaction of the patient, or of later complication, without mention of misadventure at the time of the procedure: Secondary | ICD-10-CM | POA: Diagnosis present

## 2020-02-12 DIAGNOSIS — H9190 Unspecified hearing loss, unspecified ear: Secondary | ICD-10-CM | POA: Diagnosis present

## 2020-02-12 DIAGNOSIS — C7800 Secondary malignant neoplasm of unspecified lung: Secondary | ICD-10-CM | POA: Diagnosis present

## 2020-02-12 DIAGNOSIS — Z79899 Other long term (current) drug therapy: Secondary | ICD-10-CM

## 2020-02-12 DIAGNOSIS — Z20822 Contact with and (suspected) exposure to covid-19: Secondary | ICD-10-CM | POA: Diagnosis present

## 2020-02-12 DIAGNOSIS — T83511A Infection and inflammatory reaction due to indwelling urethral catheter, initial encounter: Principal | ICD-10-CM | POA: Diagnosis present

## 2020-02-12 DIAGNOSIS — Z66 Do not resuscitate: Secondary | ICD-10-CM | POA: Diagnosis not present

## 2020-02-12 DIAGNOSIS — I4891 Unspecified atrial fibrillation: Secondary | ICD-10-CM | POA: Diagnosis present

## 2020-02-12 DIAGNOSIS — Z96651 Presence of right artificial knee joint: Secondary | ICD-10-CM | POA: Diagnosis present

## 2020-02-12 DIAGNOSIS — I130 Hypertensive heart and chronic kidney disease with heart failure and stage 1 through stage 4 chronic kidney disease, or unspecified chronic kidney disease: Secondary | ICD-10-CM | POA: Diagnosis present

## 2020-02-12 DIAGNOSIS — C61 Malignant neoplasm of prostate: Secondary | ICD-10-CM | POA: Diagnosis present

## 2020-02-12 DIAGNOSIS — Z800 Family history of malignant neoplasm of digestive organs: Secondary | ICD-10-CM

## 2020-02-12 DIAGNOSIS — Z515 Encounter for palliative care: Secondary | ICD-10-CM

## 2020-02-12 DIAGNOSIS — Z751 Person awaiting admission to adequate facility elsewhere: Secondary | ICD-10-CM

## 2020-02-12 DIAGNOSIS — I2699 Other pulmonary embolism without acute cor pulmonale: Secondary | ICD-10-CM | POA: Diagnosis present

## 2020-02-12 DIAGNOSIS — D630 Anemia in neoplastic disease: Secondary | ICD-10-CM | POA: Diagnosis present

## 2020-02-12 DIAGNOSIS — I5032 Chronic diastolic (congestive) heart failure: Secondary | ICD-10-CM | POA: Diagnosis present

## 2020-02-12 DIAGNOSIS — Z8249 Family history of ischemic heart disease and other diseases of the circulatory system: Secondary | ICD-10-CM

## 2020-02-12 DIAGNOSIS — B957 Other staphylococcus as the cause of diseases classified elsewhere: Secondary | ICD-10-CM | POA: Diagnosis present

## 2020-02-12 DIAGNOSIS — D500 Iron deficiency anemia secondary to blood loss (chronic): Secondary | ICD-10-CM | POA: Diagnosis present

## 2020-02-12 DIAGNOSIS — Z820 Family history of epilepsy and other diseases of the nervous system: Secondary | ICD-10-CM

## 2020-02-12 DIAGNOSIS — T8383XA Hemorrhage of genitourinary prosthetic devices, implants and grafts, initial encounter: Secondary | ICD-10-CM | POA: Diagnosis not present

## 2020-02-12 DIAGNOSIS — N136 Pyonephrosis: Secondary | ICD-10-CM | POA: Diagnosis present

## 2020-02-12 DIAGNOSIS — R7303 Prediabetes: Secondary | ICD-10-CM | POA: Diagnosis present

## 2020-02-12 DIAGNOSIS — E1122 Type 2 diabetes mellitus with diabetic chronic kidney disease: Secondary | ICD-10-CM | POA: Diagnosis present

## 2020-02-12 DIAGNOSIS — C7951 Secondary malignant neoplasm of bone: Secondary | ICD-10-CM | POA: Diagnosis present

## 2020-02-12 DIAGNOSIS — Z833 Family history of diabetes mellitus: Secondary | ICD-10-CM

## 2020-02-12 DIAGNOSIS — E860 Dehydration: Secondary | ICD-10-CM | POA: Diagnosis present

## 2020-02-12 DIAGNOSIS — E872 Acidosis: Secondary | ICD-10-CM | POA: Diagnosis not present

## 2020-02-12 DIAGNOSIS — Z923 Personal history of irradiation: Secondary | ICD-10-CM

## 2020-02-12 DIAGNOSIS — D631 Anemia in chronic kidney disease: Secondary | ICD-10-CM | POA: Diagnosis present

## 2020-02-12 DIAGNOSIS — Z811 Family history of alcohol abuse and dependence: Secondary | ICD-10-CM

## 2020-02-12 DIAGNOSIS — C787 Secondary malignant neoplasm of liver and intrahepatic bile duct: Secondary | ICD-10-CM | POA: Diagnosis present

## 2020-02-12 DIAGNOSIS — I959 Hypotension, unspecified: Secondary | ICD-10-CM | POA: Diagnosis present

## 2020-02-12 DIAGNOSIS — J9601 Acute respiratory failure with hypoxia: Secondary | ICD-10-CM | POA: Diagnosis not present

## 2020-02-12 DIAGNOSIS — Z8673 Personal history of transient ischemic attack (TIA), and cerebral infarction without residual deficits: Secondary | ICD-10-CM

## 2020-02-12 DIAGNOSIS — Z87891 Personal history of nicotine dependence: Secondary | ICD-10-CM

## 2020-02-12 DIAGNOSIS — G40909 Epilepsy, unspecified, not intractable, without status epilepticus: Secondary | ICD-10-CM | POA: Diagnosis present

## 2020-02-12 LAB — BASIC METABOLIC PANEL
Anion gap: 14 (ref 5–15)
BUN: 62 mg/dL — ABNORMAL HIGH (ref 8–23)
CO2: 21 mmol/L — ABNORMAL LOW (ref 22–32)
Calcium: 9.9 mg/dL (ref 8.9–10.3)
Chloride: 102 mmol/L (ref 98–111)
Creatinine, Ser: 2.19 mg/dL — ABNORMAL HIGH (ref 0.61–1.24)
GFR, Estimated: 29 mL/min — ABNORMAL LOW (ref >60)
Glucose, Bld: 163 mg/dL — ABNORMAL HIGH (ref 70–99)
Potassium: 4.5 mmol/L (ref 3.5–5.1)
Sodium: 137 mmol/L (ref 135–145)

## 2020-02-12 LAB — CBC
HCT: 35.8 % — ABNORMAL LOW (ref 39.0–52.0)
Hemoglobin: 12 g/dL — ABNORMAL LOW (ref 13.0–17.0)
MCH: 29.9 pg (ref 26.0–34.0)
MCHC: 33.5 g/dL (ref 30.0–36.0)
MCV: 89.3 fL (ref 80.0–100.0)
Platelets: 287 10*3/uL (ref 150–400)
RBC: 4.01 MIL/uL — ABNORMAL LOW (ref 4.22–5.81)
RDW: 13.2 % (ref 11.5–15.5)
WBC: 10.1 10*3/uL (ref 4.0–10.5)
nRBC: 0 % (ref 0.0–0.2)

## 2020-02-12 LAB — TYPE AND SCREEN
ABO/RH(D): A NEG
Antibody Screen: NEGATIVE

## 2020-02-12 MED ORDER — SODIUM CHLORIDE 0.9 % IV BOLUS
1000.0000 mL | Freq: Once | INTRAVENOUS | Status: AC
  Administered 2020-02-12: 1000 mL via INTRAVENOUS

## 2020-02-12 NOTE — ED Triage Notes (Signed)
FIRST NURSE NOTE: Pt arrived via [facility] from home with reports of blood in foley and pain, pt states pain worse over the past 2 days,   Pt does not have date when foley comes out at this time.  BP-110/42

## 2020-02-12 NOTE — ED Triage Notes (Signed)
Please see first nurse note. Stent and foley placed due to prostate cancer. Pink/red color urine noted in tubing to foley bag. Pt report urine leaking out around catheter currently in place. Pt hypotensive in triage. And reports feeling like he is going to pass out when he stands up.  BP 85/56

## 2020-02-12 NOTE — ED Notes (Signed)
Pt discussed with Dr. Archie Balboa, new orders received to add type and screen and NaCL bolus

## 2020-02-13 ENCOUNTER — Inpatient Hospital Stay
Admission: EM | Admit: 2020-02-13 | Discharge: 2020-02-24 | DRG: 698 | Disposition: A | Discharge status: Hospice | Payer: Medicare HMO | Attending: Internal Medicine | Admitting: Internal Medicine

## 2020-02-13 ENCOUNTER — Inpatient Hospital Stay: Payer: Medicare HMO

## 2020-02-13 ENCOUNTER — Other Ambulatory Visit: Payer: Self-pay

## 2020-02-13 ENCOUNTER — Encounter: Payer: Self-pay | Admitting: Internal Medicine

## 2020-02-13 ENCOUNTER — Inpatient Hospital Stay: Payer: Medicare HMO | Admitting: Oncology

## 2020-02-13 ENCOUNTER — Emergency Department: Payer: Medicare HMO

## 2020-02-13 DIAGNOSIS — I5032 Chronic diastolic (congestive) heart failure: Secondary | ICD-10-CM | POA: Diagnosis present

## 2020-02-13 DIAGNOSIS — J9601 Acute respiratory failure with hypoxia: Secondary | ICD-10-CM | POA: Diagnosis not present

## 2020-02-13 DIAGNOSIS — C7951 Secondary malignant neoplasm of bone: Secondary | ICD-10-CM | POA: Diagnosis present

## 2020-02-13 DIAGNOSIS — I959 Hypotension, unspecified: Secondary | ICD-10-CM | POA: Diagnosis not present

## 2020-02-13 DIAGNOSIS — Z7984 Long term (current) use of oral hypoglycemic drugs: Secondary | ICD-10-CM | POA: Diagnosis not present

## 2020-02-13 DIAGNOSIS — I4891 Unspecified atrial fibrillation: Secondary | ICD-10-CM | POA: Diagnosis not present

## 2020-02-13 DIAGNOSIS — I10 Essential (primary) hypertension: Secondary | ICD-10-CM | POA: Diagnosis not present

## 2020-02-13 DIAGNOSIS — E872 Acidosis: Secondary | ICD-10-CM | POA: Diagnosis not present

## 2020-02-13 DIAGNOSIS — R1084 Generalized abdominal pain: Secondary | ICD-10-CM | POA: Diagnosis not present

## 2020-02-13 DIAGNOSIS — I2699 Other pulmonary embolism without acute cor pulmonale: Secondary | ICD-10-CM

## 2020-02-13 DIAGNOSIS — Z8249 Family history of ischemic heart disease and other diseases of the circulatory system: Secondary | ICD-10-CM | POA: Diagnosis not present

## 2020-02-13 DIAGNOSIS — Z515 Encounter for palliative care: Secondary | ICD-10-CM

## 2020-02-13 DIAGNOSIS — Z66 Do not resuscitate: Secondary | ICD-10-CM | POA: Diagnosis not present

## 2020-02-13 DIAGNOSIS — I639 Cerebral infarction, unspecified: Secondary | ICD-10-CM | POA: Diagnosis not present

## 2020-02-13 DIAGNOSIS — Z7189 Other specified counseling: Secondary | ICD-10-CM | POA: Diagnosis not present

## 2020-02-13 DIAGNOSIS — R109 Unspecified abdominal pain: Secondary | ICD-10-CM

## 2020-02-13 DIAGNOSIS — C61 Malignant neoplasm of prostate: Secondary | ICD-10-CM | POA: Diagnosis not present

## 2020-02-13 DIAGNOSIS — R319 Hematuria, unspecified: Secondary | ICD-10-CM | POA: Diagnosis present

## 2020-02-13 DIAGNOSIS — Y846 Urinary catheterization as the cause of abnormal reaction of the patient, or of later complication, without mention of misadventure at the time of the procedure: Secondary | ICD-10-CM | POA: Diagnosis present

## 2020-02-13 DIAGNOSIS — C7800 Secondary malignant neoplasm of unspecified lung: Secondary | ICD-10-CM | POA: Diagnosis present

## 2020-02-13 DIAGNOSIS — R160 Hepatomegaly, not elsewhere classified: Secondary | ICD-10-CM | POA: Diagnosis present

## 2020-02-13 DIAGNOSIS — N390 Urinary tract infection, site not specified: Secondary | ICD-10-CM | POA: Diagnosis not present

## 2020-02-13 DIAGNOSIS — Z20822 Contact with and (suspected) exposure to covid-19: Secondary | ICD-10-CM | POA: Diagnosis present

## 2020-02-13 DIAGNOSIS — N179 Acute kidney failure, unspecified: Secondary | ICD-10-CM

## 2020-02-13 DIAGNOSIS — T83511S Infection and inflammatory reaction due to indwelling urethral catheter, sequela: Secondary | ICD-10-CM | POA: Diagnosis not present

## 2020-02-13 DIAGNOSIS — R338 Other retention of urine: Secondary | ICD-10-CM | POA: Diagnosis not present

## 2020-02-13 DIAGNOSIS — R310 Gross hematuria: Secondary | ICD-10-CM | POA: Diagnosis not present

## 2020-02-13 DIAGNOSIS — Z8673 Personal history of transient ischemic attack (TIA), and cerebral infarction without residual deficits: Secondary | ICD-10-CM | POA: Diagnosis not present

## 2020-02-13 DIAGNOSIS — Z833 Family history of diabetes mellitus: Secondary | ICD-10-CM | POA: Diagnosis not present

## 2020-02-13 DIAGNOSIS — N1831 Chronic kidney disease, stage 3a: Secondary | ICD-10-CM | POA: Diagnosis present

## 2020-02-13 DIAGNOSIS — N139 Obstructive and reflux uropathy, unspecified: Secondary | ICD-10-CM | POA: Diagnosis not present

## 2020-02-13 DIAGNOSIS — R7303 Prediabetes: Secondary | ICD-10-CM | POA: Diagnosis present

## 2020-02-13 DIAGNOSIS — C787 Secondary malignant neoplasm of liver and intrahepatic bile duct: Secondary | ICD-10-CM | POA: Diagnosis present

## 2020-02-13 DIAGNOSIS — R569 Unspecified convulsions: Secondary | ICD-10-CM

## 2020-02-13 DIAGNOSIS — H9190 Unspecified hearing loss, unspecified ear: Secondary | ICD-10-CM | POA: Diagnosis present

## 2020-02-13 DIAGNOSIS — N136 Pyonephrosis: Secondary | ICD-10-CM | POA: Diagnosis present

## 2020-02-13 DIAGNOSIS — T83511A Infection and inflammatory reaction due to indwelling urethral catheter, initial encounter: Secondary | ICD-10-CM | POA: Diagnosis present

## 2020-02-13 DIAGNOSIS — Z800 Family history of malignant neoplasm of digestive organs: Secondary | ICD-10-CM | POA: Diagnosis not present

## 2020-02-13 DIAGNOSIS — D500 Iron deficiency anemia secondary to blood loss (chronic): Secondary | ICD-10-CM | POA: Diagnosis not present

## 2020-02-13 DIAGNOSIS — E1129 Type 2 diabetes mellitus with other diabetic kidney complication: Secondary | ICD-10-CM | POA: Diagnosis present

## 2020-02-13 DIAGNOSIS — N1330 Unspecified hydronephrosis: Secondary | ICD-10-CM | POA: Diagnosis not present

## 2020-02-13 DIAGNOSIS — Z79899 Other long term (current) drug therapy: Secondary | ICD-10-CM | POA: Diagnosis not present

## 2020-02-13 DIAGNOSIS — R0600 Dyspnea, unspecified: Secondary | ICD-10-CM

## 2020-02-13 DIAGNOSIS — I130 Hypertensive heart and chronic kidney disease with heart failure and stage 1 through stage 4 chronic kidney disease, or unspecified chronic kidney disease: Secondary | ICD-10-CM | POA: Diagnosis present

## 2020-02-13 DIAGNOSIS — Z923 Personal history of irradiation: Secondary | ICD-10-CM | POA: Diagnosis not present

## 2020-02-13 LAB — URINALYSIS, COMPLETE (UACMP) WITH MICROSCOPIC
Bilirubin Urine: NEGATIVE
Glucose, UA: NEGATIVE mg/dL
Ketones, ur: NEGATIVE mg/dL
Nitrite: NEGATIVE
Protein, ur: 30 mg/dL — AB
RBC / HPF: >50 RBC/hpf — ABNORMAL HIGH (ref 0–5)
Specific Gravity, Urine: 1.015 (ref 1.005–1.030)
Squamous Epithelial / HPF: NONE SEEN (ref 0–5)
pH: 5 (ref 5.0–8.0)

## 2020-02-13 LAB — BASIC METABOLIC PANEL
Anion gap: 16 — ABNORMAL HIGH (ref 5–15)
Anion gap: 17 — ABNORMAL HIGH (ref 5–15)
BUN: 56 mg/dL — ABNORMAL HIGH (ref 8–23)
BUN: 49 mg/dL — ABNORMAL HIGH (ref 8–23)
CO2: 15 mmol/L — ABNORMAL LOW (ref 22–32)
CO2: 21 mmol/L — ABNORMAL LOW (ref 22–32)
Calcium: 9.7 mg/dL (ref 8.9–10.3)
Calcium: 9.7 mg/dL (ref 8.9–10.3)
Chloride: 104 mmol/L (ref 98–111)
Chloride: 101 mmol/L (ref 98–111)
Creatinine, Ser: 1.85 mg/dL — ABNORMAL HIGH (ref 0.61–1.24)
Creatinine, Ser: 1.7 mg/dL — ABNORMAL HIGH (ref 0.61–1.24)
GFR, Estimated: 35 mL/min — ABNORMAL LOW (ref >60)
GFR, Estimated: 39 mL/min — ABNORMAL LOW (ref >60)
Glucose, Bld: 143 mg/dL — ABNORMAL HIGH (ref 70–99)
Glucose, Bld: 141 mg/dL — ABNORMAL HIGH (ref 70–99)
Potassium: 5.4 mmol/L — ABNORMAL HIGH (ref 3.5–5.1)
Potassium: 3.9 mmol/L (ref 3.5–5.1)
Sodium: 135 mmol/L (ref 135–145)
Sodium: 139 mmol/L (ref 135–145)

## 2020-02-13 LAB — PROTIME-INR
INR: 1.2 (ref 0.8–1.2)
Prothrombin Time: 15 seconds (ref 11.4–15.2)

## 2020-02-13 LAB — CBC
HCT: 37.9 % — ABNORMAL LOW (ref 39.0–52.0)
Hemoglobin: 12.3 g/dL — ABNORMAL LOW (ref 13.0–17.0)
MCH: 30 pg (ref 26.0–34.0)
MCHC: 32.5 g/dL (ref 30.0–36.0)
MCV: 92.4 fL (ref 80.0–100.0)
Platelets: 226 10*3/uL (ref 150–400)
RBC: 4.1 MIL/uL — ABNORMAL LOW (ref 4.22–5.81)
RDW: 13.9 % (ref 11.5–15.5)
WBC: 9.7 10*3/uL (ref 4.0–10.5)
nRBC: 0 % (ref 0.0–0.2)

## 2020-02-13 LAB — FIBRIN DERIVATIVES D-DIMER (ARMC ONLY): Fibrin derivatives D-dimer (ARMC): 4052.75 ng/mL (FEU) — ABNORMAL HIGH (ref 0.00–499.00)

## 2020-02-13 LAB — GLUCOSE, CAPILLARY: Glucose-Capillary: 145 mg/dL — ABNORMAL HIGH (ref 70–99)

## 2020-02-13 LAB — RESP PANEL BY RT PCR (RSV, FLU A&B, COVID)
Influenza A by PCR: NEGATIVE
Influenza B by PCR: NEGATIVE
Respiratory Syncytial Virus by PCR: NEGATIVE
SARS Coronavirus 2 by RT PCR: NEGATIVE

## 2020-02-13 LAB — LACTIC ACID, PLASMA
Lactic Acid, Venous: 3.4 mmol/L (ref 0.5–1.9)
Lactic Acid, Venous: 2 mmol/L (ref 0.5–1.9)

## 2020-02-13 LAB — APTT: aPTT: 38 seconds — ABNORMAL HIGH (ref 24–36)

## 2020-02-13 LAB — PROCALCITONIN: Procalcitonin: 0.42 ng/mL

## 2020-02-13 LAB — TROPONIN I (HIGH SENSITIVITY): Troponin I (High Sensitivity): 23 ng/L — ABNORMAL HIGH (ref <18)

## 2020-02-13 LAB — BRAIN NATRIURETIC PEPTIDE: B Natriuretic Peptide: 94.4 pg/mL (ref 0.0–100.0)

## 2020-02-13 MED ORDER — SODIUM CHLORIDE 0.9 % IV SOLN
2.0000 g | Freq: Once | INTRAVENOUS | Status: DC
  Filled 2020-02-13: qty 2

## 2020-02-13 MED ORDER — METOPROLOL TARTRATE 5 MG/5ML IV SOLN: 2.5000 mg | Freq: Once | INTRAVENOUS | Status: AC

## 2020-02-13 MED ORDER — INSULIN ASPART 100 UNIT/ML ~~LOC~~ SOLN
0.0000 [IU] | Freq: Three times a day (TID) | SUBCUTANEOUS | Status: DC
  Administered 2020-02-13 – 2020-02-15 (×5): 1 [IU] via SUBCUTANEOUS
  Filled 2020-02-13 (×5): qty 1

## 2020-02-13 MED ORDER — ONDANSETRON HCL 4 MG/2ML IJ SOLN: 4.0000 mg | Freq: Three times a day (TID) | INTRAMUSCULAR | Status: DC | PRN

## 2020-02-13 MED ORDER — SODIUM CHLORIDE 0.9 % IV SOLN
2.0000 g | INTRAVENOUS | Status: DC
  Administered 2020-02-13 – 2020-02-17 (×5): 2 g via INTRAVENOUS
  Filled 2020-02-13 (×2): qty 20
  Filled 2020-02-13: qty 2
  Filled 2020-02-13 (×2): qty 20

## 2020-02-13 MED ORDER — INSULIN ASPART 100 UNIT/ML ~~LOC~~ SOLN
0.0000 [IU] | Freq: Every day | SUBCUTANEOUS | Status: DC
  Filled 2020-02-13: qty 1

## 2020-02-13 MED ORDER — ROSUVASTATIN CALCIUM 5 MG PO TABS
5.0000 mg | ORAL_TABLET | Freq: Every day | ORAL | Status: DC
  Administered 2020-02-14 – 2020-02-20 (×8): 5 mg via ORAL
  Filled 2020-02-13 (×9): qty 1

## 2020-02-13 MED ORDER — ACETAMINOPHEN 325 MG PO TABS
650.0000 mg | ORAL_TABLET | Freq: Four times a day (QID) | ORAL | Status: DC | PRN
  Administered 2020-02-16 – 2020-02-21 (×5): 650 mg via ORAL
  Filled 2020-02-13 (×6): qty 2

## 2020-02-13 MED ORDER — METOPROLOL TARTRATE 5 MG/5ML IV SOLN
INTRAVENOUS | Status: AC
  Administered 2020-02-13: 2.5 mg via INTRAVENOUS
  Filled 2020-02-13: qty 5

## 2020-02-13 MED ORDER — LORAZEPAM 2 MG/ML IJ SOLN
0.5000 mg | Freq: Two times a day (BID) | INTRAMUSCULAR | Status: DC | PRN
  Administered 2020-02-13 – 2020-02-22 (×3): 0.5 mg via INTRAVENOUS
  Filled 2020-02-13 (×3): qty 1

## 2020-02-13 MED ORDER — LORAZEPAM 2 MG/ML IJ SOLN: 0.5000 mg | INTRAMUSCULAR | Status: DC | PRN

## 2020-02-13 MED ORDER — IOHEXOL 350 MG/ML SOLN
60.0000 mL | Freq: Once | INTRAVENOUS | Status: AC | PRN
  Administered 2020-02-13: 60 mL via INTRAVENOUS

## 2020-02-13 MED ORDER — ALPRAZOLAM 0.5 MG PO TABS: 0.2500 mg | ORAL_TABLET | Freq: Two times a day (BID) | ORAL | Status: DC | PRN

## 2020-02-13 MED ORDER — HYDRALAZINE HCL 20 MG/ML IJ SOLN
5.0000 mg | INTRAMUSCULAR | Status: DC | PRN
  Filled 2020-02-13: qty 1

## 2020-02-13 MED ORDER — OXYCODONE-ACETAMINOPHEN 5-325 MG PO TABS
1.0000 | ORAL_TABLET | Freq: Four times a day (QID) | ORAL | Status: DC | PRN
  Administered 2020-02-13 – 2020-02-14 (×2): 1 via ORAL
  Filled 2020-02-13 (×2): qty 1

## 2020-02-13 MED ORDER — METOPROLOL TARTRATE 50 MG PO TABS
50.0000 mg | ORAL_TABLET | Freq: Two times a day (BID) | ORAL | Status: DC
  Administered 2020-02-13 – 2020-02-24 (×22): 50 mg via ORAL
  Filled 2020-02-13 (×24): qty 1

## 2020-02-13 MED ORDER — LACTATED RINGERS IV BOLUS
1000.0000 mL | Freq: Once | INTRAVENOUS | Status: AC
  Administered 2020-02-13: 1000 mL via INTRAVENOUS

## 2020-02-13 MED ORDER — LEVETIRACETAM 500 MG PO TABS
500.0000 mg | ORAL_TABLET | Freq: Two times a day (BID) | ORAL | Status: DC
  Administered 2020-02-13 – 2020-02-24 (×23): 500 mg via ORAL
  Filled 2020-02-13 (×24): qty 1

## 2020-02-13 MED ORDER — METOPROLOL TARTRATE 50 MG PO TABS: 100.0000 mg | ORAL_TABLET | Freq: Two times a day (BID) | ORAL | Status: DC

## 2020-02-13 MED ORDER — DILTIAZEM HCL-DEXTROSE 125-5 MG/125ML-% IV SOLN (PREMIX)
5.0000 mg/h | INTRAVENOUS | Status: DC
  Administered 2020-02-13: 5 mg/h via INTRAVENOUS
  Filled 2020-02-13 (×2): qty 125

## 2020-02-13 MED ORDER — SODIUM CHLORIDE 0.9 % IV SOLN: INTRAVENOUS | Status: DC

## 2020-02-13 NOTE — ED Notes (Signed)
Attempted to call report, per floor they are in a rapid response and will take report in a few minutes

## 2020-02-13 NOTE — ED Notes (Signed)
Lab contacted to come collect blood cultures and remaining labs, this RN attempt x2

## 2020-02-13 NOTE — ED Notes (Signed)
MD messaged regarding pt HR

## 2020-02-13 NOTE — Progress Notes (Addendum)
OT Cancellation Note  Patient Details Name: Timothy Maddox MRN: [id number] DOB: December 24, 1945   Cancelled Treatment:    Reason Eval/Treat Not Completed: Medical issues which prohibited therapy. Consult received, chart reviewed. Pt noted with elevated HR this afternoon, 140's - 160's. Per notes, RN notified MD. K+ also 5.4, falling outside guidelines for participation in therapy. Will hold OT evaluation at this time. Will re-attempt at later date/time as medically appropriate.   Jeni Salles, MPH, MS, OTR/L ascom [phone] 02/13/20, 3:02 PM

## 2020-02-13 NOTE — ED Notes (Signed)
Md messaged regarding pt HR. Orders received

## 2020-02-13 NOTE — ED Provider Notes (Signed)
-----------------------------------------   11:43 PM on 02/13/2020 -----------------------------------------  Because the patient is still in the emergency department, I received a phone call from radiology to bring my attention to the fact that this patient had a CTA chest which was positive for acute pulmonary embolism without right heart strain.  I contacted Sharion Settler with the hospitalist service who acknowledged the results and will act on them appropriately.   Hinda Kehr, MD 02/13/20 [phone]

## 2020-02-13 NOTE — Consult Note (Signed)
Urology Consult  I have been asked to see the patient by Dr. Blaine Hamper, for evaluation and management of gross hematuria and complicated UTI.  Chief Complaint: Bladder pain  History of Present Illness: Timothy Maddox is a 74 y.o. year old male with metastatic castrate resistant prostate cancer now POD 6 from cystoscopy with urethral dilation with Dr. [name] who presented to the ED yesterday with reports of gross hematuria, weakness, and intermittent bladder/penile pain.  Admission labs notable for WBC count 10.1; creatinine 2.19 (baseline 1.2-1.4); UA with >50 RBCs/hpf, 11-20 WBCs/hpf, no nitrites, rare bacteria; and lactate 3.4. Urine and blood cultures pending, on antibiotics as below.  CT stone study with pulmonary, hepatic, and osseous metastases; moderate bilateral hydroureteronephrosis to the level of the bilateral UVJs; and multiple layering bladder stones around the Foley catheter tip.  Patient reports bothersome sensations of urinary urgency that are intermittent in nature and associated with urinary leakage around his Foley catheter tubing.  He is concerned that these findings indicate that his Foley catheter is not draining appropriately.  Foley catheter in place draining tea colored urine today.  Anti-infectives (From admission, onward)   Start     Dose/Rate Route Frequency Ordered Stop   02/13/20 1000  ceFEPIme (MAXIPIME) 2 g in sodium chloride 0.9 % 100 mL IVPB  Status:  Discontinued        2 g 200 mL/hr over 30 Minutes Intravenous  Once 02/13/20 0942 02/13/20 0953   02/13/20 1000  cefTRIAXone (ROCEPHIN) 2 g in sodium chloride 0.9 % 100 mL IVPB        2 g 200 mL/hr over 30 Minutes Intravenous Every 24 hours 02/13/20 0954         Past Medical History:  Diagnosis Date  . Anxiety   . Arthritis    lower back, shoulders  . AVM (arteriovenous malformation) brain   . Cancer [facility])    prostate- treated with radiation  . CVA (cerebral infarction) 11/2015  . Depression   .  Diabetes mellitus without complication ([place])   . Dyspnea    with exertion  . HOH (hard of hearing)   . Hyperlipidemia   . Hypertension   . Pneumonia    1st grade  . Pre-diabetes   . Prediabetes   . Seizures ([place]) 12/2015  . Sleep apnea   . Stroke [facility])    speech impairment, no weakness    Past Surgical History:  Procedure Laterality Date  . BACK SURGERY  1999   disectomy  . BACK SURGERY     2 surgery- injury  . COLONOSCOPY W/ POLYPECTOMY    . COLONOSCOPY WITH PROPOFOL N/A 02/28/2019   Procedure: COLONOSCOPY WITH PROPOFOL;  Surgeon: Toledo, Benay Pike, MD;  Location: [facility] ENDOSCOPY;  Service: Gastroenterology;  Laterality: N/A;  . CYSTOSCOPY WITH URETHRAL DILATATION Bilateral 02/07/2020   Procedure: CYSTOSCOPY WITH URETHRAL DILATATION;  Surgeon: Abbie Sons, MD;  Location: [facility] ORS;  Service: Urology;  Laterality: Bilateral;  . HAMMER TOE SURGERY Bilateral   . INGUINAL HERNIA REPAIR Right    per patient when he was in 1st grade  . IR GENERIC HISTORICAL  12/05/2015   IR RADIOLOGIST EVAL & MGMT 12/05/2015 [facility]  . IR GENERIC HISTORICAL  01/31/2016   IR RADIOLOGIST EVAL & MGMT 01/31/2016 [facility]  . IR GENERIC HISTORICAL  02/20/2016   IR ANGIO INTRA EXTRACRAN SEL COM CAROTID INNOMINATE UNI L MOD SED 02/20/2016 [name], MD [facility]  . IR GENERIC HISTORICAL  02/20/2016   IR 3D INDEPENDENT WKST 02/20/2016 [name], MD [facility]  . KNEE SURGERY Bilateral 2015  . RADIOLOGY WITH ANESTHESIA N/A 02/20/2016   Procedure: EMBOLIZATION;  Surgeon: [name], MD;  Location: [place];  Service: Radiology;  Laterality: N/A;  . TOE SURGERY Left 2017   TOE NAIL REMOVAL  . TOTAL KNEE ARTHROPLASTY Right 07/20/2017   Procedure: TOTAL KNEE ARTHROPLASTY;  Surgeon: Lovell Sheehan, MD;  Location: [facility] ORS;  Service: Orthopedics;  Laterality: Right;    Home Medications:  Current Meds  Medication Sig  . amLODipine (NORVASC) 10 MG tablet Take 10 mg by  mouth daily.  Marland Kitchen levETIRAcetam (ROWEEPRA) 500 MG tablet Take 500 mg by mouth 2 (two) times daily.  Marland Kitchen losartan-hydrochlorothiazide (HYZAAR) 100-25 MG tablet Take 1 tablet by mouth daily.  . metFORMIN (GLUCOPHAGE-XR) 500 MG 24 hr tablet Take 500 mg by mouth 2 (two) times daily.  . metoprolol tartrate (LOPRESSOR) 100 MG tablet Take 100 mg by mouth 2 (two) times daily.  . NUBEQA 300 MG tablet TAKE 2 TABLETS (600MG ) BY MOUTH TWO TIMES DAILY WITH A MEAL. (Patient taking differently: Take 600 mg by mouth in the morning and at bedtime. )  . oxyCODONE-acetaminophen (PERCOCET/ROXICET) 5-325 MG tablet Take 1 tablet by mouth every 8 (eight) hours as needed for severe pain.  . rosuvastatin (CRESTOR) 5 MG tablet Take 5 mg by mouth at bedtime.   Marland Kitchen spironolactone (ALDACTONE) 100 MG tablet Take 100 mg by mouth 2 (two) times daily.    Allergies:  Allergies  Allergen Reactions  . Poison Oak Extract Rash  . Poison Ivy Extract Rash    Family History  Problem Relation Age of Onset  . Heart failure Father   . Diabetes Father   . Hypertension Father   . Alzheimer's disease Father 15  . Other Mother 47       homicide  . Alcohol abuse Mother   . Pancreatic cancer Sister   . Alcohol abuse Sister   . Diabetes Brother   . Diabetes Paternal Aunt   . Diabetes Paternal Grandmother   . Diabetes Paternal Grandfather     Social History:  reports that he quit smoking about 53 years ago. His smoking use included cigarettes. He has a 2.00 pack-year smoking history. He has never used smokeless tobacco. He reports previous alcohol use. He reports that he does not use drugs.  ROS: A complete review of systems was performed.  All systems are negative except for pertinent findings as noted.  Physical Exam:  Vital signs in last 24 hours: Temp:  [98.5 F (36.9 C)-98.9 F (37.2 C)] 98.6 F (37 C) (10/11 0227) Pulse Rate:  [67-92] 85 (10/11 1030) Resp:  [16-20] 20 (10/11 1030) BP: (85-155)/(56-84) 155/84 (10/11  1030) SpO2:  [94 %-96 %] 95 % (10/11 1030) Weight:  [122.5 kg] 122.5 kg (10/10 1732) Constitutional:  Alert and oriented, no acute distress HEENT: [place] AT, moist mucus membranes Cardiovascular: No clubbing, cyanosis, or edema Respiratory: Normal respiratory effort Skin: No rashes, bruises or suspicious lesions Neurologic: Grossly intact, no focal deficits, moving all 4 extremities Psychiatric: Normal mood and affect  Laboratory Data:  Recent Labs    02/12/20 1758 02/13/20 0817  WBC 10.1 9.7  HGB 12.0* 12.3*  HCT 35.8* 37.9*   Recent Labs    02/12/20 1758 02/13/20 0817  NA 137 135  K 4.5 5.4*  CL 102 104  CO2 21* 15*  GLUCOSE 163* 143*  BUN 62* 56*  CREATININE 2.19* 1.85*  CALCIUM 9.9 9.7   Recent Labs    02/13/20 1022  INR 1.2   No results for input(s): LABURIN in the last 72 hours. Urinalysis    Component Value Date/Time   COLORURINE YELLOW (A) 02/13/2020 0846   APPEARANCEUR CLOUDY (A) 02/13/2020 0846   APPEARANCEUR Cloudy (A) 01/19/2020 1435   LABSPEC 1.015 02/13/2020 0846   PHURINE 5.0 02/13/2020 0846   GLUCOSEU NEGATIVE 02/13/2020 0846   HGBUR LARGE (A) 02/13/2020 0846   BILIRUBINUR NEGATIVE 02/13/2020 0846   BILIRUBINUR Negative 01/19/2020 [place] 02/13/2020 0846   PROTEINUR 30 (A) 02/13/2020 0846   NITRITE NEGATIVE 02/13/2020 0846   LEUKOCYTESUR TRACE (A) 02/13/2020 0846   Results for orders placed or performed during the hospital encounter of 02/13/20  Resp Panel by RT PCR (RSV, Flu A&B, Covid) - Nasopharyngeal Swab     Status: None   Collection Time: 02/13/20  9:59 AM   Specimen: Nasopharyngeal Swab  Result Value Ref Range Status   SARS Coronavirus 2 by RT PCR NEGATIVE NEGATIVE Final    Comment: (NOTE) SARS-CoV-2 target nucleic acids are NOT DETECTED.  The SARS-CoV-2 RNA is generally detectable in upper respiratoy specimens during the acute phase of infection. The lowest concentration of SARS-CoV-2 viral copies this assay can  detect is 131 copies/mL. A negative result does not preclude SARS-Cov-2 infection and should not be used as the sole basis for treatment or other patient management decisions. A negative result may occur with  improper specimen collection/handling, submission of specimen other than nasopharyngeal swab, presence of viral mutation(s) within the areas targeted by this assay, and inadequate number of viral copies (<131 copies/mL). A negative result must be combined with clinical observations, patient history, and epidemiological information. The expected result is Negative.  Fact Sheet for Patients:  PinkCheek.be  Fact Sheet for Healthcare Providers:  GravelBags.it  This test is no t yet approved or cleared by the [place] FDA and  has been authorized for detection and/or diagnosis of SARS-CoV-2 by FDA under an Emergency Use Authorization (EUA). This EUA will remain  in effect (meaning this test can be used) for the duration of the COVID-19 declaration under Section 564(b)(1) of the Act, 21 U.S.C. section 360bbb-3(b)(1), unless the authorization is terminated or revoked sooner.     Influenza A by PCR NEGATIVE NEGATIVE Final   Influenza B by PCR NEGATIVE NEGATIVE Final    Comment: (NOTE) The Xpert Xpress SARS-CoV-2/FLU/RSV assay is intended as an aid in  the diagnosis of influenza from Nasopharyngeal swab specimens and  should not be used as a sole basis for treatment. Nasal washings and  aspirates are unacceptable for Xpert Xpress SARS-CoV-2/FLU/RSV  testing.  Fact Sheet for Patients: PinkCheek.be  Fact Sheet for Healthcare Providers: GravelBags.it  This test is not yet approved or cleared by the [place] FDA and  has been authorized for detection and/or diagnosis of SARS-CoV-2 by  FDA under an Emergency Use Authorization (EUA). This EUA will remain  in  effect (meaning this test can be used) for the duration of the  Covid-19 declaration under Section 564(b)(1) of the Act, 21  U.S.C. section 360bbb-3(b)(1), unless the authorization is  terminated or revoked.    Respiratory Syncytial Virus by PCR NEGATIVE NEGATIVE Final    Comment: (NOTE) Fact Sheet for Patients: PinkCheek.be  Fact Sheet for Healthcare Providers: GravelBags.it  This test is not yet approved or cleared by the [place] and  has been authorized  for detection and/or diagnosis of SARS-CoV-2 by  FDA under an Emergency Use Authorization (EUA). This EUA will remain  in effect (meaning this test can be used) for the duration of the  COVID-19 declaration under Section 564(b)(1) of the Act, 21 U.S.C.  section 360bbb-3(b)(1), unless the authorization is terminated or  revoked. Performed at [facility], [address], [place], [place] [address]     Radiologic Imaging: CT Renal Stone Study  Result Date: 02/13/2020 CLINICAL DATA:  Prostate cancer. Pink colored urine. Hypotension. Flank pain. EXAM: CT ABDOMEN AND PELVIS WITHOUT CONTRAST TECHNIQUE: Multidetector CT imaging of the abdomen and pelvis was performed following the standard protocol without IV contrast. COMPARISON:  12/23/2017 CT abdomen/pelvis. FINDINGS: Lower chest: Numerous (greater than 20) solid pulmonary nodules and masses at both lung bases, largest 5.2 cm in the basilar right lower lobe (series 4/image 27) and 5.5 cm in the basilar left lower lobe (series 4/image 27), all new. Atherosclerosis. Hepatobiliary: Multiple (at least 5) hypodense liver masses scattered throughout the liver, largest 3.8 cm in the segment 6 right liver lobe (series 2/image 36) and 2.0 cm in the superior right liver (series 2/image 22), all new. Cholelithiasis. No gallbladder wall thickening or pericholecystic fluid. No biliary ductal dilatation. Pancreas: Normal, with  no mass or duct dilation. Spleen: Normal size. No mass. Adrenals/Urinary Tract: Normal right adrenal. Left adrenal 1.5 cm nodule with density 17 HU, stable, most compatible with an adenoma. Moderate bilateral hydroureteronephrosis to the level of the ureterovesical junctions bilaterally. No renal stones. No ureteral stones. Simple 3.7 cm posterior lower left renal cyst. No additional contour deforming renal lesions. Multiple layering stones in distended bladder, largest 19 mm. Chronic mild diffuse bladder wall thickening is unchanged. Foley catheter in place with balloon in the dependent bladder. Stomach/Bowel: Normal non-distended stomach. Normal caliber small bowel with no small bowel wall thickening. Normal appendix. Normal large bowel with no diverticulosis, large bowel wall thickening or pericolonic fat stranding. Vascular/Lymphatic: Atherosclerotic nonaneurysmal abdominal aorta. No pathologically enlarged lymph nodes in the abdomen or pelvis. Reproductive: Newly mildly enlarged, irregular and poorly marginated prostate, directly contiguous with the bladder trigone. Fiducial markers noted in the prostate as before. Other: No pneumoperitoneum, ascites or focal fluid collection. Solid subcutaneous 2.9 cm lateral right chest wall mass (series 2/image 29), new. Similar solid subcutaneous 4.4 cm left gluteal mass (series 2/image 63). Small fat containing umbilical hernia is stable. Musculoskeletal: Numerous new sclerotic osseous lesions throughout the visualized skeleton including multiple lower ribs bilaterally, multiple thoracolumbar vertebrae largest at T11 and throughout the bilateral pelvic girdle, most prominent in the left iliac crest (series 2/image 60). Marked lumbar spondylosis. IMPRESSION: 1. Numerous new solid pulmonary nodules and masses at both lung bases, largest 5.2 cm in the basilar right lower lobe, compatible with pulmonary metastases. 2. New widespread sclerotic osseous metastases throughout the  visualized skeleton as detailed. 3. Multiple new hypodense liver masses compatible with liver metastases. 4. Newly mildly enlarged, irregular and poorly marginated prostate, directly contiguous with the bladder trigone, suggestive of recurrent locally advanced prostate cancer. 5. Moderate bilateral hydroureteronephrosis to the level of the UVJ bilaterally, probably due to bladder outlet obstruction despite the well-positioned Foley catheter in the bladder. 6. Multiple layering bladder stones in the distended bladder. 7. Cholelithiasis. 8. Stable left adrenal adenoma. 9. Aortic Atherosclerosis (ICD10-I70.0). Electronically Signed   By: Ilona Sorrel M.D.   On: 02/13/2020 10:06   Assessment & Plan:  74 year old male with metastatic prostate cancer admitted for gross hematuria  and complicated UTI.  His primary complaint is intermittent sensations of urinary urgency despite Foley catheter that are associated with urinary leakage around his tubing.  Foley catheter draining well on physical exam today.  Symptoms consistent with bladder spasms.  I recommend oxybutynin and B&O suppositories for management. Gross hematuria mild on exam today, normal finding in the setting of recent urethral dilation.  Recommendations: -Agree with antibiotics, follow urine cultures -Hand irrigate Foley as needed to ensure appropriate drainage -Repeat renal ultrasound in 48 hours if AKI has not resolved -Continue Foley, manage bladder spasms with oxybutynin and B&O suppositories  Thank you for involving me in this patient's care, please page with any further questions or concerns.  Debroah Loop, PA-C 02/13/2020 1:15 PM

## 2020-02-13 NOTE — ED Notes (Signed)
Lab called regarding troponin that had not been run. Per lab, they are running it now

## 2020-02-13 NOTE — ED Notes (Signed)
Pharmacy contacted for maxipime

## 2020-02-13 NOTE — ED Notes (Signed)
Per unit they have not approved this patient yet, told to call back

## 2020-02-13 NOTE — ED Notes (Signed)
Pt assisted to toilet for BM 

## 2020-02-13 NOTE — ED Provider Notes (Signed)
[facility] Emergency Department Provider Note  ____________________________________________   First MD Initiated Contact with Patient 02/13/20 [phone]     (approximate)  I have reviewed the triage vital signs and the nursing notes.   HISTORY  Chief Complaint Near Syncope and Hematuria    HPI Timothy Maddox is a 74 y.o. male  Here with near syncope, lightheadedness upon standing, blood in foley catheter. Pt has a h/o prostate CA, urethral stricture s/p recent cystoscopy and foley placement w/ Dr. [name] on 02/07/20. He is scheduled for Axumin scan this week. Reports that over the last week, he's had persistent sharp, stabbing, stinging pain around his foley as well as general fatigue, lightheadedness upon standing. Reports he has felt weak and had difficulty walking past several days. No known fevers, chills. No nausea, vomiting. No flank pain. Reports that he presents today for his weakness as well as persistent penile pain and dark, blood-tinged/brown urine.       Past Medical History:  Diagnosis Date  . Anxiety   . Arthritis    lower back, shoulders  . AVM (arteriovenous malformation) brain   . Cancer [facility])    prostate- treated with radiation  . CVA (cerebral infarction) 11/2015  . Depression   . Diabetes mellitus without complication ([place])   . Dyspnea    with exertion  . HOH (hard of hearing)   . Hyperlipidemia   . Hypertension   . Pneumonia    1st grade  . Pre-diabetes   . Prediabetes   . Seizures ([place]) 12/2015  . Sleep apnea   . Stroke [facility])    speech impairment, no weakness    Patient Active Problem List   Diagnosis Date Noted  . Stage 3a chronic kidney disease ([place]) 01/30/2020  . Androgen deprivation therapy 01/30/2020  . Encounter for antineoplastic chemotherapy 09/24/2018  . Weak urine stream 09/24/2018  . Rising PSA level 07/08/2018  . Prostate cancer ([place]) 07/08/2018  . Goals of care, counseling/discussion 12/31/2017  .  Osteoarthritis of right knee 07/20/2017  . Seizure ([place]) 12/19/2015  . 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 Seizures ([place]) 12/19/2015  . AVF (arteriovenous fistula) ([place]) 11/16/2015  . ICH (intracerebral hemorrhage) ([place]) 11/16/2015  . Stroke [facility]) 11/13/2015    Past Surgical History:  Procedure Laterality Date  . BACK SURGERY  1999   disectomy  . BACK SURGERY     2 surgery- injury  . COLONOSCOPY W/ POLYPECTOMY    . COLONOSCOPY WITH PROPOFOL N/A 02/28/2019   Procedure: COLONOSCOPY WITH PROPOFOL;  Surgeon: Toledo, Benay Pike, MD;  Location: [facility] ENDOSCOPY;  Service: Gastroenterology;  Laterality: N/A;  . CYSTOSCOPY WITH URETHRAL DILATATION Bilateral 02/07/2020   Procedure: CYSTOSCOPY WITH URETHRAL DILATATION;  Surgeon: Abbie Sons, MD;  Location: [facility] ORS;  Service: Urology;  Laterality: Bilateral;  . HAMMER TOE SURGERY Bilateral   . INGUINAL HERNIA REPAIR Right    per patient when he was in 1st grade  . IR GENERIC HISTORICAL  12/05/2015   IR RADIOLOGIST EVAL & MGMT 12/05/2015 [facility]  . IR GENERIC HISTORICAL  01/31/2016   IR RADIOLOGIST EVAL & MGMT 01/31/2016 [facility]  . IR GENERIC HISTORICAL  02/20/2016   IR ANGIO INTRA EXTRACRAN SEL COM CAROTID INNOMINATE UNI L MOD SED 02/20/2016 [name], MD [facility]  . IR GENERIC HISTORICAL  02/20/2016   IR 3D INDEPENDENT WKST 02/20/2016 [name], MD [facility]  . KNEE SURGERY Bilateral 2015  . RADIOLOGY WITH ANESTHESIA N/A  02/20/2016   Procedure: EMBOLIZATION;  Surgeon: [name], MD;  Location: [place];  Service: Radiology;  Laterality: N/A;  . TOE SURGERY Left 2017   TOE NAIL REMOVAL  . TOTAL KNEE ARTHROPLASTY Right 07/20/2017   Procedure: TOTAL KNEE ARTHROPLASTY;  Surgeon: Lovell Sheehan, MD;  Location: [facility] ORS;  Service: Orthopedics;  Laterality: Right;    Prior to Admission medications   Medication Sig Start Date End Date Taking? Authorizing Provider  ACCU-CHEK GUIDE test  strip USE 1 STRIP TO CHECK BLOOD SUGAR ONCE DAILY IN THE MORNING 07/23/18   [provider]  acetaminophen (TYLENOL) 500 MG tablet Take 500 mg by mouth every 6 (six) hours as needed (pain.).     [provider]  amLODipine (NORVASC) 10 MG tablet Take 10 mg by mouth daily.    [provider]  levETIRAcetam (ROWEEPRA) 500 MG tablet Take 500 mg by mouth 2 (two) times daily.    [provider]  losartan-hydrochlorothiazide (HYZAAR) 100-25 MG tablet Take 1 tablet by mouth daily. 05/26/17   [provider]  metFORMIN (GLUCOPHAGE) 500 MG tablet Take 500 mg by mouth 2 (two) times daily with a meal.    [provider]  metoprolol tartrate (LOPRESSOR) 100 MG tablet Take 100 mg by mouth 2 (two) times daily. 07/27/18   [provider]  NUBEQA 300 MG tablet TAKE 2 TABLETS (600MG ) BY MOUTH TWO TIMES DAILY WITH A MEAL. Patient taking differently: Take 600 mg by mouth in the morning and at bedtime.  11/22/19   Earlie Server, MD  oxyCODONE-acetaminophen (PERCOCET/ROXICET) 5-325 MG tablet Take 1 tablet by mouth every 8 (eight) hours as needed for severe pain. 02/07/20   Stoioff, Ronda Fairly, MD  rosuvastatin (CRESTOR) 5 MG tablet Take 5 mg by mouth at bedtime.  11/10/17   [provider]  spironolactone (ALDACTONE) 100 MG tablet Take 100 mg by mouth 2 (two) times daily.    [provider]  tamsulosin (FLOMAX) 0.4 MG CAPS capsule TAKE ONE CAPSULE BY MOUTH ONCE DAILY 02/08/20   Earlie Server, MD    Allergies Poison oak extract and Poison ivy extract  Family History  Problem Relation Age of Onset  . Heart failure Father   . Diabetes Father   . Hypertension Father   . Alzheimer's disease Father 20  . Other Mother 56       homicide  . Alcohol abuse Mother   . Pancreatic cancer Sister   . Alcohol abuse Sister   . Diabetes Brother   . Diabetes Paternal Aunt   . Diabetes Paternal Grandmother   . Diabetes Paternal Grandfather     Social  History Social History   Tobacco Use  . Smoking status: Former Smoker    Packs/day: 1.00    Years: 2.00    Pack years: 2.00    Types: Cigarettes    Quit date: 05/05/1966    Years since quitting: 53.8  . Smokeless tobacco: Never Used  . Tobacco comment: quit age 47  Vaping Use  . Vaping Use: Never used  Substance Use Topics  . Alcohol use: Not Currently  . Drug use: No    Review of Systems  Review of Systems  Constitutional: Positive for fatigue. Negative for chills and fever.  HENT: Negative for sore throat.   Respiratory: Negative for shortness of breath.   Cardiovascular: Negative for chest pain.  Gastrointestinal: Positive for abdominal pain.  Genitourinary: Positive for discharge, dysuria and penile pain. Negative for flank pain.  Musculoskeletal: Negative for neck pain.  Skin: Negative for rash and wound.  Allergic/Immunologic: Negative for immunocompromised state.  Neurological: Positive for light-headedness. Negative for weakness and numbness.  Hematological: Does not bruise/bleed easily.  All other systems reviewed and are negative.    ____________________________________________  PHYSICAL EXAM:      VITAL SIGNS: ED Triage Vitals  Enc Vitals Group     BP 02/12/20 1731 (!) 85/56     Pulse Rate 02/12/20 1731 69     Resp 02/12/20 1731 18     Temp 02/12/20 1731 98.5 F (36.9 C)     Temp Source 02/12/20 1731 Oral     SpO2 02/12/20 1731 94 %     Weight 02/12/20 1732 270 lb (122.5 kg)     Height 02/12/20 1732 6\' 1"  (1.854 m)     Head Circumference --      Peak Flow --      Pain Score 02/12/20 1731 8     Pain Loc --      Pain Edu? --      Excl. in [place]? --      Physical Exam Vitals and nursing note reviewed.  Constitutional:      General: He is not in acute distress.    Appearance: He is well-developed.  HENT:     Head: Normocephalic and atraumatic.  Eyes:     Conjunctiva/sclera: Conjunctivae normal.  Cardiovascular:     Rate and Rhythm: Normal rate  and regular rhythm.     Heart sounds: Normal heart sounds. No murmur heard.  No friction rub.  Pulmonary:     Effort: Pulmonary effort is normal. No respiratory distress.     Breath sounds: Normal breath sounds. No wheezing or rales.  Abdominal:     General: There is no distension.     Palpations: Abdomen is soft.     Tenderness: There is no abdominal tenderness.     Comments: No SP tenderness or fullness  Genitourinary:    Comments: Foley in place. Moderate amount of purulent drainage around urethra outside of foley. Musculoskeletal:     Cervical back: Neck supple.  Skin:    General: Skin is warm.     Capillary Refill: Capillary refill takes less than 2 seconds.     Findings: No rash.  Neurological:     Mental Status: He is alert and oriented to person, place, and time.     Motor: No abnormal muscle tone.       ____________________________________________   LABS (all labs ordered are listed, but only abnormal results are displayed)  Labs Reviewed  BASIC METABOLIC PANEL - Abnormal; Notable for the following components:      Result Value   CO2 21 (*)    Glucose, Bld 163 (*)    BUN 62 (*)    Creatinine, Ser 2.19 (*)    GFR, Estimated 29 (*)    All other components within normal limits  CBC - Abnormal; Notable for the following components:   RBC 4.01 (*)    Hemoglobin 12.0 (*)    HCT 35.8 (*)    All other components within normal limits  URINE CULTURE  URINALYSIS, COMPLETE (UACMP) WITH MICROSCOPIC  CBC  BASIC METABOLIC PANEL  CBG MONITORING, ED  TYPE AND SCREEN    ____________________________________________  EKG: Normal sinus rhythm, VR 68. PR 156, QRS 84, QTc 427. No acute St elevations or depressions. No ischemia or infarct. ________________________________________  RADIOLOGY All imaging, including plain films, CT scans,  and ultrasounds, independently reviewed by me, and interpretations confirmed via formal radiology reads.  ED MD interpretation:   CT  Stone: pending, concerning for metastatic disease based on my prelim review with enlarged prostate, mild to moderate hydro despite foley in place, layering stones in bladder  Official radiology report(s): No results found.  ____________________________________________  PROCEDURES   Procedure(s) performed (including Critical Care):  Procedures  ____________________________________________  INITIAL IMPRESSION / MDM / [place] / ED COURSE  As part of my medical decision making, I reviewed the following data within the [place] notes reviewed and incorporated, Old chart reviewed, Notes from prior ED visits, and [place] Controlled Substance Database       *Timothy Maddox was evaluated in Emergency Department on 02/13/2020 for the symptoms described in the history of present illness. He was evaluated in the context of the global COVID-19 pandemic, which necessitated consideration that the patient might be at risk for infection with the SARS-CoV-2 virus that causes COVID-19. Institutional protocols and algorithms that pertain to the evaluation of patients at risk for COVID-19 are in a state of rapid change based on information released by regulatory bodies including the CDC and federal and state organizations. These policies and algorithms were followed during the patient's care in the ED.  Some ED evaluations and interventions may be delayed as a result of limited staffing during the pandemic.*     Medical Decision Making:  74 yo M here with fatigue, lightheadedness, penile pain s/p recent foley placement. On arrival, pt hypotensive which improved with fluids. Labs show significant likely prerenal AKI with BUN 62, Cr 2.19. K normal. CBC without leukocytosis or significant anemia. UA pending. Suspect CA-UTI with dehydration, also possible intermittent obstruction 2/2 hematuria/clots. Will start fluids, abx, discuss with Urology with plan for admission.  Dr.  [name] aware, will admit to Hospitalist Dr. Blaine Hamper. IV Cefepime, fluids given. Foley seems to be draining without difficulty now. Repeat BMP pending.  ____________________________________________  FINAL CLINICAL IMPRESSION(S) / ED DIAGNOSES  Final diagnoses:  AKI (acute kidney injury) ([place])  Urinary tract infection associated with indwelling urethral catheter, initial encounter ([place])     MEDICATIONS GIVEN DURING THIS VISIT:  Medications  lactated ringers bolus 1,000 mL (has no administration in time range)  sodium chloride 0.9 % bolus 1,000 mL (0 mLs Intravenous Stopped 02/13/20 0817)     ED Discharge Orders    None       Note:  This document was prepared using Dragon voice recognition software and may include unintentional dictation errors.   Duffy Bruce, MD 02/13/20 1113

## 2020-02-13 NOTE — Consult Note (Signed)
CODE SEPSIS - PHARMACY COMMUNICATION  **Broad Spectrum Antibiotics should be administered within 1 hour of Sepsis diagnosis**  Time Code Sepsis Called/Page Received: 2947  Antibiotics Ordered: Ceftriaxone 2g q24hours  Time of 1st antibiotic administration: 1036  Additional action taken by pharmacy: N/A   Sherilyn Banker, PharmD Pharmacy Resident  02/13/2020 10:06 AM

## 2020-02-13 NOTE — Progress Notes (Signed)
Cross Cover Note  Timothy Maddox is a 74 y.o. 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 Noted to have AKI on CK  creat 2.19 on admision.  Metabolic acidosis on this am labs noted with CO2 of 15  Developed new onset Afib RVR shortly after admission and was started on cardizem drip. Hemodynamicallly stable.  PE risk moderate  (Well's score)bsed on history   Repeat BMP shows resolution of acidosis. Creatinine  Fibrin derivatives (D dimer - 4052.75) Estimated GFR 73m creatineine 1.72 CTA chest ordered IMPRESSION: Acute pulmonary embolism. Moderate embolic burden. No CT evidence of right heart strain.  Moderate coronary artery calcification.  Mild global cardiomegaly.  Innumerable bilateral pulmonary masses and nodules as well as bilateral pathologic hilar adenopathy and widespread sclerotic osseous metastases in keeping with visceral and osseous metastatic disease related to the patient's underlying prostate cancer.  Aortic Atherosclerosis (ICD10-I70.0).  After discussion with patient risk and benefits of anticoagulation as relates to his history, patient agreed to and was  started on treatment dose IV heparin per pharmacy cosnsult Echo ordered Attending and oncologist to address areas of metastasis new vs known and treatement

## 2020-02-13 NOTE — ED Notes (Signed)
Lab at bedside to collect cultures °

## 2020-02-13 NOTE — H&P (Addendum)
History and Physical    Timothy Maddox MOQ:[id number] DOB: 12-Oct-1945 DOA: 02/13/2020  Referring MD/NP/PA:   PCP: [name], MD   Patient coming from:  The patient is coming from home.  At baseline, pt is partially dependent for most of ADL.        Chief Complaint: hematuria and lightheadedness  HPI: Timothy Maddox is a 74 y.o. 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  Pt has a h/o prostate CA, urethral stricture. He is s/p of recent cystoscopy and foley placement by Dr. [name] on 02/07/20.  Patient is scheduled to get PET scan on 10/14 for possible recurrence of prostate cancer. Pt states that he has been having sharp, severe, persistent pain around his Foley. He noticed dark bloody tinged urine. Patient has nausea and dry heaves, no diarrhea or abdominal pain.  He has generalized weakness and lightheadedness upon standing. Reports he has felt so weak that he had difficulty walking past several days.  No unilateral numbness or tingling to extremities.  No facial droop or slurred speech.  Patient denies chest pain, cough or shortness of breath.  Patient has hypotension initially with blood pressure 85/56, which improved to 137/78 after giving 1 L of normal saline and 1 L of LR in ED.   ED Course: pt was found to have WBC 9.7, urinalysis (cloudy appearance, trace amount of leukocyte, rare bacteria, WBC 11-20), pending COVID-19 PCR, worsening renal function, temperature normal, heart rate 90, 82, RR 20, oxygen saturation 95% on RA. Pending CT for renal stone protocol.  Patient is admitted to MedSurg bed as inpt.  Dr. [name] of urology is consulted.    Review of Systems:   General: no fevers, chills, no body weight gain, has fatigue HEENT: no blurry vision, hearing changes or sore throat Respiratory: no dyspnea, coughing,  wheezing CV: no chest pain, no palpitations GI: has nausea, no vomiting, abdominal pain, diarrhea, constipation GU: has dysuria and hematuria Ext: no leg edema Neuro: no unilateral weakness, numbness, or tingling, no vision change or hearing loss.  Has lightheadedness. Skin: no rash, no skin tear. MSK: No muscle spasm, no deformity, no limitation of range of movement in spin Heme: No easy bruising.  Travel history: No recent long distant travel.  Allergy:  Allergies  Allergen Reactions  . Poison Oak Extract Rash  . Poison Ivy Extract Rash    Past Medical History:  Diagnosis Date  . Anxiety   . Arthritis    lower back, shoulders  . AVM (arteriovenous malformation) brain   . Cancer [facility])    prostate- treated with radiation  . CVA (cerebral infarction) 11/2015  . Depression   . Diabetes mellitus without complication ([place])   . Dyspnea    with exertion  . HOH (hard of hearing)   . Hyperlipidemia   . Hypertension   . Pneumonia    1st grade  . Pre-diabetes   . Prediabetes   . Seizures ([place]) 12/2015  . Sleep apnea   . Stroke [facility])    speech impairment, no weakness    Past Surgical History:  Procedure Laterality Date  . BACK SURGERY  1999   disectomy  . BACK SURGERY     2 surgery- injury  . COLONOSCOPY W/ POLYPECTOMY    . COLONOSCOPY WITH PROPOFOL N/A 02/28/2019   Procedure: COLONOSCOPY WITH PROPOFOL;  Surgeon: Toledo, Benay Pike, MD;  Location: [facility] ENDOSCOPY;  Service: Gastroenterology;  Laterality: N/A;  . CYSTOSCOPY WITH URETHRAL DILATATION Bilateral 02/07/2020   Procedure: CYSTOSCOPY WITH URETHRAL DILATATION;  Surgeon: Abbie Sons, MD;  Location: [facility] ORS;  Service: Urology;  Laterality: Bilateral;  . HAMMER TOE SURGERY Bilateral   . INGUINAL HERNIA REPAIR Right    per patient when he was in 1st grade  . IR GENERIC HISTORICAL  12/05/2015   IR RADIOLOGIST EVAL & MGMT 12/05/2015 [facility]  . IR GENERIC HISTORICAL  01/31/2016   IR RADIOLOGIST EVAL & MGMT  01/31/2016 [facility]  . IR GENERIC HISTORICAL  02/20/2016   IR ANGIO INTRA EXTRACRAN SEL COM CAROTID INNOMINATE UNI L MOD SED 02/20/2016 [name], MD [facility]  . IR GENERIC HISTORICAL  02/20/2016   IR 3D INDEPENDENT WKST 02/20/2016 [name], MD [facility]  . KNEE SURGERY Bilateral 2015  . RADIOLOGY WITH ANESTHESIA N/A 02/20/2016   Procedure: EMBOLIZATION;  Surgeon: [name], MD;  Location: [place];  Service: Radiology;  Laterality: N/A;  . TOE SURGERY Left 2017   TOE NAIL REMOVAL  . TOTAL KNEE ARTHROPLASTY Right 07/20/2017   Procedure: TOTAL KNEE ARTHROPLASTY;  Surgeon: Lovell Sheehan, MD;  Location: [facility] ORS;  Service: Orthopedics;  Laterality: Right;    Social History:  reports that he quit smoking about 53 years ago. His smoking use included cigarettes. He has a 2.00 pack-year smoking history. He has never used smokeless tobacco. He reports previous alcohol use. He reports that he does not use drugs.  Family History:  Family History  Problem Relation Age of Onset  . Heart failure Father   . Diabetes Father   . Hypertension Father   . Alzheimer's disease Father 72  . Other Mother 85       homicide  . Alcohol abuse Mother   . Pancreatic cancer Sister   . Alcohol abuse Sister   . Diabetes Brother   . Diabetes Paternal Aunt   . Diabetes Paternal Grandmother   . Diabetes Paternal Grandfather      Prior to Admission medications   Medication Sig Start Date End Date Taking? Authorizing Provider  amLODipine (NORVASC) 10 MG tablet Take 10 mg by mouth daily.   Yes [provider]  levETIRAcetam (ROWEEPRA) 500 MG tablet Take 500 mg by mouth 2 (two) times daily.   Yes [provider]  losartan-hydrochlorothiazide (HYZAAR) 100-25 MG tablet Take 1 tablet by mouth daily. 05/26/17  Yes [provider]  metFORMIN (GLUCOPHAGE-XR) 500 MG 24 hr tablet Take 500 mg by mouth 2 (two) times daily. 02/06/20  Yes [provider]   metoprolol tartrate (LOPRESSOR) 100 MG tablet Take 100 mg by mouth 2 (two) times daily. 07/27/18  Yes [provider]  NUBEQA 300 MG tablet TAKE 2 TABLETS (600MG ) BY MOUTH TWO TIMES DAILY WITH A MEAL. Patient taking differently: Take 600 mg by mouth in the morning and at bedtime.  11/22/19  Yes Earlie Server, MD  oxyCODONE-acetaminophen (PERCOCET/ROXICET) 5-325 MG tablet Take 1 tablet by mouth every 8 (eight) hours as needed for severe pain. 02/07/20  Yes Stoioff, Ronda Fairly, MD  rosuvastatin (CRESTOR) 5 MG tablet Take 5 mg by mouth at bedtime.  11/10/17  Yes [provider]  spironolactone (ALDACTONE) 100 MG tablet Take 100 mg by mouth 2 (two) times daily.   Yes [provider]  acetaminophen (TYLENOL) 500 MG tablet Take 500 mg by mouth every 6 (six) hours as  needed (pain.).     [provider]  tamsulosin (FLOMAX) 0.4 MG CAPS capsule TAKE ONE CAPSULE BY MOUTH ONCE DAILY Patient not taking: Reported on 02/13/2020 02/08/20   Earlie Server, MD    Physical Exam: Vitals:   02/13/20 1330 02/13/20 1400 02/13/20 1430 02/13/20 1431  BP: 110/75 111/81 123/76 123/76  Pulse: (!) 168 (!) 141 (!) 157 (!) 148  Resp:  (!) 27 (!) 25   Temp:      TempSrc:      SpO2: 97% 93% 95%   Weight:      Height:       General: Not in acute distress HEENT:       Eyes: PERRL, EOMI, no scleral icterus.       ENT: No discharge from the ears and nose, no pharynx injection, no tonsillar enlargement.        Neck: No JVD, no bruit, no mass felt. Heme: No neck lymph node enlargement. Cardiac: S1/S2, RRR, No murmurs, No gallops or rubs. Respiratory:  No rales, wheezing, rhonchi or rubs. GI: Soft, nondistended, nontender, no rebound pain, no organomegaly, BS present. GU: has hematuria Ext: No pitting leg edema bilaterally. 1+DP/PT pulse bilaterally. Musculoskeletal: No joint deformities, No joint redness or warmth, no limitation of ROM in spin. Skin: No rashes.  Neuro: Alert, oriented X3, cranial  nerves II-XII grossly intact, moves all extremities normally Psych: Patient is not psychotic, no suicidal or hemocidal ideation.  Labs on Admission: I have personally reviewed following labs and imaging studies  CBC: Recent Labs  Lab 02/12/20 1758 02/13/20 0817  WBC 10.1 9.7  HGB 12.0* 12.3*  HCT 35.8* 37.9*  MCV 89.3 92.4  PLT 287 448   Basic Metabolic Panel: Recent Labs  Lab 02/12/20 1758 02/13/20 0817  NA 137 135  K 4.5 5.4*  CL 102 104  CO2 21* 15*  GLUCOSE 163* 143*  BUN 62* 56*  CREATININE 2.19* 1.85*  CALCIUM 9.9 9.7   GFR: Estimated Creatinine Clearance: 48.7 mL/min (A) (by C-G formula based on SCr of 1.85 mg/dL (H)). Liver Function Tests: No results for input(s): AST, ALT, ALKPHOS, BILITOT, PROT, ALBUMIN in the last 168 hours. No results for input(s): LIPASE, AMYLASE in the last 168 hours. No results for input(s): AMMONIA in the last 168 hours. Coagulation Profile: Recent Labs  Lab 02/13/20 1022  INR 1.2   Cardiac Enzymes: No results for input(s): CKTOTAL, CKMB, CKMBINDEX, TROPONINI in the last 168 hours. BNP (last 3 results) No results for input(s): PROBNP in the last 8760 hours. HbA1C: No results for input(s): HGBA1C in the last 72 hours. CBG: Recent Labs  Lab 02/07/20 1001 02/07/20 1308 02/13/20 1133  GLUCAP 133* 126* 145*   Lipid Profile: No results for input(s): CHOL, HDL, LDLCALC, TRIG, CHOLHDL, LDLDIRECT in the last 72 hours. Thyroid Function Tests: No results for input(s): TSH, T4TOTAL, FREET4, T3FREE, THYROIDAB in the last 72 hours. Anemia Panel: No results for input(s): VITAMINB12, FOLATE, FERRITIN, TIBC, IRON, RETICCTPCT in the last 72 hours. Urine analysis:    Component Value Date/Time   COLORURINE YELLOW (A) 02/13/2020 0846   APPEARANCEUR CLOUDY (A) 02/13/2020 0846   APPEARANCEUR Cloudy (A) 01/19/2020 1435   LABSPEC 1.015 02/13/2020 0846   PHURINE 5.0 02/13/2020 0846   GLUCOSEU NEGATIVE 02/13/2020 0846   HGBUR LARGE (A)  02/13/2020 0846   BILIRUBINUR NEGATIVE 02/13/2020 0846   BILIRUBINUR Negative 01/19/2020 1435   KETONESUR NEGATIVE 02/13/2020 0846   PROTEINUR 30 (A) 02/13/2020 0846   NITRITE  NEGATIVE 02/13/2020 0846   LEUKOCYTESUR TRACE (A) 02/13/2020 0846   Sepsis Labs: @LABRCNTIP (procalcitonin:4,lacticidven:4) ) Recent Results (from the past 240 hour(s))  Resp Panel by RT PCR (RSV, Flu A&B, Covid) - Nasopharyngeal Swab     Status: None   Collection Time: 02/13/20  9:59 AM   Specimen: Nasopharyngeal Swab  Result Value Ref Range Status   SARS Coronavirus 2 by RT PCR NEGATIVE NEGATIVE Final    Comment: (NOTE) SARS-CoV-2 target nucleic acids are NOT DETECTED.  The SARS-CoV-2 RNA is generally detectable in upper respiratoy specimens during the acute phase of infection. The lowest concentration of SARS-CoV-2 viral copies this assay can detect is 131 copies/mL. A negative result does not preclude SARS-Cov-2 infection and should not be used as the sole basis for treatment or other patient management decisions. A negative result may occur with  improper specimen collection/handling, submission of specimen other than nasopharyngeal swab, presence of viral mutation(s) within the areas targeted by this assay, and inadequate number of viral copies (<131 copies/mL). A negative result must be combined with clinical observations, patient history, and epidemiological information. The expected result is Negative.  Fact Sheet for Patients:  PinkCheek.be  Fact Sheet for Healthcare Providers:  GravelBags.it  This test is no t yet approved or cleared by the [place] FDA and  has been authorized for detection and/or diagnosis of SARS-CoV-2 by FDA under an Emergency Use Authorization (EUA). This EUA will remain  in effect (meaning this test can be used) for the duration of the COVID-19 declaration under Section 564(b)(1) of the Act, 21  U.S.C. section 360bbb-3(b)(1), unless the authorization is terminated or revoked sooner.     Influenza A by PCR NEGATIVE NEGATIVE Final   Influenza B by PCR NEGATIVE NEGATIVE Final    Comment: (NOTE) The Xpert Xpress SARS-CoV-2/FLU/RSV assay is intended as an aid in  the diagnosis of influenza from Nasopharyngeal swab specimens and  should not be used as a sole basis for treatment. Nasal washings and  aspirates are unacceptable for Xpert Xpress SARS-CoV-2/FLU/RSV  testing.  Fact Sheet for Patients: PinkCheek.be  Fact Sheet for Healthcare Providers: GravelBags.it  This test is not yet approved or cleared by the [place] FDA and  has been authorized for detection and/or diagnosis of SARS-CoV-2 by  FDA under an Emergency Use Authorization (EUA). This EUA will remain  in effect (meaning this test can be used) for the duration of the  Covid-19 declaration under Section 564(b)(1) of the Act, 21  U.S.C. section 360bbb-3(b)(1), unless the authorization is  terminated or revoked.    Respiratory Syncytial Virus by PCR NEGATIVE NEGATIVE Final    Comment: (NOTE) Fact Sheet for Patients: PinkCheek.be  Fact Sheet for Healthcare Providers: GravelBags.it  This test is not yet approved or cleared by the [place] FDA and  has been authorized for detection and/or diagnosis of SARS-CoV-2 by  FDA under an Emergency Use Authorization (EUA). This EUA will remain  in effect (meaning this test can be used) for the duration of the  COVID-19 declaration under Section 564(b)(1) of the Act, 21 U.S.C.  section 360bbb-3(b)(1), unless the authorization is terminated or  revoked. Performed at [facility], [address], [place], [place] [address]      Radiological Exams on Admission: CT Renal Stone Study  Result Date: 02/13/2020 CLINICAL DATA:  Prostate cancer.  Pink colored urine. Hypotension. Flank pain. EXAM: CT ABDOMEN AND PELVIS WITHOUT CONTRAST TECHNIQUE: Multidetector CT imaging of the abdomen and pelvis was performed  following the standard protocol without IV contrast. COMPARISON:  12/23/2017 CT abdomen/pelvis. FINDINGS: Lower chest: Numerous (greater than 20) solid pulmonary nodules and masses at both lung bases, largest 5.2 cm in the basilar right lower lobe (series 4/image 27) and 5.5 cm in the basilar left lower lobe (series 4/image 27), all new. Atherosclerosis. Hepatobiliary: Multiple (at least 5) hypodense liver masses scattered throughout the liver, largest 3.8 cm in the segment 6 right liver lobe (series 2/image 36) and 2.0 cm in the superior right liver (series 2/image 22), all new. Cholelithiasis. No gallbladder wall thickening or pericholecystic fluid. No biliary ductal dilatation. Pancreas: Normal, with no mass or duct dilation. Spleen: Normal size. No mass. Adrenals/Urinary Tract: Normal right adrenal. Left adrenal 1.5 cm nodule with density 17 HU, stable, most compatible with an adenoma. Moderate bilateral hydroureteronephrosis to the level of the ureterovesical junctions bilaterally. No renal stones. No ureteral stones. Simple 3.7 cm posterior lower left renal cyst. No additional contour deforming renal lesions. Multiple layering stones in distended bladder, largest 19 mm. Chronic mild diffuse bladder wall thickening is unchanged. Foley catheter in place with balloon in the dependent bladder. Stomach/Bowel: Normal non-distended stomach. Normal caliber small bowel with no small bowel wall thickening. Normal appendix. Normal large bowel with no diverticulosis, large bowel wall thickening or pericolonic fat stranding. Vascular/Lymphatic: Atherosclerotic nonaneurysmal abdominal aorta. No pathologically enlarged lymph nodes in the abdomen or pelvis. Reproductive: Newly mildly enlarged, irregular and poorly marginated prostate, directly contiguous with the  bladder trigone. Fiducial markers noted in the prostate as before. Other: No pneumoperitoneum, ascites or focal fluid collection. Solid subcutaneous 2.9 cm lateral right chest wall mass (series 2/image 29), new. Similar solid subcutaneous 4.4 cm left gluteal mass (series 2/image 63). Small fat containing umbilical hernia is stable. Musculoskeletal: Numerous new sclerotic osseous lesions throughout the visualized skeleton including multiple lower ribs bilaterally, multiple thoracolumbar vertebrae largest at T11 and throughout the bilateral pelvic girdle, most prominent in the left iliac crest (series 2/image 60). Marked lumbar spondylosis. IMPRESSION: 1. Numerous new solid pulmonary nodules and masses at both lung bases, largest 5.2 cm in the basilar right lower lobe, compatible with pulmonary metastases. 2. New widespread sclerotic osseous metastases throughout the visualized skeleton as detailed. 3. Multiple new hypodense liver masses compatible with liver metastases. 4. Newly mildly enlarged, irregular and poorly marginated prostate, directly contiguous with the bladder trigone, suggestive of recurrent locally advanced prostate cancer. 5. Moderate bilateral hydroureteronephrosis to the level of the UVJ bilaterally, probably due to bladder outlet obstruction despite the well-positioned Foley catheter in the bladder. 6. Multiple layering bladder stones in the distended bladder. 7. Cholelithiasis. 8. Stable left adrenal adenoma. 9. Aortic Atherosclerosis (ICD10-I70.0). Electronically Signed   By: Ilona Sorrel M.D.   On: 02/13/2020 10:06     EKG: I have personally reviewed.  Sinus rhythm, QTC 427, possible LAE, no ischemic change.  Assessment/Plan Principal Problem:   Complicated UTI (urinary tract infection) Active Problems:   Stroke (HCC)   Seizures (HCC)   Prostate cancer (HCC)   Hypotension   Hypertension   Type II diabetes mellitus with renal manifestations (HCC)   Acute renal failure superimposed  on stage 3a chronic kidney disease (HCC)   Chronic diastolic CHF (congestive heart failure) ([place])   Complicated UTI (urinary tract infection): Patient had hypotension which responded to the IV fluid.  No leukocytosis or fever.  Does not meet criteria for sepsis.  -Placed on MedSurg bed of observation -IV Rocephin -Follow-up of blood culture, urine culture -  will get Procalcitonin and trend lactic acid levels  -IVF: 2L of IVF bolus in ED, followed by 75 cc/h  -Dr. [name] of urology is consulted, follow-up recommendations -Follow-up CT scanning per renal stone protocol  Prostate cancer [facility]): s/p of RXT. Patient is scheduled to get PET scan on 10/14 for possible recurrence of prostate cancer. -f/u with Dr. [name] recommendations -continue Nubeqa -f/u with Dr. [name] of oncology  Stroke [facility]) -Lipitor  Seizure -Seizure precaution -When necessary Ativan for seizure -Continue Home medications: Keppra  Hypotension and lightheadedness: Patient has hypotension initially with blood pressure 85/56, which improved to 137/78 after giving 1 L of normal saline and 1 L of LR in ED. Possibly due to dehydration and continuation of her multiple blood pressure medications and diuretics.  Patient does not meet criteria for sepsis.  His lightheadedness is likely due to hypotension. -IV fluid as above -PT/OT -Hold all blood pressure medications  Hypertension -Hold oral blood pressure medications except for metoprolol due to hypotension. Decreased metoprolol dose from 100 to 50 mg bid. -IV hydralazine as needed  Type II diabetes mellitus with renal manifestations [facility]): Recent A1c 6.3, well controlled.  Patient taking Metformin -SSI  Acute renal failure superimposed on stage 3a chronic kidney disease ([place]): Recent baseline creatinine 1.2-1.6.  His creatinine is at 2.19, BUN 62.  Likely multifactorial etiology, including dehydration and continuation of Hyazaa and spironolactone, UTI, possible ATN  secondary to hypotension -IVF as above -Avoid using liver toxic medications -Hold all blood pressure medications including spironolactone and Hyazarr  Chronic diastolic CHF (congestive heart failure) ([place]): 2D echo on 11/14/2015 showed EF of 65-70% with grade 1 diastolic dysfunction.  Patient does not have leg edema.  No JVD.  No respiratory distress, CHF seem to be compensated. -Hold spironolactone -Check BNP  Addendum:   New onset A fib with RVR: Patient developed a new onset atrial fibrillation the afternoon, with heart rate up to 140s -Cardizem gtt -Continue metoprolol -Troponin x3 -Check TSH -2D echo -Consulted cardiology, Dr.Khan  Addendum: CTA showed PE -IV heparin started last night -will get LE doppler to r/o PE.   Status is: Inpatient  Remains inpatient appropriate because:Inpatient level of care appropriate due to severity of illness.  Patient has multiple comorbidities, now presents with complicated UTI and new onset of atrial fibrillation with RVR.  His presentation is highly complicated.  Patient is at high risk of deteriorating.  Need to be treated in hospital for at least 2 days.   Dispo: The patient is from: Home              Anticipated d/c is to: Home              Anticipated d/c date is: 2 days              Patient currently is not medically stable to d/c.           DVT ppx: SCD Code Status: Full code Family Communication:   Yes, patient's  wife at bed side Disposition Plan:  Anticipate discharge back to previous environment Consults called: Dr. [name] of urology Admission status: Med-surg bed as inpt                          Date of Service 02/13/2020    [facility]   If 7PM-7AM, please contact night-coverage [email or web address] 02/13/2020, 3:30 PM

## 2020-02-13 NOTE — Progress Notes (Signed)
PT Cancellation Note  Patient Details Name: Timothy Maddox MRN: [id number] DOB: 1945/12/15   Cancelled Treatment:    Reason Eval/Treat Not Completed: Medical issues which prohibited therapy: Consult received, chart reviewed. Pt noted with recent elevated HR of 140's - 150's. Per notes, RN notified MD. Pt's K+ also 5.4 falling outside guidelines for participation in therapy. Will hold PT evaluation at this time and will attempt to see pt at a future date/time as medically appropriate.   Linus Salmons PT, DPT 02/13/20, 3:32 PM

## 2020-02-13 NOTE — Progress Notes (Signed)
PHARMACY -  BRIEF ANTIBIOTIC NOTE   Pharmacy has received consult(s) for Cefepime from an ED provider.  The patient's profile has been reviewed for ht/wt/allergies/indication/available labs.    One time order(s) placed for Cefepime 2g IV   Further antibiotics/pharmacy consults should be ordered by admitting physician if indicated.                       Thank you, Pernell Dupre, PharmD, BCPS Clinical Pharmacist 02/13/2020 9:43 AM

## 2020-02-13 NOTE — ED Notes (Addendum)
See triage note, pt reports pain to penis where foley placed, states has had pain since it was placed. Reports leaking as well around foley site Dizziness this morning and weakness

## 2020-02-14 ENCOUNTER — Inpatient Hospital Stay: Payer: Medicare HMO

## 2020-02-14 ENCOUNTER — Inpatient Hospital Stay
Admit: 2020-02-14 | Discharge: 2020-02-14 | Disposition: A | Discharge status: Home / Self Care | Payer: Medicare HMO | Attending: Nurse Practitioner | Admitting: Nurse Practitioner

## 2020-02-14 DIAGNOSIS — N390 Urinary tract infection, site not specified: Secondary | ICD-10-CM | POA: Diagnosis not present

## 2020-02-14 DIAGNOSIS — I4891 Unspecified atrial fibrillation: Secondary | ICD-10-CM

## 2020-02-14 LAB — GLUCOSE, CAPILLARY
Glucose-Capillary: 136 mg/dL — ABNORMAL HIGH (ref 70–99)
Glucose-Capillary: 138 mg/dL — ABNORMAL HIGH (ref 70–99)
Glucose-Capillary: 141 mg/dL — ABNORMAL HIGH (ref 70–99)
Glucose-Capillary: 138 mg/dL — ABNORMAL HIGH (ref 70–99)
Glucose-Capillary: 137 mg/dL — ABNORMAL HIGH (ref 70–99)

## 2020-02-14 LAB — ECHOCARDIOGRAM COMPLETE
AV Mean grad: 3 mmHg
AV Peak grad: 7.3 mmHg
Ao pk vel: 1.35 m/s
Area-P 1/2: 4.49 cm2
Single Plane A4C EF: 70.7 %

## 2020-02-14 LAB — IRON AND TIBC
Iron: 29 ug/dL — ABNORMAL LOW (ref 45–182)
Saturation Ratios: 17 % — ABNORMAL LOW (ref 17.9–39.5)
TIBC: 167 ug/dL — ABNORMAL LOW (ref 250–450)
UIBC: 138 ug/dL

## 2020-02-14 LAB — VITAMIN B12: Vitamin B-12: 232 pg/mL (ref 180–914)

## 2020-02-14 LAB — BASIC METABOLIC PANEL
Anion gap: 13 (ref 5–15)
BUN: 48 mg/dL — ABNORMAL HIGH (ref 8–23)
CO2: 19 mmol/L — ABNORMAL LOW (ref 22–32)
Calcium: 9.6 mg/dL (ref 8.9–10.3)
Chloride: 104 mmol/L (ref 98–111)
Creatinine, Ser: 1.73 mg/dL — ABNORMAL HIGH (ref 0.61–1.24)
GFR, Estimated: 38 mL/min — ABNORMAL LOW (ref >60)
Glucose, Bld: 167 mg/dL — ABNORMAL HIGH (ref 70–99)
Potassium: 4.1 mmol/L (ref 3.5–5.1)
Sodium: 136 mmol/L (ref 135–145)

## 2020-02-14 LAB — FOLATE: Folate: 7.4 ng/mL (ref >5.9)

## 2020-02-14 LAB — CBC
HCT: 32.2 % — ABNORMAL LOW (ref 39.0–52.0)
Hemoglobin: 10.8 g/dL — ABNORMAL LOW (ref 13.0–17.0)
MCH: 29.8 pg (ref 26.0–34.0)
MCHC: 33.5 g/dL (ref 30.0–36.0)
MCV: 89 fL (ref 80.0–100.0)
Platelets: 275 10*3/uL (ref 150–400)
RBC: 3.62 MIL/uL — ABNORMAL LOW (ref 4.22–5.81)
RDW: 13.7 % (ref 11.5–15.5)
WBC: 10.4 10*3/uL (ref 4.0–10.5)
nRBC: 0 % (ref 0.0–0.2)

## 2020-02-14 LAB — HEPARIN LEVEL (UNFRACTIONATED)
Heparin Unfractionated: 0.32 IU/mL (ref 0.30–0.70)
Heparin Unfractionated: 0.23 IU/mL — ABNORMAL LOW (ref 0.30–0.70)

## 2020-02-14 LAB — TROPONIN I (HIGH SENSITIVITY)
Troponin I (High Sensitivity): 17 ng/L (ref <18)
Troponin I (High Sensitivity): 14 ng/L (ref <18)

## 2020-02-14 LAB — RETICULOCYTES
Immature Retic Fract: 21.1 % — ABNORMAL HIGH (ref 2.3–15.9)
RBC.: 3.6 MIL/uL — ABNORMAL LOW (ref 4.22–5.81)
Retic Count, Absolute: 63.4 10*3/uL (ref 19.0–186.0)
Retic Ct Pct: 1.8 % (ref 0.4–3.1)

## 2020-02-14 LAB — TSH: TSH: 2.051 u[IU]/mL (ref 0.350–4.500)

## 2020-02-14 LAB — FERRITIN: Ferritin: 827 ng/mL — ABNORMAL HIGH (ref 24–336)

## 2020-02-14 MED ORDER — PERFLUTREN LIPID MICROSPHERE
1.0000 mL | INTRAVENOUS | Status: AC | PRN
  Administered 2020-02-14: 2 mL via INTRAVENOUS
  Filled 2020-02-14: qty 10

## 2020-02-14 MED ORDER — AMIODARONE HCL 200 MG PO TABS
200.0000 mg | ORAL_TABLET | Freq: Every day | ORAL | Status: DC
  Administered 2020-02-14: 200 mg via ORAL
  Filled 2020-02-14 (×2): qty 1

## 2020-02-14 MED ORDER — SODIUM CHLORIDE 0.9% FLUSH
3.0000 mL | INTRAVENOUS | Status: DC | PRN
  Administered 2020-02-14: 3 mL via INTRAVENOUS

## 2020-02-14 MED ORDER — DAROLUTAMIDE 300 MG PO TABS
600.0000 mg | ORAL_TABLET | Freq: Two times a day (BID) | ORAL | Status: DC
  Filled 2020-02-14 (×6): qty 2

## 2020-02-14 MED ORDER — METOPROLOL TARTRATE 5 MG/5ML IV SOLN
5.0000 mg | Freq: Once | INTRAVENOUS | Status: AC
  Administered 2020-02-14: 5 mg via INTRAVENOUS
  Filled 2020-02-14: qty 5

## 2020-02-14 MED ORDER — HEPARIN BOLUS VIA INFUSION
5000.0000 [IU] | Freq: Once | INTRAVENOUS | Status: AC
  Administered 2020-02-14: 5000 [IU] via INTRAVENOUS
  Filled 2020-02-14: qty 5000

## 2020-02-14 MED ORDER — CHLORHEXIDINE GLUCONATE CLOTH 2 % EX PADS
6.0000 | MEDICATED_PAD | Freq: Every day | CUTANEOUS | Status: DC
  Administered 2020-02-14 – 2020-02-23 (×9): 6 via TOPICAL

## 2020-02-14 MED ORDER — HEPARIN (PORCINE) 25000 UT/250ML-% IV SOLN
2000.0000 [IU]/h | INTRAVENOUS | Status: AC
  Administered 2020-02-14 (×2): 1700 [IU]/h via INTRAVENOUS
  Administered 2020-02-15 (×2): 1850 [IU]/h via INTRAVENOUS
  Administered 2020-02-16 – 2020-02-17 (×2): 2000 [IU]/h via INTRAVENOUS
  Filled 2020-02-14 (×6): qty 250

## 2020-02-14 NOTE — Progress Notes (Signed)
PT Cancellation Note  Patient Details Name: Timothy Maddox MRN: [id number] DOB: 07/22/1945   Cancelled Treatment:    Reason Eval/Treat Not Completed: Patient not medically ready Chart reviewed. Pt imaging positive for acute PE. Anti-coagulation initiated on 10/12 at 2:54am. Per therapy protocols, will hold 48 hours prior to initiation of PT services.    Gwenlyn Saran, PT, DPT 02/14/20, 9:35 AM

## 2020-02-14 NOTE — ED Notes (Signed)
Late entry- assumed care of pt at 2300. Pt resting in bed with side rails up x2, foley catheter draining, pt reports 5/10 intermittent central lower abdominal pressure. AO x4. HOH. Talking in low voice but full sentences with breathing regular and unlabored. Provided with ginger ale per request.

## 2020-02-14 NOTE — Consult Note (Addendum)
Timothy Maddox is a 74 y.o. male  [id number]  Primary Cardiologist: Neoma Laming Reason for Consultation: A fib with RVR  HPI: Patient is a 74 year old male with past medical history of HFpEF, hypertension, hyperlipidemia, DM, stroke, and CKD III who presents to the emergency department with hematuria  and lightheadedness.  Patient initially hypotensive with improvement with IVF.  Patient then developed atrial fibrillation with RVR.  Patient also found to have a pulmonary embolus on CTA chest.  We have been consulted for management of his HFpEF and new onset A. fib.   Review of Systems: Patient denies chest pain or shortness of breath.  Patient also denies lower extremity edema or dizziness at this time.  On complaint is bladder pressure   Past Medical History:  Diagnosis Date   Anxiety    Arthritis    lower back, shoulders   AVM (arteriovenous malformation) brain    Cancer (HCC)    prostate- treated with radiation   CVA (cerebral infarction) 11/2015   Depression    Diabetes mellitus without complication (HCC)    Dyspnea    with exertion   HOH (hard of hearing)    Hyperlipidemia    Hypertension    Pneumonia    1st grade   Pre-diabetes    Prediabetes    Seizures ([place]) 12/2015   Sleep apnea    Stroke (HCC)    speech impairment, no weakness    (Not in a hospital admission)     amiodarone  200 mg Oral Daily   insulin aspart  0-5 Units Subcutaneous QHS   insulin aspart  0-9 Units Subcutaneous TID WC   levETIRAcetam  500 mg Oral BID   metoprolol tartrate  50 mg Oral BID   rosuvastatin  5 mg Oral QHS    Infusions:  sodium chloride 75 mL/hr at 02/14/20 0250   cefTRIAXone (ROCEPHIN)  IV 2 g (02/14/20 0904)   heparin 1,700 Units/hr (02/14/20 0254)    Allergies  Allergen Reactions   Poison Oak Extract Rash   Poison Ivy Extract Rash    Social History   Socioeconomic History   Marital status: Married    Spouse name: Not on file   Number of children: Not on  file   Years of education: Not on file   Highest education level: Not on file  Occupational History   Occupation: retired Curator  Tobacco Use   Smoking status: Former Smoker    Packs/day: 1.00    Years: 2.00    Pack years: 2.00    Types: Cigarettes    Quit date: 05/05/1966    Years since quitting: 53.8   Smokeless tobacco: Never Used   Tobacco comment: quit age 14  Vaping Use   Vaping Use: Never used  Substance and Sexual Activity   Alcohol use: Not Currently   Drug use: No   Sexual activity: Not Currently  Other Topics Concern   Not on file  Social History Narrative   Not on file   Social Determinants of Health   Financial Resource Strain:    Difficulty of Paying Living Expenses: Not on file  Food Insecurity:    Worried About Estate manager/land agent of Food in the Last Year: Not on file   [employer] of Food in the Last Year: Not on file  Transportation Needs:    Lack of Transportation (Medical): Not on file   Lack of Transportation (Non-Medical): Not on file  Physical Activity:    Days of  Exercise per Week: Not on file   Minutes of Exercise per Session: Not on file  Stress:    Feeling of Stress : Not on file  Social Connections:    Frequency of Communication with Friends and Family: Not on file   Frequency of Social Gatherings with Friends and Family: Not on file   Attends Religious Services: Not on file   Active Member of Clubs or Organizations: Not on file   Attends Archivist Meetings: Not on file   Marital Status: Not on file  Intimate Partner Violence:    Fear of Current or Ex-Partner: Not on file   Emotionally Abused: Not on file   Physically Abused: Not on file   Sexually Abused: Not on file    Family History  Problem Relation Age of Onset   Heart failure Father    Diabetes Father    Hypertension Father    Alzheimer's disease Father 50   Other Mother 6       homicide   Alcohol abuse Mother    Pancreatic cancer Sister    Alcohol abuse Sister     Diabetes Brother    Diabetes Paternal Aunt    Diabetes Paternal Grandmother    Diabetes Paternal Grandfather     PHYSICAL EXAM: Vitals:   02/14/20 0606 02/14/20 0901  BP: 132/74 132/71  Pulse: 65 70  Resp: 12   Temp:    SpO2: 94%      Intake/Output Summary (Last 24 hours) at 02/14/2020 1011 Last data filed at 02/13/2020 1915 Gross per 24 hour  Intake --  Output 1300 ml  Net -1300 ml    General:  Well appearing. No respiratory difficulty HEENT: normal Neck: supple. no JVD. Carotids 2+ bilat; no bruits. No lymphadenopathy or thryomegaly appreciated. Cor: PMI nondisplaced. Regular rate & rhythm. No rubs, gallops or murmurs. Lungs: clear Abdomen: soft, nontender, nondistended. No hepatosplenomegaly. No bruits or masses. Good bowel sounds. Extremities: no cyanosis, clubbing, rash, edema Neuro: alert & oriented x 3, cranial nerves grossly intact. moves all 4 extremities w/o difficulty. Affect pleasant.  ECG: 10/11 1448. Atrial fibrillation with RVR.  142/bpm  Results for orders placed or performed during the hospital encounter of 02/13/20 (from the past 24 hour(s))  Culture, blood (Routine X 2) w Reflex to ID Panel     Status: None (Preliminary result)   Collection Time: 02/13/20 10:22 AM   Specimen: BLOOD  Result Value Ref Range   Specimen Description BLOOD LEFT HAND    Special Requests      BOTTLES DRAWN AEROBIC AND ANAEROBIC Blood Culture results may not be optimal due to an inadequate volume of blood received in culture bottles   Culture      NO GROWTH < 24 HOURS Performed at [facility], [address] [place], [place] [address]    Report Status PENDING   Lactic acid, plasma     Status: Abnormal   Collection Time: 02/13/20 10:22 AM  Result Value Ref Range   Lactic Acid, Venous 3.4 (HH) 0.5 - 1.9 mmol/L  Protime-INR     Status: None   Collection Time: 02/13/20 10:22 AM  Result Value Ref Range   Prothrombin Time 15.0 11.4 - 15.2 seconds   INR 1.2 0.8  - 1.2  APTT     Status: Abnormal   Collection Time: 02/13/20 10:22 AM  Result Value Ref Range   aPTT 38 (H) 24 - 36 seconds  Procalcitonin     Status: None  Collection Time: 02/13/20 10:22 AM  Result Value Ref Range   Procalcitonin 0.42 ng/mL  Brain natriuretic peptide     Status: None   Collection Time: 02/13/20 10:22 AM  Result Value Ref Range   B Natriuretic Peptide 94.4 0.0 - 100.0 pg/mL  Culture, blood (Routine X 2) w Reflex to ID Panel     Status: None (Preliminary result)   Collection Time: 02/13/20 10:30 AM   Specimen: BLOOD  Result Value Ref Range   Specimen Description BLOOD LEFT ARM    Special Requests      BOTTLES DRAWN AEROBIC AND ANAEROBIC Blood Culture adequate volume   Culture      NO GROWTH < 24 HOURS Performed at [facility], [address] [place], [place] [address]    Report Status PENDING   Glucose, capillary     Status: Abnormal   Collection Time: 02/13/20 11:33 AM  Result Value Ref Range   Glucose-Capillary 145 (H) 70 - 99 mg/dL  Lactic acid, plasma     Status: Abnormal   Collection Time: 02/13/20  2:37 PM  Result Value Ref Range   Lactic Acid, Venous 2.0 (HH) 0.5 - 1.9 mmol/L  Troponin I (High Sensitivity)     Status: Abnormal   Collection Time: 02/13/20  8:43 PM  Result Value Ref Range   Troponin I (High Sensitivity) 23 (H) <18 ng/L  Basic metabolic panel     Status: Abnormal   Collection Time: 02/13/20  8:43 PM  Result Value Ref Range   Sodium 139 135 - 145 mmol/L   Potassium 3.9 3.5 - 5.1 mmol/L   Chloride 101 98 - 111 mmol/L   CO2 21 (L) 22 - 32 mmol/L   Glucose, Bld 141 (H) 70 - 99 mg/dL   BUN 49 (H) 8 - 23 mg/dL   Creatinine, Ser 1.70 (H) 0.61 - 1.24 mg/dL   Calcium 9.7 8.9 - 10.3 mg/dL   GFR, Estimated 39 (L) >60 mL/min   Anion gap 17 (H) 5 - 15  Fibrin derivatives D-Dimer ([facility] only)     Status: Abnormal   Collection Time: 02/13/20  8:43 PM  Result Value Ref Range   Fibrin derivatives D-dimer ([facility]) 4,052.75 (H) 0.00 -  499.00 ng/mL (FEU)  Glucose, capillary     Status: Abnormal   Collection Time: 02/14/20  1:19 AM  Result Value Ref Range   Glucose-Capillary 136 (H) 70 - 99 mg/dL  Basic metabolic panel     Status: Abnormal   Collection Time: 02/14/20  4:57 AM  Result Value Ref Range   Sodium 136 135 - 145 mmol/L   Potassium 4.1 3.5 - 5.1 mmol/L   Chloride 104 98 - 111 mmol/L   CO2 19 (L) 22 - 32 mmol/L   Glucose, Bld 167 (H) 70 - 99 mg/dL   BUN 48 (H) 8 - 23 mg/dL   Creatinine, Ser 1.73 (H) 0.61 - 1.24 mg/dL   Calcium 9.6 8.9 - 10.3 mg/dL   GFR, Estimated 38 (L) >60 mL/min   Anion gap 13 5 - 15  CBC     Status: Abnormal   Collection Time: 02/14/20  4:57 AM  Result Value Ref Range   WBC 10.4 4.0 - 10.5 K/uL   RBC 3.62 (L) 4.22 - 5.81 MIL/uL   Hemoglobin 10.8 (L) 13.0 - 17.0 g/dL   HCT 32.2 (L) 39 - 52 %   MCV 89.0 80.0 - 100.0 fL   MCH 29.8 26.0 - 34.0 pg  MCHC 33.5 30.0 - 36.0 g/dL   RDW 13.7 11.5 - 15.5 %   Platelets 275 150 - 400 K/uL   nRBC 0.0 0.0 - 0.2 %  TSH     Status: None   Collection Time: 02/14/20  4:57 AM  Result Value Ref Range   TSH 2.051 0.350 - 4.500 uIU/mL  Troponin I (High Sensitivity)     Status: None   Collection Time: 02/14/20  4:57 AM  Result Value Ref Range   Troponin I (High Sensitivity) 17 <18 ng/L  Glucose, capillary     Status: Abnormal   Collection Time: 02/14/20  8:51 AM  Result Value Ref Range   Glucose-Capillary 138 (H) 70 - 99 mg/dL   CT ANGIO CHEST PE W OR WO CONTRAST  Result Date: 02/13/2020 CLINICAL DATA:  Atrial fibrillation, elevated D-dimer, prostate cancer EXAM: CT ANGIOGRAPHY CHEST WITH CONTRAST TECHNIQUE: Multidetector CT imaging of the chest was performed using the standard protocol during bolus administration of intravenous contrast. Multiplanar CT image reconstructions and MIPs were obtained to evaluate the vascular anatomy. CONTRAST:  76mL OMNIPAQUE IOHEXOL 350 MG/ML SOLN COMPARISON:  None. FINDINGS: Cardiovascular: There is adequate  opacification of the pulmonary arterial tree. There are multiple branching central intraluminal filling defects identified within the right upper and right lower lobar pulmonary arteries as well as multiple segmental pulmonary arteries of the left lower lobe in keeping with acute pulmonary embolism. The central pulmonary arteries are enlarged in keeping with changes of pulmonary arterial hypertension. However, there is no CT evidence of right heart strain with the right to left ventricular ratio within normal limits. Moderate coronary artery calcification. Cardiac size is mildly enlarged. Moderate calcification of the mitral valve annulus. No pericardial effusion. The thoracic aorta is of normal caliber. Mild atherosclerotic calcification is seen within the aortic arch and descending thoracic aorta. Mediastinum/Nodes: There is pathologic bilateral hilar adenopathy. Index lymph node within the right hilum measures 16 mm x 25 mm at axial image # 42/4. Thyroid unremarkable. Esophagus unremarkable. Lungs/Pleura: Multiple bilateral pulmonary masses are identified most in keeping with pulmonary metastatic disease in this patient with a known history of prostate cancer. Index lesion within the right lower lobe measures 3.5 x 5.2 cm at axial image # 62/6. At least 50 nodules are seen scattered throughout the lungs bilaterally. No pneumothorax or pleural effusion. The central airways are widely patent. Upper Abdomen: Unremarkable Musculoskeletal: Multiple sclerotic metastases are identified within the visualized axial skeleton involving the scapula bilaterally, the sternum, the thoracic spine, and multiple ribs bilaterally. No pathologic fracture. Review of the MIP images confirms the above findings. IMPRESSION: Acute pulmonary embolism. Moderate embolic burden. No CT evidence of right heart strain. Moderate coronary artery calcification.  Mild global cardiomegaly. Innumerable bilateral pulmonary masses and nodules as well  as bilateral pathologic hilar adenopathy and widespread sclerotic osseous metastases in keeping with visceral and osseous metastatic disease related to the patient's underlying prostate cancer. Aortic Atherosclerosis (ICD10-I70.0). Electronically Signed   By: Fidela Salisbury MD   On: 02/13/2020 23:27   US Venous Img Lower Bilateral (DVT)  Result Date: 02/14/2020 CLINICAL DATA:  Pulmonary embolus EXAM: LEFT LOWER EXTREMITY VENOUS DOPPLER ULTRASOUND TECHNIQUE: Gray-scale sonography with graded compression, as well as color Doppler and duplex ultrasound were performed to evaluate the lower extremity deep venous systems from the level of the common femoral vein and including the common femoral, femoral, profunda femoral, popliteal and calf veins including the posterior tibial, peroneal and gastrocnemius veins when visible. The  superficial great saphenous vein was also interrogated. Spectral Doppler was utilized to evaluate flow at rest and with distal augmentation maneuvers in the common femoral, femoral and popliteal veins. COMPARISON:  None. FINDINGS: Contralateral Common Femoral Vein: Respiratory phasicity is normal and symmetric with the symptomatic side. No evidence of thrombus. Normal compressibility. Common Femoral Vein: No evidence of thrombus. Normal compressibility, respiratory phasicity and response to augmentation. Saphenofemoral Junction: No evidence of thrombus. Normal compressibility and flow on color Doppler imaging. Profunda Femoral Vein: No evidence of thrombus. Normal compressibility and flow on color Doppler imaging. Femoral Vein: No evidence of thrombus. Normal compressibility, respiratory phasicity and response to augmentation. Popliteal Vein: No evidence of thrombus. Normal compressibility, respiratory phasicity and response to augmentation. Calf Veins: There is nonocclusive thrombus within the left peroneal veins. Superficial Great Saphenous Vein: No evidence of thrombus. Normal  compressibility. Venous Reflux:  None. Other Findings:  None. IMPRESSION: Nonocclusive thrombus within the left peroneal veins. Electronically Signed   By: Ulyses Jarred M.D.   On: 02/14/2020 01:36   CT Renal Stone Study  Result Date: 02/13/2020 CLINICAL DATA:  Prostate cancer. Pink colored urine. Hypotension. Flank pain. EXAM: CT ABDOMEN AND PELVIS WITHOUT CONTRAST TECHNIQUE: Multidetector CT imaging of the abdomen and pelvis was performed following the standard protocol without IV contrast. COMPARISON:  12/23/2017 CT abdomen/pelvis. FINDINGS: Lower chest: Numerous (greater than 20) solid pulmonary nodules and masses at both lung bases, largest 5.2 cm in the basilar right lower lobe (series 4/image 27) and 5.5 cm in the basilar left lower lobe (series 4/image 27), all new. Atherosclerosis. Hepatobiliary: Multiple (at least 5) hypodense liver masses scattered throughout the liver, largest 3.8 cm in the segment 6 right liver lobe (series 2/image 36) and 2.0 cm in the superior right liver (series 2/image 22), all new. Cholelithiasis. No gallbladder wall thickening or pericholecystic fluid. No biliary ductal dilatation. Pancreas: Normal, with no mass or duct dilation. Spleen: Normal size. No mass. Adrenals/Urinary Tract: Normal right adrenal. Left adrenal 1.5 cm nodule with density 17 HU, stable, most compatible with an adenoma. Moderate bilateral hydroureteronephrosis to the level of the ureterovesical junctions bilaterally. No renal stones. No ureteral stones. Simple 3.7 cm posterior lower left renal cyst. No additional contour deforming renal lesions. Multiple layering stones in distended bladder, largest 19 mm. Chronic mild diffuse bladder wall thickening is unchanged. Foley catheter in place with balloon in the dependent bladder. Stomach/Bowel: Normal non-distended stomach. Normal caliber small bowel with no small bowel wall thickening. Normal appendix. Normal large bowel with no diverticulosis, large bowel  wall thickening or pericolonic fat stranding. Vascular/Lymphatic: Atherosclerotic nonaneurysmal abdominal aorta. No pathologically enlarged lymph nodes in the abdomen or pelvis. Reproductive: Newly mildly enlarged, irregular and poorly marginated prostate, directly contiguous with the bladder trigone. Fiducial markers noted in the prostate as before. Other: No pneumoperitoneum, ascites or focal fluid collection. Solid subcutaneous 2.9 cm lateral right chest wall mass (series 2/image 29), new. Similar solid subcutaneous 4.4 cm left gluteal mass (series 2/image 63). Small fat containing umbilical hernia is stable. Musculoskeletal: Numerous new sclerotic osseous lesions throughout the visualized skeleton including multiple lower ribs bilaterally, multiple thoracolumbar vertebrae largest at T11 and throughout the bilateral pelvic girdle, most prominent in the left iliac crest (series 2/image 60). Marked lumbar spondylosis. IMPRESSION: 1. Numerous new solid pulmonary nodules and masses at both lung bases, largest 5.2 cm in the basilar right lower lobe, compatible with pulmonary metastases. 2. New widespread sclerotic osseous metastases throughout the visualized skeleton as detailed. 3. Multiple  new hypodense liver masses compatible with liver metastases. 4. Newly mildly enlarged, irregular and poorly marginated prostate, directly contiguous with the bladder trigone, suggestive of recurrent locally advanced prostate cancer. 5. Moderate bilateral hydroureteronephrosis to the level of the UVJ bilaterally, probably due to bladder outlet obstruction despite the well-positioned Foley catheter in the bladder. 6. Multiple layering bladder stones in the distended bladder. 7. Cholelithiasis. 8. Stable left adrenal adenoma. 9. Aortic Atherosclerosis (ICD10-I70.0). Electronically Signed   By: Ilona Sorrel M.D.   On: 02/13/2020 10:06     ASSESSMENT AND PLAN: Patient presenting to the emergency room with hematuria and  lightheadedness found to have a UTI and pulmonary embolus who then developed atrial fibrillation with rapid ventricular rate.  Patient initially started on Cardizem drip.  Patient converted to normal sinus rhythm overnight and therefore we will plan to discontinue Cardizem infusion and continue metoprolol. Atrial fibrillation most likely induced by PE due to increase in pulmonary vascular resistance.  Continue heparin infusion for pulmonary embolus and we will start the patient on amiodarone 200 mg daily to maintain sinus rhythm.  Awaiting echocardiogram to valuate HFpEF status.  We will continue to follow.Agree.  Adaline Sill NP-C

## 2020-02-14 NOTE — Progress Notes (Signed)
OT Cancellation Note  Patient Details Name: Timothy Maddox MRN: [id number] DOB: Feb 27, 1946   Cancelled Treatment:    Reason Eval/Treat Not Completed: Patient not medically ready. Chart reviewed. Pt imaging positive for acute PE. Anti-coagulation initiated on 10/12 at 2:54am. Per therapy protocols, will hold 48 hours prior to initiation of OT services.   Jeni Salles, MPH, MS, OTR/L ascom [phone] 02/14/20, 8:08 AM

## 2020-02-14 NOTE — Progress Notes (Signed)
ANTICOAGULATION CONSULT NOTE - Follow up [place] for Heparin Indication: pulmonary embolus  Allergies  Allergen Reactions  . Poison Oak Extract Rash  . Poison Ivy Extract Rash    Patient Measurements: Height: 6\' 1"  (185.4 cm) Weight: 122.5 kg (270 lb) IBW/kg (Calculated) : 79.9 HEPARIN DW (KG): 106.7  Vital Signs: BP: 132/71 (10/12 0901) Pulse Rate: 70 (10/12 0901)  Labs: Recent Labs    02/12/20 1758 02/12/20 1758 02/13/20 0817 02/13/20 1022 02/13/20 2043 02/14/20 0457 02/14/20 1214  HGB 12.0*   < > 12.3*  --   --  10.8*  --   HCT 35.8*  --  37.9*  --   --  32.2*  --   PLT 287  --  226  --   --  275  --   APTT  --   --   --  38*  --   --   --   LABPROT  --   --   --  15.0  --   --   --   INR  --   --   --  1.2  --   --   --   HEPARINUNFRC  --   --   --   --   --   --  0.32  CREATININE 2.19*   < > 1.85*  --  1.70* 1.73*  --   TROPONINIHS  --   --   --   --  23* 17 14   < > = values in this interval not displayed.    Estimated Creatinine Clearance: 52.1 mL/min (A) (by C-G formula based on SCr of 1.73 mg/dL (H)).   Assessment: 74 yo male with pulmonary embolism.  Baseline labs noted.  No anticoagulants PTA.  Heparin for PE.  Goal of Therapy:  Heparin level 0.3-0.7 units/ml Monitor platelets by anticoagulation protocol: Yes   Plan:  10/12 at 1214  Heparin level 0.32, therapeutic x1. Continue heparin drip at current rate (= 1700 units/hr). Recheck HL in 8h. CBC in AM.   Pharmacy will continue to follow.   Rayna Sexton L 02/14/2020,1:19 PM

## 2020-02-14 NOTE — Progress Notes (Addendum)
PROGRESS NOTE    Timothy Maddox  GUY:[id number] DOB: Sep 07, 1945 DOA: 02/13/2020 PCP: [name], MD   Brief Narrative: Taken from H&P Timothy Maddox is a 74 y.o. 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  Pt has a h/o prostate CA, urethral stricture. He is s/p of recent cystoscopy and foley placement by Dr. [name] on 02/07/20. On arrival he was hypotensive which improved with IV fluids, UA looks infected, COVID-19 negative, worsening renal function.  CT renal stone study was concerning for multiple mets involving lung, osseous mets and liver.  Moderate bilateral hydronephrosis to the level of UVJ bilaterally and multiple layering bladder stones surrounding the Foley catheter tip. CTA was obtained due to elevated D-dimer and came back positive for PE, without any CT evidence of right heart strain, patient was started on IV heparin. Developed new onset A. fib with RVR-started on Cardizem infusion, overnight converted back to sinus rhythm and Cardizem infusion was discontinued today.  Cardiology was also consulted.  Subjective: Patient continued to have some bladder spasms and pain surrounding the Foley catheter. Later messaged received from nurse that he pulled his catheter out in order to try to move to commode himself, there was some bleeding from meatus most likely secondary to urethral injury.  Urology was informed.  Assessment & Plan:   Principal Problem:   Complicated UTI (urinary tract infection) Active Problems:   Stroke ([place])   Seizures ([place])   Prostate cancer (HCC)   Hypotension   Hypertension   Type II diabetes mellitus with renal manifestations (HCC)   Acute renal failure superimposed on stage 3a chronic kidney disease (HCC)   Chronic diastolic CHF (congestive heart failure) (HCC)   Atrial fibrillation with RVR  (HCC)  Complicated UTI/hydronephrosis/multiple bladder stones.  Recent cystoscopy.  Blood cultures negative so far.  Urinary cultures pending. -Continue with ceftriaxone- -follow-up urine culture results. -Urology is following-appreciate their help.  Urethral injury.  After pulling Foley catheter. -Informed by urology. -Foley if needs to be replaced should be done by urology.  Prostate cancer.  Patient was waiting for PET scan for concern of recurrence of bladder cancer.  CT renal stone study and CTA are concerning for multiple mets including lungs, liver and bones. -Patient follows with Dr. [name].  -continue [place].  New onset A. fib with RVR.  Most likely secondary to PE.  Cardiology was consulted and he was started on Cardizem infusion.  Converted back to sinus rhythm this morning.  Cardizem infusion discontinued and continued to maintain sinus rhythm with heart rate in 70s.  TSH within normal limit -Continue with heparin infusion. -Continue with metoprolol -Continue to monitor  Pulmonary embolism.  CTA was done due to elevated D-dimer above 4000.  It was positive for PE.  Patient is high risk secondary to extensive malignancy. Started on heparin infusion. -Continue with heparin infusion-can be switched to Xarelto or Eliquis if no emergent procedure by urology.  Anemia.  Hemoglobin dropped to 10.8 this morning from 12.3.  Most likely secondary to hematuria.  Urine appears clear when seen today. Anemia panel consistent with mild iron deficiency along with anemia of chronic disease.  Pending B12 levels. -Continue to monitor. -Transfuse if below 7  History of CVA.  No acute concern. -Continue Lipitor  History of seizure disorder. -Continue home dose of Keppra. -Continue with seizure precautions -As needed  Ativan  Hypertension.  Blood pressure within goal today. -Continue metoprolol. -Holding home dose of spironolactone and Hyzaar. -Continue IV hydralazine as needed  AKI with  CKD stage IIIa.  Baseline creatinine around 1.2-1.6.  Stable today around 1.73.  Patient appears dehydrated and hypotensive on admission and he was continue to take Hyzaar and spironolactone. -Continue to hold Cozaar and spironolactone. -Continue to monitor. -Avoid nephrotoxins.  Chronic diastolic heart failure.  Prior echo done in 2017 with normal EF and grade 1 diastolic dysfunction.  Patient appears euvolemic. -Continue to monitor. -Holding home dose of spironolactone. -Continue metoprolol  Type II diabetes mellitus with renal manifestations [facility]): Recent A1c 6.3, well controlled.  Patient taking Metformin at home. -Continue with SSI  Objective: Vitals:   02/14/20 0121 02/14/20 0355 02/14/20 0606 02/14/20 0901  BP: 128/72 (!) 155/79 132/74 132/71  Pulse: 76 66 65 70  Resp:  20 12   Temp:      TempSrc:      SpO2:  94% 94%   Weight:      Height:        Intake/Output Summary (Last 24 hours) at 02/14/2020 1358 Last data filed at 02/14/2020 1105 Gross per 24 hour  Intake 196.66 ml  Output 1300 ml  Net -1103.34 ml   Filed Weights   02/12/20 1732  Weight: 122.5 kg    Examination:  General exam: Appears calm and comfortable  Respiratory system: Clear to auscultation. Respiratory effort normal. Cardiovascular system: S1 & S2 heard, RRR.  Gastrointestinal system: Soft, nontender, nondistended, bowel sounds positive. Central nervous system: Alert and oriented. No focal neurological deficits. Extremities: No edema, no cyanosis, pulses intact and symmetrical. Psychiatry: Judgement and insight appear normal.    DVT prophylaxis: Heparin infusion Code Status: Full Family Communication: Discussed with patient and wife. Disposition Plan:  Status is: Inpatient  Remains inpatient appropriate because:Inpatient level of care appropriate due to severity of illness   Dispo: The patient is from: Home              Anticipated d/c is to: Home              Anticipated d/c date is:  2 days              Patient currently is not medically stable to d/c.   Consultants:   Urology  Cardiology  Procedures:  Antimicrobials:  Ceftriaxone  Data Reviewed: I have personally reviewed following labs and imaging studies  CBC: Recent Labs  Lab 02/12/20 1758 02/13/20 0817 02/14/20 0457  WBC 10.1 9.7 10.4  HGB 12.0* 12.3* 10.8*  HCT 35.8* 37.9* 32.2*  MCV 89.3 92.4 89.0  PLT 287 226 951   Basic Metabolic Panel: Recent Labs  Lab 02/12/20 1758 02/13/20 0817 02/13/20 2043 02/14/20 0457  NA 137 135 139 136  K 4.5 5.4* 3.9 4.1  CL 102 104 101 104  CO2 21* 15* 21* 19*  GLUCOSE 163* 143* 141* 167*  BUN 62* 56* 49* 48*  CREATININE 2.19* 1.85* 1.70* 1.73*  CALCIUM 9.9 9.7 9.7 9.6   GFR: Estimated Creatinine Clearance: 52.1 mL/min (A) (by C-G formula based on SCr of 1.73 mg/dL (H)). Liver Function Tests: No results for input(s): AST, ALT, ALKPHOS, BILITOT, PROT, ALBUMIN in the last 168 hours. No results for input(s): LIPASE, AMYLASE in the last 168 hours. No results for input(s): AMMONIA in the last 168 hours. Coagulation Profile: Recent Labs  Lab 02/13/20 1022  INR 1.2   Cardiac Enzymes: No results for  input(s): CKTOTAL, CKMB, CKMBINDEX, TROPONINI in the last 168 hours. BNP (last 3 results) No results for input(s): PROBNP in the last 8760 hours. HbA1C: No results for input(s): HGBA1C in the last 72 hours. CBG: Recent Labs  Lab 02/13/20 1133 02/14/20 0119 02/14/20 0851 02/14/20 1353  GLUCAP 145* 136* 138* 141*   Lipid Profile: No results for input(s): CHOL, HDL, LDLCALC, TRIG, CHOLHDL, LDLDIRECT in the last 72 hours. Thyroid Function Tests: Recent Labs    02/14/20 0457  TSH 2.051   Anemia Panel: Recent Labs    02/14/20 1214  FOLATE 7.4  FERRITIN 827*  TIBC 167*  IRON 29*  RETICCTPCT 1.8   Sepsis Labs: Recent Labs  Lab 02/13/20 1022 02/13/20 1437  PROCALCITON 0.42  --   LATICACIDVEN 3.4* 2.0*    Recent Results (from the past  240 hour(s))  Urine culture     Status: Abnormal (Preliminary result)   Collection Time: 02/13/20  8:46 AM   Specimen: Urine, Catheterized  Result Value Ref Range Status   Specimen Description   Final    URINE, CATHETERIZED Performed at [facility], [address], [place], [place] [address]    Special Requests   Final    NONE Performed at [facility], [place], [place] [address]    Culture 50,000 COLONIES/mL STAPHYLOCOCCUS EPIDERMIDIS (A)  Final   Report Status PENDING  Incomplete  Resp Panel by RT PCR (RSV, Flu A&B, Covid) - Nasopharyngeal Swab     Status: None   Collection Time: 02/13/20  9:59 AM   Specimen: Nasopharyngeal Swab  Result Value Ref Range Status   SARS Coronavirus 2 by RT PCR NEGATIVE NEGATIVE Final    Comment: (NOTE) SARS-CoV-2 target nucleic acids are NOT DETECTED.  The SARS-CoV-2 RNA is generally detectable in upper respiratoy specimens during the acute phase of infection. The lowest concentration of SARS-CoV-2 viral copies this assay can detect is 131 copies/mL. A negative result does not preclude SARS-Cov-2 infection and should not be used as the sole basis for treatment or other patient management decisions. A negative result may occur with  improper specimen collection/handling, submission of specimen other than nasopharyngeal swab, presence of viral mutation(s) within the areas targeted by this assay, and inadequate number of viral copies (<131 copies/mL). A negative result must be combined with clinical observations, patient history, and epidemiological information. The expected result is Negative.  Fact Sheet for Patients:  PinkCheek.be  Fact Sheet for Healthcare Providers:  GravelBags.it  This test is no t yet approved or cleared by the [place] FDA and  has been authorized for detection and/or diagnosis of SARS-CoV-2 by FDA under an Emergency Use  Authorization (EUA). This EUA will remain  in effect (meaning this test can be used) for the duration of the COVID-19 declaration under Section 564(b)(1) of the Act, 21 U.S.C. section 360bbb-3(b)(1), unless the authorization is terminated or revoked sooner.     Influenza A by PCR NEGATIVE NEGATIVE Final   Influenza B by PCR NEGATIVE NEGATIVE Final    Comment: (NOTE) The Xpert Xpress SARS-CoV-2/FLU/RSV assay is intended as an aid in  the diagnosis of influenza from Nasopharyngeal swab specimens and  should not be used as a sole basis for treatment. Nasal washings and  aspirates are unacceptable for Xpert Xpress SARS-CoV-2/FLU/RSV  testing.  Fact Sheet for Patients: PinkCheek.be  Fact Sheet for Healthcare Providers: GravelBags.it  This test is not yet approved or cleared by the [place] FDA and  has  been authorized for detection and/or diagnosis of SARS-CoV-2 by  FDA under an Emergency Use Authorization (EUA). This EUA will remain  in effect (meaning this test can be used) for the duration of the  Covid-19 declaration under Section 564(b)(1) of the Act, 21  U.S.C. section 360bbb-3(b)(1), unless the authorization is  terminated or revoked.    Respiratory Syncytial Virus by PCR NEGATIVE NEGATIVE Final    Comment: (NOTE) Fact Sheet for Patients: PinkCheek.be  Fact Sheet for Healthcare Providers: GravelBags.it  This test is not yet approved or cleared by the [place] FDA and  has been authorized for detection and/or diagnosis of SARS-CoV-2 by  FDA under an Emergency Use Authorization (EUA). This EUA will remain  in effect (meaning this test can be used) for the duration of the  COVID-19 declaration under Section 564(b)(1) of the Act, 21 U.S.C.  section 360bbb-3(b)(1), unless the authorization is terminated or  revoked. Performed at [facility],  [address] [place], [place] [address]   Culture, blood (Routine X 2) w Reflex to ID Panel     Status: None (Preliminary result)   Collection Time: 02/13/20 10:22 AM   Specimen: BLOOD  Result Value Ref Range Status   Specimen Description BLOOD LEFT HAND  Final   Special Requests   Final    BOTTLES DRAWN AEROBIC AND ANAEROBIC Blood Culture results may not be optimal due to an inadequate volume of blood received in culture bottles   Culture   Final    NO GROWTH < 24 HOURS Performed at [facility], [address] [place] [address], [place], [place] [address]    Report Status PENDING  Incomplete  Culture, blood (Routine X 2) w Reflex to ID Panel     Status: None (Preliminary result)   Collection Time: 02/13/20 10:30 AM   Specimen: BLOOD  Result Value Ref Range Status   Specimen Description BLOOD LEFT ARM  Final   Special Requests   Final    BOTTLES DRAWN AEROBIC AND ANAEROBIC Blood Culture adequate volume   Culture   Final    NO GROWTH < 24 HOURS Performed at [facility], [address], [place], [place] [address]    Report Status PENDING  Incomplete     Radiology Studies: CT ANGIO CHEST PE W OR WO CONTRAST  Result Date: 02/13/2020 CLINICAL DATA:  Atrial fibrillation, elevated D-dimer, prostate cancer EXAM: CT ANGIOGRAPHY CHEST WITH CONTRAST TECHNIQUE: Multidetector CT imaging of the chest was performed using the standard protocol during bolus administration of intravenous contrast. Multiplanar CT image reconstructions and MIPs were obtained to evaluate the vascular anatomy. CONTRAST:  20mL OMNIPAQUE IOHEXOL 350 MG/ML SOLN COMPARISON:  None. FINDINGS: Cardiovascular: There is adequate opacification of the pulmonary arterial tree. There are multiple branching central intraluminal filling defects identified within the right upper and right lower lobar pulmonary arteries as well as multiple segmental pulmonary arteries of the left lower lobe in keeping with acute pulmonary  embolism. The central pulmonary arteries are enlarged in keeping with changes of pulmonary arterial hypertension. However, there is no CT evidence of right heart strain with the right to left ventricular ratio within normal limits. Moderate coronary artery calcification. Cardiac size is mildly enlarged. Moderate calcification of the mitral valve annulus. No pericardial effusion. The thoracic aorta is of normal caliber. Mild atherosclerotic calcification is seen within the aortic arch and descending thoracic aorta. Mediastinum/Nodes: There is pathologic bilateral hilar adenopathy. Index lymph node within the right hilum measures 16 mm x 25  mm at axial image # 42/4. Thyroid unremarkable. Esophagus unremarkable. Lungs/Pleura: Multiple bilateral pulmonary masses are identified most in keeping with pulmonary metastatic disease in this patient with a known history of prostate cancer. Index lesion within the right lower lobe measures 3.5 x 5.2 cm at axial image # 62/6. At least 50 nodules are seen scattered throughout the lungs bilaterally. No pneumothorax or pleural effusion. The central airways are widely patent. Upper Abdomen: Unremarkable Musculoskeletal: Multiple sclerotic metastases are identified within the visualized axial skeleton involving the scapula bilaterally, the sternum, the thoracic spine, and multiple ribs bilaterally. No pathologic fracture. Review of the MIP images confirms the above findings. IMPRESSION: Acute pulmonary embolism. Moderate embolic burden. No CT evidence of right heart strain. Moderate coronary artery calcification.  Mild global cardiomegaly. Innumerable bilateral pulmonary masses and nodules as well as bilateral pathologic hilar adenopathy and widespread sclerotic osseous metastases in keeping with visceral and osseous metastatic disease related to the patient's underlying prostate cancer. Aortic Atherosclerosis (ICD10-I70.0). Electronically Signed   By: Fidela Salisbury MD   On:  02/13/2020 23:27   US Venous Img Lower Bilateral (DVT)  Result Date: 02/14/2020 CLINICAL DATA:  Pulmonary embolus EXAM: LEFT LOWER EXTREMITY VENOUS DOPPLER ULTRASOUND TECHNIQUE: Gray-scale sonography with graded compression, as well as color Doppler and duplex ultrasound were performed to evaluate the lower extremity deep venous systems from the level of the common femoral vein and including the common femoral, femoral, profunda femoral, popliteal and calf veins including the posterior tibial, peroneal and gastrocnemius veins when visible. The superficial great saphenous vein was also interrogated. Spectral Doppler was utilized to evaluate flow at rest and with distal augmentation maneuvers in the common femoral, femoral and popliteal veins. COMPARISON:  None. FINDINGS: Contralateral Common Femoral Vein: Respiratory phasicity is normal and symmetric with the symptomatic side. No evidence of thrombus. Normal compressibility. Common Femoral Vein: No evidence of thrombus. Normal compressibility, respiratory phasicity and response to augmentation. Saphenofemoral Junction: No evidence of thrombus. Normal compressibility and flow on color Doppler imaging. Profunda Femoral Vein: No evidence of thrombus. Normal compressibility and flow on color Doppler imaging. Femoral Vein: No evidence of thrombus. Normal compressibility, respiratory phasicity and response to augmentation. Popliteal Vein: No evidence of thrombus. Normal compressibility, respiratory phasicity and response to augmentation. Calf Veins: There is nonocclusive thrombus within the left peroneal veins. Superficial Great Saphenous Vein: No evidence of thrombus. Normal compressibility. Venous Reflux:  None. Other Findings:  None. IMPRESSION: Nonocclusive thrombus within the left peroneal veins. Electronically Signed   By: Ulyses Jarred M.D.   On: 02/14/2020 01:36   CT Renal Stone Study  Result Date: 02/13/2020 CLINICAL DATA:  Prostate cancer. Pink colored  urine. Hypotension. Flank pain. EXAM: CT ABDOMEN AND PELVIS WITHOUT CONTRAST TECHNIQUE: Multidetector CT imaging of the abdomen and pelvis was performed following the standard protocol without IV contrast. COMPARISON:  12/23/2017 CT abdomen/pelvis. FINDINGS: Lower chest: Numerous (greater than 20) solid pulmonary nodules and masses at both lung bases, largest 5.2 cm in the basilar right lower lobe (series 4/image 27) and 5.5 cm in the basilar left lower lobe (series 4/image 27), all new. Atherosclerosis. Hepatobiliary: Multiple (at least 5) hypodense liver masses scattered throughout the liver, largest 3.8 cm in the segment 6 right liver lobe (series 2/image 36) and 2.0 cm in the superior right liver (series 2/image 22), all new. Cholelithiasis. No gallbladder wall thickening or pericholecystic fluid. No biliary ductal dilatation. Pancreas: Normal, with no mass or duct dilation. Spleen: Normal size. No mass. Adrenals/Urinary Tract:  Normal right adrenal. Left adrenal 1.5 cm nodule with density 17 HU, stable, most compatible with an adenoma. Moderate bilateral hydroureteronephrosis to the level of the ureterovesical junctions bilaterally. No renal stones. No ureteral stones. Simple 3.7 cm posterior lower left renal cyst. No additional contour deforming renal lesions. Multiple layering stones in distended bladder, largest 19 mm. Chronic mild diffuse bladder wall thickening is unchanged. Foley catheter in place with balloon in the dependent bladder. Stomach/Bowel: Normal non-distended stomach. Normal caliber small bowel with no small bowel wall thickening. Normal appendix. Normal large bowel with no diverticulosis, large bowel wall thickening or pericolonic fat stranding. Vascular/Lymphatic: Atherosclerotic nonaneurysmal abdominal aorta. No pathologically enlarged lymph nodes in the abdomen or pelvis. Reproductive: Newly mildly enlarged, irregular and poorly marginated prostate, directly contiguous with the bladder  trigone. Fiducial markers noted in the prostate as before. Other: No pneumoperitoneum, ascites or focal fluid collection. Solid subcutaneous 2.9 cm lateral right chest wall mass (series 2/image 29), new. Similar solid subcutaneous 4.4 cm left gluteal mass (series 2/image 63). Small fat containing umbilical hernia is stable. Musculoskeletal: Numerous new sclerotic osseous lesions throughout the visualized skeleton including multiple lower ribs bilaterally, multiple thoracolumbar vertebrae largest at T11 and throughout the bilateral pelvic girdle, most prominent in the left iliac crest (series 2/image 60). Marked lumbar spondylosis. IMPRESSION: 1. Numerous new solid pulmonary nodules and masses at both lung bases, largest 5.2 cm in the basilar right lower lobe, compatible with pulmonary metastases. 2. New widespread sclerotic osseous metastases throughout the visualized skeleton as detailed. 3. Multiple new hypodense liver masses compatible with liver metastases. 4. Newly mildly enlarged, irregular and poorly marginated prostate, directly contiguous with the bladder trigone, suggestive of recurrent locally advanced prostate cancer. 5. Moderate bilateral hydroureteronephrosis to the level of the UVJ bilaterally, probably due to bladder outlet obstruction despite the well-positioned Foley catheter in the bladder. 6. Multiple layering bladder stones in the distended bladder. 7. Cholelithiasis. 8. Stable left adrenal adenoma. 9. Aortic Atherosclerosis (ICD10-I70.0). Electronically Signed   By: Ilona Sorrel M.D.   On: 02/13/2020 10:06    Scheduled Meds: . amiodarone  200 mg Oral Daily  . insulin aspart  0-5 Units Subcutaneous QHS  . insulin aspart  0-9 Units Subcutaneous TID WC  . levETIRAcetam  500 mg Oral BID  . metoprolol tartrate  50 mg Oral BID  . rosuvastatin  5 mg Oral QHS   Continuous Infusions: . sodium chloride Stopped (02/14/20 1141)  . cefTRIAXone (ROCEPHIN)  IV Stopped (02/14/20 1140)  . heparin  1,700 Units/hr (02/14/20 1140)     LOS: 1 day   Time spent: 35 minutes.  Lorella Nimrod, MD [facility]  If 7PM-7AM, please contact night-coverage [email or web address]  02/14/2020, 1:58 PM   This record has been created using Systems analyst. Errors have been sought and corrected,but may not always be located. Such creation errors do not reflect on the standard of care.

## 2020-02-14 NOTE — ED Notes (Signed)
Entered patient room to find patient on toilet. All IV lines pulled from patient, foley disconnected from foley bag. Assisted patient back to bed. Cleaned of blood, old IV sites dressed. MD notified.

## 2020-02-14 NOTE — Progress Notes (Signed)
ANTICOAGULATION CONSULT NOTE - Initial Consult  Pharmacy Consult for Heparin Indication: pulmonary embolus  Allergies  Allergen Reactions  . Poison Oak Extract Rash  . Poison Ivy Extract Rash    Patient Measurements: Height: 6\' 1"  (185.4 cm) Weight: 122.5 kg (270 lb) IBW/kg (Calculated) : 79.9 HEPARIN DW (KG): 106.7  Vital Signs: BP: 128/72 (10/12 0121) Pulse Rate: 76 (10/12 0121)  Labs: Recent Labs    02/12/20 1758 02/13/20 0817 02/13/20 1022 02/13/20 2043  HGB 12.0* 12.3*  --   --   HCT 35.8* 37.9*  --   --   PLT 287 226  --   --   APTT  --   --  38*  --   LABPROT  --   --  15.0  --   INR  --   --  1.2  --   CREATININE 2.19* 1.85*  --  1.70*  TROPONINIHS  --   --   --  23*    Estimated Creatinine Clearance: 53 mL/min (A) (by C-G formula based on SCr of 1.7 mg/dL (H)).   Medical History: Past Medical History:  Diagnosis Date  . Anxiety   . Arthritis    lower back, shoulders  . AVM (arteriovenous malformation) brain   . Cancer [facility])    prostate- treated with radiation  . CVA (cerebral infarction) 11/2015  . Depression   . Diabetes mellitus without complication ([place])   . Dyspnea    with exertion  . HOH (hard of hearing)   . Hyperlipidemia   . Hypertension   . Pneumonia    1st grade  . Pre-diabetes   . Prediabetes   . Seizures ([place]) 12/2015  . Sleep apnea   . Stroke [facility])    speech impairment, no weakness    Medications:  (Not in a hospital admission)   Assessment: Baseline labs noted.  No anticoagulants PTA.  Heparin for PE.  Goal of Therapy:  Heparin level 0.3-0.7 units/ml Monitor platelets by anticoagulation protocol: Yes   Plan:  Heparin 5000 units bolus x 1 then infusion at 1700 units/hr Check HL ~ 8 hours after Heparin started  Hart Robinsons A 02/14/2020,3:18 AM

## 2020-02-14 NOTE — Progress Notes (Signed)
*  PRELIMINARY RESULTS* Echocardiogram 2D Echocardiogram has been performed.  Timothy Maddox Bodley 02/14/2020, 1:13 PM

## 2020-02-14 NOTE — Progress Notes (Signed)
ANTICOAGULATION CONSULT NOTE - Follow up [place] for Heparin Indication: pulmonary embolus  Allergies  Allergen Reactions  . Poison Oak Extract Rash  . Poison Ivy Extract Rash    Patient Measurements: Height: 6\' 1"  (185.4 cm) Weight: 120.7 kg (266 lb 1.6 oz) IBW/kg (Calculated) : 79.9 HEPARIN DW (KG): 106.1  Vital Signs: Temp: 98.9 F (37.2 C) (10/12 2244) Temp Source: Oral (10/12 2244) BP: 146/82 (10/12 2244) Pulse Rate: 142 (10/12 2244)  Labs: Recent Labs    02/12/20 1758 02/12/20 1758 02/13/20 0817 02/13/20 1022 02/13/20 2043 02/14/20 0457 02/14/20 1214 02/14/20 2156  HGB 12.0*   < > 12.3*  --   --  10.8*  --   --   HCT 35.8*  --  37.9*  --   --  32.2*  --   --   PLT 287  --  226  --   --  275  --   --   APTT  --   --   --  38*  --   --   --   --   LABPROT  --   --   --  15.0  --   --   --   --   INR  --   --   --  1.2  --   --   --   --   HEPARINUNFRC  --   --   --   --   --   --  0.32 0.23*  CREATININE 2.19*   < > 1.85*  --  1.70* 1.73*  --   --   TROPONINIHS  --   --   --   --  23* 17 14  --    < > = values in this interval not displayed.    Estimated Creatinine Clearance: 51.7 mL/min (A) (by C-G formula based on SCr of 1.73 mg/dL (H)).   Assessment: 74 yo male with pulmonary embolism.  Baseline labs noted.  No anticoagulants PTA.  Heparin for PE.  Goal of Therapy:  Heparin level 0.3-0.7 units/ml Monitor platelets by anticoagulation protocol: Yes   Plan:  10/12 at 1214  Heparin level 0.32, therapeutic x1. Continue heparin drip at current rate (= 1700 units/hr). Recheck HL in 8h. CBC in AM.   10/12 @ 2156 HL 0.23, SUBtherapeutic, trended down.  Will increase Heparin to 1850 units/hr and recheck HL in 8 hours.  CBC in am.  Pharmacy will continue to follow.   Hart Robinsons A 02/14/2020,10:45 PM

## 2020-02-15 DIAGNOSIS — N390 Urinary tract infection, site not specified: Secondary | ICD-10-CM | POA: Diagnosis not present

## 2020-02-15 LAB — BASIC METABOLIC PANEL
Anion gap: 13 (ref 5–15)
BUN: 53 mg/dL — ABNORMAL HIGH (ref 8–23)
CO2: 19 mmol/L — ABNORMAL LOW (ref 22–32)
Calcium: 9.4 mg/dL (ref 8.9–10.3)
Chloride: 105 mmol/L (ref 98–111)
Creatinine, Ser: 2.5 mg/dL — ABNORMAL HIGH (ref 0.61–1.24)
GFR, Estimated: 25 mL/min — ABNORMAL LOW (ref >60)
Glucose, Bld: 125 mg/dL — ABNORMAL HIGH (ref 70–99)
Potassium: 4.1 mmol/L (ref 3.5–5.1)
Sodium: 137 mmol/L (ref 135–145)

## 2020-02-15 LAB — CBC
HCT: 32.2 % — ABNORMAL LOW (ref 39.0–52.0)
Hemoglobin: 10.6 g/dL — ABNORMAL LOW (ref 13.0–17.0)
MCH: 30 pg (ref 26.0–34.0)
MCHC: 32.9 g/dL (ref 30.0–36.0)
MCV: 91.2 fL (ref 80.0–100.0)
Platelets: 261 10*3/uL (ref 150–400)
RBC: 3.53 MIL/uL — ABNORMAL LOW (ref 4.22–5.81)
RDW: 14.3 % (ref 11.5–15.5)
WBC: 11.3 10*3/uL — ABNORMAL HIGH (ref 4.0–10.5)
nRBC: 0 % (ref 0.0–0.2)

## 2020-02-15 LAB — GLUCOSE, CAPILLARY
Glucose-Capillary: 119 mg/dL — ABNORMAL HIGH (ref 70–99)
Glucose-Capillary: 145 mg/dL — ABNORMAL HIGH (ref 70–99)
Glucose-Capillary: 130 mg/dL — ABNORMAL HIGH (ref 70–99)
Glucose-Capillary: 163 mg/dL — ABNORMAL HIGH (ref 70–99)

## 2020-02-15 LAB — URINE CULTURE: Culture: 50000 — AB

## 2020-02-15 LAB — HEPARIN LEVEL (UNFRACTIONATED)
Heparin Unfractionated: 0.49 IU/mL (ref 0.30–0.70)
Heparin Unfractionated: 0.45 IU/mL (ref 0.30–0.70)

## 2020-02-15 MED ORDER — AMIODARONE HCL IN DEXTROSE 360-4.14 MG/200ML-% IV SOLN
30.0000 mg/h | INTRAVENOUS | Status: DC
  Administered 2020-02-16: 30 mg/h via INTRAVENOUS
  Filled 2020-02-15: qty 200

## 2020-02-15 MED ORDER — AMIODARONE HCL IN DEXTROSE 360-4.14 MG/200ML-% IV SOLN: 60.0000 mg/h | INTRAVENOUS | Status: DC

## 2020-02-15 MED ORDER — AMIODARONE HCL 200 MG PO TABS
400.0000 mg | ORAL_TABLET | Freq: Two times a day (BID) | ORAL | Status: DC
  Administered 2020-02-15 – 2020-02-16 (×3): 400 mg via ORAL
  Filled 2020-02-15 (×3): qty 2

## 2020-02-15 MED ORDER — AMIODARONE HCL IN DEXTROSE 360-4.14 MG/200ML-% IV SOLN
INTRAVENOUS | Status: AC
  Administered 2020-02-15: 60 mg/h via INTRAVENOUS
  Filled 2020-02-15: qty 200

## 2020-02-15 NOTE — Progress Notes (Signed)
ANTICOAGULATION CONSULT NOTE - Follow up [place] for Heparin Indication: pulmonary embolus  Allergies  Allergen Reactions  . Poison Oak Extract Rash  . Poison Ivy Extract Rash    Patient Measurements: Height: 6\' 1"  (185.4 cm) Weight: 119.5 kg (263 lb 6.4 oz) IBW/kg (Calculated) : 79.9 HEPARIN DW (KG): 106.1  Vital Signs: Temp: 98.4 F (36.9 C) (10/13 1136) Temp Source: Oral (10/13 1136) BP: 118/58 (10/13 1136) Pulse Rate: 65 (10/13 1136)  Labs: Recent Labs    02/13/20 0817 02/13/20 0817 02/13/20 1022 02/13/20 2043 02/14/20 0457 02/14/20 1214 02/14/20 1214 02/14/20 2156 02/15/20 0709 02/15/20 1510  HGB 12.3*   < >  --   --  10.8*  --   --   --  10.6*  --   HCT 37.9*  --   --   --  32.2*  --   --   --  32.2*  --   PLT 226  --   --   --  275  --   --   --  261  --   APTT  --   --  38*  --   --   --   --   --   --   --   LABPROT  --   --  15.0  --   --   --   --   --   --   --   INR  --   --  1.2  --   --   --   --   --   --   --   HEPARINUNFRC  --   --   --   --   --  0.32   < > 0.23* 0.49 0.45  CREATININE 1.85*   < >  --  1.70* 1.73*  --   --   --  2.50*  --   TROPONINIHS  --   --   --  23* 17 14  --   --   --   --    < > = values in this interval not displayed.    Estimated Creatinine Clearance: 35.6 mL/min (A) (by C-G formula based on SCr of 2.5 mg/dL (H)).   Assessment: 74 yo male who presented to ED with hematuria and lightheadedness. Found to have UTI and pulmonary embolism and later developed Afib with RVR. Baseline labs noted. No anticoagulants PTA.  Pharmacy has been consulted for heparin dosing and monitoring for PE.  Patient has foley in placed and urine draining noted to be cherry red without clots  Hgb 12>12.3>10.8>10.6  10/12 1214 HL 0.32; therapeutic 10/12 2156 HL 0.23; subtherapeutic - rate increased to 1850 units/hr 10/13 0709 HL 0.49; therapeutic 10/13 1510 HL 0.45; therapeutic x2  Goal of Therapy:  Heparin level 0.3-0.7  units/ml Monitor platelets by anticoagulation protocol: Yes   Plan:  HL therapeutic. Continue heparin drip at current rate @ 1850 units/hr.  Monitor HL and CBC with AM labs   Cardiology plan to transition to oral anticoagulation (Xarelto or Eliquis) once cleared by urology  Sherilyn Banker, PharmD Pharmacy Resident  02/15/2020 3:52 PM

## 2020-02-15 NOTE — Progress Notes (Signed)
Provider notified.  Pt HR elevated. 110-140 Afib Pt denies any chest pain or shortness of breath. Denies palpations  New orders received.  Pt updated on plan of care Verbalizes an understanding.  Cal bell remains within reach.  Will continue to closely monitor.

## 2020-02-15 NOTE — Progress Notes (Signed)
PT Cancellation Note  Patient Details Name: Timothy Maddox MRN: [id number] DOB: February 01, 1946   Cancelled Treatment:    Reason Eval/Treat Not Completed: Medical issues which prohibited therapy (Per chart review, patient noted with recently diagnosed PE; anticoagulation initiated 10/12 @0254 .  Per guidelines, patient to complete 48 hours anticoag prior to initiation of exertional activity.  Will hold at this time and re-attempt next date as appropriate.)  Daryn Hicks H. Owens Shark, PT, DPT, NCS 02/15/20, 2:16 PM [phone]

## 2020-02-15 NOTE — Progress Notes (Signed)
   02/15/20 1918  Assess: MEWS Score  Temp 99.2 F (37.3 C)  BP 130/71  Pulse Rate (!) 108  ECG Heart Rate (!) 108  Resp (!) 21  SpO2 93 %  O2 Device Room Air  Assess: MEWS Score  MEWS Temp 0  MEWS Systolic 0  MEWS Pulse 1  MEWS RR 1  MEWS LOC 0  MEWS Score 2  MEWS Score Color Yellow  Assess: if the MEWS score is Yellow or Red  Were vital signs taken at a resting state? Yes  Focused Assessment No change from prior assessment  Early Detection of Sepsis Score *See Row Information* Low  MEWS guidelines implemented *See Row Information* No, vital signs rechecked  Treat  Pain Scale 0-10  Pain Score 0  Patients response to intervention Effective  Document  Patient Outcome Other (Comment);Stabilized after interventions (Will continue to monitor)

## 2020-02-15 NOTE — Progress Notes (Signed)
Telemetry notified that patient will be starting on an Amiodarone gtt.

## 2020-02-15 NOTE — Progress Notes (Signed)
   02/15/20 1844  Assess: MEWS Score  BP 116/83  Pulse Rate (!) 112  SpO2 94 %  O2 Device Room Air  Assess: MEWS Score  MEWS Temp 0  MEWS Systolic 0  MEWS Pulse 2  MEWS RR 0  MEWS LOC 0  MEWS Score 2  MEWS Score Color Yellow  Assess: if the MEWS score is Yellow or Red  Were vital signs taken at a resting state? Yes  Focused Assessment Change from prior assessment (see assessment flowsheet)  Early Detection of Sepsis Score *See Row Information* Low  MEWS guidelines implemented *See Row Information* Yes  Treat  MEWS Interventions Administered prn meds/treatments  Pain Scale 0-10  Pain Score 0  Patients response to intervention Effective  Take Vital Signs  Increase Vital Sign Frequency  Yellow: Q 2hr X 2 then Q 4hr X 2, if remains yellow, continue Q 4hrs  Escalate  MEWS: Escalate Yellow: discuss with charge nurse/RN and consider discussing with provider and RRT  Notify: Charge Nurse/RN  Name of Charge Nurse/RN Notified Janett Billow RN  Date Charge Nurse/RN Notified 02/15/20  Time Charge Nurse/RN Notified 1830  Document  Patient Outcome Other (Comment) (Amio gtt started. Will continue to monitor. )

## 2020-02-15 NOTE — Progress Notes (Signed)
  Chaplain On-Call responded to Order Requisition for support for patient in major life transition.  Chaplain met patient and daughter Timothy Maddox at bedside. Provided supportive listening and presence as patient described his recent history with prostate cancer surgery and subsequent issues with urinary infection.  Chaplain provided spiritual and emotional support, and assured patient of availability of Chaplains for further support as needed.  [place] Bryson Gavia M.Div., [facility]

## 2020-02-15 NOTE — Consult Note (Signed)
MEDICATION RELATED CONSULT NOTE - INITIAL   Pharmacy Consult for Drug interactions with regard to adding amiodarone   Allergies  Allergen Reactions  . Poison Oak Extract Rash  . Poison Ivy Extract Rash    Medical History: Past Medical History:  Diagnosis Date  . Anxiety   . Arthritis    lower back, shoulders  . AVM (arteriovenous malformation) brain   . Cancer [facility])    prostate- treated with radiation  . CVA (cerebral infarction) 11/2015  . Depression   . Diabetes mellitus without complication ([place])   . Dyspnea    with exertion  . HOH (hard of hearing)   . Hyperlipidemia   . Hypertension   . Pneumonia    1st grade  . Pre-diabetes   . Prediabetes   . Seizures ([place]) 12/2015  . Sleep apnea   . Stroke [facility])    speech impairment, no weakness    Assessment: Pharmacy consulted to identifying drug interactions that may result from starting amiodarone. Patient is currently admitted for a pulmonary embolus, UTI w/hematuria, and new onset A.fib w/RVR. Per notes, patient is currently in A.fib w/ HR 110-140.  Current Inpatient drug interactions w/amiodarone (per Micromedex/interaction tool on EPIC):   Major: Ondansetron (PRN)- may cause QTc prolongation and torsades de pointes. EKG on 02/13/2020: QTc 454 ms  Major: Oxycodone- may increase the exposure of oxycodone (alerted only on mircomedex)  Moderate: Losartan - may result in increased plasma levels of losartan and decreased plasma levels of the active metabolite.(alerted only on mircomedex)  Moderate: Rosuvastatin - may result in elevations in serum transaminase levels. (alerted only on mircomedex)   Current Home med drug interactions w/amiodarone (per Micromedex):  None that are currently prescribed.     Plan:   Sent a message to Dr. Billie Ruddy to discontinue ondansetron PRN given drug interaction and patient HR is elevated.  Will continue to monitor for ADEs for the medications that were not major interactions or only  seen on one tertiary resource.   Pharmacy will continue to monitor drug interactions with amiodarone.   Rowland Lathe 02/15/2020,6:42 PM

## 2020-02-15 NOTE — Plan of Care (Signed)
  Problem: Elimination: Goal: Will not experience complications related to urinary retention Outcome: Not Progressing  Continues to report pain with catheter.  Hematuria continues.

## 2020-02-15 NOTE — Progress Notes (Signed)
SUBJECTIVE: Patient resting comfortably. Denies chest pain or shortness of breath. Continues to have discomfort with his foley when moving.   Vitals:   02/15/20 0202 02/15/20 0424 02/15/20 0802 02/15/20 1136  BP: 127/82 117/72 124/74 (!) 118/58  Pulse: 96 70 (!) 103 65  Resp: (!) 21 18    Temp: 99.8 F (37.7 C) 98.4 F (36.9 C) 98.1 F (36.7 C) 98.4 F (36.9 C)  TempSrc: Oral Oral Oral Oral  SpO2: 95% 94% 98% 96%  Weight:  119.5 kg    Height:        Intake/Output Summary (Last 24 hours) at 02/15/2020 1228 Last data filed at 02/15/2020 1222 Gross per 24 hour  Intake 990.67 ml  Output 2200 ml  Net -1209.33 ml    LABS: Basic Metabolic Panel: Recent Labs    02/14/20 0457 02/15/20 0709  NA 136 137  K 4.1 4.1  CL 104 105  CO2 19* 19*  GLUCOSE 167* 125*  BUN 48* 53*  CREATININE 1.73* 2.50*  CALCIUM 9.6 9.4   Liver Function Tests: No results for input(s): AST, ALT, ALKPHOS, BILITOT, PROT, ALBUMIN in the last 72 hours. No results for input(s): LIPASE, AMYLASE in the last 72 hours. CBC: Recent Labs    02/14/20 0457 02/15/20 0709  WBC 10.4 11.3*  HGB 10.8* 10.6*  HCT 32.2* 32.2*  MCV 89.0 91.2  PLT 275 261   Cardiac Enzymes: No results for input(s): CKTOTAL, CKMB, CKMBINDEX, TROPONINI in the last 72 hours. BNP: Invalid input(s): POCBNP D-Dimer: No results for input(s): DDIMER in the last 72 hours. Hemoglobin A1C: No results for input(s): HGBA1C in the last 72 hours. Fasting Lipid Panel: No results for input(s): CHOL, HDL, LDLCALC, TRIG, CHOLHDL, LDLDIRECT in the last 72 hours. Thyroid Function Tests: Recent Labs    02/14/20 0457  TSH 2.051   Anemia Panel: Recent Labs    02/14/20 1214  VITAMINB12 232  FOLATE 7.4  FERRITIN 827*  TIBC 167*  IRON 29*  RETICCTPCT 1.8     PHYSICAL EXAM General: Well developed, well nourished, in no acute distress HEENT:  Normocephalic and atramatic Neck:  No JVD.  Lungs: Clear bilaterally to auscultation and  percussion. Heart: Atrial Fibrillation. Normal S1 and S2 without gallops or murmurs.  Abdomen: Bowel sounds are positive, abdomen soft and non-tender  Msk:  Back normal, normal gait. Normal strength and tone for age. Extremities: No clubbing, cyanosis or edema.   Neuro: Alert and oriented X 3. Psych:  Good affect, responds appropriately  TELEMETRY: NSR 73/bpm on first assessment then converted to Atrial Fibrillation 95/bpm  ASSESSMENT AND PLAN: Patient presenting to the emergency room with hematuria and lightheadedness found to have a UTI and pulmonary embolus who then developed atrial fibrillation with rapid ventricular rate. Patient had remained in normal sinus but has converted back to atrial fibrillation. Will initiate loading oral dose of Amiodarone 400mg  bid as patient remains rate controlled on metoprolol tartrate 50mg  bid. If patient develops RVR again, then would recommended Amiodarone infusion. Continue heparin infusion for pulmonary embolus and transition to Xarelto or Eliwuis once cleared by Urology. Echo revealed EF 60-65% with Mild LVH with grade 1 diastolic dysfunction. We will continue to follow.  Principal Problem:   Complicated UTI (urinary tract infection) Active Problems:   Stroke ([place])   Seizures (H[facility])   Prostate cancer (H[facility])   Hypotension   Hypertension   Type II diabetes mellitus with renal manifestations (H[facility])   Acute renal failure superimposed on stage 3a chronic  kidney disease ([place])   Chronic diastolic CHF (congestive heart failure) (HCC)   Atrial fibrillation with RVR ([place])    Adaline Sill, NP-C 02/15/2020 12:28 PM

## 2020-02-15 NOTE — Progress Notes (Signed)
   02/14/20 2207  Assess: MEWS Score  Temp 100 F (37.8 C)  BP 138/82  Pulse Rate (!) 125  Resp 20  Level of Consciousness Alert  SpO2 95 %  O2 Device Room Air  Patient Activity (if Appropriate) In bed  Assess: MEWS Score  MEWS Temp 0  MEWS Systolic 0  MEWS Pulse 2  MEWS RR 0  MEWS LOC 0  MEWS Score 2  MEWS Score Color Yellow  Assess: if the MEWS score is Yellow or Red  Were vital signs taken at a resting state? Yes  Focused Assessment No change from prior assessment  Early Detection of Sepsis Score *See Row Information* Low  MEWS guidelines implemented *See Row Information* Yes  Treat  MEWS Interventions Escalated (See documentation below)  Pain Scale 0-10  Pain Score 2  Pain Intervention(s) Medication (See eMAR)  Take Vital Signs  Increase Vital Sign Frequency  Yellow: Q 2hr X 2 then Q 4hr X 2, if remains yellow, continue Q 4hrs  Escalate  MEWS: Escalate Yellow: discuss with charge nurse/RN and consider discussing with provider and RRT  Notify: Charge Nurse/RN  Name of Charge Nurse/RN Notified Levester Fresh  Date Charge Nurse/RN Notified 02/14/20  Time Charge Nurse/RN Notified 2200  Notify: Provider  Provider Name/Title Sharion Settler  Date Provider Notified 02/14/20  Time Provider Notified 2200  Notification Type Face-to-face  Notification Reason Other (Comment) (HR elevated)  Response See new orders  Date of Provider Response 02/14/20  Time of Provider Response 2200

## 2020-02-15 NOTE — Progress Notes (Signed)
Urology Consult Follow Up  Subjective: Patient resting comfortably in bed.  He is having pain associated with the Foley catheter with movement.  There is a question whether or not patient had tugged on Foley as he was found in the bathroom by nursing staff with Foley disconnected from Foley bag.  VSS afebrile   Serum creatinine up to 2.50 from 1.73 yesterday.  Hemoglobin hematocrit are stable.  Foley in place draining cherry red urine.  No clots seen.  Good UOP.    Urine culture positive for Staph epidermidis.  Receiving Rocephin.  Blood cultures with no growth in 2 days.    Anti-infectives: Anti-infectives (From admission, onward)   Start     Dose/Rate Route Frequency Ordered Stop   02/13/20 1000  ceFEPIme (MAXIPIME) 2 g in sodium chloride 0.9 % 100 mL IVPB  Status:  Discontinued        2 g 200 mL/hr over 30 Minutes Intravenous  Once 02/13/20 0942 02/13/20 0953   02/13/20 1000  cefTRIAXone (ROCEPHIN) 2 g in sodium chloride 0.9 % 100 mL IVPB        2 g 200 mL/hr over 30 Minutes Intravenous Every 24 hours 02/13/20 0954 02/20/20 0959      Current Facility-Administered Medications  Medication Dose Route Frequency Provider Last Rate Last Admin  . 0.9 %  sodium chloride infusion   Intravenous Continuous Ivor Costa, MD   Stopped at 02/14/20 1141  . acetaminophen (TYLENOL) tablet 650 mg  650 mg Oral Q6H PRN Ivor Costa, MD      . amiodarone (PACERONE) tablet 400 mg  400 mg Oral BID Adaline Sill, NP      . cefTRIAXone (ROCEPHIN) 2 g in sodium chloride 0.9 % 100 mL IVPB  2 g Intravenous Q24H Oswald Hillock, RPH   Stopped at 02/14/20 1140  . Chlorhexidine Gluconate Cloth 2 % PADS 6 each  6 each Topical Daily Ivor Costa, MD   6 each at 02/15/20 0849  . darolutamide (NUBEQA) tablet 600 mg  600 mg Oral BID WC Ivor Costa, MD      . heparin ADULT infusion 100 units/mL (25000 units/276mL sodium chloride 0.45%)  1,850 Units/hr Intravenous Continuous Hart Robinsons A, RPH 18.5 mL/hr at 02/15/20 0847  1,850 Units/hr at 02/15/20 0847  . hydrALAZINE (APRESOLINE) injection 5 mg  5 mg Intravenous Q2H PRN Ivor Costa, MD      . insulin aspart (novoLOG) injection 0-5 Units  0-5 Units Subcutaneous QHS Ivor Costa, MD      . insulin aspart (novoLOG) injection 0-9 Units  0-9 Units Subcutaneous TID WC Ivor Costa, MD   1 Units at 02/14/20 1545  . levETIRAcetam (KEPPRA) tablet 500 mg  500 mg Oral BID Ivor Costa, MD   500 mg at 02/15/20 0849  . LORazepam (ATIVAN) injection 0.5 mg  0.5 mg Intravenous Q12H PRN Ivor Costa, MD   0.5 mg at 02/13/20 1431  . LORazepam (ATIVAN) injection 0.5-1 mg  0.5-1 mg Intravenous Q2H PRN Ivor Costa, MD      . metoprolol tartrate (LOPRESSOR) tablet 50 mg  50 mg Oral BID Ivor Costa, MD   50 mg at 02/15/20 0849  . ondansetron (ZOFRAN) injection 4 mg  4 mg Intravenous Q8H PRN Ivor Costa, MD      . oxyCODONE-acetaminophen (PERCOCET/ROXICET) 5-325 MG per tablet 1 tablet  1 tablet Oral Q6H PRN Ivor Costa, MD   1 tablet at 02/14/20 2120  . rosuvastatin (CRESTOR) tablet 5 mg  5 mg  Oral Gordan Payment, MD   5 mg at 02/14/20 2120  . sodium chloride flush (NS) 0.9 % injection 3 mL  3 mL Intravenous PRN Lorella Nimrod, MD   3 mL at 02/14/20 2314     Objective: Vital signs in last 24 hours: Temp:  [98.1 F (36.7 C)-100 F (37.8 C)] 98.1 F (36.7 C) (10/13 0802) Pulse Rate:  [69-142] 103 (10/13 0802) Resp:  [18-22] 18 (10/13 0424) BP: (101-155)/(68-90) 124/74 (10/13 0802) SpO2:  [93 %-100 %] 98 % (10/13 0802) Weight:  [119.5 kg-120.7 kg] 119.5 kg (10/13 0424)  Intake/Output from previous day: 10/12 0701 - 10/13 0700 In: 851.5 [P.O.:240; I.V.:611.5] Out: 1400 [Urine:1400] Intake/Output this shift: Total I/O In: 95.9 [I.V.:95.9] Out: -    Physical Exam Constitutional:  Well nourished. Alert and oriented, No acute distress. HEENT: [place] AT, moist mucus membranes.  Trachea midline Cardiovascular: No clubbing, cyanosis, or edema. Respiratory: Normal respiratory effort, no  increased work of breathing. GI: Abdomen is soft, non tender, non distended, no abdominal masses.  GU: No CVA tenderness.  No bladder fullness or masses.  Patient with circumcised phallus.  Foley in place draining cherry red urine without clots. Neurologic: Grossly intact, no focal deficits, moving all 4 extremities. Psychiatric: Normal mood and affect.  Lab Results:  Recent Labs    02/14/20 0457 02/15/20 0709  WBC 10.4 11.3*  HGB 10.8* 10.6*  HCT 32.2* 32.2*  PLT 275 261   BMET Recent Labs    02/14/20 0457 02/15/20 0709  NA 136 137  K 4.1 4.1  CL 104 105  CO2 19* 19*  GLUCOSE 167* 125*  BUN 48* 53*  CREATININE 1.73* 2.50*  CALCIUM 9.6 9.4   PT/INR Recent Labs    02/13/20 1022  LABPROT 15.0  INR 1.2   ABG No results for input(s): PHART, HCO3 in the last 72 hours.  Invalid input(s): PCO2, PO2  Studies/Results: CT ANGIO CHEST PE W OR WO CONTRAST  Result Date: 02/13/2020 CLINICAL DATA:  Atrial fibrillation, elevated D-dimer, prostate cancer EXAM: CT ANGIOGRAPHY CHEST WITH CONTRAST TECHNIQUE: Multidetector CT imaging of the chest was performed using the standard protocol during bolus administration of intravenous contrast. Multiplanar CT image reconstructions and MIPs were obtained to evaluate the vascular anatomy. CONTRAST:  16mL OMNIPAQUE IOHEXOL 350 MG/ML SOLN COMPARISON:  None. FINDINGS: Cardiovascular: There is adequate opacification of the pulmonary arterial tree. There are multiple branching central intraluminal filling defects identified within the right upper and right lower lobar pulmonary arteries as well as multiple segmental pulmonary arteries of the left lower lobe in keeping with acute pulmonary embolism. The central pulmonary arteries are enlarged in keeping with changes of pulmonary arterial hypertension. However, there is no CT evidence of right heart strain with the right to left ventricular ratio within normal limits. Moderate coronary artery  calcification. Cardiac size is mildly enlarged. Moderate calcification of the mitral valve annulus. No pericardial effusion. The thoracic aorta is of normal caliber. Mild atherosclerotic calcification is seen within the aortic arch and descending thoracic aorta. Mediastinum/Nodes: There is pathologic bilateral hilar adenopathy. Index lymph node within the right hilum measures 16 mm x 25 mm at axial image # 42/4. Thyroid unremarkable. Esophagus unremarkable. Lungs/Pleura: Multiple bilateral pulmonary masses are identified most in keeping with pulmonary metastatic disease in this patient with a known history of prostate cancer. Index lesion within the right lower lobe measures 3.5 x 5.2 cm at axial image # 62/6. At least 50 nodules are seen scattered throughout the  lungs bilaterally. No pneumothorax or pleural effusion. The central airways are widely patent. Upper Abdomen: Unremarkable Musculoskeletal: Multiple sclerotic metastases are identified within the visualized axial skeleton involving the scapula bilaterally, the sternum, the thoracic spine, and multiple ribs bilaterally. No pathologic fracture. Review of the MIP images confirms the above findings. IMPRESSION: Acute pulmonary embolism. Moderate embolic burden. No CT evidence of right heart strain. Moderate coronary artery calcification.  Mild global cardiomegaly. Innumerable bilateral pulmonary masses and nodules as well as bilateral pathologic hilar adenopathy and widespread sclerotic osseous metastases in keeping with visceral and osseous metastatic disease related to the patient's underlying prostate cancer. Aortic Atherosclerosis (ICD10-I70.0). Electronically Signed   By: Fidela Salisbury MD   On: 02/13/2020 23:27   US Venous Img Lower Bilateral (DVT)  Result Date: 02/14/2020 CLINICAL DATA:  Pulmonary embolus EXAM: LEFT LOWER EXTREMITY VENOUS DOPPLER ULTRASOUND TECHNIQUE: Gray-scale sonography with graded compression, as well as color Doppler and duplex  ultrasound were performed to evaluate the lower extremity deep venous systems from the level of the common femoral vein and including the common femoral, femoral, profunda femoral, popliteal and calf veins including the posterior tibial, peroneal and gastrocnemius veins when visible. The superficial great saphenous vein was also interrogated. Spectral Doppler was utilized to evaluate flow at rest and with distal augmentation maneuvers in the common femoral, femoral and popliteal veins. COMPARISON:  None. FINDINGS: Contralateral Common Femoral Vein: Respiratory phasicity is normal and symmetric with the symptomatic side. No evidence of thrombus. Normal compressibility. Common Femoral Vein: No evidence of thrombus. Normal compressibility, respiratory phasicity and response to augmentation. Saphenofemoral Junction: No evidence of thrombus. Normal compressibility and flow on color Doppler imaging. Profunda Femoral Vein: No evidence of thrombus. Normal compressibility and flow on color Doppler imaging. Femoral Vein: No evidence of thrombus. Normal compressibility, respiratory phasicity and response to augmentation. Popliteal Vein: No evidence of thrombus. Normal compressibility, respiratory phasicity and response to augmentation. Calf Veins: There is nonocclusive thrombus within the left peroneal veins. Superficial Great Saphenous Vein: No evidence of thrombus. Normal compressibility. Venous Reflux:  None. Other Findings:  None. IMPRESSION: Nonocclusive thrombus within the left peroneal veins. Electronically Signed   By: Ulyses Jarred M.D.   On: 02/14/2020 01:36   ECHOCARDIOGRAM COMPLETE  Result Date: 02/14/2020    ECHOCARDIOGRAM REPORT   Patient Name:   HADI DUBIN Date of Exam: 02/14/2020 Medical Rec #:  [id number]       Height:       73.0 in Accession #:    [id number]      Weight:       270.0 lb Date of Birth:  Feb 21, 1946        BSA:          2.444 m Patient Age:    19 years        BP:           132/71 mmHg  Patient Gender: M               HR:           70 bpm. Exam Location:  [facility] Procedure: 2D Echo, Color Doppler, Cardiac Doppler and Intracardiac            Opacification Agent Indications:     I48.91 Atrial Fibrillation  History:         Patient has prior history of Echocardiogram examinations.                  Stroke; Risk Factors:Sleep Apnea,  Hypertension, Dyslipidemia                  and Diabetes.  Sonographer:     Charmayne Sheer RDCS (AE) Referring Phys:  [id number] [place] Diagnosing Phys: Neoma Laming MD  Sonographer Comments: Technically difficult study due to poor echo windows. Image acquisition challenging due to patient body habitus. IMPRESSIONS  1. Left ventricular ejection fraction, by estimation, is 60 to 65%. The left ventricle has normal function. The left ventricle has no regional wall motion abnormalities. There is mild left ventricular hypertrophy. Left ventricular diastolic parameters are consistent with Grade I diastolic dysfunction (impaired relaxation).  2. Right ventricular systolic function is normal. The right ventricular size is normal.  3. Left atrial size was mildly dilated.  4. Right atrial size was mildly dilated.  5. The mitral valve is normal in structure. No evidence of mitral valve regurgitation. No evidence of mitral stenosis.  6. The aortic valve is normal in structure. Aortic valve regurgitation is not visualized. Mild aortic valve sclerosis is present, with no evidence of aortic valve stenosis.  7. The inferior vena cava is normal in size with greater than 50% respiratory variability, suggesting right atrial pressure of 3 mmHg. FINDINGS  Left Ventricle: Left ventricular ejection fraction, by estimation, is 60 to 65%. The left ventricle has normal function. The left ventricle has no regional wall motion abnormalities. Definity contrast agent was given IV to delineate the left ventricular  endocardial borders. The left ventricular internal cavity size was normal in size. There is  mild left ventricular hypertrophy. Left ventricular diastolic parameters are consistent with Grade I diastolic dysfunction (impaired relaxation). Right Ventricle: The right ventricular size is normal. No increase in right ventricular wall thickness. Right ventricular systolic function is normal. Left Atrium: Left atrial size was mildly dilated. Right Atrium: Right atrial size was mildly dilated. Pericardium: There is no evidence of pericardial effusion. Mitral Valve: The mitral valve is normal in structure. No evidence of mitral valve regurgitation. No evidence of mitral valve stenosis. MV peak gradient, 6.0 mmHg. The mean mitral valve gradient is 2.0 mmHg. Tricuspid Valve: The tricuspid valve is normal in structure. Tricuspid valve regurgitation is not demonstrated. No evidence of tricuspid stenosis. Aortic Valve: The aortic valve is normal in structure. Aortic valve regurgitation is not visualized. Mild aortic valve sclerosis is present, with no evidence of aortic valve stenosis. Aortic valve mean gradient measures 3.0 mmHg. Aortic valve peak gradient measures 7.3 mmHg. Pulmonic Valve: The pulmonic valve was normal in structure. Pulmonic valve regurgitation is not visualized. No evidence of pulmonic stenosis. Aorta: The aortic root is normal in size and structure. Venous: The inferior vena cava is normal in size with greater than 50% respiratory variability, suggesting right atrial pressure of 3 mmHg. IAS/Shunts: No atrial level shunt detected by color flow Doppler.   LV Volumes (MOD) LV vol d, MOD A4C: 91.4 ml Diastology LV vol s, MOD A4C: 26.8 ml LV e' medial:    8.81 cm/s LV SV MOD A4C:     91.4 ml LV E/e' medial:  7.1                            LV e' lateral:   12.40 cm/s                            LV E/e' lateral: 5.0  AORTIC VALVE AV Vmax:  135.00 cm/s AV Vmean:          84.100 cm/s AV VTI:            0.227 m AV Peak Grad:      7.3 mmHg AV Mean Grad:      3.0 mmHg LVOT Vmax:         84.20 cm/s LVOT  Vmean:        56.100 cm/s LVOT VTI:          0.108 m LVOT/AV VTI ratio: 0.48 MITRAL VALVE MV Area (PHT): 4.49 cm    SHUNTS MV Peak grad:  6.0 mmHg    Systemic VTI: 0.11 m MV Mean grad:  2.0 mmHg MV Vmax:       1.22 m/s MV Vmean:      64.5 cm/s MV Decel Time: 169 msec MV E velocity: 62.60 cm/s MV A velocity: 85.30 cm/s MV E/A ratio:  0.73 Neoma Laming MD Electronically signed by Neoma Laming MD Signature Date/Time: 02/14/2020/4:30:36 PM    Final    CT Renal Stone Study  Result Date: 02/13/2020 CLINICAL DATA:  Prostate cancer. Pink colored urine. Hypotension. Flank pain. EXAM: CT ABDOMEN AND PELVIS WITHOUT CONTRAST TECHNIQUE: Multidetector CT imaging of the abdomen and pelvis was performed following the standard protocol without IV contrast. COMPARISON:  12/23/2017 CT abdomen/pelvis. FINDINGS: Lower chest: Numerous (greater than 20) solid pulmonary nodules and masses at both lung bases, largest 5.2 cm in the basilar right lower lobe (series 4/image 27) and 5.5 cm in the basilar left lower lobe (series 4/image 27), all new. Atherosclerosis. Hepatobiliary: Multiple (at least 5) hypodense liver masses scattered throughout the liver, largest 3.8 cm in the segment 6 right liver lobe (series 2/image 36) and 2.0 cm in the superior right liver (series 2/image 22), all new. Cholelithiasis. No gallbladder wall thickening or pericholecystic fluid. No biliary ductal dilatation. Pancreas: Normal, with no mass or duct dilation. Spleen: Normal size. No mass. Adrenals/Urinary Tract: Normal right adrenal. Left adrenal 1.5 cm nodule with density 17 HU, stable, most compatible with an adenoma. Moderate bilateral hydroureteronephrosis to the level of the ureterovesical junctions bilaterally. No renal stones. No ureteral stones. Simple 3.7 cm posterior lower left renal cyst. No additional contour deforming renal lesions. Multiple layering stones in distended bladder, largest 19 mm. Chronic mild diffuse bladder wall thickening is  unchanged. Foley catheter in place with balloon in the dependent bladder. Stomach/Bowel: Normal non-distended stomach. Normal caliber small bowel with no small bowel wall thickening. Normal appendix. Normal large bowel with no diverticulosis, large bowel wall thickening or pericolonic fat stranding. Vascular/Lymphatic: Atherosclerotic nonaneurysmal abdominal aorta. No pathologically enlarged lymph nodes in the abdomen or pelvis. Reproductive: Newly mildly enlarged, irregular and poorly marginated prostate, directly contiguous with the bladder trigone. Fiducial markers noted in the prostate as before. Other: No pneumoperitoneum, ascites or focal fluid collection. Solid subcutaneous 2.9 cm lateral right chest wall mass (series 2/image 29), new. Similar solid subcutaneous 4.4 cm left gluteal mass (series 2/image 63). Small fat containing umbilical hernia is stable. Musculoskeletal: Numerous new sclerotic osseous lesions throughout the visualized skeleton including multiple lower ribs bilaterally, multiple thoracolumbar vertebrae largest at T11 and throughout the bilateral pelvic girdle, most prominent in the left iliac crest (series 2/image 60). Marked lumbar spondylosis. IMPRESSION: 1. Numerous new solid pulmonary nodules and masses at both lung bases, largest 5.2 cm in the basilar right lower lobe, compatible with pulmonary metastases. 2. New widespread sclerotic osseous metastases throughout the visualized skeleton as detailed. 3.  Multiple new hypodense liver masses compatible with liver metastases. 4. Newly mildly enlarged, irregular and poorly marginated prostate, directly contiguous with the bladder trigone, suggestive of recurrent locally advanced prostate cancer. 5. Moderate bilateral hydroureteronephrosis to the level of the UVJ bilaterally, probably due to bladder outlet obstruction despite the well-positioned Foley catheter in the bladder. 6. Multiple layering bladder stones in the distended bladder. 7.  Cholelithiasis. 8. Stable left adrenal adenoma. 9. Aortic Atherosclerosis (ICD10-I70.0). Electronically Signed   By: Ilona Sorrel M.D.   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 [name] who was admitted for gross hematuria, weakness, bladder/penile pain and UTI.     There is a question whether patient dislodged the Foley catheter as he was found in the bathroom with a catheter disconnected from his drainage bag.  Foley catheter continues to drain cherry red urine without clots with good urine output.    Recommendations: Continue Foley, manage bladder spasms with oxybutynin and B&O suppositories If Foley catheter needs to be replaced, please consult urology to do so as it need to stay indwelling for at least two weeks Hand irrigate Foley as needed to ensure appropriate drainage Continue to trend creatinine and obtain renal ultrasound if creatinine continues to trend upwards Continue to trend hemoglobin hematocrit Continue culture appropriate antibiotics Patient is scheduled for Axumin scan tomorrow pending findings we may need to schedule up a follow-up cystoscopy once catheter has been indwelling for the 2 weeks    LOS: 2 days    [facility] 02/15/2020

## 2020-02-15 NOTE — Plan of Care (Signed)
  Problem: Cardiac: Goal: Ability to achieve and maintain adequate cardiopulmonary perfusion will improve Outcome: Progressing  Afib with RVR earlier in shift;HR decreased with IV metoprolol.

## 2020-02-15 NOTE — Progress Notes (Signed)
PROGRESS NOTE    Timothy Maddox  FYB:[id number] DOB: Feb 21, 1946 DOA: 02/13/2020 PCP: [name], MD   Brief Narrative: Taken from H&P Timothy Maddox is a 74 y.o. 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  Pt has a h/o prostate CA, urethral stricture. He is s/p of recent cystoscopy and foley placement by Dr. [name] on 02/07/20. On arrival he was hypotensive which improved with IV fluids, UA looks infected, COVID-19 negative, worsening renal function.  CT renal stone study was concerning for multiple mets involving lung, osseous mets and liver.  Moderate bilateral hydronephrosis to the level of UVJ bilaterally and multiple layering bladder stones surrounding the Foley catheter tip. CTA was obtained due to elevated D-dimer and came back positive for PE, without any CT evidence of right heart strain, patient was started on IV heparin. Developed new onset A. fib with RVR-started on Cardizem infusion, overnight converted back to sinus rhythm and Cardizem infusion was discontinued today.  Cardiology was also consulted.  Subjective: Pt complained of pain in his penis with any slight movement of the Foley cath.  Otherwise, no abdominal pain.  Eating ok.   Assessment & Plan:   Principal Problem:   Complicated UTI (urinary tract infection) Active Problems:   Stroke ([place])   Seizures ([place])   Prostate cancer (HCC)   Hypotension   Hypertension   Type II diabetes mellitus with renal manifestations (HCC)   Acute renal failure superimposed on stage 3a chronic kidney disease (HCC)   Chronic diastolic CHF (congestive heart failure) (HCC)   Atrial fibrillation with RVR (HCC)  Complicated UTI/hydronephrosis/multiple bladder stones.  Recent cystoscopy.  Blood cultures negative so far.  Urinary cultures pos for Staphylococcus epidermidis,  questionable significance.   PLAN: --continue ceftriaxone as empiric tx --continue Foley   Urethral injury.   After pulling Foley catheter. -Informed by urology. -Foley if needs to be replaced should be done by urology.  Prostate cancer.  Patient was waiting for PET scan for concern of recurrence of bladder cancer.  CT renal stone study and CTA are concerning for multiple mets including lungs, liver and bones. -Patient follows with Dr. [name].  -continue [place].  New onset A. fib with RVR.  Most likely secondary to PE.  Cardiology was consulted and he was started on Cardizem infusion. --initiated amiodarone oral loading dose per cardiology.  --HR went up to 110-140's Afib evening today PLAN: --Start amiodarone infusion, per cardiology rec -Continue with heparin infusion. -Continue with metoprolol  Pulmonary embolism.  CTA was done due to elevated D-dimer above 4000.  It was positive for PE.  Patient is high risk secondary to extensive malignancy. Started on heparin infusion. PLAN: --continue heparin gtt --can be switched to Xarelto or Eliquis if no emergent procedure by urology.  Anemia.  Hemoglobin dropped to 10.8 this morning from 12.3.  Most likely secondary to hematuria.  Urine appears clear when seen today. Anemia panel consistent with mild iron deficiency along with anemia of chronic disease.  Pending B12 levels. PLAN: --Monitor Hgb in light of hematuria -Transfuse if below 7  History of CVA.  No acute concern. -Continue Lipitor  History of seizure disorder. -Continue home dose of Keppra. -Continue with seizure precautions -As needed Ativan  Hypertension.  Blood pressure within goal today. -Continue metoprolol. -Holding home dose of spironolactone and Hyzaar. -Continue IV hydralazine as needed  AKI with CKD stage IIIa.  Baseline creatinine around 1.2-1.6.  Stable today around 1.73.  Patient appears dehydrated and hypotensive on admission and he was continue to take  Hyzaar and spironolactone. -Continue to hold Cozaar and spironolactone. -Continue to monitor. -Avoid nephrotoxins.  Chronic diastolic heart failure.  Prior echo done in 2017 with normal EF and grade 1 diastolic dysfunction.  Patient appears euvolemic. -Continue to monitor. -Holding home dose of spironolactone. -Continue metoprolol  Type II diabetes mellitus with renal manifestations [facility]): Recent A1c 6.3, well controlled.  Patient taking Metformin at home. --BG has been within goal for inpatient, and pt hasn't required more than 1 or 2 units of insulin per day --d/c fingersticks   Objective: Vitals:   02/15/20 0802 02/15/20 1136 02/15/20 1643 02/15/20 1750  BP: 124/74 (!) 118/58 117/74 (!) 148/78  Pulse: (!) 103 65 69   Resp:      Temp: 98.1 F (36.7 C) 98.4 F (36.9 C) 98.2 F (36.8 C)   TempSrc: Oral Oral Oral   SpO2: 98% 96% 96%   Weight:      Height:        Intake/Output Summary (Last 24 hours) at 02/15/2020 1859 Last data filed at 02/15/2020 1845 Gross per 24 hour  Intake 765.8 ml  Output 2200 ml  Net -1434.2 ml   Filed Weights   02/12/20 1732 02/14/20 1616 02/15/20 0424  Weight: 122.5 kg 120.7 kg 119.5 kg    Examination:  Constitutional: NAD, AAOx3 HEENT: conjunctivae and lids normal, EOMI, hard of hearing CV: RRR no M,R,G. Distal pulses +2.  No cyanosis.   RESP: CTA B/L, normal respiratory effort  GI: +BS, NTND, abdomen large Extremities: No effusions, edema in BLE SKIN: warm, dry and intact Neuro: II - XII grossly intact.  Sensation intact Psych: Normal mood and affect.    Foley cath present, outputting dark bloody urine.    DVT prophylaxis: Heparin infusion Code Status: Full Family Communication: updated daughter at the bedside today Disposition Plan:  Status is: Inpatient  The patient is from: home Anticipated d/c is to: undetermined Anticipated d/c date is: 2-3 days Patient currently is not medically stable to d/c due to: still on heparin  gtt for PE, hematuria, Afib RVR returned currently on IV amiodarone gtt   Consultants:   Urology  Cardiology  Procedures:  Antimicrobials:  Ceftriaxone  Data Reviewed: I have personally reviewed following labs and imaging studies  CBC: Recent Labs  Lab 02/12/20 1758 02/13/20 0817 02/14/20 0457 02/15/20 0709  WBC 10.1 9.7 10.4 11.3*  HGB 12.0* 12.3* 10.8* 10.6*  HCT 35.8* 37.9* 32.2* 32.2*  MCV 89.3 92.4 89.0 91.2  PLT 287 226 275 856   Basic Metabolic Panel: Recent Labs  Lab 02/12/20 1758 02/13/20 0817 02/13/20 2043 02/14/20 0457 02/15/20 0709  NA 137 135 139 136 137  K 4.5 5.4* 3.9 4.1 4.1  CL 102 104 101 104 105  CO2 21* 15* 21* 19* 19*  GLUCOSE 163* 143* 141* 167* 125*  BUN 62* 56* 49* 48* 53*  CREATININE 2.19* 1.85* 1.70* 1.73* 2.50*  CALCIUM 9.9 9.7 9.7 9.6 9.4   GFR: Estimated Creatinine Clearance: 35.6 mL/min (A) (by C-G formula based on SCr of 2.5 mg/dL (H)). Liver Function Tests: No results for input(s): AST, ALT, ALKPHOS, BILITOT, PROT, ALBUMIN in the last 168 hours. No results for input(s): LIPASE, AMYLASE in the last 168 hours. No results for input(s): AMMONIA in the last 168 hours. Coagulation Profile: Recent Labs  Lab 02/13/20  1022  INR 1.2   Cardiac Enzymes: No results for input(s): CKTOTAL, CKMB, CKMBINDEX, TROPONINI in the last 168 hours. BNP (last 3 results) No results for input(s): PROBNP in the last 8760 hours. HbA1C: No results for input(s): HGBA1C in the last 72 hours. CBG: Recent Labs  Lab 02/14/20 1523 02/14/20 2038 02/15/20 0803 02/15/20 1139 02/15/20 1648  GLUCAP 138* 137* 119* 145* 130*   Lipid Profile: No results for input(s): CHOL, HDL, LDLCALC, TRIG, CHOLHDL, LDLDIRECT in the last 72 hours. Thyroid Function Tests: Recent Labs    02/14/20 0457  TSH 2.051   Anemia Panel: Recent Labs    02/14/20 1214  VITAMINB12 232  FOLATE 7.4  FERRITIN 827*  TIBC 167*  IRON 29*  RETICCTPCT 1.8   Sepsis  Labs: Recent Labs  Lab 02/13/20 1022 02/13/20 1437  PROCALCITON 0.42  --   LATICACIDVEN 3.4* 2.0*    Recent Results (from the past 240 hour(s))  Urine culture     Status: Abnormal   Collection Time: 02/13/20  8:46 AM   Specimen: Urine, Catheterized  Result Value Ref Range Status   Specimen Description   Final    URINE, CATHETERIZED Performed at [facility], [address] [place], [place] [address]    Special Requests   Final    NONE Performed at [facility], [place], [place] [address]    Culture 50,000 COLONIES/mL STAPHYLOCOCCUS EPIDERMIDIS (A)  Final   Report Status 02/15/2020 FINAL  Final   Organism ID, Bacteria STAPHYLOCOCCUS EPIDERMIDIS (A)  Final      Susceptibility   Staphylococcus epidermidis - MIC*    CIPROFLOXACIN >=8 RESISTANT Resistant     GENTAMICIN <=0.5 SENSITIVE Sensitive     NITROFURANTOIN <=16 SENSITIVE Sensitive     OXACILLIN <=0.25 SENSITIVE Sensitive     TETRACYCLINE 2 SENSITIVE Sensitive     VANCOMYCIN 1 SENSITIVE Sensitive     TRIMETH/SULFA >=320 RESISTANT Resistant     CLINDAMYCIN >=8 RESISTANT Resistant     RIFAMPIN <=0.5 SENSITIVE Sensitive     Inducible Clindamycin NEGATIVE Sensitive     * 50,000 COLONIES/mL STAPHYLOCOCCUS EPIDERMIDIS  Resp Panel by RT PCR (RSV, Flu A&B, Covid) - Nasopharyngeal Swab     Status: None   Collection Time: 02/13/20  9:59 AM   Specimen: Nasopharyngeal Swab  Result Value Ref Range Status   SARS Coronavirus 2 by RT PCR NEGATIVE NEGATIVE Final    Comment: (NOTE) SARS-CoV-2 target nucleic acids are NOT DETECTED.  The SARS-CoV-2 RNA is generally detectable in upper respiratoy specimens during the acute phase of infection. The lowest concentration of SARS-CoV-2 viral copies this assay can detect is 131 copies/mL. A negative result does not preclude SARS-Cov-2 infection and should not be used as the sole basis for treatment or other patient management decisions. A negative result  may occur with  improper specimen collection/handling, submission of specimen other than nasopharyngeal swab, presence of viral mutation(s) within the areas targeted by this assay, and inadequate number of viral copies (<131 copies/mL). A negative result must be combined with clinical observations, patient history, and epidemiological information. The expected result is Negative.  Fact Sheet for Patients:  PinkCheek.be  Fact Sheet for Healthcare Providers:  GravelBags.it  This test is no t yet approved or cleared by the [place] FDA and  has been authorized for detection and/or diagnosis of SARS-CoV-2 by FDA under an Emergency Use Authorization (EUA). This EUA will remain  in effect (meaning this test can be  used) for the duration of the COVID-19 declaration under Section 564(b)(1) of the Act, 21 U.S.C. section 360bbb-3(b)(1), unless the authorization is terminated or revoked sooner.     Influenza A by PCR NEGATIVE NEGATIVE Final   Influenza B by PCR NEGATIVE NEGATIVE Final    Comment: (NOTE) The Xpert Xpress SARS-CoV-2/FLU/RSV assay is intended as an aid in  the diagnosis of influenza from Nasopharyngeal swab specimens and  should not be used as a sole basis for treatment. Nasal washings and  aspirates are unacceptable for Xpert Xpress SARS-CoV-2/FLU/RSV  testing.  Fact Sheet for Patients: PinkCheek.be  Fact Sheet for Healthcare Providers: GravelBags.it  This test is not yet approved or cleared by the [place] FDA and  has been authorized for detection and/or diagnosis of SARS-CoV-2 by  FDA under an Emergency Use Authorization (EUA). This EUA will remain  in effect (meaning this test can be used) for the duration of the  Covid-19 declaration under Section 564(b)(1) of the Act, 21  U.S.C. section 360bbb-3(b)(1), unless the authorization is  terminated  or revoked.    Respiratory Syncytial Virus by PCR NEGATIVE NEGATIVE Final    Comment: (NOTE) Fact Sheet for Patients: PinkCheek.be  Fact Sheet for Healthcare Providers: GravelBags.it  This test is not yet approved or cleared by the [place] FDA and  has been authorized for detection and/or diagnosis of SARS-CoV-2 by  FDA under an Emergency Use Authorization (EUA). This EUA will remain  in effect (meaning this test can be used) for the duration of the  COVID-19 declaration under Section 564(b)(1) of the Act, 21 U.S.C.  section 360bbb-3(b)(1), unless the authorization is terminated or  revoked. Performed at [facility], [address] [place], [place] [address]   Culture, blood (Routine X 2) w Reflex to ID Panel     Status: None (Preliminary result)   Collection Time: 02/13/20 10:22 AM   Specimen: BLOOD  Result Value Ref Range Status   Specimen Description BLOOD LEFT HAND  Final   Special Requests   Final    BOTTLES DRAWN AEROBIC AND ANAEROBIC Blood Culture results may not be optimal due to an inadequate volume of blood received in culture bottles   Culture   Final    NO GROWTH 2 DAYS Performed at [facility], [address], [place], [place] [address]    Report Status PENDING  Incomplete  Culture, blood (Routine X 2) w Reflex to ID Panel     Status: None (Preliminary result)   Collection Time: 02/13/20 10:30 AM   Specimen: BLOOD  Result Value Ref Range Status   Specimen Description BLOOD LEFT ARM  Final   Special Requests   Final    BOTTLES DRAWN AEROBIC AND ANAEROBIC Blood Culture adequate volume   Culture   Final    NO GROWTH 2 DAYS Performed at [facility], [address], [place], [place] [address]    Report Status PENDING  Incomplete     Radiology Studies: CT ANGIO CHEST PE W OR WO CONTRAST  Result Date: 02/13/2020 CLINICAL DATA:  Atrial fibrillation, elevated D-dimer,  prostate cancer EXAM: CT ANGIOGRAPHY CHEST WITH CONTRAST TECHNIQUE: Multidetector CT imaging of the chest was performed using the standard protocol during bolus administration of intravenous contrast. Multiplanar CT image reconstructions and MIPs were obtained to evaluate the vascular anatomy. CONTRAST:  52mL OMNIPAQUE IOHEXOL 350 MG/ML SOLN COMPARISON:  None. FINDINGS: Cardiovascular: There is adequate opacification of the pulmonary arterial tree. There are multiple branching central  intraluminal filling defects identified within the right upper and right lower lobar pulmonary arteries as well as multiple segmental pulmonary arteries of the left lower lobe in keeping with acute pulmonary embolism. The central pulmonary arteries are enlarged in keeping with changes of pulmonary arterial hypertension. However, there is no CT evidence of right heart strain with the right to left ventricular ratio within normal limits. Moderate coronary artery calcification. Cardiac size is mildly enlarged. Moderate calcification of the mitral valve annulus. No pericardial effusion. The thoracic aorta is of normal caliber. Mild atherosclerotic calcification is seen within the aortic arch and descending thoracic aorta. Mediastinum/Nodes: There is pathologic bilateral hilar adenopathy. Index lymph node within the right hilum measures 16 mm x 25 mm at axial image # 42/4. Thyroid unremarkable. Esophagus unremarkable. Lungs/Pleura: Multiple bilateral pulmonary masses are identified most in keeping with pulmonary metastatic disease in this patient with a known history of prostate cancer. Index lesion within the right lower lobe measures 3.5 x 5.2 cm at axial image # 62/6. At least 50 nodules are seen scattered throughout the lungs bilaterally. No pneumothorax or pleural effusion. The central airways are widely patent. Upper Abdomen: Unremarkable Musculoskeletal: Multiple sclerotic metastases are identified within the visualized axial  skeleton involving the scapula bilaterally, the sternum, the thoracic spine, and multiple ribs bilaterally. No pathologic fracture. Review of the MIP images confirms the above findings. IMPRESSION: Acute pulmonary embolism. Moderate embolic burden. No CT evidence of right heart strain. Moderate coronary artery calcification.  Mild global cardiomegaly. Innumerable bilateral pulmonary masses and nodules as well as bilateral pathologic hilar adenopathy and widespread sclerotic osseous metastases in keeping with visceral and osseous metastatic disease related to the patient's underlying prostate cancer. Aortic Atherosclerosis (ICD10-I70.0). Electronically Signed   By: Fidela Salisbury MD   On: 02/13/2020 23:27   US Venous Img Lower Bilateral (DVT)  Result Date: 02/14/2020 CLINICAL DATA:  Pulmonary embolus EXAM: LEFT LOWER EXTREMITY VENOUS DOPPLER ULTRASOUND TECHNIQUE: Gray-scale sonography with graded compression, as well as color Doppler and duplex ultrasound were performed to evaluate the lower extremity deep venous systems from the level of the common femoral vein and including the common femoral, femoral, profunda femoral, popliteal and calf veins including the posterior tibial, peroneal and gastrocnemius veins when visible. The superficial great saphenous vein was also interrogated. Spectral Doppler was utilized to evaluate flow at rest and with distal augmentation maneuvers in the common femoral, femoral and popliteal veins. COMPARISON:  None. FINDINGS: Contralateral Common Femoral Vein: Respiratory phasicity is normal and symmetric with the symptomatic side. No evidence of thrombus. Normal compressibility. Common Femoral Vein: No evidence of thrombus. Normal compressibility, respiratory phasicity and response to augmentation. Saphenofemoral Junction: No evidence of thrombus. Normal compressibility and flow on color Doppler imaging. Profunda Femoral Vein: No evidence of thrombus. Normal compressibility and flow  on color Doppler imaging. Femoral Vein: No evidence of thrombus. Normal compressibility, respiratory phasicity and response to augmentation. Popliteal Vein: No evidence of thrombus. Normal compressibility, respiratory phasicity and response to augmentation. Calf Veins: There is nonocclusive thrombus within the left peroneal veins. Superficial Great Saphenous Vein: No evidence of thrombus. Normal compressibility. Venous Reflux:  None. Other Findings:  None. IMPRESSION: Nonocclusive thrombus within the left peroneal veins. Electronically Signed   By: Ulyses Jarred M.D.   On: 02/14/2020 01:36   ECHOCARDIOGRAM COMPLETE  Result Date: 02/14/2020    ECHOCARDIOGRAM REPORT   Patient Name:   ROMULUS HANRAHAN Date of Exam: 02/14/2020 Medical Rec #:  [id number]  Height:       73.0 in Accession #:    [id number]      Weight:       270.0 lb Date of Birth:  [birth year]        BSA:          2.444 m Patient Age:    19 years        BP:           132/71 mmHg Patient Gender: M               HR:           70 bpm. Exam Location:  [facility] Procedure: 2D Echo, Color Doppler, Cardiac Doppler and Intracardiac            Opacification Agent Indications:     I48.91 Atrial Fibrillation  History:         Patient has prior history of Echocardiogram examinations.                  Stroke; Risk Factors:Sleep Apnea, Hypertension, Dyslipidemia                  and Diabetes.  Sonographer:     Charmayne Sheer RDCS (AE) Referring Phys:  [id number] [place] Diagnosing Phys: Neoma Laming MD  Sonographer Comments: Technically difficult study due to poor echo windows. Image acquisition challenging due to patient body habitus. IMPRESSIONS  1. Left ventricular ejection fraction, by estimation, is 60 to 65%. The left ventricle has normal function. The left ventricle has no regional wall motion abnormalities. There is mild left ventricular hypertrophy. Left ventricular diastolic parameters are consistent with Grade I diastolic dysfunction (impaired relaxation).   2. Right ventricular systolic function is normal. The right ventricular size is normal.  3. Left atrial size was mildly dilated.  4. Right atrial size was mildly dilated.  5. The mitral valve is normal in structure. No evidence of mitral valve regurgitation. No evidence of mitral stenosis.  6. The aortic valve is normal in structure. Aortic valve regurgitation is not visualized. Mild aortic valve sclerosis is present, with no evidence of aortic valve stenosis.  7. The inferior vena cava is normal in size with greater than 50% respiratory variability, suggesting right atrial pressure of 3 mmHg. FINDINGS  Left Ventricle: Left ventricular ejection fraction, by estimation, is 60 to 65%. The left ventricle has normal function. The left ventricle has no regional wall motion abnormalities. Definity contrast agent was given IV to delineate the left ventricular  endocardial borders. The left ventricular internal cavity size was normal in size. There is mild left ventricular hypertrophy. Left ventricular diastolic parameters are consistent with Grade I diastolic dysfunction (impaired relaxation). Right Ventricle: The right ventricular size is normal. No increase in right ventricular wall thickness. Right ventricular systolic function is normal. Left Atrium: Left atrial size was mildly dilated. Right Atrium: Right atrial size was mildly dilated. Pericardium: There is no evidence of pericardial effusion. Mitral Valve: The mitral valve is normal in structure. No evidence of mitral valve regurgitation. No evidence of mitral valve stenosis. MV peak gradient, 6.0 mmHg. The mean mitral valve gradient is 2.0 mmHg. Tricuspid Valve: The tricuspid valve is normal in structure. Tricuspid valve regurgitation is not demonstrated. No evidence of tricuspid stenosis. Aortic Valve: The aortic valve is normal in structure. Aortic valve regurgitation is not visualized. Mild aortic valve sclerosis is present, with no evidence of aortic valve  stenosis. Aortic valve mean gradient measures 3.0 mmHg.  Aortic valve peak gradient measures 7.3 mmHg. Pulmonic Valve: The pulmonic valve was normal in structure. Pulmonic valve regurgitation is not visualized. No evidence of pulmonic stenosis. Aorta: The aortic root is normal in size and structure. Venous: The inferior vena cava is normal in size with greater than 50% respiratory variability, suggesting right atrial pressure of 3 mmHg. IAS/Shunts: No atrial level shunt detected by color flow Doppler.   LV Volumes (MOD) LV vol d, MOD A4C: 91.4 ml Diastology LV vol s, MOD A4C: 26.8 ml LV e' medial:    8.81 cm/s LV SV MOD A4C:     91.4 ml LV E/e' medial:  7.1                            LV e' lateral:   12.40 cm/s                            LV E/e' lateral: 5.0  AORTIC VALVE AV Vmax:           135.00 cm/s AV Vmean:          84.100 cm/s AV VTI:            0.227 m AV Peak Grad:      7.3 mmHg AV Mean Grad:      3.0 mmHg LVOT Vmax:         84.20 cm/s LVOT Vmean:        56.100 cm/s LVOT VTI:          0.108 m LVOT/AV VTI ratio: 0.48 MITRAL VALVE MV Area (PHT): 4.49 cm    SHUNTS MV Peak grad:  6.0 mmHg    Systemic VTI: 0.11 m MV Mean grad:  2.0 mmHg MV Vmax:       1.22 m/s MV Vmean:      64.5 cm/s MV Decel Time: 169 msec MV E velocity: 62.60 cm/s MV A velocity: 85.30 cm/s MV E/A ratio:  0.73 Neoma Laming MD Electronically signed by Neoma Laming MD Signature Date/Time: 02/14/2020/4:30:36 PM    Final     Scheduled Meds: . amiodarone  400 mg Oral BID  . Chlorhexidine Gluconate Cloth  6 each Topical Daily  . darolutamide  600 mg Oral BID WC  . insulin aspart  0-5 Units Subcutaneous QHS  . insulin aspart  0-9 Units Subcutaneous TID WC  . levETIRAcetam  500 mg Oral BID  . metoprolol tartrate  50 mg Oral BID  . rosuvastatin  5 mg Oral QHS   Continuous Infusions: . sodium chloride Stopped (02/14/20 1141)  . amiodarone 60 mg/hr (02/15/20 1845)   Followed by  . [START ON 02/16/2020] amiodarone    . cefTRIAXone  (ROCEPHIN)  IV 2 g (02/15/20 0915)  . heparin 1,850 Units/hr (02/15/20 0847)     LOS: 2 days    Enzo Bi, MD [facility]  If 7PM-7AM, please contact night-coverage [email or web address]  02/15/2020, 6:59 PM

## 2020-02-15 NOTE — Progress Notes (Signed)
ANTICOAGULATION CONSULT NOTE - Follow up [place] for Heparin Indication: pulmonary embolus  Allergies  Allergen Reactions  . Poison Oak Extract Rash  . Poison Ivy Extract Rash    Patient Measurements: Height: 6\' 1"  (185.4 cm) Weight: 119.5 kg (263 lb 6.4 oz) IBW/kg (Calculated) : 79.9 HEPARIN DW (KG): 106.1  Vital Signs: Temp: 98.4 F (36.9 C) (10/13 0424) Temp Source: Oral (10/13 0424) BP: 117/72 (10/13 0424) Pulse Rate: 70 (10/13 0424)  Labs: Recent Labs    02/13/20 0817 02/13/20 0817 02/13/20 1022 02/13/20 2043 02/14/20 0457 02/14/20 1214 02/14/20 2156 02/15/20 0709  HGB 12.3*   < >  --   --  10.8*  --   --  10.6*  HCT 37.9*  --   --   --  32.2*  --   --  32.2*  PLT 226  --   --   --  275  --   --  261  APTT  --   --  38*  --   --   --   --   --   LABPROT  --   --  15.0  --   --   --   --   --   INR  --   --  1.2  --   --   --   --   --   HEPARINUNFRC  --   --   --   --   --  0.32 0.23* 0.49  CREATININE 1.85*   < >  --  1.70* 1.73*  --   --  2.50*  TROPONINIHS  --   --   --  23* 17 14  --   --    < > = values in this interval not displayed.    Estimated Creatinine Clearance: 35.6 mL/min (A) (by C-G formula based on SCr of 2.5 mg/dL (H)).   Assessment: 74 yo male with pulmonary embolism.  Baseline labs noted.  No anticoagulants PTA.  Heparin for PE.  10/13 0709 HL 0.49   Goal of Therapy:  Heparin level 0.3-0.7 units/ml Monitor platelets by anticoagulation protocol: Yes   Plan:  Heparin level is therapeutic. Continue heparin drip at current rate @ 1850 units/hr. Recheck HL in 8h. CBC daily while on heparin.   Pharmacy will continue to follow.   Oswald Hillock, PharmD, BCPS 02/15/2020,7:51 AM

## 2020-02-16 DIAGNOSIS — T83511A Infection and inflammatory reaction due to indwelling urethral catheter, initial encounter: Principal | ICD-10-CM

## 2020-02-16 DIAGNOSIS — N390 Urinary tract infection, site not specified: Secondary | ICD-10-CM | POA: Diagnosis not present

## 2020-02-16 DIAGNOSIS — I2699 Other pulmonary embolism without acute cor pulmonale: Secondary | ICD-10-CM | POA: Diagnosis present

## 2020-02-16 DIAGNOSIS — R160 Hepatomegaly, not elsewhere classified: Secondary | ICD-10-CM | POA: Diagnosis present

## 2020-02-16 LAB — BASIC METABOLIC PANEL
Anion gap: 12 (ref 5–15)
BUN: 42 mg/dL — ABNORMAL HIGH (ref 8–23)
CO2: 20 mmol/L — ABNORMAL LOW (ref 22–32)
Calcium: 8.9 mg/dL (ref 8.9–10.3)
Chloride: 105 mmol/L (ref 98–111)
Creatinine, Ser: 1.72 mg/dL — ABNORMAL HIGH (ref 0.61–1.24)
GFR, Estimated: 39 mL/min — ABNORMAL LOW (ref >60)
Glucose, Bld: 128 mg/dL — ABNORMAL HIGH (ref 70–99)
Potassium: 3.9 mmol/L (ref 3.5–5.1)
Sodium: 137 mmol/L (ref 135–145)

## 2020-02-16 LAB — CBC
HCT: 31 % — ABNORMAL LOW (ref 39.0–52.0)
Hemoglobin: 10.3 g/dL — ABNORMAL LOW (ref 13.0–17.0)
MCH: 29.4 pg (ref 26.0–34.0)
MCHC: 33.2 g/dL (ref 30.0–36.0)
MCV: 88.6 fL (ref 80.0–100.0)
Platelets: 269 10*3/uL (ref 150–400)
RBC: 3.5 MIL/uL — ABNORMAL LOW (ref 4.22–5.81)
RDW: 14.4 % (ref 11.5–15.5)
WBC: 9.8 10*3/uL (ref 4.0–10.5)
nRBC: 0 % (ref 0.0–0.2)

## 2020-02-16 LAB — HEPARIN LEVEL (UNFRACTIONATED)
Heparin Unfractionated: 0.2 IU/mL — ABNORMAL LOW (ref 0.30–0.70)
Heparin Unfractionated: 0.36 IU/mL (ref 0.30–0.70)
Heparin Unfractionated: 0.4 IU/mL (ref 0.30–0.70)

## 2020-02-16 LAB — MAGNESIUM: Magnesium: 2.4 mg/dL (ref 1.7–2.4)

## 2020-02-16 MED ORDER — AMIODARONE HCL 200 MG PO TABS
400.0000 mg | ORAL_TABLET | Freq: Two times a day (BID) | ORAL | Status: DC
  Administered 2020-02-16 – 2020-02-21 (×10): 400 mg via ORAL
  Filled 2020-02-16 (×10): qty 2

## 2020-02-16 MED ORDER — ACETAMINOPHEN-CODEINE #3 300-30 MG PO TABS
1.0000 | ORAL_TABLET | ORAL | Status: DC | PRN
  Administered 2020-02-19: 1 via ORAL
  Filled 2020-02-16 (×2): qty 1

## 2020-02-16 NOTE — Progress Notes (Signed)
SUBJECTIVE: Patient resting comfortably.  Denies shortness of breath or chest pain.  Continues to have the only complaint of his Foley being uncomfortable and painful with movement.   Vitals:   02/16/20 0459 02/16/20 0557 02/16/20 0751 02/16/20 1119  BP: 103/64  120/69 127/69  Pulse: 66  69 62  Resp: 20 18 16 17   Temp: 99 F (37.2 C)  98.8 F (37.1 C) 98.1 F (36.7 C)  TempSrc: Oral  Oral Oral  SpO2: 96%  97% 97%  Weight: 118.8 kg     Height:        Intake/Output Summary (Last 24 hours) at 02/16/2020 1230 Last data filed at 02/16/2020 0538 Gross per 24 hour  Intake 567.08 ml  Output 1520 ml  Net -952.92 ml    LABS: Basic Metabolic Panel: Recent Labs    02/15/20 0709 02/16/20 0332  NA 137 137  K 4.1 3.9  CL 105 105  CO2 19* 20*  GLUCOSE 125* 128*  BUN 53* 42*  CREATININE 2.50* 1.72*  CALCIUM 9.4 8.9  MG  --  2.4   Liver Function Tests: No results for input(s): AST, ALT, ALKPHOS, BILITOT, PROT, ALBUMIN in the last 72 hours. No results for input(s): LIPASE, AMYLASE in the last 72 hours. CBC: Recent Labs    02/15/20 0709 02/16/20 0332  WBC 11.3* 9.8  HGB 10.6* 10.3*  HCT 32.2* 31.0*  MCV 91.2 88.6  PLT 261 269   Cardiac Enzymes: No results for input(s): CKTOTAL, CKMB, CKMBINDEX, TROPONINI in the last 72 hours. BNP: Invalid input(s): POCBNP D-Dimer: No results for input(s): DDIMER in the last 72 hours. Hemoglobin A1C: No results for input(s): HGBA1C in the last 72 hours. Fasting Lipid Panel: No results for input(s): CHOL, HDL, LDLCALC, TRIG, CHOLHDL, LDLDIRECT in the last 72 hours. Thyroid Function Tests: Recent Labs    02/14/20 0457  TSH 2.051   Anemia Panel: Recent Labs    02/14/20 1214  VITAMINB12 232  FOLATE 7.4  FERRITIN 827*  TIBC 167*  IRON 29*  RETICCTPCT 1.8     PHYSICAL EXAM General: Well developed, well nourished, in no acute distress HEENT:  Normocephalic and atramatic Neck:  No JVD.  Lungs: Clear bilaterally to  auscultation and percussion. Heart: HRRR . Normal S1 and S2 without gallops or murmurs.  Abdomen: Bowel sounds are positive, abdomen soft and non-tender  Msk:  Back normal, normal gait. Normal strength and tone for age. Extremities: No clubbing, cyanosis or edema.   Neuro: Alert and oriented X 3. Psych:  Good affect, responds appropriately  TELEMETRY: Normal sinus rhythm.  62/BPM  ASSESSMENT AND PLAN: Patient presenting to the emergency room with hematuria and lightheadedness found to have a UTI and pulmonary embolus who then developed atrial fibrillation with rapid ventricular rate.Patient had remained in normal sinus but has converted back to atrial fibrillation and developed RVR last night.  Patient was initiated on amiodarone infusion and has such converted back to sinus rhythm again.  Plan to continue this infusion until tomorrow and then we will transition to oral dosing again.  Please continue current dosing of metoprolol tartrate 50 mg twice daily. Continue heparin infusion for pulmonary embolus and transition to Xarelto or Eliquis once cleared by Urology.We will continue to follow.  Principal Problem:   Complicated UTI (urinary tract infection) Active Problems:   Stroke ([place])   Seizures (H[facility])   Prostate cancer (HCC)   Hypotension   Hypertension   Type II diabetes mellitus with renal manifestations ([place])  Acute renal failure superimposed on stage 3a chronic kidney disease (HCC)   Chronic diastolic CHF (congestive heart failure) (HCC)   Atrial fibrillation with RVR ([place])    Adaline Sill, NP-C 02/16/2020 12:30 PM

## 2020-02-16 NOTE — Progress Notes (Signed)
ANTICOAGULATION CONSULT NOTE - Follow up [place] for Heparin Indication: pulmonary embolus  Allergies  Allergen Reactions  . Poison Oak Extract Rash  . Poison Ivy Extract Rash   Patient Measurements: Height: 6\' 1"  (185.4 cm) Weight: 119.5 kg (263 lb 6.4 oz) IBW/kg (Calculated) : 79.9 HEPARIN DW (KG): 106.1  Vital Signs: Temp: 99 F (37.2 C) (10/14 0459) Temp Source: Oral (10/14 0459) BP: 103/64 (10/14 0459) Pulse Rate: 66 (10/14 0459)  Labs: Recent Labs     0000 02/13/20 0817 02/13/20 1022 02/13/20 2043 02/14/20 0457 02/14/20 1214 02/14/20 2156 02/15/20 0709 02/15/20 1510 02/16/20 0332  HGB   < >   < >  --   --  10.8*  --   --  10.6*  --  10.3*  HCT  --    < >  --   --  32.2*  --   --  32.2*  --  31.0*  PLT  --    < >  --   --  275  --   --  261  --  269  APTT  --   --  38*  --   --   --   --   --   --   --   LABPROT  --   --  15.0  --   --   --   --   --   --   --   INR  --   --  1.2  --   --   --   --   --   --   --   HEPARINUNFRC  --   --   --   --   --  0.32   < > 0.49 0.45 0.20*  CREATININE  --    < >  --  1.70* 1.73*  --   --  2.50*  --  1.72*  TROPONINIHS  --   --   --  23* 17 14  --   --   --   --    < > = values in this interval not displayed.    Estimated Creatinine Clearance: 51.8 mL/min (A) (by C-G formula based on SCr of 1.72 mg/dL (H)).   Assessment: 74 yo male who presented to ED with hematuria and lightheadedness. Found to have UTI and pulmonary embolism and later developed Afib with RVR. Baseline labs noted. No anticoagulants PTA.  Pharmacy has been consulted for heparin dosing and monitoring for PE.  Patient has foley in placed and urine draining noted to be cherry red without clots  Hgb 12>12.3>10.8>10.6  10/12 1214 HL 0.32; therapeutic 10/12 2156 HL 0.23; subtherapeutic - rate increased to 1850 units/hr 10/13 0709 HL 0.49; therapeutic 10/13 1510 HL 0.45; therapeutic x2 10/14 0332 HL 0.20, SUBtherapeutic, CBC  stable  Goal of Therapy:  Heparin level 0.3-0.7 units/ml Monitor platelets by anticoagulation protocol: Yes   Plan:  HL trended down to SUBtherapeutic. Will increase heparin drip to 2000 units/hr and recheck HL in 8 hours.  Monitor HL and CBC with AM labs   Cardiology plan to transition to oral anticoagulation (Xarelto or Eliquis) once cleared by urology  Ena Dawley, PharmD 02/16/2020 5:32 AM

## 2020-02-16 NOTE — Evaluation (Signed)
Occupational Therapy Evaluation Patient Details Name: Timothy Maddox MRN: [id number] DOB: 06-05-1945 Today's Date: 02/16/2020    History of Present Illness Timothy Maddox is a 74 y.o. 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   Clinical Impression   Pt was seen for OT evaluation this date. Prior to hospital admission, pt was INDEP with self care ADLs/ADL mobility with no AD. Required some intermittent assist in recent weeks for LB dressing to thread catheter through pant leg (spouse was assisting). In addition, spouse performs most IADLs including cooking, cleaning and driving. Pt lives with spouse in one level apartment at [facility] with ramped entrance. Currently pt demonstrates impairments as described below (See OT problem list) which functionally limit his ability to perform ADL/self-care tasks. Pt currently requires MIN A for ADL transfers with RW and requires MAX A with LB Dressing.  Pt would benefit from skilled OT services to address noted impairments and functional limitations (see below for any additional details) in order to maximize safety and independence while minimizing falls risk and caregiver burden. Upon hospital discharge, recommend HHOT to maximize pt safety and return to functional independence during meaningful occupations of daily life.     Follow Up Recommendations  Home health OT;Supervision/Assistance - 24 hour    Equipment Recommendations  3 in 1 bedside commode    Recommendations for Other Services       Precautions / Restrictions Precautions Precautions: Fall Restrictions Weight Bearing Restrictions: No      Mobility Bed Mobility Overal bed mobility: Needs Assistance Bed Mobility: Supine to Sit     Supine to sit: Min assist        Transfers Overall transfer level: Needs assistance Equipment  used: Rolling walker (2 wheeled) Transfers: Sit to/from Stand Sit to Stand: Min assist;Supervision         General transfer comment: MIN A from lower surfaces such as standard EOB height, SUPV from elevated BSC over commode in restroom with use of grab bars. MIN verbal cues for safe hand placement with use of RW.    Balance Overall balance assessment: Needs assistance Sitting-balance support: Feet supported Sitting balance-Leahy Scale: Good       Standing balance-Leahy Scale: Fair Standing balance comment: Requires UE support and CGA with fxl mobility                           ADL either performed or assessed with clinical judgement   ADL Overall ADL's : Needs assistance/impaired                                       General ADL Comments: SETUP with seated UB ADLs, MAX A with seated LB ADLs d/t groin pain 2/2 chronic catheter, MIN A with ADL trasnfers with RW.     Vision Baseline Vision/History: Wears glasses Wears Glasses: Reading only Patient Visual Report: No change from baseline       Perception     Praxis      Pertinent Vitals/Pain Pain Assessment: 0-10 Pain Score: 8  Pain Location: catheter area hurts with mobility Pain Descriptors / Indicators: Sharp Pain Intervention(s): Limited activity within patient's tolerance;Monitored during session     Hand Dominance Right   Extremity/Trunk Assessment Upper  Extremity Assessment Upper Extremity Assessment: RUE deficits/detail;LUE deficits/detail RUE Deficits / Details: shld flexion to 2/3 range, h/o old scapula injury in football years ago.Marland Kitchen grip grossly 4-/5 LUE Deficits / Details: ROM WFL, MMT grossly 4-/5   Lower Extremity Assessment Lower Extremity Assessment: Defer to PT evaluation       Communication Communication Communication: No difficulties   Cognition Arousal/Alertness: Awake/alert Behavior During Therapy: WFL for tasks assessed/performed Overall Cognitive Status:  Within Functional Limits for tasks assessed                                 General Comments: oriented and appropriate, HOH so needs low and slow speech.   General Comments       Exercises Other Exercises Other Exercises: OT facilitates ed re: safety with use of RW, fall prevention considerations-use of call light, fall alarm in chair, role of OT, importance of OOB mobility to prevent PNA and muscle atrophy. Pt and significant other with good understanding.   Shoulder Instructions      Home Living Family/patient expects to be discharged to:: Private residence Living Arrangements: Spouse/significant other Available Help at Discharge: Friend(s);Available PRN/intermittently Type of Home: Apartment [employer] homes) Home Access: Elevator;Ramped entrance     Home Layout: One level     Bathroom Shower/Tub: Occupational psychologist:  (comfort height) Bathroom Accessibility: Yes How Accessible: Accessible via walker Home Equipment: Union - 2 wheels;Shower seat - built in;Grab bars - tub/shower          Prior Functioning/Environment Level of Independence: Needs assistance  Gait / Transfers Assistance Needed: fxl mobility with no AD in home or community. ADL's / Homemaking Assistance Needed: requires some assist from significant other to manage catheter bag in/out of pants. But able to do most BADLs himself. Significant other mostly drives, cooks and cleans, and gets groceries.   Comments: hasn't driven in 2-3 weeks d/t weakness        OT Problem List: Decreased strength;Decreased range of motion;Decreased activity tolerance;Impaired balance (sitting and/or standing);Decreased knowledge of use of DME or AE;Cardiopulmonary status limiting activity;Obesity      OT Treatment/Interventions: Self-care/ADL training;DME and/or AE instruction;Therapeutic activities;Balance training    OT Goals(Current goals can be found in the care plan section) Acute Rehab OT  Goals Patient Stated Goal: to get stronger and go home OT Goal Formulation: With patient Time For Goal Achievement: 2020-03-13 Potential to Achieve Goals: Good ADL Goals Pt Will Perform Grooming: with modified independence;standing Pt Will Perform Lower Body Dressing: with supervision;with adaptive equipment;sit to/from stand Pt Will Transfer to Toilet: with modified independence;ambulating;bedside commode;grab bars Pt Will Perform Toileting - Clothing Manipulation and hygiene: with supervision;sit to/from stand Pt/caregiver will Perform Home Exercise Program: Increased strength;Both right and left upper extremity;With Supervision (mindful of h/o R shoulder injury)  OT Frequency: Min 1X/week   Barriers to D/C:            Co-evaluation              AM-PAC OT "6 Clicks" Daily Activity     Outcome Measure Help from another person eating meals?: None Help from another person taking care of personal grooming?: A Little Help from another person toileting, which includes using toliet, bedpan, or urinal?: A Little Help from another person bathing (including washing, rinsing, drying)?: A Lot Help from another person to put on and taking off regular upper body clothing?: None Help from another person  to put on and taking off regular lower body clothing?: A Lot 6 Click Score: 18   End of Session Equipment Utilized During Treatment: Gait belt;Rolling walker Nurse Communication: Mobility status;Other (comment)  Activity Tolerance: Patient tolerated treatment well Patient left: in chair;with chair alarm set;with call bell/phone within reach  OT Visit Diagnosis: Unsteadiness on feet (R26.81);Muscle weakness (generalized) (M62.81)                Time: 5844-1712 OT Time Calculation (min): 49 min Charges:  OT General Charges $OT Visit: 1 Visit OT Evaluation $OT Eval Moderate Complexity: 1 Mod OT Treatments $Self Care/Home Management : 8-22 mins $Therapeutic Activity: 8-22  mins  Gerrianne Scale, MS, OTR/L ascom [phone] 02/16/20, 1:36 PM

## 2020-02-16 NOTE — Progress Notes (Signed)
Per review of tele, pts HR has been stable on day shift while off Amio gtt.

## 2020-02-16 NOTE — Progress Notes (Signed)
ANTICOAGULATION CONSULT NOTE - Follow up [place] for Heparin Indication: pulmonary embolus  Allergies  Allergen Reactions  . Poison Oak Extract Rash  . Poison Ivy Extract Rash   Patient Measurements: Height: 6\' 1"  (185.4 cm) Weight: 118.8 kg (262 lb) IBW/kg (Calculated) : 79.9 HEPARIN DW (KG): 106.1  Vital Signs: Temp: 98.1 F (36.7 C) (10/14 1119) Temp Source: Oral (10/14 1119) BP: 127/69 (10/14 1119) Pulse Rate: 62 (10/14 1119)  Labs: Recent Labs     0000 02/13/20 2043 02/13/20 2043 02/14/20 0457 02/14/20 1214 02/14/20 2156 02/15/20 0709 02/15/20 0709 02/15/20 1510 02/16/20 0332 02/16/20 1428  HGB   < >  --   --  10.8*  --   --  10.6*  --   --  10.3*  --   HCT  --   --   --  32.2*  --   --  32.2*  --   --  31.0*  --   PLT  --   --   --  275  --   --  261  --   --  269  --   HEPARINUNFRC  --   --   --   --  0.32   < > 0.49   < > 0.45 0.20* 0.36  CREATININE  --  1.70*   < > 1.73*  --   --  2.50*  --   --  1.72*  --   TROPONINIHS  --  23*  --  17 14  --   --   --   --   --   --    < > = values in this interval not displayed.    Estimated Creatinine Clearance: 51.7 mL/min (A) (by C-G formula based on SCr of 1.72 mg/dL (H)).   Assessment: 74 yo male who presented to ED with hematuria and lightheadedness. Found to have UTI and pulmonary embolism and later developed Afib with RVR. Baseline labs noted. No anticoagulants PTA.  Pharmacy has been consulted for heparin dosing and monitoring for PE.  Patient has foley in placed and urine draining noted to be cherry red without clots  Hgb 12>12.3>10.8>10.6  10/12 1214 HL 0.32; therapeutic 10/12 2156 HL 0.23; subtherapeutic - rate increased to 1850 units/hr 10/13 0709 HL 0.49; therapeutic 10/13 1510 HL 0.45; therapeutic x2 10/14 0332 HL 0.20, SUBtherapeutic, CBC stable. Will increase heparin drip to 2000 units/hr and recheck HL in 8 hours.  10/14 1428 HL 0.36,   Goal of Therapy:  Heparin level  0.3-0.7 units/ml Monitor platelets by anticoagulation protocol: Yes   Plan:  Heparin level is therapeutic. Will continue heparin drip at 2000 units/hr and recheck HL in 8 hours. CBC daily while on heparin.    Cardiology plan to transition to oral anticoagulation (Xarelto or Eliquis) once cleared by urology  Oswald Hillock, PharmD 02/16/2020 2:50 PM

## 2020-02-16 NOTE — Progress Notes (Signed)
ANTICOAGULATION CONSULT NOTE - Follow up [place] for Heparin Indication: pulmonary embolus  Allergies  Allergen Reactions  . Poison Oak Extract Rash  . Poison Ivy Extract Rash   Patient Measurements: Height: 6\' 1"  (185.4 cm) Weight: 118.8 kg (262 lb) IBW/kg (Calculated) : 79.9 HEPARIN DW (KG): 106.1  Vital Signs: Temp: 98.9 F (37.2 C) (10/14 1937) Temp Source: Oral (10/14 1937) BP: 130/76 (10/14 2235) Pulse Rate: 78 (10/14 2235)  Labs: Recent Labs     0000 02/14/20 0457 02/14/20 1214 02/14/20 2156 02/15/20 0709 02/15/20 1510 02/16/20 0332 02/16/20 1428 02/16/20 2217  HGB   < > 10.8*  --   --  10.6*  --  10.3*  --   --   HCT  --  32.2*  --   --  32.2*  --  31.0*  --   --   PLT  --  275  --   --  261  --  269  --   --   HEPARINUNFRC  --   --  0.32   < > 0.49   < > 0.20* 0.36 0.40  CREATININE  --  1.73*  --   --  2.50*  --  1.72*  --   --   TROPONINIHS  --  17 14  --   --   --   --   --   --    < > = values in this interval not displayed.    Estimated Creatinine Clearance: 51.7 mL/min (A) (by C-G formula based on SCr of 1.72 mg/dL (H)).   Assessment: 74 yo male who presented to ED with hematuria and lightheadedness. Found to have UTI and pulmonary embolism and later developed Afib with RVR. Baseline labs noted. No anticoagulants PTA.  Pharmacy has been consulted for heparin dosing and monitoring for PE.  Patient has foley in placed and urine draining noted to be cherry red without clots  Hgb 12>12.3>10.8>10.6  10/12 1214 HL 0.32; therapeutic 10/12 2156 HL 0.23; subtherapeutic - rate increased to 1850 units/hr 10/13 0709 HL 0.49; therapeutic 10/13 1510 HL 0.45; therapeutic x2 10/14 0332 HL 0.20, SUBtherapeutic, CBC stable. Will increase heparin drip to 2000 units/hr and recheck HL in 8 hours.  10/14 1428 HL 0.36,  10/14 2217 HL 0.40, therapeutic x 2  Goal of Therapy:  Heparin level 0.3-0.7 units/ml Monitor platelets by anticoagulation  protocol: Yes   Plan:  Heparin level is therapeutic. Will continue heparin drip at 2000 units/hr and recheck HL in am. CBC daily while on heparin.    Cardiology plan to transition to oral anticoagulation (Xarelto or Eliquis) once cleared by urology  Ena Dawley, PharmD 02/16/2020 11:59 PM

## 2020-02-16 NOTE — Care Management Important Message (Signed)
Important Message  Patient Details  Name: Timothy Maddox MRN: [id number] Date of Birth: Apr 06, 1946   Medicare Important Message Given:  Yes     Dannette Barbara 02/16/2020, 2:20 PM

## 2020-02-16 NOTE — Plan of Care (Signed)
  Problem: Health Behavior/Discharge Planning: Goal: Ability to manage health-related needs will improve Outcome: Progressing   Problem: Clinical Measurements: Goal: Ability to maintain clinical measurements within normal limits will improve Outcome: Progressing   Problem: Clinical Measurements: Goal: Respiratory complications will improve Outcome: Progressing   Problem: Nutrition: Goal: Adequate nutrition will be maintained Outcome: Progressing   Problem: Elimination: Goal: Will not experience complications related to bowel motility Outcome: Progressing

## 2020-02-16 NOTE — Progress Notes (Signed)
PROGRESS NOTE    Timothy Maddox  EQA:[id number] DOB: Jun 09, 1945 DOA: 02/13/2020 PCP: [name], MD   Brief Narrative: Taken from H&P Timothy Maddox is a 74 y.o. 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  Pt has a h/o prostate CA, urethral stricture. He is s/p of recent cystoscopy and foley placement by Dr. [name] on 02/07/20. On arrival he was hypotensive which improved with IV fluids, UA looks infected, COVID-19 negative, worsening renal function.  CT renal stone study was concerning for multiple mets involving lung, osseous mets and liver.  Moderate bilateral hydronephrosis to the level of UVJ bilaterally and multiple layering bladder stones surrounding the Foley catheter tip. CTA was obtained due to elevated D-dimer and came back positive for PE, without any CT evidence of right heart strain, patient was started on IV heparin. Developed new onset A. fib with RVR-started on Cardizem infusion, overnight converted back to sinus rhythm and Cardizem infusion was discontinued today.  Cardiology was also consulted.  Subjective: Pt still had pain in his penis when his Foley cath was moved.  Normal oral intake and BM.    Oncology consult, Dr. [name] to see pt today.   Assessment & Plan:   Principal Problem:   Complicated UTI (urinary tract infection) Active Problems:   Stroke ([place])   Seizures ([place])   Prostate cancer (HCC)   Hypotension   Hypertension   Type II diabetes mellitus with renal manifestations (HCC)   Acute renal failure superimposed on stage 3a chronic kidney disease (HCC)   Chronic diastolic CHF (congestive heart failure) (HCC)   Atrial fibrillation with RVR (HCC)  Complicated UTI/hydronephrosis/multiple bladder stones.  Recent cystoscopy.  Blood cultures negative so far.  Urinary cultures pos for Staphylococcus  epidermidis, questionable significance.   PLAN: --continue ceftriaxone as empiric tx for 5 days --continue Foley   Penile pain and likely Urethral injury.   After pulling on Foley catheter. -Informed urology. -Foley if needs to be replaced should be done by urology.  Prostate cancer with mets Patient was waiting for PET scan for concern of recurrence of bladder cancer.  CT renal stone study and CTA are concerning for multiple mets including lungs, liver and bones. PLAN: --Oncology consult today, Dr. [name] to see --plan for US-guided liver biopsy tomorrow (NPO MN, heparin gtt to stop 6 am 10/15, communicated to pt's nurse, stop time entered). -continue Nubeqa.  New onset A. fib with RVR.   Most likely secondary to PE.  Cardiology was consulted and he was started on Cardizem infusion.  --initiated amiodarone oral loading dose per cardiology.  --HR went up to 110-140's Afib evening 10/13, so started amiodarone gtt, per cardiology rec.  Amiodarone gtt was stopped early this morning because night-team felt pt's HR was already down to 60's.   PLAN: --cont amiodarone as oral 400 mg BID since infusion had stopped. -Continue with heparin infusion until 6 am 10/15, which then will be stopped ahead of liver biopsy. -Continue with metoprolol  Acute Pulmonary embolism.   CTA was done due to elevated D-dimer above 4000.  It was positive for PE.  Patient is high risk secondary to extensive malignancy. Started on heparin infusion. PLAN: --Continue with heparin infusion until 6 am 10/15, which then will be stopped ahead of liver biopsy. --will switch to Eliquis when no more procedure is needed  Anemia,  iron def Hemoglobin dropped to 10.8 from 12.3.  Most likely secondary to hematuria.  Anemia panel consistent with mild iron deficiency along with anemia of chronic disease.   PLAN: --Monitor Hgb in light of hematuria -Transfuse if below 7 --will discharge on iron supplement  History of CVA.   No  acute concern. -Continue Lipitor  History of seizure disorder. -Continue home dose of Keppra. -Continue with seizure precautions -As needed Ativan  Hypertension.  Blood pressure within goal today. -Continue metoprolol. -Holding home dose of spironolactone and Hyzaar. -Continue IV hydralazine as needed  AKI with CKD stage IIIa.   Baseline creatinine around 1.2-1.6.  Stable today around 1.73.  Patient appears dehydrated and hypotensive on admission, s/p MIVF -Continue to hold Cozaar and spironolactone. --d/c MIVF and encourage oral hydration.  Chronic diastolic heart failure.  Prior echo done in 2017 with normal EF and grade 1 diastolic dysfunction.  Patient appears euvolemic. PLAN: -Holding home dose of spironolactone. -Continue metoprolol  Type II diabetes mellitus with renal manifestations [facility]): Recent A1c 6.3, well controlled.  Patient taking Metformin at home. --BG has been within goal for inpatient, and pt hasn't required more than 1 or 2 units of insulin per day --no need for fingersticks   Objective: Vitals:   02/16/20 0557 02/16/20 0751 02/16/20 1119 02/16/20 1609  BP:  120/69 127/69 127/72  Pulse:  69 62 70  Resp: 18 16 17 16   Temp:  98.8 F (37.1 C) 98.1 F (36.7 C) 98.9 F (37.2 C)  TempSrc:  Oral Oral Oral  SpO2:  97% 97% 96%  Weight:      Height:        Intake/Output Summary (Last 24 hours) at 02/16/2020 1643 Last data filed at 02/16/2020 1401 Gross per 24 hour  Intake 807.08 ml  Output 1870 ml  Net -1062.92 ml   Filed Weights   02/14/20 1616 02/15/20 0424 02/16/20 0459  Weight: 120.7 kg 119.5 kg 118.8 kg    Examination: Constitutional: NAD, AAOx3 HEENT: conjunctivae and lids normal, EOMI, hard of hearing CV: RRR no M,R,G. Distal pulses +2.  No cyanosis.   RESP: CTA B/L over anterior, normal respiratory effort  GI: +BS, NTND, large abdomen Extremities: No effusions, edema in BLE SKIN: warm, dry and intact Neuro: II - XII grossly intact.   Sensation intact Psych: Normal mood and affect.    Foley cath present, outputting dark bloody urine.    DVT prophylaxis: Heparin infusion Code Status: Full Family Communication: updated wife at the bedside today Disposition Plan:  Status is: Inpatient  The patient is from: home Anticipated d/c is to: undetermined Anticipated d/c date is: 2-3 days Patient currently is not medically stable to d/c due to: still on heparin gtt for PE, hematuria, Afib RVR returned currently on amiodarone load, oncology planning liver biopsy.   Consultants:   Urology  Cardiology  Procedures:  Antimicrobials:  Ceftriaxone  Data Reviewed: I have personally reviewed following labs and imaging studies  CBC: Recent Labs  Lab 02/12/20 1758 02/13/20 0817 02/14/20 0457 02/15/20 0709 02/16/20 0332  WBC 10.1 9.7 10.4 11.3* 9.8  HGB 12.0* 12.3* 10.8* 10.6* 10.3*  HCT 35.8* 37.9* 32.2* 32.2* 31.0*  MCV 89.3 92.4 89.0 91.2 88.6  PLT 287 226 275 261 382   Basic Metabolic Panel: Recent Labs  Lab 02/13/20 0817 02/13/20 2043 02/14/20 0457 02/15/20 0709 02/16/20 0332  NA 135 139 136 137 137  K 5.4* 3.9 4.1 4.1 3.9  CL 104 101 104 105 105  CO2 15* 21* 19* 19* 20*  GLUCOSE 143* 141* 167* 125* 128*  BUN 56* 49* 48* 53* 42*  CREATININE 1.85* 1.70* 1.73* 2.50* 1.72*  CALCIUM 9.7 9.7 9.6 9.4 8.9  MG  --   --   --   --  2.4   GFR: Estimated Creatinine Clearance: 51.7 mL/min (A) (by C-G formula based on SCr of 1.72 mg/dL (H)). Liver Function Tests: No results for input(s): AST, ALT, ALKPHOS, BILITOT, PROT, ALBUMIN in the last 168 hours. No results for input(s): LIPASE, AMYLASE in the last 168 hours. No results for input(s): AMMONIA in the last 168 hours. Coagulation Profile: Recent Labs  Lab 02/13/20 1022  INR 1.2   Cardiac Enzymes: No results for input(s): CKTOTAL, CKMB, CKMBINDEX, TROPONINI in the last 168 hours. BNP (last 3 results) No results for input(s): PROBNP in the last 8760  hours. HbA1C: No results for input(s): HGBA1C in the last 72 hours. CBG: Recent Labs  Lab 02/14/20 2038 02/15/20 0803 02/15/20 1139 02/15/20 1648 02/15/20 2053  GLUCAP 137* 119* 145* 130* 163*   Lipid Profile: No results for input(s): CHOL, HDL, LDLCALC, TRIG, CHOLHDL, LDLDIRECT in the last 72 hours. Thyroid Function Tests: Recent Labs    02/14/20 0457  TSH 2.051   Anemia Panel: Recent Labs    02/14/20 1214  VITAMINB12 232  FOLATE 7.4  FERRITIN 827*  TIBC 167*  IRON 29*  RETICCTPCT 1.8   Sepsis Labs: Recent Labs  Lab 02/13/20 1022 02/13/20 1437  PROCALCITON 0.42  --   LATICACIDVEN 3.4* 2.0*    Recent Results (from the past 240 hour(s))  Urine culture     Status: Abnormal   Collection Time: 02/13/20  8:46 AM   Specimen: Urine, Catheterized  Result Value Ref Range Status   Specimen Description   Final    URINE, CATHETERIZED Performed at [facility], [address] [place], [place] [address]    Special Requests   Final    NONE Performed at [facility], [place], [place] [address]    Culture 50,000 COLONIES/mL STAPHYLOCOCCUS EPIDERMIDIS (A)  Final   Report Status 02/15/2020 FINAL  Final   Organism ID, Bacteria STAPHYLOCOCCUS EPIDERMIDIS (A)  Final      Susceptibility   Staphylococcus epidermidis - MIC*    CIPROFLOXACIN >=8 RESISTANT Resistant     GENTAMICIN <=0.5 SENSITIVE Sensitive     NITROFURANTOIN <=16 SENSITIVE Sensitive     OXACILLIN <=0.25 SENSITIVE Sensitive     TETRACYCLINE 2 SENSITIVE Sensitive     VANCOMYCIN 1 SENSITIVE Sensitive     TRIMETH/SULFA >=320 RESISTANT Resistant     CLINDAMYCIN >=8 RESISTANT Resistant     RIFAMPIN <=0.5 SENSITIVE Sensitive     Inducible Clindamycin NEGATIVE Sensitive     * 50,000 COLONIES/mL STAPHYLOCOCCUS EPIDERMIDIS  Resp Panel by RT PCR (RSV, Flu A&B, Covid) - Nasopharyngeal Swab     Status: None   Collection Time: 02/13/20  9:59 AM   Specimen: Nasopharyngeal Swab    Result Value Ref Range Status   SARS Coronavirus 2 by RT PCR NEGATIVE NEGATIVE Final    Comment: (NOTE) SARS-CoV-2 target nucleic acids are NOT DETECTED.  The SARS-CoV-2 RNA is generally detectable in upper respiratoy specimens during the acute phase of infection. The lowest concentration of SARS-CoV-2 viral copies this assay can detect is 131 copies/mL. A negative result does not preclude SARS-Cov-2 infection and should not be used as the sole basis for treatment or other patient management decisions. A  negative result may occur with  improper specimen collection/handling, submission of specimen other than nasopharyngeal swab, presence of viral mutation(s) within the areas targeted by this assay, and inadequate number of viral copies (<131 copies/mL). A negative result must be combined with clinical observations, patient history, and epidemiological information. The expected result is Negative.  Fact Sheet for Patients:  PinkCheek.be  Fact Sheet for Healthcare Providers:  GravelBags.it  This test is no t yet approved or cleared by the [place] FDA and  has been authorized for detection and/or diagnosis of SARS-CoV-2 by FDA under an Emergency Use Authorization (EUA). This EUA will remain  in effect (meaning this test can be used) for the duration of the COVID-19 declaration under Section 564(b)(1) of the Act, 21 U.S.C. section 360bbb-3(b)(1), unless the authorization is terminated or revoked sooner.     Influenza A by PCR NEGATIVE NEGATIVE Final   Influenza B by PCR NEGATIVE NEGATIVE Final    Comment: (NOTE) The Xpert Xpress SARS-CoV-2/FLU/RSV assay is intended as an aid in  the diagnosis of influenza from Nasopharyngeal swab specimens and  should not be used as a sole basis for treatment. Nasal washings and  aspirates are unacceptable for Xpert Xpress SARS-CoV-2/FLU/RSV  testing.  Fact Sheet for  Patients: PinkCheek.be  Fact Sheet for Healthcare Providers: GravelBags.it  This test is not yet approved or cleared by the [place] FDA and  has been authorized for detection and/or diagnosis of SARS-CoV-2 by  FDA under an Emergency Use Authorization (EUA). This EUA will remain  in effect (meaning this test can be used) for the duration of the  Covid-19 declaration under Section 564(b)(1) of the Act, 21  U.S.C. section 360bbb-3(b)(1), unless the authorization is  terminated or revoked.    Respiratory Syncytial Virus by PCR NEGATIVE NEGATIVE Final    Comment: (NOTE) Fact Sheet for Patients: PinkCheek.be  Fact Sheet for Healthcare Providers: GravelBags.it  This test is not yet approved or cleared by the [place] FDA and  has been authorized for detection and/or diagnosis of SARS-CoV-2 by  FDA under an Emergency Use Authorization (EUA). This EUA will remain  in effect (meaning this test can be used) for the duration of the  COVID-19 declaration under Section 564(b)(1) of the Act, 21 U.S.C.  section 360bbb-3(b)(1), unless the authorization is terminated or  revoked. Performed at [facility], [address] [place], [place] [address]   Culture, blood (Routine X 2) w Reflex to ID Panel     Status: None (Preliminary result)   Collection Time: 02/13/20 10:22 AM   Specimen: BLOOD  Result Value Ref Range Status   Specimen Description BLOOD LEFT HAND  Final   Special Requests   Final    BOTTLES DRAWN AEROBIC AND ANAEROBIC Blood Culture results may not be optimal due to an inadequate volume of blood received in culture bottles   Culture   Final    NO GROWTH 3 DAYS Performed at [facility], [address], [place], [place] [address]    Report Status PENDING  Incomplete  Culture, blood (Routine X 2) w Reflex to ID Panel     Status: None  (Preliminary result)   Collection Time: 02/13/20 10:30 AM   Specimen: BLOOD  Result Value Ref Range Status   Specimen Description BLOOD LEFT ARM  Final   Special Requests   Final    BOTTLES DRAWN AEROBIC AND ANAEROBIC Blood Culture adequate volume   Culture   Final    NO  GROWTH 3 DAYS Performed at [facility], [address], [place],  [address]    Report Status PENDING  Incomplete     Radiology Studies: No results found.  Scheduled Meds: . Chlorhexidine Gluconate Cloth  6 each Topical Daily  . darolutamide  600 mg Oral BID WC  . levETIRAcetam  500 mg Oral BID  . metoprolol tartrate  50 mg Oral BID  . rosuvastatin  5 mg Oral QHS   Continuous Infusions: . sodium chloride Stopped (02/14/20 1141)  . amiodarone Stopped (02/16/20 0556)  . cefTRIAXone (ROCEPHIN)  IV 2 g (02/16/20 7510)  . heparin 2,000 Units/hr (02/16/20 1257)     LOS: 3 days    Enzo Bi, MD [facility]  If 7PM-7AM, please contact night-coverage [email or web address]  02/16/2020, 4:43 PM

## 2020-02-16 NOTE — Progress Notes (Signed)
PT Cancellation Note  Patient Details Name: Timothy Maddox MRN: [id number] DOB: [birth year]   Cancelled Treatment:    Reason Eval/Treat Not Completed: Fatigue/lethargy limiting ability to participate (Evaluation attempted. Patient visibly fatigued, reports "not having any sleep in 24 hours".  Requests therapist allow him to rest and re-attempt at later time.  Will continue efforts at later time/date.)   Velencia Lenart H. Owens Shark, PT, DPT, NCS 02/16/20, 9:09 AM [phone]

## 2020-02-16 NOTE — Consult Note (Addendum)
Hematology/Oncology Consult note [facility] Telephone:([phone] [phone]  Patient Care Team: [name], MD as PCP - General (Internal Medicine) Abbie Sons, MD (Urology)   Name of the patient: Timothy Maddox  [id number]  1945/06/14   Date of visit: 02/16/20 REASON FOR COSULTATION:  Prostate cancer History of presenting illness-  74 y.o. male with PMH listed at below who presents to ER for evaluation of near syncope, lightheadedness upon standing, blood in the Foley catheter.  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  He was scheduled for an outpatient Axumin PET scan for further evaluation and then follow-up after the PET scan with me.  He called and rescheduled the PET scan due to feeling weak. Patient has gross hematuria, dysuria and follows up with Dr. [name]. 02/07/2020 patient had cystoscopy with urethral dilation Intraoperative findings include diffuse transurethral narrowing, fixed bladder neck, flexible cystoscopy was suboptimal visualization.  Patient has indwelling Foley catheter and reports pain around his Foley. patient was found to have white blood cell count 9.7 UA showed cloudy appearance, leukocyte, WBC 11-20, worsened renal function, urine culture showed 50,000 colonies/ml Staphylococcus epidermidis Patient was also found in atrial fibrillation, elevated D-dimer.  #02/13/2020, CT angio chest PE protocol showed acute pulmonary embolism, moderate embolism burden.  No CT evidence of right heart strain.  Mild global cardiomegaly.  Moderate coronary artery calcification.  Innumerable bilateral pulmonary masses and nodules as well as bilateral pathological hilar adenopathy and widespread sclerotic osseous metastasis in keeping with visceral and osseous metastasis disease related to patient's underlying prostate  cancer. 02/13/2020, CT renal stone study showed multiple new hypodense liver masses compatible with liver metastasis.  Mildly enlarged irregular and poorly marginated prostate directly contiguous with the bladder trigone, suggestive of recurrent locally advanced prostate cancer.  Moderate bilateral hydroureteronephrosis to the level of the UVJ bilaterally, probably due to bladder outlet obstruction.  Multiple layering bladder stones in the distended bladder.  Cholelithiasis.  Stable left adrenal adenoma. Heme-onc was consulted for further evaluation and management. Patient reports feeling weak and fatigued.  Pain around the Foley catheter whenever he changes position.  He has oxycodone ordered and he does not want to take this pain medication as this medication makes him confused.  Review of Systems  Constitutional: Positive for fatigue. Negative for appetite change, chills, fever and unexpected weight change.  HENT:   Negative for hearing loss and voice change.   Eyes: Negative for eye problems and icterus.  Respiratory: Negative for chest tightness, cough and shortness of breath.   Cardiovascular: Negative for chest pain and leg swelling.  Gastrointestinal: Negative for abdominal distention and abdominal pain.  Endocrine: Negative for hot flashes.  Genitourinary: Positive for dysuria and hematuria. Negative for difficulty urinating and frequency.   Musculoskeletal: Negative for arthralgias.  Skin: Negative for itching and rash.  Neurological: Negative for light-headedness and numbness.  Hematological: Negative for adenopathy. Does not bruise/bleed easily.  Psychiatric/Behavioral: Negative for confusion.    Allergies  Allergen Reactions  . Poison Oak Extract Rash  . Poison Ivy Extract Rash    Patient Active Problem List   Diagnosis Date Noted  . Hematuria 02/13/2020  . Hypotension 02/13/2020  . Complicated UTI (urinary tract infection) 02/13/2020  . Atrial fibrillation with RVR ([place])  02/13/2020  . Prediabetes   . Hypertension   . Type II diabetes mellitus with renal manifestations ([place])   . Acute  renal failure superimposed on stage 3a chronic kidney disease ([place])   . Chronic diastolic CHF (congestive heart failure) ([place])   . Stage 3a chronic kidney disease ([place]) 01/30/2020  . Androgen deprivation therapy 01/30/2020  . Encounter for antineoplastic chemotherapy 09/24/2018  . Weak urine stream 09/24/2018  . Rising PSA level 07/08/2018  . Prostate cancer ([place]) 07/08/2018  . Goals of care, counseling/discussion 12/31/2017  . Osteoarthritis of right knee 07/20/2017  . Seizure ([place]) 12/19/2015  . 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 Seizures ([place]) 12/19/2015  . AVF (arteriovenous fistula) ([place]) 11/16/2015  . ICH (intracerebral hemorrhage) ([place]) 11/16/2015  . Stroke [facility]) 11/13/2015     Past Medical History:  Diagnosis Date  . Anxiety   . Arthritis    lower back, shoulders  . AVM (arteriovenous malformation) brain   . Cancer [facility])    prostate- treated with radiation  . CVA (cerebral infarction) 11/2015  . Depression   . Diabetes mellitus without complication ([place])   . Dyspnea    with exertion  . HOH (hard of hearing)   . Hyperlipidemia   . Hypertension   . Pneumonia    1st grade  . Pre-diabetes   . Prediabetes   . Seizures ([place]) 12/2015  . Sleep apnea   . Stroke [facility])    speech impairment, no weakness     Past Surgical History:  Procedure Laterality Date  . BACK SURGERY  1999   disectomy  . BACK SURGERY     2 surgery- injury  . COLONOSCOPY W/ POLYPECTOMY    . COLONOSCOPY WITH PROPOFOL N/A 02/28/2019   Procedure: COLONOSCOPY WITH PROPOFOL;  Surgeon: Toledo, Benay Pike, MD;  Location: [facility] ENDOSCOPY;  Service: Gastroenterology;  Laterality: N/A;  . CYSTOSCOPY WITH URETHRAL DILATATION Bilateral 02/07/2020   Procedure: CYSTOSCOPY WITH URETHRAL DILATATION;  Surgeon: Abbie Sons, MD;  Location: [facility] ORS;  Service: Urology;   Laterality: Bilateral;  . HAMMER TOE SURGERY Bilateral   . INGUINAL HERNIA REPAIR Right    per patient when he was in 1st grade  . IR GENERIC HISTORICAL  12/05/2015   IR RADIOLOGIST EVAL & MGMT 12/05/2015 [facility]  . IR GENERIC HISTORICAL  01/31/2016   IR RADIOLOGIST EVAL & MGMT 01/31/2016 [facility]  . IR GENERIC HISTORICAL  02/20/2016   IR ANGIO INTRA EXTRACRAN SEL COM CAROTID INNOMINATE UNI L MOD SED 02/20/2016 [name], MD [facility]  . IR GENERIC HISTORICAL  02/20/2016   IR 3D INDEPENDENT WKST 02/20/2016 [name], MD [facility]  . KNEE SURGERY Bilateral 2015  . RADIOLOGY WITH ANESTHESIA N/A 02/20/2016   Procedure: EMBOLIZATION;  Surgeon: [name], MD;  Location: [place];  Service: Radiology;  Laterality: N/A;  . TOE SURGERY Left 2017   TOE NAIL REMOVAL  . TOTAL KNEE ARTHROPLASTY Right 07/20/2017   Procedure: TOTAL KNEE ARTHROPLASTY;  Surgeon: Lovell Sheehan, MD;  Location: [facility] ORS;  Service: Orthopedics;  Laterality: Right;    Social History   Socioeconomic History  . Marital status: Married    Spouse name: Not on file  . Number of children: Not on file  . Years of education: Not on file  . Highest education level: Not on file  Occupational History  . Occupation: retired Curator  Tobacco Use  . Smoking status: Former Smoker    Packs/day: 1.00    Years: 2.00    Pack years: 2.00    Types: Cigarettes    Quit date: 05/05/1966    Years since  quitting: 53.8  . Smokeless tobacco: Never Used  . Tobacco comment: quit age 26  Vaping Use  . Vaping Use: Never used  Substance and Sexual Activity  . Alcohol use: Not Currently  . Drug use: No  . Sexual activity: Not Currently  Other Topics Concern  . Not on file  Social History Narrative  . Not on file   Social Determinants of Health   Financial Resource Strain:   . Difficulty of Paying Living Expenses: Not on file  Food Insecurity:   . Worried About Charity fundraiser in the Last Year:  Not on file  . Ran Out of Food in the Last Year: Not on file  Transportation Needs:   . Lack of Transportation (Medical): Not on file  . Lack of Transportation (Non-Medical): Not on file  Physical Activity:   . Days of Exercise per Week: Not on file  . Minutes of Exercise per Session: Not on file  Stress:   . Feeling of Stress : Not on file  Social Connections:   . Frequency of Communication with Friends and Family: Not on file  . Frequency of Social Gatherings with Friends and Family: Not on file  . Attends Religious Services: Not on file  . Active Member of Clubs or Organizations: Not on file  . Attends Archivist Meetings: Not on file  . Marital Status: Not on file  Intimate Partner Violence:   . Fear of Current or Ex-Partner: Not on file  . Emotionally Abused: Not on file  . Physically Abused: Not on file  . Sexually Abused: Not on file     Family History  Problem Relation Age of Onset  . Heart failure Father   . Diabetes Father   . Hypertension Father   . Alzheimer's disease Father 35  . Other Mother 41       homicide  . Alcohol abuse Mother   . Pancreatic cancer Sister   . Alcohol abuse Sister   . Diabetes Brother   . Diabetes Paternal Aunt   . Diabetes Paternal Grandmother   . Diabetes Paternal Grandfather      Current Facility-Administered Medications:  .  0.9 %  sodium chloride infusion, , Intravenous, Continuous, Ivor Costa, MD, Stopped at 02/14/20 1141 .  acetaminophen (TYLENOL) tablet 650 mg, 650 mg, Oral, Q6H PRN, Ivor Costa, MD .  [EXPIRED] amiodarone (NEXTERONE PREMIX) 360-4.14 MG/200ML-% (1.8 mg/mL) IV infusion, 60 mg/hr, Intravenous, Continuous, Stopping Infusion hung by another clincian at 02/16/20 0046 **FOLLOWED BY** amiodarone (NEXTERONE PREMIX) 360-4.14 MG/200ML-% (1.8 mg/mL) IV infusion, 30 mg/hr, Intravenous, Continuous, Enzo Bi, MD, Stopped at 02/16/20 0556 .  cefTRIAXone (ROCEPHIN) 2 g in sodium chloride 0.9 % 100 mL IVPB, 2 g,  Intravenous, Q24H, Oswald Hillock, RPH, Last Rate: 200 mL/hr at 02/16/20 0812, 2 g at 02/16/20 0812 .  Chlorhexidine Gluconate Cloth 2 % PADS 6 each, 6 each, Topical, Daily, Ivor Costa, MD, 6 each at 02/16/20 1016 .  darolutamide (NUBEQA) tablet 600 mg, 600 mg, Oral, BID WC, Ivor Costa, MD .  [COMPLETED] heparin bolus via infusion 5,000 Units, 5,000 Units, Intravenous, Once, 5,000 Units at 02/14/20 0254 **FOLLOWED BY** heparin ADULT infusion 100 units/mL (25000 units/272mL sodium chloride 0.45%), 2,000 Units/hr, Intravenous, Continuous, Hall, Scott A, RPH, Last Rate: 20 mL/hr at 02/16/20 1257, 2,000 Units/hr at 02/16/20 1257 .  hydrALAZINE (APRESOLINE) injection 5 mg, 5 mg, Intravenous, Q2H PRN, Ivor Costa, MD .  levETIRAcetam (KEPPRA) tablet 500 mg, 500 mg, Oral,  BID, Ivor Costa, MD, 500 mg at 02/16/20 8502 .  LORazepam (ATIVAN) injection 0.5 mg, 0.5 mg, Intravenous, Q12H PRN, Ivor Costa, MD, 0.5 mg at 02/13/20 1431 .  LORazepam (ATIVAN) injection 0.5-1 mg, 0.5-1 mg, Intravenous, Q2H PRN, Ivor Costa, MD .  metoprolol tartrate (LOPRESSOR) tablet 50 mg, 50 mg, Oral, BID, Ivor Costa, MD, 50 mg at 02/16/20 0804 .  oxyCODONE-acetaminophen (PERCOCET/ROXICET) 5-325 MG per tablet 1 tablet, 1 tablet, Oral, Q6H PRN, Ivor Costa, MD, 1 tablet at 02/14/20 2120 .  rosuvastatin (CRESTOR) tablet 5 mg, 5 mg, Oral, QHS, Ivor Costa, MD, 5 mg at 02/15/20 2051 .  sodium chloride flush (NS) 0.9 % injection 3 mL, 3 mL, Intravenous, PRN, Lorella Nimrod, MD, 3 mL at 02/14/20 2314   Physical exam:  Vitals:   02/16/20 0459 02/16/20 0557 02/16/20 0751 02/16/20 1119  BP: 103/64  120/69 127/69  Pulse: 66  69 62  Resp: 20 18 16 17   Temp: 99 F (37.2 C)  98.8 F (37.1 C) 98.1 F (36.7 C)  TempSrc: Oral  Oral Oral  SpO2: 96%  97% 97%  Weight: 262 lb (118.8 kg)     Height:       Physical Exam Constitutional:      General: He is not in acute distress.    Appearance: He is obese. He is not diaphoretic.  HENT:      Head: Normocephalic and atraumatic.     Nose: Nose normal.     Mouth/Throat:     Pharynx: No oropharyngeal exudate.  Eyes:     General: No scleral icterus.    Pupils: Pupils are equal, round, and reactive to light.  Cardiovascular:     Rate and Rhythm: Normal rate and regular rhythm.     Heart sounds: No murmur heard.   Pulmonary:     Effort: Pulmonary effort is normal. No respiratory distress.     Breath sounds: No rales.  Chest:     Chest wall: No tenderness.  Abdominal:     General: There is no distension.     Palpations: Abdomen is soft.     Tenderness: There is no abdominal tenderness.  Genitourinary:    Comments: + foley Musculoskeletal:        General: Normal range of motion.     Cervical back: Normal range of motion and neck supple.  Skin:    General: Skin is warm and dry.     Findings: No erythema.  Neurological:     Mental Status: He is alert and oriented to person, place, and time.     Cranial Nerves: No cranial nerve deficit.     Motor: No abnormal muscle tone.     Coordination: Coordination normal.  Psychiatric:        Mood and Affect: Affect normal.         CMP Latest Ref Rng & Units 02/16/2020  Glucose 70 - 99 mg/dL 128(H)  BUN 8 - 23 mg/dL 42(H)  Creatinine 0.61 - 1.24 mg/dL 1.72(H)  Sodium 135 - 145 mmol/L 137  Potassium 3.5 - 5.1 mmol/L 3.9  Chloride 98 - 111 mmol/L 105  CO2 22 - 32 mmol/L 20(L)  Calcium 8.9 - 10.3 mg/dL 8.9  Total Protein 6.5 - 8.1 g/dL -  Total Bilirubin 0.3 - 1.2 mg/dL -  Alkaline Phos 38 - 126 U/L -  AST 15 - 41 U/L -  ALT 0 - 44 U/L -   CBC Latest Ref Rng & Units 02/16/2020  WBC 4.0 - 10.5 K/uL 9.8  Hemoglobin 13.0 - 17.0 g/dL 10.3(L)  Hematocrit 39 - 52 % 31.0(L)  Platelets 150 - 400 K/uL 269    RADIOGRAPHIC STUDIES: I have personally reviewed the radiological images as listed and agreed with the findings in the report. CT ANGIO CHEST PE W OR WO CONTRAST  Result Date: 02/13/2020 CLINICAL DATA:  Atrial  fibrillation, elevated D-dimer, prostate cancer EXAM: CT ANGIOGRAPHY CHEST WITH CONTRAST TECHNIQUE: Multidetector CT imaging of the chest was performed using the standard protocol during bolus administration of intravenous contrast. Multiplanar CT image reconstructions and MIPs were obtained to evaluate the vascular anatomy. CONTRAST:  83mL OMNIPAQUE IOHEXOL 350 MG/ML SOLN COMPARISON:  None. FINDINGS: Cardiovascular: There is adequate opacification of the pulmonary arterial tree. There are multiple branching central intraluminal filling defects identified within the right upper and right lower lobar pulmonary arteries as well as multiple segmental pulmonary arteries of the left lower lobe in keeping with acute pulmonary embolism. The central pulmonary arteries are enlarged in keeping with changes of pulmonary arterial hypertension. However, there is no CT evidence of right heart strain with the right to left ventricular ratio within normal limits. Moderate coronary artery calcification. Cardiac size is mildly enlarged. Moderate calcification of the mitral valve annulus. No pericardial effusion. The thoracic aorta is of normal caliber. Mild atherosclerotic calcification is seen within the aortic arch and descending thoracic aorta. Mediastinum/Nodes: There is pathologic bilateral hilar adenopathy. Index lymph node within the right hilum measures 16 mm x 25 mm at axial image # 42/4. Thyroid unremarkable. Esophagus unremarkable. Lungs/Pleura: Multiple bilateral pulmonary masses are identified most in keeping with pulmonary metastatic disease in this patient with a known history of prostate cancer. Index lesion within the right lower lobe measures 3.5 x 5.2 cm at axial image # 62/6. At least 50 nodules are seen scattered throughout the lungs bilaterally. No pneumothorax or pleural effusion. The central airways are widely patent. Upper Abdomen: Unremarkable Musculoskeletal: Multiple sclerotic metastases are identified  within the visualized axial skeleton involving the scapula bilaterally, the sternum, the thoracic spine, and multiple ribs bilaterally. No pathologic fracture. Review of the MIP images confirms the above findings. IMPRESSION: Acute pulmonary embolism. Moderate embolic burden. No CT evidence of right heart strain. Moderate coronary artery calcification.  Mild global cardiomegaly. Innumerable bilateral pulmonary masses and nodules as well as bilateral pathologic hilar adenopathy and widespread sclerotic osseous metastases in keeping with visceral and osseous metastatic disease related to the patient's underlying prostate cancer. Aortic Atherosclerosis (ICD10-I70.0). Electronically Signed   By: Fidela Salisbury MD   On: 02/13/2020 23:27   US Venous Img Lower Bilateral (DVT)  Result Date: 02/14/2020 CLINICAL DATA:  Pulmonary embolus EXAM: LEFT LOWER EXTREMITY VENOUS DOPPLER ULTRASOUND TECHNIQUE: Gray-scale sonography with graded compression, as well as color Doppler and duplex ultrasound were performed to evaluate the lower extremity deep venous systems from the level of the common femoral vein and including the common femoral, femoral, profunda femoral, popliteal and calf veins including the posterior tibial, peroneal and gastrocnemius veins when visible. The superficial great saphenous vein was also interrogated. Spectral Doppler was utilized to evaluate flow at rest and with distal augmentation maneuvers in the common femoral, femoral and popliteal veins. COMPARISON:  None. FINDINGS: Contralateral Common Femoral Vein: Respiratory phasicity is normal and symmetric with the symptomatic side. No evidence of thrombus. Normal compressibility. Common Femoral Vein: No evidence of thrombus. Normal compressibility, respiratory phasicity and response to augmentation. Saphenofemoral Junction: No evidence of thrombus. Normal compressibility  and flow on color Doppler imaging. Profunda Femoral Vein: No evidence of thrombus.  Normal compressibility and flow on color Doppler imaging. Femoral Vein: No evidence of thrombus. Normal compressibility, respiratory phasicity and response to augmentation. Popliteal Vein: No evidence of thrombus. Normal compressibility, respiratory phasicity and response to augmentation. Calf Veins: There is nonocclusive thrombus within the left peroneal veins. Superficial Great Saphenous Vein: No evidence of thrombus. Normal compressibility. Venous Reflux:  None. Other Findings:  None. IMPRESSION: Nonocclusive thrombus within the left peroneal veins. Electronically Signed   By: Ulyses Jarred M.D.   On: 02/14/2020 01:36   DG OR UROLOGY CYSTO IMAGE ([facility] ONLY)  Result Date: 02/07/2020 There is no interpretation for this exam.  This order is for images obtained during a surgical procedure.  Please See "Surgeries" Tab for more information regarding the procedure.   ECHOCARDIOGRAM COMPLETE  Result Date: 02/14/2020    ECHOCARDIOGRAM REPORT   Patient Name:   AIZEN DUVAL Date of Exam: 02/14/2020 Medical Rec #:  [id number]       Height:       73.0 in Accession #:    [id number]      Weight:       270.0 lb Date of Birth:  13-Jul-1945        BSA:          2.444 m Patient Age:    69 years        BP:           132/71 mmHg Patient Gender: M               HR:           70 bpm. Exam Location:  [facility] Procedure: 2D Echo, Color Doppler, Cardiac Doppler and Intracardiac            Opacification Agent Indications:     I48.91 Atrial Fibrillation  History:         Patient has prior history of Echocardiogram examinations.                  Stroke; Risk Factors:Sleep Apnea, Hypertension, Dyslipidemia                  and Diabetes.  Sonographer:     Charmayne Sheer RDCS (AE) Referring Phys:  [id number] [place] Diagnosing Phys: Neoma Laming MD  Sonographer Comments: Technically difficult study due to poor echo windows. Image acquisition challenging due to patient body habitus. IMPRESSIONS  1. Left ventricular ejection fraction, by  estimation, is 60 to 65%. The left ventricle has normal function. The left ventricle has no regional wall motion abnormalities. There is mild left ventricular hypertrophy. Left ventricular diastolic parameters are consistent with Grade I diastolic dysfunction (impaired relaxation).  2. Right ventricular systolic function is normal. The right ventricular size is normal.  3. Left atrial size was mildly dilated.  4. Right atrial size was mildly dilated.  5. The mitral valve is normal in structure. No evidence of mitral valve regurgitation. No evidence of mitral stenosis.  6. The aortic valve is normal in structure. Aortic valve regurgitation is not visualized. Mild aortic valve sclerosis is present, with no evidence of aortic valve stenosis.  7. The inferior vena cava is normal in size with greater than 50% respiratory variability, suggesting right atrial pressure of 3 mmHg. FINDINGS  Left Ventricle: Left ventricular ejection fraction, by estimation, is 60 to 65%. The left ventricle has normal function. The left ventricle has no regional wall  motion abnormalities. Definity contrast agent was given IV to delineate the left ventricular  endocardial borders. The left ventricular internal cavity size was normal in size. There is mild left ventricular hypertrophy. Left ventricular diastolic parameters are consistent with Grade I diastolic dysfunction (impaired relaxation). Right Ventricle: The right ventricular size is normal. No increase in right ventricular wall thickness. Right ventricular systolic function is normal. Left Atrium: Left atrial size was mildly dilated. Right Atrium: Right atrial size was mildly dilated. Pericardium: There is no evidence of pericardial effusion. Mitral Valve: The mitral valve is normal in structure. No evidence of mitral valve regurgitation. No evidence of mitral valve stenosis. MV peak gradient, 6.0 mmHg. The mean mitral valve gradient is 2.0 mmHg. Tricuspid Valve: The tricuspid valve is  normal in structure. Tricuspid valve regurgitation is not demonstrated. No evidence of tricuspid stenosis. Aortic Valve: The aortic valve is normal in structure. Aortic valve regurgitation is not visualized. Mild aortic valve sclerosis is present, with no evidence of aortic valve stenosis. Aortic valve mean gradient measures 3.0 mmHg. Aortic valve peak gradient measures 7.3 mmHg. Pulmonic Valve: The pulmonic valve was normal in structure. Pulmonic valve regurgitation is not visualized. No evidence of pulmonic stenosis. Aorta: The aortic root is normal in size and structure. Venous: The inferior vena cava is normal in size with greater than 50% respiratory variability, suggesting right atrial pressure of 3 mmHg. IAS/Shunts: No atrial level shunt detected by color flow Doppler.   LV Volumes (MOD) LV vol d, MOD A4C: 91.4 ml Diastology LV vol s, MOD A4C: 26.8 ml LV e' medial:    8.81 cm/s LV SV MOD A4C:     91.4 ml LV E/e' medial:  7.1                            LV e' lateral:   12.40 cm/s                            LV E/e' lateral: 5.0  AORTIC VALVE AV Vmax:           135.00 cm/s AV Vmean:          84.100 cm/s AV VTI:            0.227 m AV Peak Grad:      7.3 mmHg AV Mean Grad:      3.0 mmHg LVOT Vmax:         84.20 cm/s LVOT Vmean:        56.100 cm/s LVOT VTI:          0.108 m LVOT/AV VTI ratio: 0.48 MITRAL VALVE MV Area (PHT): 4.49 cm    SHUNTS MV Peak grad:  6.0 mmHg    Systemic VTI: 0.11 m MV Mean grad:  2.0 mmHg MV Vmax:       1.22 m/s MV Vmean:      64.5 cm/s MV Decel Time: 169 msec MV E velocity: 62.60 cm/s MV A velocity: 85.30 cm/s MV E/A ratio:  0.73 Neoma Laming MD Electronically signed by Neoma Laming MD Signature Date/Time: 02/14/2020/4:30:36 PM    Final    CT Renal Stone Study  Result Date: 02/13/2020 CLINICAL DATA:  Prostate cancer. Pink colored urine. Hypotension. Flank pain. EXAM: CT ABDOMEN AND PELVIS WITHOUT CONTRAST TECHNIQUE: Multidetector CT imaging of the abdomen and pelvis was performed  following the standard protocol without IV contrast. COMPARISON:  12/23/2017 CT  abdomen/pelvis. FINDINGS: Lower chest: Numerous (greater than 20) solid pulmonary nodules and masses at both lung bases, largest 5.2 cm in the basilar right lower lobe (series 4/image 27) and 5.5 cm in the basilar left lower lobe (series 4/image 27), all new. Atherosclerosis. Hepatobiliary: Multiple (at least 5) hypodense liver masses scattered throughout the liver, largest 3.8 cm in the segment 6 right liver lobe (series 2/image 36) and 2.0 cm in the superior right liver (series 2/image 22), all new. Cholelithiasis. No gallbladder wall thickening or pericholecystic fluid. No biliary ductal dilatation. Pancreas: Normal, with no mass or duct dilation. Spleen: Normal size. No mass. Adrenals/Urinary Tract: Normal right adrenal. Left adrenal 1.5 cm nodule with density 17 HU, stable, most compatible with an adenoma. Moderate bilateral hydroureteronephrosis to the level of the ureterovesical junctions bilaterally. No renal stones. No ureteral stones. Simple 3.7 cm posterior lower left renal cyst. No additional contour deforming renal lesions. Multiple layering stones in distended bladder, largest 19 mm. Chronic mild diffuse bladder wall thickening is unchanged. Foley catheter in place with balloon in the dependent bladder. Stomach/Bowel: Normal non-distended stomach. Normal caliber small bowel with no small bowel wall thickening. Normal appendix. Normal large bowel with no diverticulosis, large bowel wall thickening or pericolonic fat stranding. Vascular/Lymphatic: Atherosclerotic nonaneurysmal abdominal aorta. No pathologically enlarged lymph nodes in the abdomen or pelvis. Reproductive: Newly mildly enlarged, irregular and poorly marginated prostate, directly contiguous with the bladder trigone. Fiducial markers noted in the prostate as before. Other: No pneumoperitoneum, ascites or focal fluid collection. Solid subcutaneous 2.9 cm lateral  right chest wall mass (series 2/image 29), new. Similar solid subcutaneous 4.4 cm left gluteal mass (series 2/image 63). Small fat containing umbilical hernia is stable. Musculoskeletal: Numerous new sclerotic osseous lesions throughout the visualized skeleton including multiple lower ribs bilaterally, multiple thoracolumbar vertebrae largest at T11 and throughout the bilateral pelvic girdle, most prominent in the left iliac crest (series 2/image 60). Marked lumbar spondylosis. IMPRESSION: 1. Numerous new solid pulmonary nodules and masses at both lung bases, largest 5.2 cm in the basilar right lower lobe, compatible with pulmonary metastases. 2. New widespread sclerotic osseous metastases throughout the visualized skeleton as detailed. 3. Multiple new hypodense liver masses compatible with liver metastases. 4. Newly mildly enlarged, irregular and poorly marginated prostate, directly contiguous with the bladder trigone, suggestive of recurrent locally advanced prostate cancer. 5. Moderate bilateral hydroureteronephrosis to the level of the UVJ bilaterally, probably due to bladder outlet obstruction despite the well-positioned Foley catheter in the bladder. 6. Multiple layering bladder stones in the distended bladder. 7. Cholelithiasis. 8. Stable left adrenal adenoma. 9. Aortic Atherosclerosis (ICD10-I70.0). Electronically Signed   By: Ilona Sorrel M.D.   On: 02/13/2020 10:06    Assessment and plan-   #Castration resistant metastatic prostate cancer He previously had castration resistant biochemical recurrence with no measurable disease and PSA has responded very well to darolutamide 600 mg twice daily until most recently images showed pulmonary nodules and bone lesions suspecting for disease progression. CT images were independently reviewed by me and discussed with patient, this is consistent with rapid progression to visceral metastasis and osseous metastasis.  I recommend to proceed with ultrasound-guided  liver biopsy to confirm distant metastasis pathology.  Most likely he will need to have docetaxel chemotherapy treatments given the extremely high tumor burden.  Discussed with patient any agrees with the plan.  Communicated with interventional radiology.  Per Dr.Suttle, will obtain ultrasound right upper quadrant limited tomorrow a.m. and IR will review the images and  decide whether patient will be able to proceed with ultrasound guided biopsy.  N.p.o after midnight, anticoagulation to be held 2 hours prior to the ultrasound. Communicated with hospitalist team  #New onset of atrial fibrillation, new acute pulmonary embolism due to cancer I agree with continue heparin drip at this point.  Once he finishes necessary procedures, he can be switched to Eliquis   #Anemia, hemoglobin 10.8, likely secondary to chronic hematuria.  Monitor hemoglobin closely.  Transfuse PRBC to keep hemoglobin above 7. #History of seizure, continue home dose of Keppra.  Seizure precautions.  Avoid tramadol #Ureter injury after pulling Foley catheter.  Appreciate urology input. For pain control, he refuses oxycodone and he agrees to try Tylenol 3.  Orders are placed.   Thank you for allowing me to participate in the care of this patient.     Earlie Server, MD, PhD Hematology Oncology [facility] at [facility] [address] Pager- [phone] 02/16/2020

## 2020-02-16 NOTE — Progress Notes (Signed)
PT Cancellation Note  Patient Details Name: Timothy Maddox MRN: [id number] DOB: 1945-08-05   Cancelled Treatment:    Reason Eval/Treat Not Completed: Fatigue/lethargy limiting ability to participate (Evaluation re-attempted.  Patient, again, endorsing fatigue and requesting therapist re-attempt at later time.  Wife at bedside; endorses patient has been up to chair and ambulating to/from bathroom earlier today.  Will plan for re-attempt next date.)   Marin Wisner H. Owens Shark, PT, DPT, NCS 02/16/20, 8:51 PM [phone]

## 2020-02-16 NOTE — Plan of Care (Signed)
  Problem: Cardiac: Goal: Ability to achieve and maintain adequate cardiopulmonary perfusion will improve Outcome: Progressing HR within target rate.  Amiodarone IV discontinued as ordered.

## 2020-02-17 ENCOUNTER — Inpatient Hospital Stay: Payer: Medicare HMO

## 2020-02-17 DIAGNOSIS — R319 Hematuria, unspecified: Secondary | ICD-10-CM

## 2020-02-17 DIAGNOSIS — I2699 Other pulmonary embolism without acute cor pulmonale: Secondary | ICD-10-CM | POA: Diagnosis not present

## 2020-02-17 DIAGNOSIS — C61 Malignant neoplasm of prostate: Secondary | ICD-10-CM | POA: Diagnosis not present

## 2020-02-17 DIAGNOSIS — Z7189 Other specified counseling: Secondary | ICD-10-CM

## 2020-02-17 LAB — BASIC METABOLIC PANEL
Anion gap: 11 (ref 5–15)
BUN: 40 mg/dL — ABNORMAL HIGH (ref 8–23)
CO2: 20 mmol/L — ABNORMAL LOW (ref 22–32)
Calcium: 9.1 mg/dL (ref 8.9–10.3)
Chloride: 106 mmol/L (ref 98–111)
Creatinine, Ser: 1.93 mg/dL — ABNORMAL HIGH (ref 0.61–1.24)
GFR, Estimated: 34 mL/min — ABNORMAL LOW (ref >60)
Glucose, Bld: 140 mg/dL — ABNORMAL HIGH (ref 70–99)
Potassium: 4 mmol/L (ref 3.5–5.1)
Sodium: 137 mmol/L (ref 135–145)

## 2020-02-17 LAB — CBC
HCT: 29.2 % — ABNORMAL LOW (ref 39.0–52.0)
Hemoglobin: 10 g/dL — ABNORMAL LOW (ref 13.0–17.0)
MCH: 30.3 pg (ref 26.0–34.0)
MCHC: 34.2 g/dL (ref 30.0–36.0)
MCV: 88.5 fL (ref 80.0–100.0)
Platelets: 292 10*3/uL (ref 150–400)
RBC: 3.3 MIL/uL — ABNORMAL LOW (ref 4.22–5.81)
RDW: 14.5 % (ref 11.5–15.5)
WBC: 10.3 10*3/uL (ref 4.0–10.5)
nRBC: 0 % (ref 0.0–0.2)

## 2020-02-17 LAB — PROTIME-INR
INR: 1.2 (ref 0.8–1.2)
Prothrombin Time: 14.3 seconds (ref 11.4–15.2)

## 2020-02-17 LAB — MAGNESIUM: Magnesium: 2.2 mg/dL (ref 1.7–2.4)

## 2020-02-17 LAB — HEPARIN LEVEL (UNFRACTIONATED): Heparin Unfractionated: 0.3 IU/mL (ref 0.30–0.70)

## 2020-02-17 MED ORDER — FERROUS SULFATE 325 (65 FE) MG PO TABS
325.0000 mg | ORAL_TABLET | Freq: Two times a day (BID) | ORAL | Status: DC
  Administered 2020-02-17 – 2020-02-21 (×9): 325 mg via ORAL
  Filled 2020-02-17 (×8): qty 1

## 2020-02-17 MED ORDER — CEPHALEXIN 500 MG PO CAPS
500.0000 mg | ORAL_CAPSULE | Freq: Four times a day (QID) | ORAL | Status: DC
  Administered 2020-02-18 – 2020-02-19 (×6): 500 mg via ORAL
  Filled 2020-02-17 (×6): qty 1

## 2020-02-17 MED ORDER — MIDAZOLAM HCL 2 MG/2ML IJ SOLN
INTRAMUSCULAR | Status: AC
  Filled 2020-02-17: qty 2

## 2020-02-17 MED ORDER — DOCUSATE SODIUM 100 MG PO CAPS
100.0000 mg | ORAL_CAPSULE | Freq: Every day | ORAL | Status: DC
  Administered 2020-02-17 – 2020-02-18 (×2): 100 mg via ORAL
  Filled 2020-02-17 (×2): qty 1

## 2020-02-17 MED ORDER — APIXABAN 5 MG PO TABS
10.0000 mg | ORAL_TABLET | Freq: Two times a day (BID) | ORAL | Status: DC
  Administered 2020-02-17 – 2020-02-20 (×6): 10 mg via ORAL
  Filled 2020-02-17 (×6): qty 2

## 2020-02-17 MED ORDER — APIXABAN 5 MG PO TABS: 5.0000 mg | ORAL_TABLET | Freq: Two times a day (BID) | ORAL | Status: DC

## 2020-02-17 MED ORDER — DILTIAZEM HCL 25 MG/5ML IV SOLN: 5.0000 mg | Freq: Once | INTRAVENOUS | Status: DC

## 2020-02-17 MED ORDER — MIDAZOLAM HCL 2 MG/2ML IJ SOLN
INTRAMUSCULAR | Status: DC | PRN
  Administered 2020-02-17: 1 mg via INTRAVENOUS

## 2020-02-17 MED ORDER — FENTANYL CITRATE (PF) 100 MCG/2ML IJ SOLN
INTRAMUSCULAR | Status: AC
  Filled 2020-02-17: qty 2

## 2020-02-17 MED ORDER — FENTANYL CITRATE (PF) 100 MCG/2ML IJ SOLN
INTRAMUSCULAR | Status: DC | PRN
  Administered 2020-02-17 (×2): 50 ug via INTRAVENOUS

## 2020-02-17 NOTE — Plan of Care (Signed)
A&O. VSS. PT NPO since midnight. No acute changes or complaints.  Problem: Education: Goal: Knowledge of General Education information will improve Description: Including pain rating scale, medication(s)/side effects and non-pharmacologic comfort measures Outcome: Progressing   Problem: Health Behavior/Discharge Planning: Goal: Ability to manage health-related needs will improve Outcome: Progressing   Problem: Clinical Measurements: Goal: Ability to maintain clinical measurements within normal limits will improve Outcome: Progressing Goal: Will remain free from infection Outcome: Progressing Goal: Diagnostic test results will improve Outcome: Progressing Goal: Respiratory complications will improve Outcome: Progressing Goal: Cardiovascular complication will be avoided Outcome: Progressing   Problem: Activity: Goal: Risk for activity intolerance will decrease Outcome: Progressing   Problem: Nutrition: Goal: Adequate nutrition will be maintained Outcome: Progressing   Problem: Coping: Goal: Level of anxiety will decrease Outcome: Progressing   Problem: Elimination: Goal: Will not experience complications related to bowel motility Outcome: Progressing Goal: Will not experience complications related to urinary retention Outcome: Progressing   Problem: Pain Managment: Goal: General experience of comfort will improve Outcome: Progressing   Problem: Safety: Goal: Ability to remain free from injury will improve Outcome: Progressing   Problem: Skin Integrity: Goal: Risk for impaired skin integrity will decrease Outcome: Progressing   Problem: Education: Goal: Knowledge of disease or condition will improve Outcome: Progressing Goal: Understanding of medication regimen will improve Outcome: Progressing   Problem: Cardiac: Goal: Ability to achieve and maintain adequate cardiopulmonary perfusion will improve Outcome: Progressing

## 2020-02-17 NOTE — Progress Notes (Signed)
PROGRESS NOTE    Timothy Maddox  ZOX:[id number] DOB: May 31, 1945 DOA: 02/13/2020 PCP: [name], MD   Brief Narrative: Taken from H&P Timothy Maddox is a 74 y.o. 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  Pt has a h/o prostate CA, urethral stricture. He is s/p of recent cystoscopy and foley placement by Dr. [name] on 02/07/20. On arrival he was hypotensive which improved with IV fluids, UA looks infected, COVID-19 negative, worsening renal function.  CT renal stone study was concerning for multiple mets involving lung, osseous mets and liver.  Moderate bilateral hydronephrosis to the level of UVJ bilaterally and multiple layering bladder stones surrounding the Foley catheter tip. CTA was obtained due to elevated D-dimer and came back positive for PE, without any CT evidence of right heart strain, patient was started on IV heparin. Developed new onset A. fib with RVR-started on Cardizem infusion, overnight converted back to sinus rhythm and Cardizem infusion was discontinued today.  Cardiology was also consulted.  Subjective: Had liver biopsy this morning, tolerated it well.   Still has pain in his urethra when Foley cath is moved, however, pt appeared more comfortable.  Still putting out dark red urine.   Assessment & Plan:   Principal Problem:   Urinary tract infection associated with indwelling urethral catheter (HCC) Active Problems:   Stroke ([place])   Seizures ([place])   Prostate cancer (HCC)   Hypotension   Hypertension   Type II diabetes mellitus with renal manifestations (HCC)   AKI (acute kidney injury) ([place])   Chronic diastolic CHF (congestive heart failure) (HCC)   Atrial fibrillation with RVR (HCC)   PE (pulmonary thromboembolism) (HCC)   Liver mass  Complicated UTI/hydronephrosis/multiple bladder stones.  Recent  cystoscopy.  Blood cultures negative so far.  Urinary cultures pos for Staphylococcus epidermidis, questionable significance.   PLAN: --given instrumentation, cont ceftriaxone as empiric tx --continue Foley   # Penile pain and likely Urethral injury # Hematuria After pulling on Foley catheter. -Informed urology. -Foley if needs to be replaced should be done by urology.  Prostate cancer with mets Patient was waiting for PET scan for concern of recurrence of bladder cancer.  CT renal stone study and CTA are concerning for multiple mets including lungs, liver and bones. --Oncology consulted, Dr. [name] following PLAN: --liver biopsy today -continue Nubeqa.  New onset A. fib with RVR.   Most likely secondary to PE.  Cardiology was consulted and he was started on Cardizem infusion.  --initiated amiodarone oral loading dose per cardiology.  --HR went up to 110-140's Afib evening 10/13, so started amiodarone gtt, per cardiology rec.  Amiodarone gtt was stopped early this morning because night-team felt pt's HR was already down to 60's.   PLAN: --cont amiodarone oral 400 mg BID --start Eliquis tonight -Continue with metoprolol  Acute Pulmonary embolism.   CTA was done due to elevated D-dimer above 4000.  It was positive for PE.  Patient is high risk secondary to extensive malignancy. Started on heparin infusion. PLAN: --switch to Eliquis tonight  Anemia, iron def Hemoglobin dropped to 10.8 from 12.3.  Most likely secondary to hematuria.  Anemia panel consistent with mild iron deficiency along with anemia of chronic disease.   PLAN: --cont iron suppl  History of CVA.   No acute concern. -Continue Lipitor  History of seizure disorder. -Continue home  dose of Keppra. -Continue with seizure precautions -As needed Ativan  Hypertension.  Blood pressure within goal today. -Continue metoprolol. -Holding home dose of spironolactone and Hyzaar. -Continue IV hydralazine as needed  AKI  with CKD stage IIIa.   Baseline creatinine around 1.2-1.6.  Stable today around 1.73.  Patient appears dehydrated and hypotensive on admission, s/p MIVF -Continue to hold Cozaar and spironolactone. --Hold MIVF and encourage oral hydration  Chronic diastolic heart failure.  Prior echo done in 2017 with normal EF and grade 1 diastolic dysfunction.  Patient appears euvolemic. PLAN: -Holding home dose of spironolactone. -Continue metoprolol  Type II diabetes mellitus with renal manifestations [facility]): Recent A1c 6.3, well controlled.  Patient taking Metformin at home. --BG has been within goal for inpatient, and pt hasn't required more than 1 or 2 units of insulin per day --no need for fingersticks   Objective: Vitals:   02/17/20 1345 02/17/20 1400 02/17/20 1433 02/17/20 1654  BP: 126/69 120/75 128/66 126/71  Pulse: 67 68 68 66  Resp: (!) 24 (!) 22 16 19   Temp: 98.2 F (36.8 C)  97.9 F (36.6 C) 98.3 F (36.8 C)  TempSrc: Oral     SpO2: 92% 93% 99% 95%  Weight:      Height:        Intake/Output Summary (Last 24 hours) at 02/17/2020 1803 Last data filed at 02/17/2020 3532 Gross per 24 hour  Intake 420 ml  Output 1126 ml  Net -706 ml   Filed Weights   02/15/20 0424 02/16/20 0459 02/17/20 0346  Weight: 119.5 kg 118.8 kg 120 kg    Examination: Constitutional: NAD, AAOx3 HEENT: conjunctivae and lids normal, EOMI CV: RRR no M,R,G. Distal pulses +2.  No cyanosis.   RESP: CTA B/L, normal respiratory effort  GI: +BS, large abdomen, NT, dressing over liver biopsy site, clean and dry Extremities: No effusions, edema in BLE SKIN: warm, dry and intact Neuro: II - XII grossly intact.  Sensation intact Psych: better mood and affect.    Foley cath present, outputting dark bloody urine.    DVT prophylaxis: eliquis Code Status: Full Family Communication: updated wife at the bedside today Disposition Plan:  Status is: Inpatient  The patient is from: home Anticipated d/c is to:  home with [facility] Anticipated d/c date is: 1-2 days Patient currently is not medically stable to d/c due to: just had liver biopsy, monitor for bleeding since was on anticoagulation   Consultants:   Urology  Cardiology  Procedures:  Antimicrobials:  Ceftriaxone  Data Reviewed: I have personally reviewed following labs and imaging studies  CBC: Recent Labs  Lab 02/13/20 0817 02/14/20 0457 02/15/20 0709 02/16/20 0332 02/17/20 0350  WBC 9.7 10.4 11.3* 9.8 10.3  HGB 12.3* 10.8* 10.6* 10.3* 10.0*  HCT 37.9* 32.2* 32.2* 31.0* 29.2*  MCV 92.4 89.0 91.2 88.6 88.5  PLT 226 275 261 269 992   Basic Metabolic Panel: Recent Labs  Lab 02/13/20 2043 02/14/20 0457 02/15/20 0709 02/16/20 0332 02/17/20 0350  NA 139 136 137 137 137  K 3.9 4.1 4.1 3.9 4.0  CL 101 104 105 105 106  CO2 21* 19* 19* 20* 20*  GLUCOSE 141* 167* 125* 128* 140*  BUN 49* 48* 53* 42* 40*  CREATININE 1.70* 1.73* 2.50* 1.72* 1.93*  CALCIUM 9.7 9.6 9.4 8.9 9.1  MG  --   --   --  2.4 2.2   GFR: Estimated Creatinine Clearance: 46.2 mL/min (A) (by C-G formula based on SCr of 1.93  mg/dL (H)). Liver Function Tests: No results for input(s): AST, ALT, ALKPHOS, BILITOT, PROT, ALBUMIN in the last 168 hours. No results for input(s): LIPASE, AMYLASE in the last 168 hours. No results for input(s): AMMONIA in the last 168 hours. Coagulation Profile: Recent Labs  Lab 02/13/20 1022 02/17/20 0350  INR 1.2 1.2   Cardiac Enzymes: No results for input(s): CKTOTAL, CKMB, CKMBINDEX, TROPONINI in the last 168 hours. BNP (last 3 results) No results for input(s): PROBNP in the last 8760 hours. HbA1C: No results for input(s): HGBA1C in the last 72 hours. CBG: Recent Labs  Lab 02/14/20 2038 02/15/20 0803 02/15/20 1139 02/15/20 1648 02/15/20 2053  GLUCAP 137* 119* 145* 130* 163*   Lipid Profile: No results for input(s): CHOL, HDL, LDLCALC, TRIG, CHOLHDL, LDLDIRECT in the last 72 hours. Thyroid Function Tests: No  results for input(s): TSH, T4TOTAL, FREET4, T3FREE, THYROIDAB in the last 72 hours. Anemia Panel: No results for input(s): VITAMINB12, FOLATE, FERRITIN, TIBC, IRON, RETICCTPCT in the last 72 hours. Sepsis Labs: Recent Labs  Lab 02/13/20 1022 02/13/20 1437  PROCALCITON 0.42  --   LATICACIDVEN 3.4* 2.0*    Recent Results (from the past 240 hour(s))  Urine culture     Status: Abnormal   Collection Time: 02/13/20  8:46 AM   Specimen: Urine, Catheterized  Result Value Ref Range Status   Specimen Description   Final    URINE, CATHETERIZED Performed at [facility], [address] [place], [place] [address]    Special Requests   Final    NONE Performed at [facility], [place], [place] [address]    Culture 50,000 COLONIES/mL STAPHYLOCOCCUS EPIDERMIDIS (A)  Final   Report Status 02/15/2020 FINAL  Final   Organism ID, Bacteria STAPHYLOCOCCUS EPIDERMIDIS (A)  Final      Susceptibility   Staphylococcus epidermidis - MIC*    CIPROFLOXACIN >=8 RESISTANT Resistant     GENTAMICIN <=0.5 SENSITIVE Sensitive     NITROFURANTOIN <=16 SENSITIVE Sensitive     OXACILLIN <=0.25 SENSITIVE Sensitive     TETRACYCLINE 2 SENSITIVE Sensitive     VANCOMYCIN 1 SENSITIVE Sensitive     TRIMETH/SULFA >=320 RESISTANT Resistant     CLINDAMYCIN >=8 RESISTANT Resistant     RIFAMPIN <=0.5 SENSITIVE Sensitive     Inducible Clindamycin NEGATIVE Sensitive     * 50,000 COLONIES/mL STAPHYLOCOCCUS EPIDERMIDIS  Resp Panel by RT PCR (RSV, Flu A&B, Covid) - Nasopharyngeal Swab     Status: None   Collection Time: 02/13/20  9:59 AM   Specimen: Nasopharyngeal Swab  Result Value Ref Range Status   SARS Coronavirus 2 by RT PCR NEGATIVE NEGATIVE Final    Comment: (NOTE) SARS-CoV-2 target nucleic acids are NOT DETECTED.  The SARS-CoV-2 RNA is generally detectable in upper respiratoy specimens during the acute phase of infection. The lowest concentration of SARS-CoV-2 viral copies this  assay can detect is 131 copies/mL. A negative result does not preclude SARS-Cov-2 infection and should not be used as the sole basis for treatment or other patient management decisions. A negative result may occur with  improper specimen collection/handling, submission of specimen other than nasopharyngeal swab, presence of viral mutation(s) within the areas targeted by this assay, and inadequate number of viral copies (<131 copies/mL). A negative result must be combined with clinical observations, patient history, and epidemiological information. The expected result is Negative.  Fact Sheet for Patients:  PinkCheek.be  Fact Sheet for Healthcare Providers:  GravelBags.it  This test is no  t yet approved or cleared by the [place] and  has been authorized for detection and/or diagnosis of SARS-CoV-2 by FDA under an Emergency Use Authorization (EUA). This EUA will remain  in effect (meaning this test can be used) for the duration of the COVID-19 declaration under Section 564(b)(1) of the Act, 21 U.S.C. section 360bbb-3(b)(1), unless the authorization is terminated or revoked sooner.     Influenza A by PCR NEGATIVE NEGATIVE Final   Influenza B by PCR NEGATIVE NEGATIVE Final    Comment: (NOTE) The Xpert Xpress SARS-CoV-2/FLU/RSV assay is intended as an aid in  the diagnosis of influenza from Nasopharyngeal swab specimens and  should not be used as a sole basis for treatment. Nasal washings and  aspirates are unacceptable for Xpert Xpress SARS-CoV-2/FLU/RSV  testing.  Fact Sheet for Patients: PinkCheek.be  Fact Sheet for Healthcare Providers: GravelBags.it  This test is not yet approved or cleared by the [place] FDA and  has been authorized for detection and/or diagnosis of SARS-CoV-2 by  FDA under an Emergency Use Authorization (EUA). This EUA will  remain  in effect (meaning this test can be used) for the duration of the  Covid-19 declaration under Section 564(b)(1) of the Act, 21  U.S.C. section 360bbb-3(b)(1), unless the authorization is  terminated or revoked.    Respiratory Syncytial Virus by PCR NEGATIVE NEGATIVE Final    Comment: (NOTE) Fact Sheet for Patients: PinkCheek.be  Fact Sheet for Healthcare Providers: GravelBags.it  This test is not yet approved or cleared by the [place] FDA and  has been authorized for detection and/or diagnosis of SARS-CoV-2 by  FDA under an Emergency Use Authorization (EUA). This EUA will remain  in effect (meaning this test can be used) for the duration of the  COVID-19 declaration under Section 564(b)(1) of the Act, 21 U.S.C.  section 360bbb-3(b)(1), unless the authorization is terminated or  revoked. Performed at [facility], [address] [place], [place] [address]   Culture, blood (Routine X 2) w Reflex to ID Panel     Status: None (Preliminary result)   Collection Time: 02/13/20 10:22 AM   Specimen: BLOOD  Result Value Ref Range Status   Specimen Description BLOOD LEFT HAND  Final   Special Requests   Final    BOTTLES DRAWN AEROBIC AND ANAEROBIC Blood Culture results may not be optimal due to an inadequate volume of blood received in culture bottles   Culture   Final    NO GROWTH 4 DAYS Performed at [facility], [address] [place], [place] [address]    Report Status PENDING  Incomplete  Culture, blood (Routine X 2) w Reflex to ID Panel     Status: None (Preliminary result)   Collection Time: 02/13/20 10:30 AM   Specimen: BLOOD  Result Value Ref Range Status   Specimen Description BLOOD LEFT ARM  Final   Special Requests   Final    BOTTLES DRAWN AEROBIC AND ANAEROBIC Blood Culture adequate volume   Culture   Final    NO GROWTH 4 DAYS Performed at [facility], [address] [place], [place] [address]    Report Status PENDING  Incomplete     Radiology Studies: US BIOPSY (LIVER)  Result Date: 02/17/2020 INDICATION: 74 year old with history of prostate cancer and evidence for metastatic disease in the chest and liver. Tissue diagnosis is needed. EXAM: ULTRASOUND-GUIDED LIVER LESION BIOPSY MEDICATIONS: None. ANESTHESIA/SEDATION: Moderate (conscious) sedation was employed during this procedure. A  total of Versed 1.0 mg and Fentanyl 100 mcg was administered intravenously. Moderate Sedation Time: 16 minutes. The patient's level of consciousness and vital signs were monitored continuously by radiology nursing throughout the procedure under my direct supervision. FLUOROSCOPY TIME:  None COMPLICATIONS: None immediate. PROCEDURE: Informed written consent was obtained from the patient after a thorough discussion of the procedural risks, benefits and alternatives. All questions were addressed. A timeout was performed prior to the initiation of the procedure. The liver was evaluated with ultrasound. Hypoechoic lesion along the inferior right hepatic lobe was identified and targeted for biopsy. The right side of the abdomen was prepped with chlorhexidine and sterile field was created. Skin and soft tissues were anesthetized using 1% lidocaine. Small incision was made. Using ultrasound guidance, 17 gauge coaxial needle was directed into the right hepatic lobe and into the hypoechoic lesion. Total of 3 core biopsies were obtained with 18 gauge core device. Gel-Foam slurry was injected through the 17 gauge coaxial needle as it was removed. Bandage placed over the puncture site. FINDINGS: Scattered hypoechoic lesions in the liver. Lesion in the inferior right hepatic lobe was sampled. Biopsy needle was confirmed within the lesion. Adequate specimens were obtained and placed in formalin. No immediate bleeding or hematoma formation. IMPRESSION: Ultrasound-guided core biopsy of right hepatic  lesion. Electronically Signed   By: Markus Daft M.D.   On: 02/17/2020 16:16   US Abdomen Limited RUQ  Result Date: 02/17/2020 CLINICAL DATA:  74 year old with history of prostate cancer. Suspicious liver lesions on noncontrast CT. EXAM: ULTRASOUND ABDOMEN LIMITED RIGHT UPPER QUADRANT COMPARISON:  CT renal stone protocol 02/13/2020 FINDINGS: Gallbladder: Echogenic stone at the base of the gallbladder measuring up to 1.0 cm. Common bile duct: Diameter: 0.5 cm Liver: Liver parenchyma is mildly heterogeneous. There are scattered hypoechoic liver lesions that correspond with the previous CT findings. Round hypoechoic lesion in the right hepatic lobe measures up to 2.5 cm. Round hypoechoic lesion along the inferior margin of the right hepatic lobe measures up to 2.3 cm. Large hypoechoic lesion along the posterior aspect of the liver measures up to 4.2 cm. Portal vein is patent on color Doppler imaging with normal direction of blood flow towards the liver. Other: Partially visualized right kidney demonstrates dilatation of the right renal collecting system and similar to the recent CT. IMPRESSION: 1. Hypoechoic liver lesions. Findings are suggestive for metastatic disease. Patient is scheduled for ultrasound-guided core biopsy of a hepatic lesion. 2. Cholelithiasis. No evidence for gallbladder inflammation or biliary dilatation. Electronically Signed   By: Markus Daft M.D.   On: 02/17/2020 09:33    Scheduled Meds: . amiodarone  400 mg Oral BID  . apixaban  10 mg Oral BID   Followed by  . [START ON 02/24/2020] apixaban  5 mg Oral BID  . [START ON 02/18/2020] cephALEXin  500 mg Oral Q6H  . Chlorhexidine Gluconate Cloth  6 each Topical Daily  . docusate sodium  100 mg Oral Daily  . fentaNYL      . ferrous sulfate  325 mg Oral BID WC  . levETIRAcetam  500 mg Oral BID  . metoprolol tartrate  50 mg Oral BID  . rosuvastatin  5 mg Oral QHS   Continuous Infusions:    LOS: 4 days    Enzo Bi, MD [facility]  If 7PM-7AM, please contact night-coverage [email or web address]  02/17/2020, 6:03 PM

## 2020-02-17 NOTE — Progress Notes (Signed)
   02/17/20 2239  Assess: MEWS Score  Temp 98.3 F (36.8 C)  BP 137/80  Pulse Rate (!) 118  Resp 20  SpO2 95 %  Assess: MEWS Score  MEWS Temp 0  MEWS Systolic 0  MEWS Pulse 2  MEWS RR 0  MEWS LOC 0  MEWS Score 2  MEWS Score Color Yellow  Assess: if the MEWS score is Yellow or Red  Were vital signs taken at a resting state? Yes  Focused Assessment No change from prior assessment  Early Detection of Sepsis Score *See Row Information* Low  MEWS guidelines implemented *See Row Information* Yes  Treat  MEWS Interventions Escalated (See documentation below)  Pain Scale 0-10  Pain Score 0  Breathing 0  Facial Expression 0  Take Vital Signs  Increase Vital Sign Frequency  Yellow: Q 2hr X 2 then Q 4hr X 2, if remains yellow, continue Q 4hrs  Escalate  MEWS: Escalate Yellow: discuss with charge nurse/RN and consider discussing with provider and RRT  Notify: Charge Nurse/RN  Name of Charge Nurse/RN Notified Alisa, RN  Date Charge Nurse/RN Notified 02/17/20  Time Charge Nurse/RN Notified 2239  Notify: Provider  Provider Name/Title [facility]  Date Provider Notified 02/17/20  Time Provider Notified 2239  Notification Type Page (secure chat)  Notification Reason Other (Comment) (afib rvr)  Response See new orders  Date of Provider Response 02/17/20  Time of Provider Response 2240  Document  Patient Outcome  (Pt went back into NSR so cardizem did not need to be given. )  Progress note created (see row info)  (Stat mag and potassium ordered)

## 2020-02-17 NOTE — Procedures (Signed)
Interventional Radiology Procedure:   Indications: History of prostate cancer and evidence for metastatic disease  Procedure: US guided liver lesion biopsy  Findings: 3 cores from right hepatic lesion.  Gelfoam slurry injected through coaxial needle after biopsy.  Complications: None     EBL: less than 10 ml  Plan: Bedrest 3 hours.  May restart heparin in 2 hours without bolus.     Keilynn Marano R. Anselm Pancoast, MD  Pager: [phone]

## 2020-02-17 NOTE — Progress Notes (Signed)
Hematology/Oncology Progress Note [facility] Telephone:([phone] [phone]  Patient Care Team: [name], MD as PCP - General (Internal Medicine) Abbie Sons, MD (Urology)   Name of the patient: Timothy Maddox  [id number]  [birth year]  Date of visit: 02/17/20   INTERVAL HISTORY-  Patient had right upper quadrant ultrasound done today and has a plan for ultrasound-guided liver mass biopsy this afternoon.  He tried Tylenol 3 which helped his pain around the Foley catheter.  Wife is at bedside.    Review of systems- Review of Systems  Constitutional: Positive for fatigue. Negative for appetite change, chills and fever.  HENT:   Negative for hearing loss and voice change.   Eyes: Negative for eye problems and icterus.  Respiratory: Negative for chest tightness, cough and shortness of breath.   Cardiovascular: Negative for chest pain and leg swelling.  Gastrointestinal: Negative for abdominal distention and abdominal pain.  Endocrine: Negative for hot flashes.  Genitourinary: Positive for hematuria. Negative for difficulty urinating, dysuria and frequency.        Pain around Foley catheter  Musculoskeletal: Negative for arthralgias.  Skin: Negative for itching and rash.  Neurological: Negative for light-headedness and numbness.  Hematological: Negative for adenopathy. Does not bruise/bleed easily.  Psychiatric/Behavioral: Negative for confusion.    Allergies  Allergen Reactions  . Poison Oak Extract Rash  . Poison Ivy Extract Rash    Patient Active Problem List   Diagnosis Date Noted  . PE (pulmonary thromboembolism) ([place])   . Liver mass   . Hematuria 02/13/2020  . Hypotension 02/13/2020  . Urinary tract infection associated with indwelling urethral catheter ([place]) 02/13/2020  . Atrial fibrillation with RVR ([place]) 02/13/2020  . Prediabetes   . Hypertension   . Type II diabetes mellitus with renal manifestations ([place])   .  AKI (acute kidney injury) ([place])   . Chronic diastolic CHF (congestive heart failure) ([place])   . Stage 3a chronic kidney disease ([place]) 01/30/2020  . Androgen deprivation therapy 01/30/2020  . Encounter for antineoplastic chemotherapy 09/24/2018  . Weak urine stream 09/24/2018  . Rising PSA level 07/08/2018  . Prostate cancer ([place]) 07/08/2018  . Goals of care, counseling/discussion 12/31/2017  . Osteoarthritis of right knee 07/20/2017  . Seizure ([place]) 12/19/2015  . 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 Seizures ([place]) 12/19/2015  . AVF (arteriovenous fistula) ([place]) 11/16/2015  . ICH (intracerebral hemorrhage) ([place]) 11/16/2015  . Stroke [facility]) 11/13/2015     Past Medical History:  Diagnosis Date  . Anxiety   . Arthritis    lower back, shoulders  . AVM (arteriovenous malformation) brain   . Cancer [facility])    prostate- treated with radiation  . CVA (cerebral infarction) 11/2015  . Depression   . Diabetes mellitus without complication ([place])   . Dyspnea    with exertion  . HOH (hard of hearing)   . Hyperlipidemia   . Hypertension   . Pneumonia    1st grade  . Pre-diabetes   . Prediabetes   . Seizures ([place]) 12/2015  . Sleep apnea   . Stroke [facility])    speech impairment, no weakness     Past Surgical History:  Procedure Laterality Date  . BACK SURGERY  1999   disectomy  . BACK SURGERY     2 surgery- injury  . COLONOSCOPY W/ POLYPECTOMY    . COLONOSCOPY WITH PROPOFOL N/A 02/28/2019   Procedure: COLONOSCOPY WITH PROPOFOL;  Surgeon: Alice Reichert, Benay Pike, MD;  Location:  [facility] ENDOSCOPY;  Service: Gastroenterology;  Laterality: N/A;  . CYSTOSCOPY WITH URETHRAL DILATATION Bilateral 02/07/2020   Procedure: CYSTOSCOPY WITH URETHRAL DILATATION;  Surgeon: Abbie Sons, MD;  Location: [facility] ORS;  Service: Urology;  Laterality: Bilateral;  . HAMMER TOE SURGERY Bilateral   . INGUINAL HERNIA REPAIR Right    per patient when he was in 1st grade  . IR GENERIC HISTORICAL   12/05/2015   IR RADIOLOGIST EVAL & MGMT 12/05/2015 [facility]  . IR GENERIC HISTORICAL  01/31/2016   IR RADIOLOGIST EVAL & MGMT 01/31/2016 [facility]  . IR GENERIC HISTORICAL  02/20/2016   IR ANGIO INTRA EXTRACRAN SEL COM CAROTID INNOMINATE UNI L MOD SED 02/20/2016 [name], MD [facility]  . IR GENERIC HISTORICAL  02/20/2016   IR 3D INDEPENDENT WKST 02/20/2016 [name], MD [facility]  . KNEE SURGERY Bilateral 2015  . RADIOLOGY WITH ANESTHESIA N/A 02/20/2016   Procedure: EMBOLIZATION;  Surgeon: [name], MD;  Location: [place];  Service: Radiology;  Laterality: N/A;  . TOE SURGERY Left 2017   TOE NAIL REMOVAL  . TOTAL KNEE ARTHROPLASTY Right 07/20/2017   Procedure: TOTAL KNEE ARTHROPLASTY;  Surgeon: Lovell Sheehan, MD;  Location: [facility] ORS;  Service: Orthopedics;  Laterality: Right;    Social History   Socioeconomic History  . Marital status: Married    Spouse name: Not on file  . Number of children: Not on file  . Years of education: Not on file  . Highest education level: Not on file  Occupational History  . Occupation: retired Curator  Tobacco Use  . Smoking status: Former Smoker    Packs/day: 1.00    Years: 2.00    Pack years: 2.00    Types: Cigarettes    Quit date: 05/05/1966    Years since quitting: 53.8  . Smokeless tobacco: Never Used  . Tobacco comment: quit age 41  Vaping Use  . Vaping Use: Never used  Substance and Sexual Activity  . Alcohol use: Not Currently  . Drug use: No  . Sexual activity: Not Currently  Other Topics Concern  . Not on file  Social History Narrative  . Not on file   Social Determinants of Health   Financial Resource Strain:   . Difficulty of Paying Living Expenses: Not on file  Food Insecurity:   . Worried About Charity fundraiser in the Last Year: Not on file  . Ran Out of Food in the Last Year: Not on file  Transportation Needs:   . Lack of Transportation (Medical): Not on file  . Lack of  Transportation (Non-Medical): Not on file  Physical Activity:   . Days of Exercise per Week: Not on file  . Minutes of Exercise per Session: Not on file  Stress:   . Feeling of Stress : Not on file  Social Connections:   . Frequency of Communication with Friends and Family: Not on file  . Frequency of Social Gatherings with Friends and Family: Not on file  . Attends Religious Services: Not on file  . Active Member of Clubs or Organizations: Not on file  . Attends Archivist Meetings: Not on file  . Marital Status: Not on file  Intimate Partner Violence:   . Fear of Current or Ex-Partner: Not on file  . Emotionally Abused: Not on file  . Physically Abused: Not on file  . Sexually Abused: Not on file     Family History  Problem Relation Age of  Onset  . Heart failure Father   . Diabetes Father   . Hypertension Father   . Alzheimer's disease Father 30  . Other Mother 93       homicide  . Alcohol abuse Mother   . Pancreatic cancer Sister   . Alcohol abuse Sister   . Diabetes Brother   . Diabetes Paternal Aunt   . Diabetes Paternal Grandmother   . Diabetes Paternal Grandfather      Current Facility-Administered Medications:  .  acetaminophen (TYLENOL) tablet 650 mg, 650 mg, Oral, Q6H PRN, Ivor Costa, MD, 650 mg at 02/16/20 1613 .  acetaminophen-codeine (TYLENOL #3) 300-30 MG per tablet 1 tablet, 1 tablet, Oral, Q4H PRN, Earlie Server, MD .  amiodarone (PACERONE) tablet 400 mg, 400 mg, Oral, BID, Adaline Sill, NP, 400 mg at 02/17/20 1436 .  apixaban (ELIQUIS) tablet 10 mg, 10 mg, Oral, BID **FOLLOWED BY** [START ON 02/24/2020] apixaban (ELIQUIS) tablet 5 mg, 5 mg, Oral, BID, Enzo Bi, MD .  Derrill Memo ON 02/18/2020] cephALEXin (KEFLEX) capsule 500 mg, 500 mg, Oral, Q6H, Enzo Bi, MD .  Chlorhexidine Gluconate Cloth 2 % PADS 6 each, 6 each, Topical, Daily, Ivor Costa, MD, 6 each at 02/16/20 1016 .  darolutamide (NUBEQA) tablet 600 mg, 600 mg, Oral, BID WC, Ivor Costa,  MD .  fentaNYL (SUBLIMAZE) 100 MCG/2ML injection, , , ,  .  fentaNYL (SUBLIMAZE) injection, , Intravenous, PRN, Markus Daft, MD, 50 mcg at 02/17/20 1323 .  hydrALAZINE (APRESOLINE) injection 5 mg, 5 mg, Intravenous, Q2H PRN, Ivor Costa, MD .  levETIRAcetam (KEPPRA) tablet 500 mg, 500 mg, Oral, BID, Ivor Costa, MD, 500 mg at 02/17/20 1437 .  LORazepam (ATIVAN) injection 0.5 mg, 0.5 mg, Intravenous, Q12H PRN, Ivor Costa, MD, 0.5 mg at 02/13/20 1431 .  LORazepam (ATIVAN) injection 0.5-1 mg, 0.5-1 mg, Intravenous, Q2H PRN, Ivor Costa, MD .  metoprolol tartrate (LOPRESSOR) tablet 50 mg, 50 mg, Oral, BID, Ivor Costa, MD, 50 mg at 02/17/20 1437 .  midazolam (VERSED) injection, , Intravenous, PRN, Markus Daft, MD, 1 mg at 02/17/20 1308 .  rosuvastatin (CRESTOR) tablet 5 mg, 5 mg, Oral, QHS, Ivor Costa, MD, 5 mg at 02/16/20 2235 .  sodium chloride flush (NS) 0.9 % injection 3 mL, 3 mL, Intravenous, PRN, Lorella Nimrod, MD, 3 mL at 02/14/20 2314   Physical exam:  Vitals:   02/17/20 1326 02/17/20 1345 02/17/20 1400 02/17/20 1433  BP: 129/67 126/69 120/75 128/66  Pulse: 68 67 68 68  Resp: 16 (!) 24 (!) 22 16  Temp:  98.2 F (36.8 C)  97.9 F (36.6 C)  TempSrc:  Oral    SpO2: 96% 92% 93% 99%  Weight:      Height:       Physical Exam Constitutional:      General: He is not in acute distress.    Appearance: He is obese. He is not diaphoretic.  HENT:     Head: Normocephalic and atraumatic.     Nose: Nose normal.     Mouth/Throat:     Pharynx: No oropharyngeal exudate.  Eyes:     General: No scleral icterus.    Pupils: Pupils are equal, round, and reactive to light.  Cardiovascular:     Rate and Rhythm: Normal rate and regular rhythm.     Heart sounds: No murmur heard.   Pulmonary:     Effort: Pulmonary effort is normal. No respiratory distress.     Breath sounds: No rales.  Chest:  Chest wall: No tenderness.  Abdominal:     General: There is no distension.     Palpations: Abdomen is  soft.     Tenderness: There is no abdominal tenderness.  Genitourinary:    Comments: + Foley catheter, pink color urine Musculoskeletal:        General: Normal range of motion.     Cervical back: Normal range of motion and neck supple.  Skin:    General: Skin is warm and dry.     Findings: No erythema.  Neurological:     Mental Status: He is alert and oriented to person, place, and time.     Cranial Nerves: No cranial nerve deficit.     Motor: No abnormal muscle tone.     Coordination: Coordination normal.  Psychiatric:        Mood and Affect: Affect normal.        CMP Latest Ref Rng & Units 02/17/2020  Glucose 70 - 99 mg/dL 140(H)  BUN 8 - 23 mg/dL 40(H)  Creatinine 0.61 - 1.24 mg/dL 1.93(H)  Sodium 135 - 145 mmol/L 137  Potassium 3.5 - 5.1 mmol/L 4.0  Chloride 98 - 111 mmol/L 106  CO2 22 - 32 mmol/L 20(L)  Calcium 8.9 - 10.3 mg/dL 9.1  Total Protein 6.5 - 8.1 g/dL -  Total Bilirubin 0.3 - 1.2 mg/dL -  Alkaline Phos 38 - 126 U/L -  AST 15 - 41 U/L -  ALT 0 - 44 U/L -   CBC Latest Ref Rng & Units 02/17/2020  WBC 4.0 - 10.5 K/uL 10.3  Hemoglobin 13.0 - 17.0 g/dL 10.0(L)  Hematocrit 39 - 52 % 29.2(L)  Platelets 150 - 400 K/uL 292    RADIOGRAPHIC STUDIES: I have personally reviewed the radiological images as listed and agreed with the findings in the report. CT ANGIO CHEST PE W OR WO CONTRAST  Result Date: 02/13/2020 CLINICAL DATA:  Atrial fibrillation, elevated D-dimer, prostate cancer EXAM: CT ANGIOGRAPHY CHEST WITH CONTRAST TECHNIQUE: Multidetector CT imaging of the chest was performed using the standard protocol during bolus administration of intravenous contrast. Multiplanar CT image reconstructions and MIPs were obtained to evaluate the vascular anatomy. CONTRAST:  69mL OMNIPAQUE IOHEXOL 350 MG/ML SOLN COMPARISON:  None. FINDINGS: Cardiovascular: There is adequate opacification of the pulmonary arterial tree. There are multiple branching central intraluminal  filling defects identified within the right upper and right lower lobar pulmonary arteries as well as multiple segmental pulmonary arteries of the left lower lobe in keeping with acute pulmonary embolism. The central pulmonary arteries are enlarged in keeping with changes of pulmonary arterial hypertension. However, there is no CT evidence of right heart strain with the right to left ventricular ratio within normal limits. Moderate coronary artery calcification. Cardiac size is mildly enlarged. Moderate calcification of the mitral valve annulus. No pericardial effusion. The thoracic aorta is of normal caliber. Mild atherosclerotic calcification is seen within the aortic arch and descending thoracic aorta. Mediastinum/Nodes: There is pathologic bilateral hilar adenopathy. Index lymph node within the right hilum measures 16 mm x 25 mm at axial image # 42/4. Thyroid unremarkable. Esophagus unremarkable. Lungs/Pleura: Multiple bilateral pulmonary masses are identified most in keeping with pulmonary metastatic disease in this patient with a known history of prostate cancer. Index lesion within the right lower lobe measures 3.5 x 5.2 cm at axial image # 62/6. At least 50 nodules are seen scattered throughout the lungs bilaterally. No pneumothorax or pleural effusion. The central airways are widely patent. Upper  Abdomen: Unremarkable Musculoskeletal: Multiple sclerotic metastases are identified within the visualized axial skeleton involving the scapula bilaterally, the sternum, the thoracic spine, and multiple ribs bilaterally. No pathologic fracture. Review of the MIP images confirms the above findings. IMPRESSION: Acute pulmonary embolism. Moderate embolic burden. No CT evidence of right heart strain. Moderate coronary artery calcification.  Mild global cardiomegaly. Innumerable bilateral pulmonary masses and nodules as well as bilateral pathologic hilar adenopathy and widespread sclerotic osseous metastases in keeping  with visceral and osseous metastatic disease related to the patient's underlying prostate cancer. Aortic Atherosclerosis (ICD10-I70.0). Electronically Signed   By: Fidela Salisbury MD   On: 02/13/2020 23:27   US Venous Img Lower Bilateral (DVT)  Result Date: 02/14/2020 CLINICAL DATA:  Pulmonary embolus EXAM: LEFT LOWER EXTREMITY VENOUS DOPPLER ULTRASOUND TECHNIQUE: Gray-scale sonography with graded compression, as well as color Doppler and duplex ultrasound were performed to evaluate the lower extremity deep venous systems from the level of the common femoral vein and including the common femoral, femoral, profunda femoral, popliteal and calf veins including the posterior tibial, peroneal and gastrocnemius veins when visible. The superficial great saphenous vein was also interrogated. Spectral Doppler was utilized to evaluate flow at rest and with distal augmentation maneuvers in the common femoral, femoral and popliteal veins. COMPARISON:  None. FINDINGS: Contralateral Common Femoral Vein: Respiratory phasicity is normal and symmetric with the symptomatic side. No evidence of thrombus. Normal compressibility. Common Femoral Vein: No evidence of thrombus. Normal compressibility, respiratory phasicity and response to augmentation. Saphenofemoral Junction: No evidence of thrombus. Normal compressibility and flow on color Doppler imaging. Profunda Femoral Vein: No evidence of thrombus. Normal compressibility and flow on color Doppler imaging. Femoral Vein: No evidence of thrombus. Normal compressibility, respiratory phasicity and response to augmentation. Popliteal Vein: No evidence of thrombus. Normal compressibility, respiratory phasicity and response to augmentation. Calf Veins: There is nonocclusive thrombus within the left peroneal veins. Superficial Great Saphenous Vein: No evidence of thrombus. Normal compressibility. Venous Reflux:  None. Other Findings:  None. IMPRESSION: Nonocclusive thrombus within the  left peroneal veins. Electronically Signed   By: Ulyses Jarred M.D.   On: 02/14/2020 01:36   DG OR UROLOGY CYSTO IMAGE ([facility] ONLY)  Result Date: 02/07/2020 There is no interpretation for this exam.  This order is for images obtained during a surgical procedure.  Please See "Surgeries" Tab for more information regarding the procedure.   ECHOCARDIOGRAM COMPLETE  Result Date: 02/14/2020    ECHOCARDIOGRAM REPORT   Patient Name:   RONELLE SMALLMAN Date of Exam: 02/14/2020 Medical Rec #:  [id number]       Height:       73.0 in Accession #:    [id number]      Weight:       270.0 lb Date of Birth:  [birth year]        BSA:          2.444 m Patient Age:    63 years        BP:           132/71 mmHg Patient Gender: M               HR:           70 bpm. Exam Location:  [facility] Procedure: 2D Echo, Color Doppler, Cardiac Doppler and Intracardiac            Opacification Agent Indications:     I48.91 Atrial Fibrillation  History:  Patient has prior history of Echocardiogram examinations.                  Stroke; Risk Factors:Sleep Apnea, Hypertension, Dyslipidemia                  and Diabetes.  Sonographer:     Charmayne Sheer RDCS (AE) Referring Phys:  [id number] [place] Diagnosing Phys: Neoma Laming MD  Sonographer Comments: Technically difficult study due to poor echo windows. Image acquisition challenging due to patient body habitus. IMPRESSIONS  1. Left ventricular ejection fraction, by estimation, is 60 to 65%. The left ventricle has normal function. The left ventricle has no regional wall motion abnormalities. There is mild left ventricular hypertrophy. Left ventricular diastolic parameters are consistent with Grade I diastolic dysfunction (impaired relaxation).  2. Right ventricular systolic function is normal. The right ventricular size is normal.  3. Left atrial size was mildly dilated.  4. Right atrial size was mildly dilated.  5. The mitral valve is normal in structure. No evidence of mitral valve  regurgitation. No evidence of mitral stenosis.  6. The aortic valve is normal in structure. Aortic valve regurgitation is not visualized. Mild aortic valve sclerosis is present, with no evidence of aortic valve stenosis.  7. The inferior vena cava is normal in size with greater than 50% respiratory variability, suggesting right atrial pressure of 3 mmHg. FINDINGS  Left Ventricle: Left ventricular ejection fraction, by estimation, is 60 to 65%. The left ventricle has normal function. The left ventricle has no regional wall motion abnormalities. Definity contrast agent was given IV to delineate the left ventricular  endocardial borders. The left ventricular internal cavity size was normal in size. There is mild left ventricular hypertrophy. Left ventricular diastolic parameters are consistent with Grade I diastolic dysfunction (impaired relaxation). Right Ventricle: The right ventricular size is normal. No increase in right ventricular wall thickness. Right ventricular systolic function is normal. Left Atrium: Left atrial size was mildly dilated. Right Atrium: Right atrial size was mildly dilated. Pericardium: There is no evidence of pericardial effusion. Mitral Valve: The mitral valve is normal in structure. No evidence of mitral valve regurgitation. No evidence of mitral valve stenosis. MV peak gradient, 6.0 mmHg. The mean mitral valve gradient is 2.0 mmHg. Tricuspid Valve: The tricuspid valve is normal in structure. Tricuspid valve regurgitation is not demonstrated. No evidence of tricuspid stenosis. Aortic Valve: The aortic valve is normal in structure. Aortic valve regurgitation is not visualized. Mild aortic valve sclerosis is present, with no evidence of aortic valve stenosis. Aortic valve mean gradient measures 3.0 mmHg. Aortic valve peak gradient measures 7.3 mmHg. Pulmonic Valve: The pulmonic valve was normal in structure. Pulmonic valve regurgitation is not visualized. No evidence of pulmonic stenosis.  Aorta: The aortic root is normal in size and structure. Venous: The inferior vena cava is normal in size with greater than 50% respiratory variability, suggesting right atrial pressure of 3 mmHg. IAS/Shunts: No atrial level shunt detected by color flow Doppler.   LV Volumes (MOD) LV vol d, MOD A4C: 91.4 ml Diastology LV vol s, MOD A4C: 26.8 ml LV e' medial:    8.81 cm/s LV SV MOD A4C:     91.4 ml LV E/e' medial:  7.1                            LV e' lateral:   12.40 cm/s  LV E/e' lateral: 5.0  AORTIC VALVE AV Vmax:           135.00 cm/s AV Vmean:          84.100 cm/s AV VTI:            0.227 m AV Peak Grad:      7.3 mmHg AV Mean Grad:      3.0 mmHg LVOT Vmax:         84.20 cm/s LVOT Vmean:        56.100 cm/s LVOT VTI:          0.108 m LVOT/AV VTI ratio: 0.48 MITRAL VALVE MV Area (PHT): 4.49 cm    SHUNTS MV Peak grad:  6.0 mmHg    Systemic VTI: 0.11 m MV Mean grad:  2.0 mmHg MV Vmax:       1.22 m/s MV Vmean:      64.5 cm/s MV Decel Time: 169 msec MV E velocity: 62.60 cm/s MV A velocity: 85.30 cm/s MV E/A ratio:  0.73 Neoma Laming MD Electronically signed by Neoma Laming MD Signature Date/Time: 02/14/2020/4:30:36 PM    Final    CT Renal Stone Study  Result Date: 02/13/2020 CLINICAL DATA:  Prostate cancer. Pink colored urine. Hypotension. Flank pain. EXAM: CT ABDOMEN AND PELVIS WITHOUT CONTRAST TECHNIQUE: Multidetector CT imaging of the abdomen and pelvis was performed following the standard protocol without IV contrast. COMPARISON:  12/23/2017 CT abdomen/pelvis. FINDINGS: Lower chest: Numerous (greater than 20) solid pulmonary nodules and masses at both lung bases, largest 5.2 cm in the basilar right lower lobe (series 4/image 27) and 5.5 cm in the basilar left lower lobe (series 4/image 27), all new. Atherosclerosis. Hepatobiliary: Multiple (at least 5) hypodense liver masses scattered throughout the liver, largest 3.8 cm in the segment 6 right liver lobe (series 2/image 36) and 2.0 cm  in the superior right liver (series 2/image 22), all new. Cholelithiasis. No gallbladder wall thickening or pericholecystic fluid. No biliary ductal dilatation. Pancreas: Normal, with no mass or duct dilation. Spleen: Normal size. No mass. Adrenals/Urinary Tract: Normal right adrenal. Left adrenal 1.5 cm nodule with density 17 HU, stable, most compatible with an adenoma. Moderate bilateral hydroureteronephrosis to the level of the ureterovesical junctions bilaterally. No renal stones. No ureteral stones. Simple 3.7 cm posterior lower left renal cyst. No additional contour deforming renal lesions. Multiple layering stones in distended bladder, largest 19 mm. Chronic mild diffuse bladder wall thickening is unchanged. Foley catheter in place with balloon in the dependent bladder. Stomach/Bowel: Normal non-distended stomach. Normal caliber small bowel with no small bowel wall thickening. Normal appendix. Normal large bowel with no diverticulosis, large bowel wall thickening or pericolonic fat stranding. Vascular/Lymphatic: Atherosclerotic nonaneurysmal abdominal aorta. No pathologically enlarged lymph nodes in the abdomen or pelvis. Reproductive: Newly mildly enlarged, irregular and poorly marginated prostate, directly contiguous with the bladder trigone. Fiducial markers noted in the prostate as before. Other: No pneumoperitoneum, ascites or focal fluid collection. Solid subcutaneous 2.9 cm lateral right chest wall mass (series 2/image 29), new. Similar solid subcutaneous 4.4 cm left gluteal mass (series 2/image 63). Small fat containing umbilical hernia is stable. Musculoskeletal: Numerous new sclerotic osseous lesions throughout the visualized skeleton including multiple lower ribs bilaterally, multiple thoracolumbar vertebrae largest at T11 and throughout the bilateral pelvic girdle, most prominent in the left iliac crest (series 2/image 60). Marked lumbar spondylosis. IMPRESSION: 1. Numerous new solid pulmonary  nodules and masses at both lung bases, largest 5.2 cm in the basilar right  lower lobe, compatible with pulmonary metastases. 2. New widespread sclerotic osseous metastases throughout the visualized skeleton as detailed. 3. Multiple new hypodense liver masses compatible with liver metastases. 4. Newly mildly enlarged, irregular and poorly marginated prostate, directly contiguous with the bladder trigone, suggestive of recurrent locally advanced prostate cancer. 5. Moderate bilateral hydroureteronephrosis to the level of the UVJ bilaterally, probably due to bladder outlet obstruction despite the well-positioned Foley catheter in the bladder. 6. Multiple layering bladder stones in the distended bladder. 7. Cholelithiasis. 8. Stable left adrenal adenoma. 9. Aortic Atherosclerosis (ICD10-I70.0). Electronically Signed   By: Ilona Sorrel M.D.   On: 02/13/2020 10:06   US Abdomen Limited RUQ  Result Date: 02/17/2020 CLINICAL DATA:  74 year old with history of prostate cancer. Suspicious liver lesions on noncontrast CT. EXAM: ULTRASOUND ABDOMEN LIMITED RIGHT UPPER QUADRANT COMPARISON:  CT renal stone protocol 02/13/2020 FINDINGS: Gallbladder: Echogenic stone at the base of the gallbladder measuring up to 1.0 cm. Common bile duct: Diameter: 0.5 cm Liver: Liver parenchyma is mildly heterogeneous. There are scattered hypoechoic liver lesions that correspond with the previous CT findings. Round hypoechoic lesion in the right hepatic lobe measures up to 2.5 cm. Round hypoechoic lesion along the inferior margin of the right hepatic lobe measures up to 2.3 cm. Large hypoechoic lesion along the posterior aspect of the liver measures up to 4.2 cm. Portal vein is patent on color Doppler imaging with normal direction of blood flow towards the liver. Other: Partially visualized right kidney demonstrates dilatation of the right renal collecting system and similar to the recent CT. IMPRESSION: 1. Hypoechoic liver lesions. Findings are  suggestive for metastatic disease. Patient is scheduled for ultrasound-guided core biopsy of a hepatic lesion. 2. Cholelithiasis. No evidence for gallbladder inflammation or biliary dilatation. Electronically Signed   By: Markus Daft M.D.   On: 02/17/2020 09:33    Assessment and plan-   #Castration resistant metastatic prostate cancer He previously had castration resistant biochemical recurrence with no measurable radiographic disease and PSA has responded very well to darolutamide 600 mg twice daily until most recently images showed pulmonary nodules and bone lesions suspecting for disease progression. CT images were independently reviewed by me and discussed with patient, this is consistent with rapid progression to visceral metastasis and osseous metastasis.  This is stage IV disease, patient and his family members have been made aware that this is not a curable condition.  He may benefit from palliative chemotherapy/antineoplasm treatments. Patient will get ultrasound-guided liver biopsy today to confirm tissue diagnosis. Stop darolutamide   #New onset of atrial fibrillation, new acute pulmonary embolism due to cancer, heparin has been on hold for liver biopsy.  Resume heparin later today.  He can be switched to Eliquis upon discharge.   #Anemia, hemoglobin 10.8, likely secondary to chronic hematuria.  Monitor hemoglobin closely.  Transfuse PRBC to keep hemoglobin above 7.  Start oral iron supplementation.  Colace daily last bowel regimen #History of seizure, continue home dose of Keppra.  Seizure precautions.  Avoid tramadol #Ureter injury after pulling Foley catheter.   For pain control, continue Tylenol as instructed.  Patient has an appointment with me next week already to discuss pathology report and future management plan.  Scheduler has called patient's wife and notified her about the appointment. Thank you for allowing me to participate in the care of this patient.   Earlie Server, MD,  PhD Hematology Oncology [facility] Pager- [phone] 02/17/2020

## 2020-02-17 NOTE — Progress Notes (Signed)
SUBJECTIVE: Patient currently not on the floor.   Vitals:   02/16/20 2235 02/17/20 0346 02/17/20 0745 02/17/20 1130  BP: 130/76 127/74 129/88 134/79  Pulse: 78 66 66 72  Resp:  17 16 17   Temp:  98.4 F (36.9 C) 98.6 F (37 C) 98.9 F (37.2 C)  TempSrc:   Oral Oral  SpO2:  95% 94% 97%  Weight:  120 kg    Height:        Intake/Output Summary (Last 24 hours) at 02/17/2020 1219 Last data filed at 02/17/2020 0354 Gross per 24 hour  Intake 660 ml  Output 1476 ml  Net -816 ml    LABS: Basic Metabolic Panel: Recent Labs    02/16/20 0332 02/17/20 0350  NA 137 137  K 3.9 4.0  CL 105 106  CO2 20* 20*  GLUCOSE 128* 140*  BUN 42* 40*  CREATININE 1.72* 1.93*  CALCIUM 8.9 9.1  MG 2.4 2.2   Liver Function Tests: No results for input(s): AST, ALT, ALKPHOS, BILITOT, PROT, ALBUMIN in the last 72 hours. No results for input(s): LIPASE, AMYLASE in the last 72 hours. CBC: Recent Labs    02/16/20 0332 02/17/20 0350  WBC 9.8 10.3  HGB 10.3* 10.0*  HCT 31.0* 29.2*  MCV 88.6 88.5  PLT 269 292   Cardiac Enzymes: No results for input(s): CKTOTAL, CKMB, CKMBINDEX, TROPONINI in the last 72 hours. BNP: Invalid input(s): POCBNP D-Dimer: No results for input(s): DDIMER in the last 72 hours. Hemoglobin A1C: No results for input(s): HGBA1C in the last 72 hours. Fasting Lipid Panel: No results for input(s): CHOL, HDL, LDLCALC, TRIG, CHOLHDL, LDLDIRECT in the last 72 hours. Thyroid Function Tests: No results for input(s): TSH, T4TOTAL, T3FREE, THYROIDAB in the last 72 hours.  Invalid input(s): FREET3 Anemia Panel: No results for input(s): VITAMINB12, FOLATE, FERRITIN, TIBC, IRON, RETICCTPCT in the last 72 hours.   PHYSICAL EXAM Patient is unable to be assessed as he is not on the floor  TELEMETRY: Normal sinus rhythm.  68/BPM  ASSESSMENT AND PLAN: Patient presenting to the emergency room with hematuria and lightheadedness found to have a UTI and pulmonary embolus who then  developed atrial fibrillation with rapid ventricular rate.  Patient was intended to remain on an amiodarone infusion until this morning but I was later informed it had been stopped by the nursing staff.  Since patient had been off the amiodarone drip for over 12 hours, patient was transitioned back to oral amiodarone 400 mg twice daily last night with plan to continue today.  Further titration of amiodarone will be completed in the outpatient setting. Please continue current dosing of metoprolol tartrate 50 mg twice daily. Heparin infusion on hold this morning as patient is completing a liver biopsy, would recommend transitioning to oral anticoagulant after biopsy is completed and patient has a low bleed risk.  We will continue to follow.  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

## 2020-02-17 NOTE — Progress Notes (Addendum)
PT Cancellation Note  Patient Details Name: Timothy Maddox MRN: [id number] DOB: 11/22/1945   Cancelled Treatment:    Reason Eval/Treat Not Completed: Patient at procedure or test/unavailable (Evaluation re-attempted. Patient off unit for diagnostic procedure.  Will re-attempt next date as medically appropriate.)   [name] H. Owens Shark, PT, DPT, NCS 02/17/20, 5:56 PM [phone]

## 2020-02-17 NOTE — Consult Note (Addendum)
Chief Complaint: Liver mass. Request is for liver biopsy  Referring Physician(s): Dr. [name]  Supervising Physician: Markus Daft  Patient Status: [facility] - In-pt  History of Present Illness: Timothy Maddox is a 74 y.o. male History of  CKD, seizures, CVA, DM , CHF. Patient had hematuria and rising PSA  found to be metastatic prostate cancer s/p  cystoscopy with biopsy, TURBT and uretheral dilation on 10.5.21. Patient presented to the ED at [facility] on 10.10.21 with near syncopal episode, pain and gross hematuria in his foley catheter. Evaluation in the ED showed new onset of a fib with RVR, elevated D-Dimer and incidental finding of PE without heart strain from CT on 10.11.21. Team is requesting a liver biopsy for further evaluation and treatment planning.    Past Medical History:  Diagnosis Date  . Anxiety   . Arthritis    lower back, shoulders  . AVM (arteriovenous malformation) brain   . Cancer [facility])    prostate- treated with radiation  . CVA (cerebral infarction) 11/2015  . Depression   . Diabetes mellitus without complication ([place])   . Dyspnea    with exertion  . HOH (hard of hearing)   . Hyperlipidemia   . Hypertension   . Pneumonia    1st grade  . Pre-diabetes   . Prediabetes   . Seizures ([place]) 12/2015  . Sleep apnea   . Stroke [facility])    speech impairment, no weakness    Past Surgical History:  Procedure Laterality Date  . BACK SURGERY  1999   disectomy  . BACK SURGERY     2 surgery- injury  . COLONOSCOPY W/ POLYPECTOMY    . COLONOSCOPY WITH PROPOFOL N/A 02/28/2019   Procedure: COLONOSCOPY WITH PROPOFOL;  Surgeon: Toledo, Benay Pike, MD;  Location: [facility] ENDOSCOPY;  Service: Gastroenterology;  Laterality: N/A;  . CYSTOSCOPY WITH URETHRAL DILATATION Bilateral 02/07/2020   Procedure: CYSTOSCOPY WITH URETHRAL DILATATION;  Surgeon: Abbie Sons, MD;  Location: [facility] ORS;  Service: Urology;  Laterality: Bilateral;  . HAMMER TOE SURGERY Bilateral   . INGUINAL HERNIA  REPAIR Right    per patient when he was in 1st grade  . IR GENERIC HISTORICAL  12/05/2015   IR RADIOLOGIST EVAL & MGMT 12/05/2015 [facility]  . IR GENERIC HISTORICAL  01/31/2016   IR RADIOLOGIST EVAL & MGMT 01/31/2016 [facility]  . IR GENERIC HISTORICAL  02/20/2016   IR ANGIO INTRA EXTRACRAN SEL COM CAROTID INNOMINATE UNI L MOD SED 02/20/2016 [name], MD [facility]  . IR GENERIC HISTORICAL  02/20/2016   IR 3D INDEPENDENT WKST 02/20/2016 [name], MD [facility]  . KNEE SURGERY Bilateral 2015  . RADIOLOGY WITH ANESTHESIA N/A 02/20/2016   Procedure: EMBOLIZATION;  Surgeon: [name], MD;  Location: [facility];  Service: Radiology;  Laterality: N/A;  . TOE SURGERY Left 2017   TOE NAIL REMOVAL  . TOTAL KNEE ARTHROPLASTY Right 07/20/2017   Procedure: TOTAL KNEE ARTHROPLASTY;  Surgeon: Lovell Sheehan, MD;  Location: [facility] ORS;  Service: Orthopedics;  Laterality: Right;    Allergies: Poison oak extract and Poison ivy extract  Medications: Prior to Admission medications   Medication Sig Start Date End Date Taking? Authorizing Provider  amLODipine (NORVASC) 10 MG tablet Take 10 mg by mouth daily.   Yes [provider]  levETIRAcetam (ROWEEPRA) 500 MG tablet Take 500 mg by mouth 2 (two) times daily.   Yes [provider]  losartan-hydrochlorothiazide (HYZAAR) 100-25 MG tablet Take 1 tablet by  mouth daily. 05/26/17  Yes [provider]  metFORMIN (GLUCOPHAGE-XR) 500 MG 24 hr tablet Take 500 mg by mouth 2 (two) times daily. 02/06/20  Yes [provider]  metoprolol tartrate (LOPRESSOR) 100 MG tablet Take 100 mg by mouth 2 (two) times daily. 07/27/18  Yes [provider]  NUBEQA 300 MG tablet TAKE 2 TABLETS (600MG ) BY MOUTH TWO TIMES DAILY WITH A MEAL. Patient taking differently: Take 600 mg by mouth in the morning and at bedtime.  11/22/19  Yes Earlie Server, MD  oxyCODONE-acetaminophen (PERCOCET/ROXICET) 5-325 MG tablet Take 1 tablet  by mouth every 8 (eight) hours as needed for severe pain. 02/07/20  Yes Stoioff, Ronda Fairly, MD  rosuvastatin (CRESTOR) 5 MG tablet Take 5 mg by mouth at bedtime.  11/10/17  Yes [provider]  spironolactone (ALDACTONE) 100 MG tablet Take 100 mg by mouth 2 (two) times daily.   Yes [provider]  acetaminophen (TYLENOL) 500 MG tablet Take 500 mg by mouth every 6 (six) hours as needed (pain.).     [provider]  tamsulosin (FLOMAX) 0.4 MG CAPS capsule TAKE ONE CAPSULE BY MOUTH ONCE DAILY Patient not taking: Reported on 02/13/2020 02/08/20   Earlie Server, MD     Family History  Problem Relation Age of Onset  . Heart failure Father   . Diabetes Father   . Hypertension Father   . Alzheimer's disease Father 76  . Other Mother 2       homicide  . Alcohol abuse Mother   . Pancreatic cancer Sister   . Alcohol abuse Sister   . Diabetes Brother   . Diabetes Paternal Aunt   . Diabetes Paternal Grandmother   . Diabetes Paternal Grandfather     Social History   Socioeconomic History  . Marital status: Married    Spouse name: Not on file  . Number of children: Not on file  . Years of education: Not on file  . Highest education level: Not on file  Occupational History  . Occupation: retired Curator  Tobacco Use  . Smoking status: Former Smoker    Packs/day: 1.00    Years: 2.00    Pack years: 2.00    Types: Cigarettes    Quit date: 05/05/1966    Years since quitting: 53.8  . Smokeless tobacco: Never Used  . Tobacco comment: quit age 41  Vaping Use  . Vaping Use: Never used  Substance and Sexual Activity  . Alcohol use: Not Currently  . Drug use: No  . Sexual activity: Not Currently  Other Topics Concern  . Not on file  Social History Narrative  . Not on file   Social Determinants of Health   Financial Resource Strain:   . Difficulty of Paying Living Expenses: Not on file  Food Insecurity:   . Worried About Charity fundraiser in the Last Year: Not on  file  . Ran Out of Food in the Last Year: Not on file  Transportation Needs:   . Lack of Transportation (Medical): Not on file  . Lack of Transportation (Non-Medical): Not on file  Physical Activity:   . Days of Exercise per Week: Not on file  . Minutes of Exercise per Session: Not on file  Stress:   . Feeling of Stress : Not on file  Social Connections:   . Frequency of Communication with Friends and Family: Not on file  . Frequency of Social Gatherings with Friends and Family: Not on file  .  Attends Religious Services: Not on file  . Active Member of Clubs or Organizations: Not on file  . Attends Archivist Meetings: Not on file  . Marital Status: Not on file    Review of Systems: A 12 point ROS discussed and pertinent positives are indicated in the HPI above.  All other systems are negative.  Review of Systems  Constitutional: Negative for fever.  HENT: Negative for congestion.   Respiratory: Negative for cough and shortness of breath.   Cardiovascular: Negative for chest pain.  Gastrointestinal: Negative for abdominal pain.  Neurological: Negative for headaches.  Psychiatric/Behavioral: Negative for behavioral problems and confusion.    Vital Signs: BP 129/88 (BP Location: Left Arm)   Pulse 66   Temp 98.6 F (37 C) (Oral)   Resp 16   Ht 6\' 1"  (1.854 m)   Wt 264 lb 9.6 oz (120 kg)   SpO2 94%   BMI 34.91 kg/m   Physical Exam Vitals and nursing note reviewed.  Constitutional:      Appearance: He is well-developed.  HENT:     Head: Normocephalic.  Cardiovascular:     Rate and Rhythm: Normal rate and regular rhythm.     Heart sounds: Normal heart sounds.  Pulmonary:     Effort: Pulmonary effort is normal.     Breath sounds: Normal breath sounds.  Musculoskeletal:        General: Normal range of motion.     Cervical back: Normal range of motion.  Skin:    General: Skin is dry.  Neurological:     Mental Status: He is alert and oriented to person,  place, and time.     Imaging: CT ANGIO CHEST PE W OR WO CONTRAST  Result Date: 02/13/2020 CLINICAL DATA:  Atrial fibrillation, elevated D-dimer, prostate cancer EXAM: CT ANGIOGRAPHY CHEST WITH CONTRAST TECHNIQUE: Multidetector CT imaging of the chest was performed using the standard protocol during bolus administration of intravenous contrast. Multiplanar CT image reconstructions and MIPs were obtained to evaluate the vascular anatomy. CONTRAST:  47mL OMNIPAQUE IOHEXOL 350 MG/ML SOLN COMPARISON:  None. FINDINGS: Cardiovascular: There is adequate opacification of the pulmonary arterial tree. There are multiple branching central intraluminal filling defects identified within the right upper and right lower lobar pulmonary arteries as well as multiple segmental pulmonary arteries of the left lower lobe in keeping with acute pulmonary embolism. The central pulmonary arteries are enlarged in keeping with changes of pulmonary arterial hypertension. However, there is no CT evidence of right heart strain with the right to left ventricular ratio within normal limits. Moderate coronary artery calcification. Cardiac size is mildly enlarged. Moderate calcification of the mitral valve annulus. No pericardial effusion. The thoracic aorta is of normal caliber. Mild atherosclerotic calcification is seen within the aortic arch and descending thoracic aorta. Mediastinum/Nodes: There is pathologic bilateral hilar adenopathy. Index lymph node within the right hilum measures 16 mm x 25 mm at axial image # 42/4. Thyroid unremarkable. Esophagus unremarkable. Lungs/Pleura: Multiple bilateral pulmonary masses are identified most in keeping with pulmonary metastatic disease in this patient with a known history of prostate cancer. Index lesion within the right lower lobe measures 3.5 x 5.2 cm at axial image # 62/6. At least 50 nodules are seen scattered throughout the lungs bilaterally. No pneumothorax or pleural effusion. The central  airways are widely patent. Upper Abdomen: Unremarkable Musculoskeletal: Multiple sclerotic metastases are identified within the visualized axial skeleton involving the scapula bilaterally, the sternum, the thoracic spine, and multiple ribs  bilaterally. No pathologic fracture. Review of the MIP images confirms the above findings. IMPRESSION: Acute pulmonary embolism. Moderate embolic burden. No CT evidence of right heart strain. Moderate coronary artery calcification.  Mild global cardiomegaly. Innumerable bilateral pulmonary masses and nodules as well as bilateral pathologic hilar adenopathy and widespread sclerotic osseous metastases in keeping with visceral and osseous metastatic disease related to the patient's underlying prostate cancer. Aortic Atherosclerosis (ICD10-I70.0). Electronically Signed   By: Fidela Salisbury MD   On: 02/13/2020 23:27   US Venous Img Lower Bilateral (DVT)  Result Date: 02/14/2020 CLINICAL DATA:  Pulmonary embolus EXAM: LEFT LOWER EXTREMITY VENOUS DOPPLER ULTRASOUND TECHNIQUE: Gray-scale sonography with graded compression, as well as color Doppler and duplex ultrasound were performed to evaluate the lower extremity deep venous systems from the level of the common femoral vein and including the common femoral, femoral, profunda femoral, popliteal and calf veins including the posterior tibial, peroneal and gastrocnemius veins when visible. The superficial great saphenous vein was also interrogated. Spectral Doppler was utilized to evaluate flow at rest and with distal augmentation maneuvers in the common femoral, femoral and popliteal veins. COMPARISON:  None. FINDINGS: Contralateral Common Femoral Vein: Respiratory phasicity is normal and symmetric with the symptomatic side. No evidence of thrombus. Normal compressibility. Common Femoral Vein: No evidence of thrombus. Normal compressibility, respiratory phasicity and response to augmentation. Saphenofemoral Junction: No evidence of  thrombus. Normal compressibility and flow on color Doppler imaging. Profunda Femoral Vein: No evidence of thrombus. Normal compressibility and flow on color Doppler imaging. Femoral Vein: No evidence of thrombus. Normal compressibility, respiratory phasicity and response to augmentation. Popliteal Vein: No evidence of thrombus. Normal compressibility, respiratory phasicity and response to augmentation. Calf Veins: There is nonocclusive thrombus within the left peroneal veins. Superficial Great Saphenous Vein: No evidence of thrombus. Normal compressibility. Venous Reflux:  None. Other Findings:  None. IMPRESSION: Nonocclusive thrombus within the left peroneal veins. Electronically Signed   By: Ulyses Jarred M.D.   On: 02/14/2020 01:36   DG OR UROLOGY CYSTO IMAGE ([facility] ONLY)  Result Date: 02/07/2020 There is no interpretation for this exam.  This order is for images obtained during a surgical procedure.  Please See "Surgeries" Tab for more information regarding the procedure.   ECHOCARDIOGRAM COMPLETE  Result Date: 02/14/2020    ECHOCARDIOGRAM REPORT   Patient Name:   ANITA MCADORY Date of Exam: 02/14/2020 Medical Rec #:  [id number]       Height:       73.0 in Accession #:    [id number]      Weight:       270.0 lb Date of Birth:  06-Feb-1946        BSA:          2.444 m Patient Age:    50 years        BP:           132/71 mmHg Patient Gender: M               HR:           70 bpm. Exam Location:  [facility] Procedure: 2D Echo, Color Doppler, Cardiac Doppler and Intracardiac            Opacification Agent Indications:     I48.91 Atrial Fibrillation  History:         Patient has prior history of Echocardiogram examinations.                  Stroke; Risk  Factors:Sleep Apnea, Hypertension, Dyslipidemia                  and Diabetes.  Sonographer:     Charmayne Sheer RDCS (AE) Referring Phys:  [id number] [place] Diagnosing Phys: Neoma Laming MD  Sonographer Comments: Technically difficult study due to poor echo windows.  Image acquisition challenging due to patient body habitus. IMPRESSIONS  1. Left ventricular ejection fraction, by estimation, is 60 to 65%. The left ventricle has normal function. The left ventricle has no regional wall motion abnormalities. There is mild left ventricular hypertrophy. Left ventricular diastolic parameters are consistent with Grade I diastolic dysfunction (impaired relaxation).  2. Right ventricular systolic function is normal. The right ventricular size is normal.  3. Left atrial size was mildly dilated.  4. Right atrial size was mildly dilated.  5. The mitral valve is normal in structure. No evidence of mitral valve regurgitation. No evidence of mitral stenosis.  6. The aortic valve is normal in structure. Aortic valve regurgitation is not visualized. Mild aortic valve sclerosis is present, with no evidence of aortic valve stenosis.  7. The inferior vena cava is normal in size with greater than 50% respiratory variability, suggesting right atrial pressure of 3 mmHg. FINDINGS  Left Ventricle: Left ventricular ejection fraction, by estimation, is 60 to 65%. The left ventricle has normal function. The left ventricle has no regional wall motion abnormalities. Definity contrast agent was given IV to delineate the left ventricular  endocardial borders. The left ventricular internal cavity size was normal in size. There is mild left ventricular hypertrophy. Left ventricular diastolic parameters are consistent with Grade I diastolic dysfunction (impaired relaxation). Right Ventricle: The right ventricular size is normal. No increase in right ventricular wall thickness. Right ventricular systolic function is normal. Left Atrium: Left atrial size was mildly dilated. Right Atrium: Right atrial size was mildly dilated. Pericardium: There is no evidence of pericardial effusion. Mitral Valve: The mitral valve is normal in structure. No evidence of mitral valve regurgitation. No evidence of mitral valve stenosis.  MV peak gradient, 6.0 mmHg. The mean mitral valve gradient is 2.0 mmHg. Tricuspid Valve: The tricuspid valve is normal in structure. Tricuspid valve regurgitation is not demonstrated. No evidence of tricuspid stenosis. Aortic Valve: The aortic valve is normal in structure. Aortic valve regurgitation is not visualized. Mild aortic valve sclerosis is present, with no evidence of aortic valve stenosis. Aortic valve mean gradient measures 3.0 mmHg. Aortic valve peak gradient measures 7.3 mmHg. Pulmonic Valve: The pulmonic valve was normal in structure. Pulmonic valve regurgitation is not visualized. No evidence of pulmonic stenosis. Aorta: The aortic root is normal in size and structure. Venous: The inferior vena cava is normal in size with greater than 50% respiratory variability, suggesting right atrial pressure of 3 mmHg. IAS/Shunts: No atrial level shunt detected by color flow Doppler.   LV Volumes (MOD) LV vol d, MOD A4C: 91.4 ml Diastology LV vol s, MOD A4C: 26.8 ml LV e' medial:    8.81 cm/s LV SV MOD A4C:     91.4 ml LV E/e' medial:  7.1                            LV e' lateral:   12.40 cm/s                            LV E/e' lateral: 5.0  AORTIC VALVE  AV Vmax:           135.00 cm/s AV Vmean:          84.100 cm/s AV VTI:            0.227 m AV Peak Grad:      7.3 mmHg AV Mean Grad:      3.0 mmHg LVOT Vmax:         84.20 cm/s LVOT Vmean:        56.100 cm/s LVOT VTI:          0.108 m LVOT/AV VTI ratio: 0.48 MITRAL VALVE MV Area (PHT): 4.49 cm    SHUNTS MV Peak grad:  6.0 mmHg    Systemic VTI: 0.11 m MV Mean grad:  2.0 mmHg MV Vmax:       1.22 m/s MV Vmean:      64.5 cm/s MV Decel Time: 169 msec MV E velocity: 62.60 cm/s MV A velocity: 85.30 cm/s MV E/A ratio:  0.73 Neoma Laming MD Electronically signed by Neoma Laming MD Signature Date/Time: 02/14/2020/4:30:36 PM    Final    CT Renal Stone Study  Result Date: 02/13/2020 CLINICAL DATA:  Prostate cancer. Pink colored urine. Hypotension. Flank pain. EXAM: CT  ABDOMEN AND PELVIS WITHOUT CONTRAST TECHNIQUE: Multidetector CT imaging of the abdomen and pelvis was performed following the standard protocol without IV contrast. COMPARISON:  12/23/2017 CT abdomen/pelvis. FINDINGS: Lower chest: Numerous (greater than 20) solid pulmonary nodules and masses at both lung bases, largest 5.2 cm in the basilar right lower lobe (series 4/image 27) and 5.5 cm in the basilar left lower lobe (series 4/image 27), all new. Atherosclerosis. Hepatobiliary: Multiple (at least 5) hypodense liver masses scattered throughout the liver, largest 3.8 cm in the segment 6 right liver lobe (series 2/image 36) and 2.0 cm in the superior right liver (series 2/image 22), all new. Cholelithiasis. No gallbladder wall thickening or pericholecystic fluid. No biliary ductal dilatation. Pancreas: Normal, with no mass or duct dilation. Spleen: Normal size. No mass. Adrenals/Urinary Tract: Normal right adrenal. Left adrenal 1.5 cm nodule with density 17 HU, stable, most compatible with an adenoma. Moderate bilateral hydroureteronephrosis to the level of the ureterovesical junctions bilaterally. No renal stones. No ureteral stones. Simple 3.7 cm posterior lower left renal cyst. No additional contour deforming renal lesions. Multiple layering stones in distended bladder, largest 19 mm. Chronic mild diffuse bladder wall thickening is unchanged. Foley catheter in place with balloon in the dependent bladder. Stomach/Bowel: Normal non-distended stomach. Normal caliber small bowel with no small bowel wall thickening. Normal appendix. Normal large bowel with no diverticulosis, large bowel wall thickening or pericolonic fat stranding. Vascular/Lymphatic: Atherosclerotic nonaneurysmal abdominal aorta. No pathologically enlarged lymph nodes in the abdomen or pelvis. Reproductive: Newly mildly enlarged, irregular and poorly marginated prostate, directly contiguous with the bladder trigone. Fiducial markers noted in the  prostate as before. Other: No pneumoperitoneum, ascites or focal fluid collection. Solid subcutaneous 2.9 cm lateral right chest wall mass (series 2/image 29), new. Similar solid subcutaneous 4.4 cm left gluteal mass (series 2/image 63). Small fat containing umbilical hernia is stable. Musculoskeletal: Numerous new sclerotic osseous lesions throughout the visualized skeleton including multiple lower ribs bilaterally, multiple thoracolumbar vertebrae largest at T11 and throughout the bilateral pelvic girdle, most prominent in the left iliac crest (series 2/image 60). Marked lumbar spondylosis. IMPRESSION: 1. Numerous new solid pulmonary nodules and masses at both lung bases, largest 5.2 cm in the basilar right lower lobe, compatible with pulmonary metastases. 2.  New widespread sclerotic osseous metastases throughout the visualized skeleton as detailed. 3. Multiple new hypodense liver masses compatible with liver metastases. 4. Newly mildly enlarged, irregular and poorly marginated prostate, directly contiguous with the bladder trigone, suggestive of recurrent locally advanced prostate cancer. 5. Moderate bilateral hydroureteronephrosis to the level of the UVJ bilaterally, probably due to bladder outlet obstruction despite the well-positioned Foley catheter in the bladder. 6. Multiple layering bladder stones in the distended bladder. 7. Cholelithiasis. 8. Stable left adrenal adenoma. 9. Aortic Atherosclerosis (ICD10-I70.0). Electronically Signed   By: Ilona Sorrel M.D.   On: 02/13/2020 10:06    Labs:  CBC: Recent Labs    02/14/20 0457 02/15/20 0709 02/16/20 0332 02/17/20 0350  WBC 10.4 11.3* 9.8 10.3  HGB 10.8* 10.6* 10.3* 10.0*  HCT 32.2* 32.2* 31.0* 29.2*  PLT 275 261 269 292    COAGS: Recent Labs    02/13/20 1022 02/17/20 0350  INR 1.2 1.2  APTT 38*  --     BMP: Recent Labs    05/17/19 1249 05/17/19 1249 08/09/19 1245 08/09/19 1245 11/01/19 0925 11/01/19 0925 01/30/20 3016  02/12/20 1758 02/14/20 0457 02/15/20 0709 02/16/20 0332 02/17/20 0350  NA 138   < > 137   < > 136   < > 136   < > 136 137 137 137  K 4.1   < > 4.0   < > 4.1   < > 4.9   < > 4.1 4.1 3.9 4.0  CL 103   < > 102   < > 103   < > 104   < > 104 105 105 106  CO2 24   < > 25   < > 24   < > 25   < > 19* 19* 20* 20*  GLUCOSE 118*   < > 114*   < > 133*   < > 133*   < > 167* 125* 128* 140*  BUN 25*   < > 19   < > 25*   < > 39*   < > 48* 53* 42* 40*  CALCIUM 9.4   < > 9.3   < > 9.2   < > 9.2   < > 9.6 9.4 8.9 9.1  CREATININE 1.23   < > 1.24   < > 1.45*   < > 1.62*   < > 1.73* 2.50* 1.72* 1.93*  GFRNONAA 58*   < > 57*   < > 47*   < > 41*   < > 38* 25* 39* 34*  GFRAA >60  --  >60  --  55*  --  48*  --   --   --   --   --    < > = values in this interval not displayed.    LIVER FUNCTION TESTS: Recent Labs    05/17/19 1249 08/09/19 1245 11/01/19 0925 01/30/20 0937  BILITOT 0.6 0.9 0.7 0.6  AST 25 29 26 18   ALT 25 26 23 14   ALKPHOS 44 41 45 53  PROT 7.5 7.2 7.5 7.7  ALBUMIN 4.1 3.9 4.1 3.8    Assessment and Plan:  46 y.,o. male inpatient. History of  CKD, seizures, CVA, DM , CHF. Patient had hematuria and rising PSA  found to be metastatic prostate cancer s/p  cystoscopy with biopsy, TURBT and uretheral dilation on 10.5.21. Patient presented to the ED at [facility] on 10.10.21 with near syncopal episode, pain and gross hematuria in his foley catheter. Evaluation in the  ED showed new onset of a fib with RVR, elevated D-Dimer and incidental finding of PE without heart strain from CT on 10.11.21. Team is requesting a liver biopsy for further evaluation and treatment planning.   US abdomen  from 10.15.21 shows liver masses. Patient is on heparin gtt for PE (stopped at 6:00 am), BUN 40., Cr 1.93. all other labs and medications are within acceptable parameters. Patient has been NPO since midnight,   Risks and benefits of liver was discussed with the patient and/or patient's family including, but not limited  to bleeding, infection, damage to adjacent structures or low yield requiring additional tests.  All of the questions were answered and there is agreement to proceed.  Consent signed and in chart.   hank you for this interesting consult.  I greatly enjoyed meeting Timothy Maddox and look forward to participating in their care.  A copy of this report was sent to the requesting provider on this date.  Electronically Signed: Jacqualine Mau, NP 02/17/2020, 8:24 AM   I spent a total of 40 Minutes    in face to face in clinical consultation, greater than 50% of which was counseling/coordinating care for liver biopsy

## 2020-02-17 NOTE — Progress Notes (Signed)
Pt off the floor getting biospy done while MEWS turned yellow

## 2020-02-17 NOTE — Progress Notes (Signed)
Pt back  in Afib, RVR and CCMD reports that it started over an hour ago.  Rate is 120's-130's.  See vs's.  On call MD ( Dr. Sidney Ace) notified.  Ekg being done.  Pt denies cp ar palps, but feels "weaker than ususal."

## 2020-02-17 NOTE — Progress Notes (Addendum)
ANTICOAGULATION CONSULT NOTE - Follow up Timothy Maddox for Heparin Indication: pulmonary embolus  Allergies  Allergen Reactions  . Poison Oak Extract Rash  . Poison Ivy Extract Rash   Patient Measurements: Height: 6\' 1"  (185.4 cm) Weight: 120 kg (264 lb 9.6 oz) IBW/kg (Calculated) : 79.9 HEPARIN DW (KG): 106.1  Vital Signs: Temp: 98.4 F (36.9 C) (10/15 0346) Temp Source: Oral (10/14 1937) BP: 127/74 (10/15 0346) Pulse Rate: 66 (10/15 0346)  Labs: Recent Labs    02/14/20 1214 02/14/20 2156 02/15/20 0709 02/15/20 1510 02/16/20 0332 02/16/20 0332 02/16/20 1428 02/16/20 2217 02/17/20 0350  HGB  --    < > 10.6*  --  10.3*  --   --   --  10.0*  HCT  --   --  32.2*  --  31.0*  --   --   --  29.2*  PLT  --   --  261  --  269  --   --   --  292  LABPROT  --   --   --   --   --   --   --   --  14.3  INR  --   --   --   --   --   --   --   --  1.2  HEPARINUNFRC 0.32   < > 0.49   < > 0.20*   < > 0.36 0.40 0.30  CREATININE  --   --  2.50*  --  1.72*  --   --   --  1.93*  TROPONINIHS 14  --   --   --   --   --   --   --   --    < > = values in this interval not displayed.    Estimated Creatinine Clearance: 46.2 mL/min (A) (by C-G formula based on SCr of 1.93 mg/dL (H)).   Assessment: 74 yo male who presented to ED with hematuria and lightheadedness. Found to have UTI and pulmonary embolism and later developed Afib with RVR. Baseline labs noted. No anticoagulants PTA.  Pharmacy has been consulted for heparin dosing and monitoring for PE.  Patient has foley in placed and urine draining noted to be cherry red without clots  Hgb 12>12.3>10.8>10.6  10/12 1214 HL 0.32; therapeutic 10/12 2156 HL 0.23; subtherapeutic - rate increased to 1850 units/hr 10/13 0709 HL 0.49; therapeutic 10/13 1510 HL 0.45; therapeutic x2 10/14 0332 HL 0.20, SUBtherapeutic, CBC stable. Will increase heparin drip to 2000 units/hr and recheck HL in 8 hours.  10/14 1428 HL 0.36,  10/14 2217  HL 0.40, therapeutic x 2 10/15 0350 HL 0.30, therapeutic x 3  Goal of Therapy:  Heparin level 0.3-0.7 units/ml Monitor platelets by anticoagulation protocol: Yes   Plan:  Heparin level is therapeutic. Will continue heparin drip at 2000 units/hr and recheck HL in am. CBC daily while on heparin.    Addendum - heparin drip stopped 10/15 am in advance of liver biopsy - nursing to reach out to pharmacy regarding a follow-up level once drip resumes after biopsy  Cardiology plan to transition to oral anticoagulation (Xarelto or Eliquis) once cleared by urology  Ena Dawley, PharmD 02/17/2020 5:40 AM

## 2020-02-18 ENCOUNTER — Inpatient Hospital Stay: Payer: Medicare HMO

## 2020-02-18 DIAGNOSIS — I2699 Other pulmonary embolism without acute cor pulmonale: Secondary | ICD-10-CM | POA: Diagnosis not present

## 2020-02-18 DIAGNOSIS — I4891 Unspecified atrial fibrillation: Secondary | ICD-10-CM | POA: Diagnosis not present

## 2020-02-18 DIAGNOSIS — C61 Malignant neoplasm of prostate: Secondary | ICD-10-CM | POA: Diagnosis not present

## 2020-02-18 DIAGNOSIS — R160 Hepatomegaly, not elsewhere classified: Secondary | ICD-10-CM | POA: Diagnosis not present

## 2020-02-18 LAB — BASIC METABOLIC PANEL
Anion gap: 11 (ref 5–15)
BUN: 39 mg/dL — ABNORMAL HIGH (ref 8–23)
CO2: 19 mmol/L — ABNORMAL LOW (ref 22–32)
Calcium: 9 mg/dL (ref 8.9–10.3)
Chloride: 103 mmol/L (ref 98–111)
Creatinine, Ser: 1.72 mg/dL — ABNORMAL HIGH (ref 0.61–1.24)
GFR, Estimated: 39 mL/min — ABNORMAL LOW (ref >60)
Glucose, Bld: 158 mg/dL — ABNORMAL HIGH (ref 70–99)
Potassium: 4 mmol/L (ref 3.5–5.1)
Sodium: 133 mmol/L — ABNORMAL LOW (ref 135–145)

## 2020-02-18 LAB — CULTURE, BLOOD (ROUTINE X 2)
Culture: NO GROWTH
Culture: NO GROWTH
Special Requests: ADEQUATE

## 2020-02-18 LAB — CBC
HCT: 28.6 % — ABNORMAL LOW (ref 39.0–52.0)
Hemoglobin: 9.4 g/dL — ABNORMAL LOW (ref 13.0–17.0)
MCH: 29.6 pg (ref 26.0–34.0)
MCHC: 32.9 g/dL (ref 30.0–36.0)
MCV: 89.9 fL (ref 80.0–100.0)
Platelets: 230 10*3/uL (ref 150–400)
RBC: 3.18 MIL/uL — ABNORMAL LOW (ref 4.22–5.81)
RDW: 14.4 % (ref 11.5–15.5)
WBC: 11.4 10*3/uL — ABNORMAL HIGH (ref 4.0–10.5)
nRBC: 0 % (ref 0.0–0.2)

## 2020-02-18 LAB — POTASSIUM: Potassium: 4 mmol/L (ref 3.5–5.1)

## 2020-02-18 LAB — MAGNESIUM
Magnesium: 1.9 mg/dL (ref 1.7–2.4)
Magnesium: 2.5 mg/dL — ABNORMAL HIGH (ref 1.7–2.4)

## 2020-02-18 MED ORDER — SIMETHICONE 80 MG PO CHEW
80.0000 mg | CHEWABLE_TABLET | Freq: Four times a day (QID) | ORAL | Status: DC | PRN
  Administered 2020-02-18: 80 mg via ORAL
  Filled 2020-02-18 (×2): qty 1

## 2020-02-18 MED ORDER — SENNOSIDES-DOCUSATE SODIUM 8.6-50 MG PO TABS: 2.0000 | ORAL_TABLET | Freq: Every day | ORAL | Status: DC

## 2020-02-18 MED ORDER — SENNOSIDES-DOCUSATE SODIUM 8.6-50 MG PO TABS
2.0000 | ORAL_TABLET | Freq: Every day | ORAL | Status: DC
  Administered 2020-02-18 – 2020-02-24 (×2): 2 via ORAL
  Filled 2020-02-18 (×5): qty 2

## 2020-02-18 MED ORDER — FUROSEMIDE 10 MG/ML IJ SOLN
40.0000 mg | Freq: Once | INTRAMUSCULAR | Status: AC
  Administered 2020-02-18: 40 mg via INTRAVENOUS
  Filled 2020-02-18: qty 4

## 2020-02-18 MED ORDER — MAGNESIUM SULFATE 2 GM/50ML IV SOLN
2.0000 g | Freq: Once | INTRAVENOUS | Status: AC
  Administered 2020-02-18: 2 g via INTRAVENOUS

## 2020-02-18 MED ORDER — POLYETHYLENE GLYCOL 3350 17 G PO PACK
34.0000 g | PACK | ORAL | Status: AC
  Administered 2020-02-18: 34 g via ORAL
  Filled 2020-02-18: qty 2

## 2020-02-18 MED ORDER — POLYETHYLENE GLYCOL 3350 17 G PO PACK
34.0000 g | PACK | ORAL | Status: DC
  Administered 2020-02-18: 34 g via ORAL
  Filled 2020-02-18: qty 2

## 2020-02-18 MED ORDER — MAGNESIUM SULFATE 2 GM/50ML IV SOLN
INTRAVENOUS | Status: AC
  Filled 2020-02-18: qty 50

## 2020-02-18 NOTE — Progress Notes (Signed)
PROGRESS NOTE    Timothy Maddox  PYK:[id number] DOB: [birth year] DOA: 02/13/2020 PCP: [name], MD   Brief Narrative: Taken from H&P Timothy Maddox is a 74 y.o. 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  Pt has a h/o prostate CA, urethral stricture. He is s/p of recent cystoscopy and foley placement by Dr. [name] on 02/07/20. On arrival he was hypotensive which improved with IV fluids, UA looks infected, COVID-19 negative, worsening renal function.  CT renal stone study was concerning for multiple mets involving lung, osseous mets and liver.  Moderate bilateral hydronephrosis to the level of UVJ bilaterally and multiple layering bladder stones surrounding the Foley catheter tip. CTA was obtained due to elevated D-dimer and came back positive for PE, without any CT evidence of right heart strain, patient was started on IV heparin. Developed new onset A. fib with RVR-started on Cardizem infusion, overnight converted back to sinus rhythm and Cardizem infusion was discontinued today.  Cardiology was also consulted.  Subjective: Overnight, pt had an episode of Afib w RVR that lasted for about an hour.  Pt did feel unwell during that time.   Today, pt complained of feeling bloated and also more short of breath.  Per PT note, "After ambulating to bathroom, pt became symptomatic with diaphoresis, pallor, and labored breathing with SpO2 at 84% on RA."   Assessment & Plan:   Principal Problem:   Urinary tract infection associated with indwelling urethral catheter ([place]) Active Problems:   Stroke ([place])   Seizures ([place])   Prostate cancer ([place])   Hypotension   Hypertension   Type II diabetes mellitus with renal manifestations ([place])   AKI (acute kidney injury) ([place])   Chronic diastolic CHF (congestive heart failure) ([facility])   Atrial  fibrillation with RVR ([place])   PE (pulmonary thromboembolism) ([place])   Liver mass   Acute hypoxic respiratory failure --While working with PT to ambulate, O2 sat noted to drop to 84% on RA. --CXR today no acute finding except for low lung volumes and known mets. --Trial Lasix 40 mg x1  Complicated UTI/hydronephrosis/multiple bladder stones.  Recent cystoscopy.  Blood cultures negative so far.  Urinary cultures pos for Staphylococcus epidermidis, questionable significance.   PLAN: --given instrumentation, cont ceftriaxone as empiric tx for 7 days total --continue Foley   # Penile pain and likely Urethral injury # Hematuria After pulling on Foley catheter. -Informed urology. -Foley if needs to be replaced should be done by urology.  Prostate cancer with mets Patient was waiting for PET scan for concern of recurrence of bladder cancer.  CT renal stone study and CTA are concerning for multiple mets including lungs, liver and bones. --Oncology consulted, Dr. [name] following --liver biopsy on 10/15 PLAN: -continue Nubeqa.  New onset A. fib with RVR.   Most likely secondary to PE.  Cardiology was consulted and he was started on Cardizem infusion.  --initiated amiodarone oral loading dose per cardiology.  --HR went up to 110-140's Afib evening 10/13, so started amiodarone gtt, per cardiology rec.  Amiodarone gtt was stopped early this morning because night-team felt pt's HR was already down to 60's.   --another episode of Afib RVR night of 10/15 PLAN: --cont amiodarone oral 400 mg BID --cont metop 50 mg BID --continue Eliquis  Acute Pulmonary embolism.   CTA was done due to elevated D-dimer  above 4000.  It was positive for PE.  Patient is high risk secondary to extensive malignancy.  Started on heparin infusion and then transitioned to Eliquis PLAN: --cont Eliquis  Anemia, iron def Hemoglobin dropped to 10.8 from 12.3.  Most likely secondary to hematuria.  Anemia panel consistent with  mild iron deficiency along with anemia of chronic disease.   PLAN: --cont iron suppl  History of CVA.   No acute concern. -Continue Lipitor  History of seizure disorder. -Continue home dose of Keppra. -Continue with seizure precautions -As needed Ativan  Hypertension.   Blood pressure within goal today. -Continue metoprolol. -Holding home dose of spironolactone and Hyzaar. -Continue IV hydralazine as needed  AKI with CKD stage IIIa.   Baseline creatinine around 1.2-1.6.  Stable today around 1.73.  Patient appears dehydrated and hypotensive on admission, s/p MIVF -Continue to hold Cozaar and spironolactone. --Hold MIVF and encourage oral hydration  Chronic diastolic heart failure.  Prior echo done in 2017 with normal EF and grade 1 diastolic dysfunction.  Patient appears euvolemic. PLAN: -Holding home dose of spironolactone. -Continue metoprolol  Type II diabetes mellitus with renal manifestations [facility]): Recent A1c 6.3, well controlled.  Patient taking Metformin at home. --BG has been within goal for inpatient, and pt hasn't required more than 1 or 2 units of insulin per day --no need for fingersticks  Abdominal distention --KUB today showed increased bowel gas, but no stool burden, nor bowel obstruction --Miralax and Senna to stimulate bowel movement --simethicone PRN   Objective: Vitals:   02/18/20 0725 02/18/20 0802 02/18/20 1202 02/18/20 1604  BP: (!) 158/81 (!) 154/77 139/89 (!) 153/81  Pulse: 64 65 75 73  Resp:      Temp: 98.5 F (36.9 C)   98 F (36.7 C)  TempSrc: Oral   Oral  SpO2: 96% 95% 95% 95%  Weight:      Height:        Intake/Output Summary (Last 24 hours) at 02/18/2020 1732 Last data filed at 02/18/2020 1622 Gross per 24 hour  Intake 400.68 ml  Output 1225 ml  Net -824.32 ml   Filed Weights   02/16/20 0459 02/17/20 0346 02/18/20 0357  Weight: 118.8 kg 120 kg 116.4 kg    Examination: Constitutional: NAD, AAOx3 HEENT: conjunctivae and  lids normal, EOMI CV: RRR no M,R,G. Distal pulses +2.  No cyanosis.   RESP: more shallow and faster breaths. GI: +BS, large abdomen, somewhat distended, discomfort to palpation. Extremities: No effusions, edema in BLE SKIN: warm, dry and intact Neuro: II - XII grossly intact.  Sensation intact  Foley cath present, urine less bloody today.   DVT prophylaxis: eliquis Code Status: Full Family Communication: updated wife at the bedside today Disposition Plan:  Status is: Inpatient  The patient is from: home Anticipated d/c is to: home with [facility] Anticipated d/c date is: 1-2 days Patient currently is not medically stable to d/c due to: developed acute hypoxic respiratory failure today, O2 dropped with ambulation.     Consultants:   Urology  Cardiology  Procedures:  Antimicrobials:  Ceftriaxone  Data Reviewed: I have personally reviewed following labs and imaging studies  CBC: Recent Labs  Lab 02/14/20 0457 02/15/20 0709 02/16/20 0332 02/17/20 0350 02/18/20 0435  WBC 10.4 11.3* 9.8 10.3 11.4*  HGB 10.8* 10.6* 10.3* 10.0* 9.4*  HCT 32.2* 32.2* 31.0* 29.2* 28.6*  MCV 89.0 91.2 88.6 88.5 89.9  PLT 275 261 269 292 287   Basic Metabolic Panel: Recent Labs  Lab  02/14/20 0457 02/14/20 0457 02/15/20 0709 02/16/20 0332 02/17/20 0350 02/17/20 2333 02/18/20 0435  NA 136  --  137 137 137  --  133*  K 4.1   < > 4.1 3.9 4.0 4.0 4.0  CL 104  --  105 105 106  --  103  CO2 19*  --  19* 20* 20*  --  19*  GLUCOSE 167*  --  125* 128* 140*  --  158*  BUN 48*  --  53* 42* 40*  --  39*  CREATININE 1.73*  --  2.50* 1.72* 1.93*  --  1.72*  CALCIUM 9.6  --  9.4 8.9 9.1  --  9.0  MG  --   --   --  2.4 2.2 1.9 2.5*   < > = values in this interval not displayed.   GFR: Estimated Creatinine Clearance: 51.1 mL/min (A) (by C-G formula based on SCr of 1.72 mg/dL (H)). Liver Function Tests: No results for input(s): AST, ALT, ALKPHOS, BILITOT, PROT, ALBUMIN in the last 168 hours. No  results for input(s): LIPASE, AMYLASE in the last 168 hours. No results for input(s): AMMONIA in the last 168 hours. Coagulation Profile: Recent Labs  Lab 02/13/20 1022 02/17/20 0350  INR 1.2 1.2   Cardiac Enzymes: No results for input(s): CKTOTAL, CKMB, CKMBINDEX, TROPONINI in the last 168 hours. BNP (last 3 results) No results for input(s): PROBNP in the last 8760 hours. HbA1C: No results for input(s): HGBA1C in the last 72 hours. CBG: Recent Labs  Lab 02/14/20 2038 02/15/20 0803 02/15/20 1139 02/15/20 1648 02/15/20 2053  GLUCAP 137* 119* 145* 130* 163*   Lipid Profile: No results for input(s): CHOL, HDL, LDLCALC, TRIG, CHOLHDL, LDLDIRECT in the last 72 hours. Thyroid Function Tests: No results for input(s): TSH, T4TOTAL, FREET4, T3FREE, THYROIDAB in the last 72 hours. Anemia Panel: No results for input(s): VITAMINB12, FOLATE, FERRITIN, TIBC, IRON, RETICCTPCT in the last 72 hours. Sepsis Labs: Recent Labs  Lab 02/13/20 1022 02/13/20 1437  PROCALCITON 0.42  --   LATICACIDVEN 3.4* 2.0*    Recent Results (from the past 240 hour(s))  Urine culture     Status: Abnormal   Collection Time: 02/13/20  8:46 AM   Specimen: Urine, Catheterized  Result Value Ref Range Status   Specimen Description   Final    URINE, CATHETERIZED Performed at [facility], [address] [place], [place] [address]    Special Requests   Final    NONE Performed at [facility], [place], [place] [address]    Culture 50,000 COLONIES/mL STAPHYLOCOCCUS EPIDERMIDIS (A)  Final   Report Status 02/15/2020 FINAL  Final   Organism ID, Bacteria STAPHYLOCOCCUS EPIDERMIDIS (A)  Final      Susceptibility   Staphylococcus epidermidis - MIC*    CIPROFLOXACIN >=8 RESISTANT Resistant     GENTAMICIN <=0.5 SENSITIVE Sensitive     NITROFURANTOIN <=16 SENSITIVE Sensitive     OXACILLIN <=0.25 SENSITIVE Sensitive     TETRACYCLINE 2 SENSITIVE Sensitive     VANCOMYCIN 1  SENSITIVE Sensitive     TRIMETH/SULFA >=320 RESISTANT Resistant     CLINDAMYCIN >=8 RESISTANT Resistant     RIFAMPIN <=0.5 SENSITIVE Sensitive     Inducible Clindamycin NEGATIVE Sensitive     * 50,000 COLONIES/mL STAPHYLOCOCCUS EPIDERMIDIS  Resp Panel by RT PCR (RSV, Flu A&B, Covid) - Nasopharyngeal Swab     Status: None   Collection Time: 02/13/20  9:59 AM   Specimen: Nasopharyngeal  Swab  Result Value Ref Range Status   SARS Coronavirus 2 by RT PCR NEGATIVE NEGATIVE Final    Comment: (NOTE) SARS-CoV-2 target nucleic acids are NOT DETECTED.  The SARS-CoV-2 RNA is generally detectable in upper respiratoy specimens during the acute phase of infection. The lowest concentration of SARS-CoV-2 viral copies this assay can detect is 131 copies/mL. A negative result does not preclude SARS-Cov-2 infection and should not be used as the sole basis for treatment or other patient management decisions. A negative result may occur with  improper specimen collection/handling, submission of specimen other than nasopharyngeal swab, presence of viral mutation(s) within the areas targeted by this assay, and inadequate number of viral copies (<131 copies/mL). A negative result must be combined with clinical observations, patient history, and epidemiological information. The expected result is Negative.  Fact Sheet for Patients:  PinkCheek.be  Fact Sheet for Healthcare Providers:  GravelBags.it  This test is no t yet approved or cleared by the [place] FDA and  has been authorized for detection and/or diagnosis of SARS-CoV-2 by FDA under an Emergency Use Authorization (EUA). This EUA will remain  in effect (meaning this test can be used) for the duration of the COVID-19 declaration under Section 564(b)(1) of the Act, 21 U.S.C. section 360bbb-3(b)(1), unless the authorization is terminated or revoked sooner.     Influenza A by PCR  NEGATIVE NEGATIVE Final   Influenza B by PCR NEGATIVE NEGATIVE Final    Comment: (NOTE) The Xpert Xpress SARS-CoV-2/FLU/RSV assay is intended as an aid in  the diagnosis of influenza from Nasopharyngeal swab specimens and  should not be used as a sole basis for treatment. Nasal washings and  aspirates are unacceptable for Xpert Xpress SARS-CoV-2/FLU/RSV  testing.  Fact Sheet for Patients: PinkCheek.be  Fact Sheet for Healthcare Providers: GravelBags.it  This test is not yet approved or cleared by the [place] FDA and  has been authorized for detection and/or diagnosis of SARS-CoV-2 by  FDA under an Emergency Use Authorization (EUA). This EUA will remain  in effect (meaning this test can be used) for the duration of the  Covid-19 declaration under Section 564(b)(1) of the Act, 21  U.S.C. section 360bbb-3(b)(1), unless the authorization is  terminated or revoked.    Respiratory Syncytial Virus by PCR NEGATIVE NEGATIVE Final    Comment: (NOTE) Fact Sheet for Patients: PinkCheek.be  Fact Sheet for Healthcare Providers: GravelBags.it  This test is not yet approved or cleared by the [place] FDA and  has been authorized for detection and/or diagnosis of SARS-CoV-2 by  FDA under an Emergency Use Authorization (EUA). This EUA will remain  in effect (meaning this test can be used) for the duration of the  COVID-19 declaration under Section 564(b)(1) of the Act, 21 U.S.C.  section 360bbb-3(b)(1), unless the authorization is terminated or  revoked. Performed at [facility], [address] [place], [place] [address]   Culture, blood (Routine X 2) w Reflex to ID Panel     Status: None   Collection Time: 02/13/20 10:22 AM   Specimen: BLOOD  Result Value Ref Range Status   Specimen Description BLOOD LEFT HAND  Final   Special Requests   Final     BOTTLES DRAWN AEROBIC AND ANAEROBIC Blood Culture results may not be optimal due to an inadequate volume of blood received in culture bottles   Culture   Final    NO GROWTH 5 DAYS Performed at [facility], [address] [place],  [place] [address]    Report Status 02/18/2020 FINAL  Final  Culture, blood (Routine X 2) w Reflex to ID Panel     Status: None   Collection Time: 02/13/20 10:30 AM   Specimen: BLOOD  Result Value Ref Range Status   Specimen Description BLOOD LEFT ARM  Final   Special Requests   Final    BOTTLES DRAWN AEROBIC AND ANAEROBIC Blood Culture adequate volume   Culture   Final    NO GROWTH 5 DAYS Performed at [facility], [address] [place], [place] [address]    Report Status 02/18/2020 FINAL  Final     Radiology Studies: DG Abd 1 View  Result Date: 02/18/2020 CLINICAL DATA:  Dyspnea and abdominal pain. Per physician notes - States he feels dyspnea with exertion as he recently used the restroom with Physical Therapy. Denies chest pain. Patient also denies any symptoms when he went into A fib with RVR last evening. Hx - prostate cancer, DM, HTN, PNA, former smoker. EXAM: ABDOMEN - 1 VIEW COMPARISON:  CT, 02/13/2020 FINDINGS: Increased bowel gas, but no bowel dilation to suggest obstruction. A catheter projects in the lower pelvis consistent with a urinary catheter. There are 3 radiation therapy markers projecting in the region of the prostate gland. A calcification is noted in the right upper quadrant consistent with a gallstone as was noted on the recent CT. There are scattered aortic and iliac atherosclerotic vascular calcifications. Soft tissues are poorly defined but otherwise unremarkable. No acute skeletal abnormality. Lung base nodules are incompletely imaged, better noted on the recent prior CT. IMPRESSION: 1. No acute findings.  No evidence of bowel obstruction. Electronically Signed   By: Lajean Manes M.D.   On: 02/18/2020 17:17   US  BIOPSY (LIVER)  Result Date: 02/17/2020 INDICATION: 74 year old with history of prostate cancer and evidence for metastatic disease in the chest and liver. Tissue diagnosis is needed. EXAM: ULTRASOUND-GUIDED LIVER LESION BIOPSY MEDICATIONS: None. ANESTHESIA/SEDATION: Moderate (conscious) sedation was employed during this procedure. A total of Versed 1.0 mg and Fentanyl 100 mcg was administered intravenously. Moderate Sedation Time: 16 minutes. The patient's level of consciousness and vital signs were monitored continuously by radiology nursing throughout the procedure under my direct supervision. FLUOROSCOPY TIME:  None COMPLICATIONS: None immediate. PROCEDURE: Informed written consent was obtained from the patient after a thorough discussion of the procedural risks, benefits and alternatives. All questions were addressed. A timeout was performed prior to the initiation of the procedure. The liver was evaluated with ultrasound. Hypoechoic lesion along the inferior right hepatic lobe was identified and targeted for biopsy. The right side of the abdomen was prepped with chlorhexidine and sterile field was created. Skin and soft tissues were anesthetized using 1% lidocaine. Small incision was made. Using ultrasound guidance, 17 gauge coaxial needle was directed into the right hepatic lobe and into the hypoechoic lesion. Total of 3 core biopsies were obtained with 18 gauge core device. Gel-Foam slurry was injected through the 17 gauge coaxial needle as it was removed. Bandage placed over the puncture site. FINDINGS: Scattered hypoechoic lesions in the liver. Lesion in the inferior right hepatic lobe was sampled. Biopsy needle was confirmed within the lesion. Adequate specimens were obtained and placed in formalin. No immediate bleeding or hematoma formation. IMPRESSION: Ultrasound-guided core biopsy of right hepatic lesion. Electronically Signed   By: Markus Daft M.D.   On: 02/17/2020 16:16   DG Chest Port 1  View  Result Date: 02/18/2020 CLINICAL DATA:  Dyspnea, abdominal pain EXAM: PORTABLE CHEST 1 VIEW COMPARISON:  Chest radiographs, 11/13/2015, CT chest 02/13/2020 FINDINGS: The heart size and mediastinal contours are within normal limits. Low volume AP portable examination without acute abnormality. Multiple bilateral pulmonary masses and nodules. The visualized skeletal structures are unremarkable. IMPRESSION: 1. No acute abnormality of the lungs in low volume AP portable examination. 2. Multiple bilateral pulmonary masses and nodules as seen on prior CT and in keeping with metastatic disease. Electronically Signed   By: Eddie Candle M.D.   On: 02/18/2020 17:15   US Abdomen Limited RUQ  Result Date: 02/17/2020 CLINICAL DATA:  74 year old with history of prostate cancer. Suspicious liver lesions on noncontrast CT. EXAM: ULTRASOUND ABDOMEN LIMITED RIGHT UPPER QUADRANT COMPARISON:  CT renal stone protocol 02/13/2020 FINDINGS: Gallbladder: Echogenic stone at the base of the gallbladder measuring up to 1.0 cm. Common bile duct: Diameter: 0.5 cm Liver: Liver parenchyma is mildly heterogeneous. There are scattered hypoechoic liver lesions that correspond with the previous CT findings. Round hypoechoic lesion in the right hepatic lobe measures up to 2.5 cm. Round hypoechoic lesion along the inferior margin of the right hepatic lobe measures up to 2.3 cm. Large hypoechoic lesion along the posterior aspect of the liver measures up to 4.2 cm. Portal vein is patent on color Doppler imaging with normal direction of blood flow towards the liver. Other: Partially visualized right kidney demonstrates dilatation of the right renal collecting system and similar to the recent CT. IMPRESSION: 1. Hypoechoic liver lesions. Findings are suggestive for metastatic disease. Patient is scheduled for ultrasound-guided core biopsy of a hepatic lesion. 2. Cholelithiasis. No evidence for gallbladder inflammation or biliary dilatation.  Electronically Signed   By: Markus Daft M.D.   On: 02/17/2020 09:33    Scheduled Meds: . amiodarone  400 mg Oral BID  . apixaban  10 mg Oral BID   Followed by  . [START ON 02/24/2020] apixaban  5 mg Oral BID  . cephALEXin  500 mg Oral Q6H  . Chlorhexidine Gluconate Cloth  6 each Topical Daily  . diltiazem  5 mg Intravenous Once  . docusate sodium  100 mg Oral Daily  . ferrous sulfate  325 mg Oral BID WC  . levETIRAcetam  500 mg Oral BID  . metoprolol tartrate  50 mg Oral BID  . polyethylene glycol  34 g Oral Q2H  . rosuvastatin  5 mg Oral QHS   Continuous Infusions:    LOS: 5 days    Enzo Bi, MD [facility]  If 7PM-7AM, please contact night-coverage [email or web address]  02/18/2020, 5:32 PM

## 2020-02-18 NOTE — Evaluation (Signed)
Physical Therapy Evaluation Patient Details Name: Timothy Maddox MRN: [id number] DOB: 05-02-1946 Today's Date: 02/18/2020   History of Present Illness  Timothy Maddox is a 74 y.o. 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 Imaging concerning for mets to lungs, osseous, and liver. Pt s/p liver biopsy on 10/15 and was dx with new onset of Afib with RVR; started on cardizem drip yesterday.    Clinical Impression  Pt is a 74 year old M admitted to hospital on 02/13/20 for UTI. At baseline, pt was independent with most ADL's with exception of catheter management; otherwise he notes splitting IADL's with spouse and ambulating without AD. Pt notes wife "might" be able to provide physical assistance at home. Pt presents with decreased cardiopulmonary activity tolerance, decreased balance, generalized weakness, and increased O2 dependence from baseline, resulting in impaired functional mobility. Due to deficits, pt required supervision for bed mobility, mod assist for transfers with RW, and CGA for short distance ambulation with RW. After ambulating to bathroom, pt became symptomatic with diaphoresis, pallor, and labored breathing with SpO2 at 84% on RA. Pt with poor insight into deficits but did note increased difficulty after short ambulation, with RPE at 4-6/10 indicating "moderate activity." Pt able to recover >90% on 5L O2 within 108min while seated EOB with cues for pursed lip breathing. Pt was left on 3L O2 via [place] with SpO2 in low 90's. RN and NP notified of pt vitals. Deficits limit the pt's ability to safely and independently perform ADL's, transfer, and ambulate. Pt will benefit from acute skilled PT services to address deficits for return to baseline function. At this time, pt is with poor insight into deficits and requires increased assistance for  transfers/short distance ambulation. Therefore, PT recommends SNF at DC to address deficits and improve overall safety with functional mobility prior to DC. Pt verbalized wanting to go home. If pt declines PT original recommendation, PT recommends DC home with 24/7 assistance/supervision, HHPT, and 3in1 for energy conservation.     Follow Up Recommendations SNF    Equipment Recommendations  3in1 (PT)    Recommendations for Other Services       Precautions / Restrictions Precautions Precautions: Fall Restrictions Weight Bearing Restrictions: No      Mobility  Bed Mobility Overal bed mobility: Needs Assistance Bed Mobility: Supine to Sit;Rolling Rolling: Supervision   Supine to sit: Supervision;HOB elevated     General bed mobility comments: Supervision for safety for supine<>sit transfer with HOB elevated and use of hand rail for support; required increased time/effort. Supervision for safety for bil rolling in supine with cues for sequencing.  Transfers Overall transfer level: Needs assistance Equipment used: Rolling walker (2 wheeled) Transfers: Sit to/from Stand Sit to Stand: Mod assist         General transfer comment: Mod assist to stand from EOB with RW. Min assist to stand from elevated commode height with RW. Verbal cues provided for hand placement. Pt required increased time/effort to perform mobility.  Ambulation/Gait Ambulation/Gait assistance: Min guard Gait Distance (Feet): 20 Feet Assistive device: Rolling walker (2 wheeled)       General Gait Details: Pt required CGA for safety to ambulate 74ft x2 from EOB<>bathroom with RW. Pt demonstrated slowed cadence, early reciprocal gait, and decreased foot clearance bil. Required multimodal cues for walker negotiation and proximity around commode. Pt with x1 LOB  that required mod assist for correction.  Stairs            Wheelchair Mobility    Modified Rankin (Stroke Patients Only)       Balance  Overall balance assessment: Needs assistance Sitting-balance support: Feet supported;No upper extremity supported Sitting balance-Leahy Scale: Good Sitting balance - Comments: Good seated balance at EOB without LOB     Standing balance-Leahy Scale: Poor Standing balance comment: Poor standing balance in RW with x1 LOB requiring mod assist for correction                             Pertinent Vitals/Pain Pain Assessment: Faces Faces Pain Scale: Hurts even more Pain Location: catheter area hurts with mobility Pain Descriptors / Indicators: Sharp Pain Intervention(s): Limited activity within patient's tolerance;Monitored during session    Home Living Family/patient expects to be discharged to:: Private residence Living Arrangements: Spouse/significant other Available Help at Discharge: Friend(s);Available PRN/intermittently Type of Home: Apartment Home Access: Elevator;Ramped entrance     Home Layout: One level Home Equipment: Walker - 2 wheels;Shower seat - built in;Grab bars - tub/shower      Prior Function Level of Independence: Needs assistance   Gait / Transfers Assistance Needed: fxl mobility with no AD in home or community.  ADL's / Homemaking Assistance Needed: requires some assist from significant other to manage catheter bag in/out of pants. But able to do most BADLs himself. Significant other mostly drives, cooks and cleans, and gets groceries.  Comments: hasn't driven in 2-3 weeks d/t weakness     Hand Dominance   Dominant Hand: Right    Extremity/Trunk Assessment   Upper Extremity Assessment Upper Extremity Assessment: Defer to OT evaluation    Lower Extremity Assessment Lower Extremity Assessment: Generalized weakness    Cervical / Trunk Assessment Cervical / Trunk Assessment: Kyphotic  Communication   Communication: No difficulties  Cognition Arousal/Alertness: Awake/alert Behavior During Therapy: WFL for tasks  assessed/performed Overall Cognitive Status: Within Functional Limits for tasks assessed                                 General Comments: Pt A&O x4, HOH so needs low and slow speech.      General Comments General comments (skin integrity, edema, etc.): On RA upon entry with SpO2 at 100%. After ambulating to bathroom pt became symptomatic with diaphoresis, pallor, and labored breathing; SpO2 at 84-86%. Ambulated back to bed and pt administered 5L O2 via [place]; able to recover >90% after 54min with cues for pursed lip breathing    Exercises Other Exercises Other Exercises: Pt provided education regarding: activity modification, O2 needs, safety with mobility in RW, discharge recommendations. Other Exercises: On RA upon entry with SpO2 at 100%. After ambulating to bathroom pt became symptomatic with diaphoresis, pallor, and labored breathing; SpO2 at 84-86%. Ambulated back to bed and pt administered 5L O2 via [place]; able to recover >90% after 60min with cues for pursed lip breathing. RN and NP notified of event. Other Exercises: Pt able to perform bil rolling in bed for self care ADL's, but cues for sequencing. Pt able to perform with supervision.   Assessment/Plan    PT Assessment Patient needs continued PT services  PT Problem List Decreased strength;Decreased mobility;Obesity;Decreased activity tolerance;Cardiopulmonary status limiting activity;Decreased balance       PT Treatment Interventions Gait training;Functional mobility training;Therapeutic activities;Therapeutic exercise;Balance training;Neuromuscular  re-education;Patient/family education    PT Goals (Current goals can be found in the Care Plan section)  Acute Rehab PT Goals Patient Stated Goal: to get stronger and go home PT Goal Formulation: With patient Time For Goal Achievement: 03/03/20 Potential to Achieve Goals: Fair    Frequency Min 2X/week   Barriers to discharge Decreased caregiver support       Co-evaluation               AM-PAC PT "6 Clicks" Mobility  Outcome Measure Help needed turning from your back to your side while in a flat bed without using bedrails?: None Help needed moving from lying on your back to sitting on the side of a flat bed without using bedrails?: A Little Help needed moving to and from a bed to a chair (including a wheelchair)?: A Little Help needed standing up from a chair using your arms (e.g., wheelchair or bedside chair)?: A Lot Help needed to walk in hospital room?: A Little Help needed climbing 3-5 steps with a railing? : A Lot 6 Click Score: 17    End of Session Equipment Utilized During Treatment: Gait belt;Oxygen Activity Tolerance: Patient limited by fatigue;Treatment limited secondary to medical complications (Comment) (Drop in SpO2 (see misc exercise section for details)) Patient left: in bed;with call bell/phone within reach;with bed alarm set (bed in chair position with 3L O2 via [place]) Nurse Communication: Mobility status;Other (comment) (Need for O2) PT Visit Diagnosis: Unsteadiness on feet (R26.81);Difficulty in walking, not elsewhere classified (R26.2);Muscle weakness (generalized) (M62.81)    Time: 2694-8546 PT Time Calculation (min) (ACUTE ONLY): 31 min   Charges:   PT Evaluation $PT Eval Moderate Complexity: 1 Mod PT Treatments $Therapeutic Activity: 8-22 mins        Herminio Commons, PT, DPT 1:34 PM,02/18/20

## 2020-02-18 NOTE — Progress Notes (Signed)
OT Cancellation Note  Patient Details Name: Timothy Maddox MRN: [id number] DOB: [birth year]   Cancelled Treatment:    Reason Eval/Treat Not Completed: Patient declined, no reason specified  Pt resting in bed. Pt politely declines to get up with OT at this time stating that he's already done PT today, is tired, and might go home tomorrow. Will f/u for OT treatment at later date/time as able. Thank you.  Gerrianne Scale, MS, OTR/L ascom [phone] 02/18/20, 1:43 PM

## 2020-02-18 NOTE — TOC Progression Note (Signed)
Transition of Care [facility]) - Progression Note    Patient Details  Name: Timothy Maddox MRN: [id number] Date of Birth: [birth year]  Transition of Care [facility]) CM/SW [place], LCSW Phone Number: 02/18/2020, 11:28 AM  Clinical Narrative:    CSW was notified patient's status had changed as to what physical therapy recommendations, patient is now needs skilled nursing rehab. CSW met with patient and wife, Timothy Maddox at bedside to discuss change in recommendations. Patient and wife agree patient does not want to go to skilled nursing for rehab. Patient and wife are in agreement for having HH/PT services in the home. CSW discussed available agencies, patient and wife agree they have no preferences. CSW completed HH/PT referral with Helene Kelp with [facility]. Helene Kelp accepted patient and will provide needed services upon discharge.        Expected Discharge Plan and Services                                                 Social Determinants of Health (SDOH) Interventions    Readmission Risk Interventions No flowsheet data found.

## 2020-02-18 NOTE — Progress Notes (Signed)
SUBJECTIVE: Patient reasting in bed. States he feels dyspnea with exertion as he recently used the restroom with Physical Therapy. Denies chest pain. Patient also denies any symptoms when he went into A fib with RVR last evening.   Vitals:   02/17/20 2239 02/18/20 0030 02/18/20 0235 02/18/20 0357  BP: 137/80 137/77 138/78 133/85  Pulse: (!) 118 78 72 69  Resp: 20 18 20 19   Temp: 98.3 F (36.8 C) 99 F (37.2 C) 99 F (37.2 C) 98 F (36.7 C)  TempSrc: Oral Oral Oral Oral  SpO2: 95% 94% 94% 95%  Weight:    116.4 kg  Height:        Intake/Output Summary (Last 24 hours) at 02/18/2020 1012 Last data filed at 02/18/2020 0900 Gross per 24 hour  Intake 400.68 ml  Output 1950 ml  Net -1549.32 ml    LABS: Basic Metabolic Panel: Recent Labs    02/17/20 0350 02/17/20 0350 02/17/20 2333 02/18/20 0435  NA 137  --   --  133*  K 4.0   < > 4.0 4.0  CL 106  --   --  103  CO2 20*  --   --  19*  GLUCOSE 140*  --   --  158*  BUN 40*  --   --  39*  CREATININE 1.93*  --   --  1.72*  CALCIUM 9.1  --   --  9.0  MG 2.2   < > 1.9 2.5*   < > = values in this interval not displayed.   Liver Function Tests: No results for input(s): AST, ALT, ALKPHOS, BILITOT, PROT, ALBUMIN in the last 72 hours. No results for input(s): LIPASE, AMYLASE in the last 72 hours. CBC: Recent Labs    02/17/20 0350 02/18/20 0435  WBC 10.3 11.4*  HGB 10.0* 9.4*  HCT 29.2* 28.6*  MCV 88.5 89.9  PLT 292 230   Cardiac Enzymes: No results for input(s): CKTOTAL, CKMB, CKMBINDEX, TROPONINI in the last 72 hours. BNP: Invalid input(s): POCBNP D-Dimer: No results for input(s): DDIMER in the last 72 hours. Hemoglobin A1C: No results for input(s): HGBA1C in the last 72 hours. Fasting Lipid Panel: No results for input(s): CHOL, HDL, LDLCALC, TRIG, CHOLHDL, LDLDIRECT in the last 72 hours. Thyroid Function Tests: No results for input(s): TSH, T4TOTAL, T3FREE, THYROIDAB in the last 72 hours.  Invalid input(s):  FREET3 Anemia Panel: No results for input(s): VITAMINB12, FOLATE, FERRITIN, TIBC, IRON, RETICCTPCT in the last 72 hours.   PHYSICAL EXAM General: Well developed, well nourished, in no acute distress HEENT:  Normocephalic and atramatic Neck:  No JVD.  Lungs: Clear bilaterally to auscultation and percussion. Heart: HRRR . Normal S1 and S2 without gallops or murmurs.  Abdomen: Bowel sounds are positive, abdomen soft and non-tender  Msk:  Back normal, normal gait. Normal strength and tone for age. Extremities: No clubbing, cyanosis or edema.   Neuro: Alert and oriented X 3. Psych:  Good affect, responds appropriately  TELEMETRY: NSR. 80/bpm  ASSESSMENT AND PLAN: Patient presenting to the emergency room with hematuria and lightheadedness found to have a UTI and pulmonary embolus who then developed atrial fibrillation with rapid ventricular rate. Patient went back into A fib with RVR last night for two hours. EKG not completed. Magnesium mildly low and replaced, normal potassium. Plan to continue amiodarone 400mg  bid with further titration as an outpatient. Please continue current dosing of metoprolol tartrate 50 mg twice daily. Patient has transitioned to eliquis, please continue as hematuria  appears to be improving. Will continue to follow.   Principal Problem:   Urinary tract infection associated with indwelling urethral catheter ([place]) Active Problems:   Stroke ([place])   Seizures ([place])   Prostate cancer (H[facility])   Hypotension   Hypertension   Type II diabetes mellitus with renal manifestations (H[facility])   AKI (acute kidney injury) ([place])   Chronic diastolic CHF (congestive heart failure) (HCC)   Atrial fibrillation with RVR ([place])   PE (pulmonary thromboembolism) ([place])   Liver mass    Adaline Sill, NP-C 02/18/2020 10:12 AM

## 2020-02-18 NOTE — Progress Notes (Signed)
Per order from MD Billie Ruddy pt only ordered 2 doses of miralax last admin at 2015. Will notify incoming shift. Will continue to monitor.

## 2020-02-19 ENCOUNTER — Inpatient Hospital Stay: Payer: Medicare HMO

## 2020-02-19 DIAGNOSIS — N179 Acute kidney failure, unspecified: Secondary | ICD-10-CM | POA: Diagnosis not present

## 2020-02-19 DIAGNOSIS — R160 Hepatomegaly, not elsewhere classified: Secondary | ICD-10-CM | POA: Diagnosis not present

## 2020-02-19 DIAGNOSIS — I2699 Other pulmonary embolism without acute cor pulmonale: Secondary | ICD-10-CM | POA: Diagnosis not present

## 2020-02-19 LAB — BASIC METABOLIC PANEL
Anion gap: 15 (ref 5–15)
BUN: 60 mg/dL — ABNORMAL HIGH (ref 8–23)
CO2: 16 mmol/L — ABNORMAL LOW (ref 22–32)
Calcium: 9.1 mg/dL (ref 8.9–10.3)
Chloride: 104 mmol/L (ref 98–111)
Creatinine, Ser: 3.45 mg/dL — ABNORMAL HIGH (ref 0.61–1.24)
GFR, Estimated: 17 mL/min — ABNORMAL LOW (ref >60)
Glucose, Bld: 138 mg/dL — ABNORMAL HIGH (ref 70–99)
Potassium: 5 mmol/L (ref 3.5–5.1)
Sodium: 135 mmol/L (ref 135–145)

## 2020-02-19 LAB — CBC
HCT: 30.4 % — ABNORMAL LOW (ref 39.0–52.0)
Hemoglobin: 10.1 g/dL — ABNORMAL LOW (ref 13.0–17.0)
MCH: 29.5 pg (ref 26.0–34.0)
MCHC: 33.2 g/dL (ref 30.0–36.0)
MCV: 88.9 fL (ref 80.0–100.0)
Platelets: 275 10*3/uL (ref 150–400)
RBC: 3.42 MIL/uL — ABNORMAL LOW (ref 4.22–5.81)
RDW: 14.6 % (ref 11.5–15.5)
WBC: 15 10*3/uL — ABNORMAL HIGH (ref 4.0–10.5)
nRBC: 0 % (ref 0.0–0.2)

## 2020-02-19 LAB — MAGNESIUM: Magnesium: 2.6 mg/dL — ABNORMAL HIGH (ref 1.7–2.4)

## 2020-02-19 LAB — PROCALCITONIN: Procalcitonin: 0.75 ng/mL

## 2020-02-19 MED ORDER — CEPHALEXIN 500 MG PO CAPS
500.0000 mg | ORAL_CAPSULE | Freq: Three times a day (TID) | ORAL | Status: DC
  Administered 2020-02-19 – 2020-02-20 (×2): 500 mg via ORAL
  Filled 2020-02-19 (×2): qty 1

## 2020-02-19 NOTE — Progress Notes (Signed)
PROGRESS NOTE    Timothy Maddox  SRP:[id number] DOB: [birth year] DOA: 02/13/2020 PCP: [name], MD   Brief Narrative: Taken from H&P Timothy Maddox is a 74 y.o. 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  Pt has a h/o prostate CA, urethral stricture. He is s/p of recent cystoscopy and foley placement by Dr. [name] on 02/07/20. On arrival he was hypotensive which improved with IV fluids, UA looks infected, COVID-19 negative, worsening renal function.  CT renal stone study was concerning for multiple mets involving lung, osseous mets and liver.  Moderate bilateral hydronephrosis to the level of UVJ bilaterally and multiple layering bladder stones surrounding the Foley catheter tip. CTA was obtained due to elevated D-dimer and came back positive for PE, without any CT evidence of right heart strain, patient was started on IV heparin. Developed new onset A. fib with RVR-started on Cardizem infusion, overnight converted back to sinus rhythm and Cardizem infusion was discontinued today.  Cardiology was also consulted.  Subjective: Pt reportedly had a BM overnight.  No increase in urine output despite giving IV lasix last night, and Cr trending up.  Bladder scan revealed >600 ml, and Foley flushed and nothing returned.  Urology contacted who ok'ed with nursing to exchange Foley, with immediate return of 1300 ml.  Pt felt much relief afterwards.   Assessment & Plan:   Principal Problem:   Urinary tract infection associated with indwelling urethral catheter (HCC) Active Problems:   Stroke ([place])   Seizures ([place])   Prostate cancer (HCC)   Hypotension   Hypertension   Type II diabetes mellitus with renal manifestations (HCC)   AKI (acute kidney injury) ([place])   Chronic diastolic CHF (congestive heart failure) (HCC)   Atrial fibrillation  with RVR (HCC)   PE (pulmonary thromboembolism) (HCC)   Liver mass   Acute hypoxic respiratory failure --While working with PT to ambulate, O2 sat noted to drop to 84% on RA. --CXR 10/16 no acute finding except for low lung volumes and known mets. --Trial Lasix 40 mg x1  --cont suppl O2 and wean as able  Acute urinary retention --Bladder scan this morning revealed >600 ml, and existing Foley flushed and nothing returned.  [facility] Dr. Jeffie Pollock contacted who ok'ed with nursing to exchange Foley. PLAN: --Foley exchange by nurse, with immediate return of 1300 ml. --Urology to see pt tomorrow   Complicated UTI/hydronephrosis/multiple bladder stones.  Recent cystoscopy.  Blood cultures negative so far.  Urinary cultures pos for Staphylococcus epidermidis, questionable significance.   --completed a course of ceftriaxone  # Penile pain and likely Urethral injury # Hematuria After pulling on Foley catheter. -Informed urology.  Prostate cancer with mets Patient was waiting for PET scan for concern of recurrence of bladder cancer.  CT renal stone study and CTA are concerning for multiple mets including lungs, liver and bones. --Oncology consulted, Dr. [name] following --liver biopsy on 10/15 PLAN: -continue Nubeqa.  New onset A. fib with RVR.   Most likely secondary to PE.  Cardiology was consulted and he was started on Cardizem infusion.  --initiated amiodarone oral loading dose per cardiology.  --HR went up to 110-140's Afib evening 10/13, so started amiodarone gtt, per cardiology rec.  Amiodarone gtt was stopped early this morning because night-team felt pt's HR was already down to 60's.   --another episode of Afib  RVR night of 10/15 PLAN: --cont amiodarone oral 400 mg BID --cont metop 50 mg BID --cont current regimen, per cards --continue Eliquis  Acute Pulmonary embolism.   CTA was done due to elevated D-dimer above 4000.  It was positive for PE.  Patient is high risk secondary  to extensive malignancy.  Started on heparin infusion and then transitioned to Eliquis PLAN: --cont Eliquis  Anemia, iron def Hemoglobin dropped to 10.8 from 12.3.  Most likely secondary to hematuria.  Anemia panel consistent with mild iron deficiency along with anemia of chronic disease.   PLAN: --cont iron suppl  History of CVA.   No acute concern. -Continue Lipitor  History of seizure disorder. -Continue home dose of Keppra. -Continue with seizure precautions -As needed Ativan  Hypertension.   Blood pressure within goal today. -Continue metoprolol. -Holding home dose of spironolactone and Hyzaar. -Continue IV hydralazine as needed  AKI, worsened  CKD stage IIIa.   Baseline creatinine around 1.2-1.6.  Stable today around 1.73.  Patient appears dehydrated and hypotensive on admission, s/p MIVF --Cr worsened to 3.45 this morning, pt found to have urinary retention on his current Foley PLAN: --exchange Foley --renal US -Continue to hold Cozaar and spironolactone.  Chronic diastolic heart failure.  Prior echo done in 2017 with normal EF and grade 1 diastolic dysfunction.  Patient appears euvolemic. PLAN: -Holding home dose of spironolactone. -Continue metoprolol  Type II diabetes mellitus with renal manifestations [facility]): Recent A1c 6.3, well controlled.  Patient taking Metformin at home. --BG has been within goal for inpatient, and pt hasn't required more than 1 or 2 units of insulin per day --no need for fingersticks  Abdominal distention --KUB today showed increased bowel gas, but no stool burden, nor bowel obstruction --Miralax and Senna to stimulate bowel movement --simethicone PRN   Objective: Vitals:   02/19/20 0600 02/19/20 0732 02/19/20 1300 02/19/20 1608  BP:  130/82 140/84 129/78  Pulse:  74 72 75  Resp:  18 16 18   Temp: 98 F (36.7 C) 98.2 F (36.8 C) 97.9 F (36.6 C) 97.9 F (36.6 C)  TempSrc: Oral Oral Oral   SpO2:  94% 92% (!) 89%  Weight:        Height:        Intake/Output Summary (Last 24 hours) at 02/19/2020 1713 Last data filed at 02/19/2020 0548 Gross per 24 hour  Intake 720 ml  Output 250 ml  Net 470 ml   Filed Weights   02/17/20 0346 02/18/20 0357 02/19/20 0526  Weight: 120 kg 116.4 kg 120.4 kg    Examination: Constitutional: NAD, AAOx3 HEENT: conjunctivae and lids normal, EOMI CV: RRR no M,R,G. Distal pulses +2.  No cyanosis.   RESP: shallow breaths, on 2L  GI: +BS, NT, distended Extremities: No effusions, edema in BLE SKIN: warm, dry and intact Neuro: II - XII grossly intact.  Sensation intact  Foley cath present, no urine return   DVT prophylaxis: eliquis Code Status: Full Family Communication:  Disposition Plan:  Status is: Inpatient  The patient is from: home Anticipated d/c is to: home with [facility] Anticipated d/c date is: 2-3 days Patient currently is not medically stable to d/c due to: developed acute hypoxic respiratory failure today, O2 dropped with ambulation.  Also with worsening AKI with urinary retention while on Foley.   Consultants:   Urology  Cardiology  Procedures:  Antimicrobials:  Ceftriaxone  Data Reviewed: I have personally reviewed following labs and imaging studies  CBC: Recent Labs  Lab 02/15/20 0709 02/16/20 0332 02/17/20 0350 02/18/20 0435 02/19/20 0319  WBC 11.3* 9.8 10.3 11.4* 15.0*  HGB 10.6* 10.3* 10.0* 9.4* 10.1*  HCT 32.2* 31.0* 29.2* 28.6* 30.4*  MCV 91.2 88.6 88.5 89.9 88.9  PLT 261 269 292 230 409   Basic Metabolic Panel: Recent Labs  Lab 02/15/20 0709 02/15/20 0709 02/16/20 0332 02/17/20 0350 02/17/20 2333 02/18/20 0435 02/19/20 0319  NA 137  --  137 137  --  133* 135  K 4.1   < > 3.9 4.0 4.0 4.0 5.0  CL 105  --  105 106  --  103 104  CO2 19*  --  20* 20*  --  19* 16*  GLUCOSE 125*  --  128* 140*  --  158* 138*  BUN 53*  --  42* 40*  --  39* 60*  CREATININE 2.50*  --  1.72* 1.93*  --  1.72* 3.45*  CALCIUM 9.4  --  8.9 9.1  --  9.0 9.1   MG  --   --  2.4 2.2 1.9 2.5* 2.6*   < > = values in this interval not displayed.   GFR: Estimated Creatinine Clearance: 25.9 mL/min (A) (by C-G formula based on SCr of 3.45 mg/dL (H)). Liver Function Tests: No results for input(s): AST, ALT, ALKPHOS, BILITOT, PROT, ALBUMIN in the last 168 hours. No results for input(s): LIPASE, AMYLASE in the last 168 hours. No results for input(s): AMMONIA in the last 168 hours. Coagulation Profile: Recent Labs  Lab 02/13/20 1022 02/17/20 0350  INR 1.2 1.2   Cardiac Enzymes: No results for input(s): CKTOTAL, CKMB, CKMBINDEX, TROPONINI in the last 168 hours. BNP (last 3 results) No results for input(s): PROBNP in the last 8760 hours. HbA1C: No results for input(s): HGBA1C in the last 72 hours. CBG: Recent Labs  Lab 02/14/20 2038 02/15/20 0803 02/15/20 1139 02/15/20 1648 02/15/20 2053  GLUCAP 137* 119* 145* 130* 163*   Lipid Profile: No results for input(s): CHOL, HDL, LDLCALC, TRIG, CHOLHDL, LDLDIRECT in the last 72 hours. Thyroid Function Tests: No results for input(s): TSH, T4TOTAL, FREET4, T3FREE, THYROIDAB in the last 72 hours. Anemia Panel: No results for input(s): VITAMINB12, FOLATE, FERRITIN, TIBC, IRON, RETICCTPCT in the last 72 hours. Sepsis Labs: Recent Labs  Lab 02/13/20 1022 02/13/20 1437 02/19/20 0319  PROCALCITON 0.42  --  0.75  LATICACIDVEN 3.4* 2.0*  --     Recent Results (from the past 240 hour(s))  Urine culture     Status: Abnormal   Collection Time: 02/13/20  8:46 AM   Specimen: Urine, Catheterized  Result Value Ref Range Status   Specimen Description   Final    URINE, CATHETERIZED Performed at [facility], [address] [place], [place] [address]    Special Requests   Final    NONE Performed at [facility], [place], [place] [address]    Culture 50,000 COLONIES/mL STAPHYLOCOCCUS EPIDERMIDIS (A)  Final   Report Status 02/15/2020 FINAL  Final   Organism ID,  Bacteria STAPHYLOCOCCUS EPIDERMIDIS (A)  Final      Susceptibility   Staphylococcus epidermidis - MIC*    CIPROFLOXACIN >=8 RESISTANT Resistant     GENTAMICIN <=0.5 SENSITIVE Sensitive     NITROFURANTOIN <=16 SENSITIVE Sensitive     OXACILLIN <=0.25 SENSITIVE Sensitive     TETRACYCLINE 2 SENSITIVE Sensitive     VANCOMYCIN 1 SENSITIVE Sensitive     TRIMETH/SULFA >=320 RESISTANT Resistant     CLINDAMYCIN >=  8 RESISTANT Resistant     RIFAMPIN <=0.5 SENSITIVE Sensitive     Inducible Clindamycin NEGATIVE Sensitive     * 50,000 COLONIES/mL STAPHYLOCOCCUS EPIDERMIDIS  Resp Panel by RT PCR (RSV, Flu A&B, Covid) - Nasopharyngeal Swab     Status: None   Collection Time: 02/13/20  9:59 AM   Specimen: Nasopharyngeal Swab  Result Value Ref Range Status   SARS Coronavirus 2 by RT PCR NEGATIVE NEGATIVE Final    Comment: (NOTE) SARS-CoV-2 target nucleic acids are NOT DETECTED.  The SARS-CoV-2 RNA is generally detectable in upper respiratoy specimens during the acute phase of infection. The lowest concentration of SARS-CoV-2 viral copies this assay can detect is 131 copies/mL. A negative result does not preclude SARS-Cov-2 infection and should not be used as the sole basis for treatment or other patient management decisions. A negative result may occur with  improper specimen collection/handling, submission of specimen other than nasopharyngeal swab, presence of viral mutation(s) within the areas targeted by this assay, and inadequate number of viral copies (<131 copies/mL). A negative result must be combined with clinical observations, patient history, and epidemiological information. The expected result is Negative.  Fact Sheet for Patients:  PinkCheek.be  Fact Sheet for Healthcare Providers:  GravelBags.it  This test is no t yet approved or cleared by the [place] FDA and  has been authorized for detection and/or diagnosis of  SARS-CoV-2 by FDA under an Emergency Use Authorization (EUA). This EUA will remain  in effect (meaning this test can be used) for the duration of the COVID-19 declaration under Section 564(b)(1) of the Act, 21 U.S.C. section 360bbb-3(b)(1), unless the authorization is terminated or revoked sooner.     Influenza A by PCR NEGATIVE NEGATIVE Final   Influenza B by PCR NEGATIVE NEGATIVE Final    Comment: (NOTE) The Xpert Xpress SARS-CoV-2/FLU/RSV assay is intended as an aid in  the diagnosis of influenza from Nasopharyngeal swab specimens and  should not be used as a sole basis for treatment. Nasal washings and  aspirates are unacceptable for Xpert Xpress SARS-CoV-2/FLU/RSV  testing.  Fact Sheet for Patients: PinkCheek.be  Fact Sheet for Healthcare Providers: GravelBags.it  This test is not yet approved or cleared by the [place] FDA and  has been authorized for detection and/or diagnosis of SARS-CoV-2 by  FDA under an Emergency Use Authorization (EUA). This EUA will remain  in effect (meaning this test can be used) for the duration of the  Covid-19 declaration under Section 564(b)(1) of the Act, 21  U.S.C. section 360bbb-3(b)(1), unless the authorization is  terminated or revoked.    Respiratory Syncytial Virus by PCR NEGATIVE NEGATIVE Final    Comment: (NOTE) Fact Sheet for Patients: PinkCheek.be  Fact Sheet for Healthcare Providers: GravelBags.it  This test is not yet approved or cleared by the [place] FDA and  has been authorized for detection and/or diagnosis of SARS-CoV-2 by  FDA under an Emergency Use Authorization (EUA). This EUA will remain  in effect (meaning this test can be used) for the duration of the  COVID-19 declaration under Section 564(b)(1) of the Act, 21 U.S.C.  section 360bbb-3(b)(1), unless the authorization is terminated or    revoked. Performed at [facility], [address] [place], [place] [address]   Culture, blood (Routine X 2) w Reflex to ID Panel     Status: None   Collection Time: 02/13/20 10:22 AM   Specimen: BLOOD  Result Value Ref Range Status   Specimen Description BLOOD  LEFT HAND  Final   Special Requests   Final    BOTTLES DRAWN AEROBIC AND ANAEROBIC Blood Culture results may not be optimal due to an inadequate volume of blood received in culture bottles   Culture   Final    NO GROWTH 5 DAYS Performed at [facility], [address], [place], [place] [address]    Report Status 02/18/2020 FINAL  Final  Culture, blood (Routine X 2) w Reflex to ID Panel     Status: None   Collection Time: 02/13/20 10:30 AM   Specimen: BLOOD  Result Value Ref Range Status   Specimen Description BLOOD LEFT ARM  Final   Special Requests   Final    BOTTLES DRAWN AEROBIC AND ANAEROBIC Blood Culture adequate volume   Culture   Final    NO GROWTH 5 DAYS Performed at [facility], [address], [place], [place] [address]    Report Status 02/18/2020 FINAL  Final     Radiology Studies: DG Abd 1 View  Result Date: 02/18/2020 CLINICAL DATA:  Dyspnea and abdominal pain. Per physician notes - States he feels dyspnea with exertion as he recently used the restroom with Physical Therapy. Denies chest pain. Patient also denies any symptoms when he went into A fib with RVR last evening. Hx - prostate cancer, DM, HTN, PNA, former smoker. EXAM: ABDOMEN - 1 VIEW COMPARISON:  CT, 02/13/2020 FINDINGS: Increased bowel gas, but no bowel dilation to suggest obstruction. A catheter projects in the lower pelvis consistent with a urinary catheter. There are 3 radiation therapy markers projecting in the region of the prostate gland. A calcification is noted in the right upper quadrant consistent with a gallstone as was noted on the recent CT. There are scattered aortic and iliac atherosclerotic vascular  calcifications. Soft tissues are poorly defined but otherwise unremarkable. No acute skeletal abnormality. Lung base nodules are incompletely imaged, better noted on the recent prior CT. IMPRESSION: 1. No acute findings.  No evidence of bowel obstruction. Electronically Signed   By: Lajean Manes M.D.   On: 02/18/2020 17:17   DG Chest Port 1 View  Result Date: 02/18/2020 CLINICAL DATA:  Dyspnea, abdominal pain EXAM: PORTABLE CHEST 1 VIEW COMPARISON:  Chest radiographs, 11/13/2015, CT chest 02/13/2020 FINDINGS: The heart size and mediastinal contours are within normal limits. Low volume AP portable examination without acute abnormality. Multiple bilateral pulmonary masses and nodules. The visualized skeletal structures are unremarkable. IMPRESSION: 1. No acute abnormality of the lungs in low volume AP portable examination. 2. Multiple bilateral pulmonary masses and nodules as seen on prior CT and in keeping with metastatic disease. Electronically Signed   By: Eddie Candle M.D.   On: 02/18/2020 17:15    Scheduled Meds: . amiodarone  400 mg Oral BID  . apixaban  10 mg Oral BID   Followed by  . [START ON 02/24/2020] apixaban  5 mg Oral BID  . cephALEXin  500 mg Oral Q8H  . Chlorhexidine Gluconate Cloth  6 each Topical Daily  . diltiazem  5 mg Intravenous Once  . ferrous sulfate  325 mg Oral BID WC  . levETIRAcetam  500 mg Oral BID  . metoprolol tartrate  50 mg Oral BID  . rosuvastatin  5 mg Oral QHS  . senna-docusate  2 tablet Oral QHS   Continuous Infusions:    LOS: 6 days    Enzo Bi, MD [facility]  If 7PM-7AM, please contact night-coverage [email or web address]  02/19/2020, 5:13 PM

## 2020-02-19 NOTE — Progress Notes (Signed)
SUBJECTIVE: Patient resting comfortably in bed. Patient denies chest pain. Patient has complaints of bloating. Denies shortness of breath, but is currently on oxygen via [place].   Vitals:   02/18/20 2050 02/19/20 0526 02/19/20 0600 02/19/20 0732  BP:  126/82  130/82  Pulse:  72  74  Resp:  20  18  Temp: 98.4 F (36.9 C) (!) 97.5 F (36.4 C) 98 F (36.7 C) 98.2 F (36.8 C)  TempSrc: Oral Oral Oral Oral  SpO2:  95%  94%  Weight:  120.4 kg    Height:        Intake/Output Summary (Last 24 hours) at 02/19/2020 1205 Last data filed at 02/19/2020 0548 Gross per 24 hour  Intake 720 ml  Output 725 ml  Net -5 ml    LABS: Basic Metabolic Panel: Recent Labs    02/18/20 0435 02/19/20 0319  NA 133* 135  K 4.0 5.0  CL 103 104  CO2 19* 16*  GLUCOSE 158* 138*  BUN 39* 60*  CREATININE 1.72* 3.45*  CALCIUM 9.0 9.1  MG 2.5* 2.6*   Liver Function Tests: No results for input(s): AST, ALT, ALKPHOS, BILITOT, PROT, ALBUMIN in the last 72 hours. No results for input(s): LIPASE, AMYLASE in the last 72 hours. CBC: Recent Labs    02/18/20 0435 02/19/20 0319  WBC 11.4* 15.0*  HGB 9.4* 10.1*  HCT 28.6* 30.4*  MCV 89.9 88.9  PLT 230 275   Cardiac Enzymes: No results for input(s): CKTOTAL, CKMB, CKMBINDEX, TROPONINI in the last 72 hours. BNP: Invalid input(s): POCBNP D-Dimer: No results for input(s): DDIMER in the last 72 hours. Hemoglobin A1C: No results for input(s): HGBA1C in the last 72 hours. Fasting Lipid Panel: No results for input(s): CHOL, HDL, LDLCALC, TRIG, CHOLHDL, LDLDIRECT in the last 72 hours. Thyroid Function Tests: No results for input(s): TSH, T4TOTAL, T3FREE, THYROIDAB in the last 72 hours.  Invalid input(s): FREET3 Anemia Panel: No results for input(s): VITAMINB12, FOLATE, FERRITIN, TIBC, IRON, RETICCTPCT in the last 72 hours.   PHYSICAL EXAM General: Well developed, well nourished, in no acute distress HEENT:  Normocephalic and atramatic Neck:  No JVD.   Lungs: Clear bilaterally to auscultation and percussion. Heart: HRRR . Normal S1 and S2 without gallops or murmurs.  Abdomen: Bowel sounds are positive, abdomen soft and non-tender  Msk:  Back normal, normal gait. Normal strength and tone for age. Extremities: No clubbing, cyanosis or edema.   Neuro: Alert and oriented X 3. Psych:  Good affect, responds appropriately  TELEMETRY: NSR. 66/bpm  ASSESSMENT AND PLAN: Patient presenting to the emergency room with hematuria and lightheadedness found to have a UTI and pulmonary embolus who then developed atrial fibrillation with rapid ventricular rate. Patient remains in NSR. Continue Amiodarone 400mg  BID, metoprolol tartrate 50mg  bid, and eliquis. Patient given Lasix yesterday d/t DOE and O2 requirements with minimal urine output. Kidney function has worsened from Cr 1.72 to 3.45. Bedside RN states bladder scan revealed 651mL urine and that Urology is going to be consulted again per Dr Billie Ruddy as they had placed his foley. Will continue to follow.     Principal Problem:   Urinary tract infection associated with indwelling urethral catheter (HCC) Active Problems:   Stroke ([place])   Seizures (HCC)   Prostate cancer (HCC)   Hypotension   Hypertension   Type II diabetes mellitus with renal manifestations (HCC)   AKI (acute kidney injury) ([place])   Chronic diastolic CHF (congestive heart failure) (HCC)   Atrial fibrillation  with RVR ([place])   PE (pulmonary thromboembolism) ([place])   Liver mass    Timothy Sill, NP-C 02/19/2020 12:05 PM

## 2020-02-19 NOTE — Progress Notes (Signed)
PHARMACY NOTE:  ANTIMICROBIAL RENAL DOSAGE ADJUSTMENT  Current antimicrobial regimen includes a mismatch between antimicrobial dosage and estimated renal function.  As per policy approved by the Pharmacy & Therapeutics and Medical Executive Committees, the antimicrobial dosage will be adjusted accordingly.  Current antimicrobial dosage:  Cephalexin 500 mg po q6h  Indication: UTI  Renal Function:  Estimated Creatinine Clearance: 25.9 mL/min (A) (by C-G formula based on SCr of 3.45 mg/dL (H)).     Antimicrobial dosage has been changed to:  Cephalexin 500 mg po q8h   Additional comments:   Thank you for allowing pharmacy to be a part of this patient's care.  [place], [facility] 02/19/2020 4:08 PM

## 2020-02-20 ENCOUNTER — Inpatient Hospital Stay: Payer: Medicare HMO

## 2020-02-20 ENCOUNTER — Inpatient Hospital Stay: Payer: Medicare HMO | Admitting: Oncology

## 2020-02-20 DIAGNOSIS — R310 Gross hematuria: Secondary | ICD-10-CM

## 2020-02-20 DIAGNOSIS — Z515 Encounter for palliative care: Secondary | ICD-10-CM | POA: Diagnosis not present

## 2020-02-20 DIAGNOSIS — N179 Acute kidney failure, unspecified: Secondary | ICD-10-CM | POA: Diagnosis not present

## 2020-02-20 DIAGNOSIS — R160 Hepatomegaly, not elsewhere classified: Secondary | ICD-10-CM | POA: Diagnosis not present

## 2020-02-20 DIAGNOSIS — I2699 Other pulmonary embolism without acute cor pulmonale: Secondary | ICD-10-CM | POA: Diagnosis not present

## 2020-02-20 LAB — URINALYSIS, COMPLETE (UACMP) WITH MICROSCOPIC
Bacteria, UA: NONE SEEN
Bilirubin Urine: NEGATIVE
Glucose, UA: 50 mg/dL — AB
Ketones, ur: NEGATIVE mg/dL
Leukocytes,Ua: NEGATIVE
Nitrite: NEGATIVE
Protein, ur: 100 mg/dL — AB
RBC / HPF: >50 RBC/hpf — ABNORMAL HIGH (ref 0–5)
Specific Gravity, Urine: 1.011 (ref 1.005–1.030)
Squamous Epithelial / HPF: NONE SEEN (ref 0–5)
WBC, UA: >50 WBC/hpf — ABNORMAL HIGH (ref 0–5)
pH: 6 (ref 5.0–8.0)

## 2020-02-20 LAB — BASIC METABOLIC PANEL
Anion gap: 16 — ABNORMAL HIGH (ref 5–15)
BUN: 78 mg/dL — ABNORMAL HIGH (ref 8–23)
CO2: 17 mmol/L — ABNORMAL LOW (ref 22–32)
Calcium: 9.4 mg/dL (ref 8.9–10.3)
Chloride: 102 mmol/L (ref 98–111)
Creatinine, Ser: 4.06 mg/dL — ABNORMAL HIGH (ref 0.61–1.24)
GFR, Estimated: 14 mL/min — ABNORMAL LOW (ref >60)
Glucose, Bld: 136 mg/dL — ABNORMAL HIGH (ref 70–99)
Potassium: 5.1 mmol/L (ref 3.5–5.1)
Sodium: 135 mmol/L (ref 135–145)

## 2020-02-20 LAB — CBC
HCT: 32.1 % — ABNORMAL LOW (ref 39.0–52.0)
Hemoglobin: 10.6 g/dL — ABNORMAL LOW (ref 13.0–17.0)
MCH: 30 pg (ref 26.0–34.0)
MCHC: 33 g/dL (ref 30.0–36.0)
MCV: 90.9 fL (ref 80.0–100.0)
Platelets: 304 10*3/uL (ref 150–400)
RBC: 3.53 MIL/uL — ABNORMAL LOW (ref 4.22–5.81)
RDW: 14.9 % (ref 11.5–15.5)
WBC: 18.2 10*3/uL — ABNORMAL HIGH (ref 4.0–10.5)
nRBC: 0 % (ref 0.0–0.2)

## 2020-02-20 LAB — APTT: aPTT: 58 seconds — ABNORMAL HIGH (ref 24–36)

## 2020-02-20 LAB — HEPARIN LEVEL (UNFRACTIONATED): Heparin Unfractionated: >3.6 IU/mL — ABNORMAL HIGH (ref 0.30–0.70)

## 2020-02-20 LAB — GLUCOSE, CAPILLARY: Glucose-Capillary: 139 mg/dL — ABNORMAL HIGH (ref 70–99)

## 2020-02-20 LAB — MAGNESIUM: Magnesium: 2.7 mg/dL — ABNORMAL HIGH (ref 1.7–2.4)

## 2020-02-20 MED ORDER — HEPARIN (PORCINE) 25000 UT/250ML-% IV SOLN
2000.0000 [IU]/h | INTRAVENOUS | Status: DC
  Administered 2020-02-20: 2000 [IU]/h via INTRAVENOUS
  Filled 2020-02-20: qty 250

## 2020-02-20 NOTE — Consult Note (Signed)
ANTICOAGULATION CONSULT NOTE - Initial Consult  Pharmacy Consult for Heparin Indication: pulmonary embolus  Allergies  Allergen Reactions  . Poison Oak Extract Rash  . Poison Ivy Extract Rash    Patient Measurements: Height: 6\' 1"  (185.4 cm) Weight: 118.3 kg (260 lb 11.2 oz) IBW/kg (Calculated) : 79.9 Heparin Dosing Weight: 106.1 kg  Vital Signs: Temp: 97.9 F (36.6 C) (10/18 1458) Temp Source: Oral (10/18 1143) BP: 120/70 (10/18 1458) Pulse Rate: 66 (10/18 1458)  Labs: Recent Labs    02/18/20 0435 02/18/20 0435 02/19/20 0319 02/20/20 0257  HGB 9.4*   < > 10.1* 10.6*  HCT 28.6*  --  30.4* 32.1*  PLT 230  --  275 304  CREATININE 1.72*  --  3.45* 4.06*   < > = values in this interval not displayed.    Estimated Creatinine Clearance: 21.8 mL/min (A) (by C-G formula based on SCr of 4.06 mg/dL (H)).   Medical History: Past Medical History:  Diagnosis Date  . Anxiety   . Arthritis    lower back, shoulders  . AVM (arteriovenous malformation) brain   . Cancer [facility])    prostate- treated with radiation  . CVA (cerebral infarction) 11/2015  . Depression   . Diabetes mellitus without complication ([place])   . Dyspnea    with exertion  . HOH (hard of hearing)   . Hyperlipidemia   . Hypertension   . Pneumonia    1st grade  . Pre-diabetes   . Prediabetes   . Seizures ([place]) 12/2015  . Sleep apnea   . 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 amLODipine (NORVASC) 10 MG tablet Take 10 mg by mouth daily.     Marland Kitchen levETIRAcetam (ROWEEPRA) 500 MG tablet Take 500 mg by mouth 2 (two) times daily.     Marland Kitchen losartan-hydrochlorothiazide (HYZAAR) 100-25 MG tablet Take 1 tablet by mouth daily.  1   . metFORMIN (GLUCOPHAGE-XR) 500 MG 24 hr tablet Take 500 mg by mouth 2 (two) times daily.     . metoprolol tartrate (LOPRESSOR) 100 MG tablet Take 100 mg by mouth 2 (two) times daily.      . NUBEQA 300 MG tablet TAKE 2 TABLETS (600MG ) BY MOUTH TWO TIMES DAILY WITH A MEAL. (Patient taking differently: Take 600 mg by mouth in the morning and at bedtime. ) 120 tablet 3   . oxyCODONE-acetaminophen (PERCOCET/ROXICET) 5-325 MG tablet Take 1 tablet by mouth every 8 (eight) hours as needed for severe pain. 15 tablet 0   . rosuvastatin (CRESTOR) 5 MG tablet Take 5 mg by mouth at bedtime.      Marland Kitchen spironolactone (ALDACTONE) 100 MG tablet Take 100 mg by mouth 2 (two) times daily.     Marland Kitchen acetaminophen (TYLENOL) 500 MG tablet Take 500 mg by mouth every 6 (six) hours as needed (pain.).    PRN at PRN  . tamsulosin (FLOMAX) 0.4 MG CAPS capsule TAKE ONE CAPSULE BY MOUTH ONCE DAILY (Patient not taking: Reported on 02/13/2020) 30 capsule 3 Not Taking at Unknown time   Scheduled:  . amiodarone  400 mg Oral BID  . Chlorhexidine Gluconate Cloth  6 each Topical Daily  . ferrous sulfate  325 mg Oral BID WC  . levETIRAcetam  500 mg Oral BID  . metoprolol tartrate  50 mg Oral BID  . rosuvastatin  5 mg Oral QHS  . senna-docusate  2 tablet Oral QHS   Infusions:   PRN: acetaminophen, acetaminophen-codeine, hydrALAZINE, LORazepam, LORazepam, simethicone, sodium chloride flush  Assessment: 74 yo male who presented to ED with hematuria and lightheadedness. Found to have UTI and pulmonary embolism and later developed Afib with RVR. Baseline labs noted. No anticoagulants PTA.  Pharmacy has been consulted for heparin dosing and monitoring for PE.  Pt started on apixaban. Switching back to heparin due to perc tube placement.   Last dose apixaban ~0900.   Goal of Therapy:  APTT 66-102 seconds until heparin level and aPTT correlate then switch to heparin level (Heparin level 0.3-0.7 units/ml) Monitor platelets by anticoagulation protocol: Yes   Plan:  Start heparin infusion at 2000 units/hr as patient was stable on this regimen previously.  Check aPTT level in 8 hours and daily while on heparin. CBC with AM  labs.  Continue to monitor H&H and platelets  Oswald Hillock, PharmD, BCPS 02/20/2020,8:17 PM

## 2020-02-20 NOTE — Progress Notes (Signed)
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  Ending additional IHC to determine the cell origin.  Over the weekend, patient has had developed obstructive uropathy despite Foley catheter.  Foley catheter was changed yesterday, and catheter is not draining well.  Kidney function has worsened.  Patient now has developed hypoxic respiratory failure and is now on oxygen.    Review of systems- Review of Systems  Constitutional: Positive for fatigue. Negative for chills and fever.  HENT:   Negative for hearing loss and voice change.   Eyes: Negative for eye problems and icterus.  Respiratory: Positive for shortness of breath. Negative for chest tightness and cough.   Cardiovascular: Negative for chest pain and leg swelling.  Gastrointestinal: Negative for abdominal distention and abdominal pain.  Endocrine: Negative for hot flashes.  Genitourinary: Positive for hematuria. Negative for difficulty urinating, dysuria and frequency.   Musculoskeletal: Negative for arthralgias.  Skin: Negative for itching and rash.  Neurological: Negative for light-headedness and numbness.  Hematological: Negative for adenopathy. Does not bruise/bleed easily.  Psychiatric/Behavioral: Negative for confusion.    Allergies  Allergen Reactions  . Poison Oak Extract Rash  . Poison Ivy Extract Rash    Patient Active Problem List   Diagnosis Date Noted  . PE (pulmonary thromboembolism) ([place])   . Liver mass   . Hematuria 02/13/2020  . Hypotension 02/13/2020  . Urinary tract infection associated with indwelling urethral catheter ([place]) 02/13/2020  . Atrial  fibrillation with RVR ([place]) 02/13/2020  . Prediabetes   . Hypertension   . Type II diabetes mellitus with renal manifestations ([place])   . AKI (acute kidney injury) ([place])   . Chronic diastolic CHF (congestive heart failure) ([place])   . Stage 3a chronic kidney disease ([place]) 01/30/2020  . Androgen deprivation therapy 01/30/2020  . Encounter for antineoplastic chemotherapy 09/24/2018  . Weak urine stream 09/24/2018  . Rising PSA level 07/08/2018  . Prostate cancer ([place]) 07/08/2018  . Goals of care, counseling/discussion 12/31/2017  . Osteoarthritis of right knee 07/20/2017  . Seizure ([place]) 12/19/2015  . 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 Seizures ([place]) 12/19/2015  . AVF (arteriovenous fistula) ([place]) 11/16/2015  . ICH (intracerebral hemorrhage) ([place]) 11/16/2015  . Stroke [facility]) 11/13/2015     Past Medical History:  Diagnosis Date  . Anxiety   . Arthritis    lower back, shoulders  . AVM (arteriovenous malformation) brain   . Cancer [facility])    prostate- treated with radiation  . CVA (cerebral infarction) 11/2015  . Depression   . Diabetes mellitus without complication ([place])   . Dyspnea    with exertion  . HOH (hard of hearing)   . Hyperlipidemia   . Hypertension   . Pneumonia    1st grade  . Pre-diabetes   . Prediabetes   . Seizures ([place]) 12/2015  . Sleep apnea   . Stroke [facility])    speech impairment, no weakness     Past Surgical History:  Procedure Laterality Date  . BACK SURGERY  1999   disectomy  . BACK SURGERY     2 surgery- injury  . COLONOSCOPY W/ POLYPECTOMY    . COLONOSCOPY WITH PROPOFOL N/A 02/28/2019  Procedure: COLONOSCOPY WITH PROPOFOL;  Surgeon: Toledo, Benay Pike, MD;  Location: [facility] ENDOSCOPY;  Service: Gastroenterology;  Laterality: N/A;  . CYSTOSCOPY WITH URETHRAL DILATATION Bilateral 02/07/2020   Procedure: CYSTOSCOPY WITH URETHRAL DILATATION;  Surgeon: Abbie Sons, MD;  Location: [facility] ORS;  Service: Urology;  Laterality:  Bilateral;  . HAMMER TOE SURGERY Bilateral   . INGUINAL HERNIA REPAIR Right    per patient when he was in 1st grade  . IR GENERIC HISTORICAL  12/05/2015   IR RADIOLOGIST EVAL & MGMT 12/05/2015 [facility]  . IR GENERIC HISTORICAL  01/31/2016   IR RADIOLOGIST EVAL & MGMT 01/31/2016 [facility]  . IR GENERIC HISTORICAL  02/20/2016   IR ANGIO INTRA EXTRACRAN SEL COM CAROTID INNOMINATE UNI L MOD SED 02/20/2016 [name], MD [facility]  . IR GENERIC HISTORICAL  02/20/2016   IR 3D INDEPENDENT WKST 02/20/2016 [name], MD [facility]  . KNEE SURGERY Bilateral 2015  . RADIOLOGY WITH ANESTHESIA N/A 02/20/2016   Procedure: EMBOLIZATION;  Surgeon: [name], MD;  Location: [place];  Service: Radiology;  Laterality: N/A;  . TOE SURGERY Left 2017   TOE NAIL REMOVAL  . TOTAL KNEE ARTHROPLASTY Right 07/20/2017   Procedure: TOTAL KNEE ARTHROPLASTY;  Surgeon: Lovell Sheehan, MD;  Location: [facility] ORS;  Service: Orthopedics;  Laterality: Right;    Social History   Socioeconomic History  . Marital status: Married    Spouse name: Not on file  . Number of children: Not on file  . Years of education: Not on file  . Highest education level: Not on file  Occupational History  . Occupation: retired Curator  Tobacco Use  . Smoking status: Former Smoker    Packs/day: 1.00    Years: 2.00    Pack years: 2.00    Types: Cigarettes    Quit date: 05/05/1966    Years since quitting: 53.8  . Smokeless tobacco: Never Used  . Tobacco comment: quit age 71  Vaping Use  . Vaping Use: Never used  Substance and Sexual Activity  . Alcohol use: Not Currently  . Drug use: No  . Sexual activity: Not Currently  Other Topics Concern  . Not on file  Social History Narrative  . Not on file   Social Determinants of Health   Financial Resource Strain:   . Difficulty of Paying Living Expenses: Not on file  Food Insecurity:   . Worried About Charity fundraiser in the Last Year: Not on file    . Ran Out of Food in the Last Year: Not on file  Transportation Needs:   . Lack of Transportation (Medical): Not on file  . Lack of Transportation (Non-Medical): Not on file  Physical Activity:   . Days of Exercise per Week: Not on file  . Minutes of Exercise per Session: Not on file  Stress:   . Feeling of Stress : Not on file  Social Connections:   . Frequency of Communication with Friends and Family: Not on file  . Frequency of Social Gatherings with Friends and Family: Not on file  . Attends Religious Services: Not on file  . Active Member of Clubs or Organizations: Not on file  . Attends Archivist Meetings: Not on file  . Marital Status: Not on file  Intimate Partner Violence:   . Fear of Current or Ex-Partner: Not on file  . Emotionally Abused: Not on file  . Physically Abused: Not on file  . Sexually Abused: Not  on file     Family History  Problem Relation Age of Onset  . Heart failure Father   . Diabetes Father   . Hypertension Father   . Alzheimer's disease Father 67  . Other Mother 10       homicide  . Alcohol abuse Mother   . Pancreatic cancer Sister   . Alcohol abuse Sister   . Diabetes Brother   . Diabetes Paternal Aunt   . Diabetes Paternal Grandmother   . Diabetes Paternal Grandfather      Current Facility-Administered Medications:  .  acetaminophen (TYLENOL) tablet 650 mg, 650 mg, Oral, Q6H PRN, Ivor Costa, MD, 650 mg at 02/20/20 0755 .  acetaminophen-codeine (TYLENOL #3) 300-30 MG per tablet 1 tablet, 1 tablet, Oral, Q4H PRN, Earlie Server, MD, 1 tablet at 02/19/20 2331 .  amiodarone (PACERONE) tablet 400 mg, 400 mg, Oral, BID, Adaline Sill, NP, 400 mg at 02/20/20 0855 .  apixaban (ELIQUIS) tablet 10 mg, 10 mg, Oral, BID, 10 mg at 02/20/20 0856 **FOLLOWED BY** [START ON 02/24/2020] apixaban (ELIQUIS) tablet 5 mg, 5 mg, Oral, BID, Enzo Bi, MD .  cephALEXin [facility]) capsule 500 mg, 500 mg, Oral, Q8H, Enzo Bi, MD, 500 mg at 02/20/20 [phone] .   Chlorhexidine Gluconate Cloth 2 % PADS 6 each, 6 each, Topical, Daily, Ivor Costa, MD, 6 each at 02/20/20 0857 .  ferrous sulfate tablet 325 mg, 325 mg, Oral, BID WC, Earlie Server, MD, 325 mg at 02/20/20 0755 .  hydrALAZINE (APRESOLINE) injection 5 mg, 5 mg, Intravenous, Q2H PRN, Ivor Costa, MD .  levETIRAcetam (KEPPRA) tablet 500 mg, 500 mg, Oral, BID, Ivor Costa, MD, 500 mg at 02/20/20 0856 .  LORazepam (ATIVAN) injection 0.5 mg, 0.5 mg, Intravenous, Q12H PRN, Ivor Costa, MD, 0.5 mg at 02/13/20 1431 .  LORazepam (ATIVAN) injection 0.5-1 mg, 0.5-1 mg, Intravenous, Q2H PRN, Ivor Costa, MD .  metoprolol tartrate (LOPRESSOR) tablet 50 mg, 50 mg, Oral, BID, Ivor Costa, MD, 50 mg at 02/20/20 0856 .  rosuvastatin (CRESTOR) tablet 5 mg, 5 mg, Oral, QHS, Ivor Costa, MD, 5 mg at 02/19/20 2321 .  senna-docusate (Senokot-S) tablet 2 tablet, 2 tablet, Oral, QHS, Enzo Bi, MD, 2 tablet at 02/18/20 2129 .  simethicone (MYLICON) chewable tablet 80 mg, 80 mg, Oral, QID PRN, Enzo Bi, MD, 80 mg at 02/18/20 1329 .  sodium chloride flush (NS) 0.9 % injection 3 mL, 3 mL, Intravenous, PRN, Lorella Nimrod, MD, 3 mL at 02/14/20 2314   Physical exam:  Vitals:   02/20/20 0409 02/20/20 0722 02/20/20 1143 02/20/20 1458  BP: 131/80 138/80 106/66 120/70  Pulse: 73 71 63 66  Resp: 19 18 18 19   Temp: 97.8 F (36.6 C) (!) 97.5 F (36.4 C) 98.6 F (37 C) 97.9 F (36.6 C)  TempSrc: Oral Oral Oral   SpO2: 94% 91% 95% 92%  Weight: 260 lb 11.2 oz (118.3 kg)     Height:       Physical Exam Constitutional:      General: He is not in acute distress.    Appearance: He is obese. He is ill-appearing. He is not diaphoretic.  HENT:     Head: Normocephalic and atraumatic.     Nose: Nose normal.     Mouth/Throat:     Pharynx: No oropharyngeal exudate.  Eyes:     General: No scleral icterus.    Pupils: Pupils are equal, round, and reactive to light.  Cardiovascular:     Rate and  Rhythm: Normal rate and regular rhythm.      Heart sounds: No murmur heard.   Pulmonary:     Effort: Pulmonary effort is normal.     Breath sounds: No rales.     Comments: Breathing nasal cannula oxygen Chest:     Chest wall: No tenderness.  Abdominal:     General: There is no distension.     Palpations: Abdomen is soft.     Tenderness: There is no abdominal tenderness.  Genitourinary:    Comments: + Foley catheter Musculoskeletal:        General: Normal range of motion.     Cervical back: Normal range of motion and neck supple.  Skin:    General: Skin is warm and dry.     Findings: No erythema.  Neurological:     Mental Status: He is alert and oriented to person, place, and time.     Cranial Nerves: No cranial nerve deficit.     Motor: No abnormal muscle tone.     Coordination: Coordination normal.  Psychiatric:        Mood and Affect: Affect normal.        CMP Latest Ref Rng & Units 02/20/2020  Glucose 70 - 99 mg/dL 136(H)  BUN 8 - 23 mg/dL 78(H)  Creatinine 0.61 - 1.24 mg/dL 4.06(H)  Sodium 135 - 145 mmol/L 135  Potassium 3.5 - 5.1 mmol/L 5.1  Chloride 98 - 111 mmol/L 102  CO2 22 - 32 mmol/L 17(L)  Calcium 8.9 - 10.3 mg/dL 9.4  Total Protein 6.5 - 8.1 g/dL -  Total Bilirubin 0.3 - 1.2 mg/dL -  Alkaline Phos 38 - 126 U/L -  AST 15 - 41 U/L -  ALT 0 - 44 U/L -   CBC Latest Ref Rng & Units 02/20/2020  WBC 4.0 - 10.5 K/uL 18.2(H)  Hemoglobin 13.0 - 17.0 g/dL 10.6(L)  Hematocrit 39 - 52 % 32.1(L)  Platelets 150 - 400 K/uL 304    RADIOGRAPHIC STUDIES: I have personally reviewed the radiological images as listed and agreed with the findings in the report. DG Abd 1 View  Result Date: 02/18/2020 CLINICAL DATA:  Dyspnea and abdominal pain. Per physician notes - States he feels dyspnea with exertion as he recently used the restroom with Physical Therapy. Denies chest pain. Patient also denies any symptoms when he went into A fib with RVR last evening. Hx - prostate cancer, DM, HTN, PNA, former smoker. EXAM:  ABDOMEN - 1 VIEW COMPARISON:  CT, 02/13/2020 FINDINGS: Increased bowel gas, but no bowel dilation to suggest obstruction. A catheter projects in the lower pelvis consistent with a urinary catheter. There are 3 radiation therapy markers projecting in the region of the prostate gland. A calcification is noted in the right upper quadrant consistent with a gallstone as was noted on the recent CT. There are scattered aortic and iliac atherosclerotic vascular calcifications. Soft tissues are poorly defined but otherwise unremarkable. No acute skeletal abnormality. Lung base nodules are incompletely imaged, better noted on the recent prior CT. IMPRESSION: 1. No acute findings.  No evidence of bowel obstruction. Electronically Signed   By: Lajean Manes M.D.   On: 02/18/2020 17:17   CT ANGIO CHEST PE W OR WO CONTRAST  Result Date: 02/13/2020 CLINICAL DATA:  Atrial fibrillation, elevated D-dimer, prostate cancer EXAM: CT ANGIOGRAPHY CHEST WITH CONTRAST TECHNIQUE: Multidetector CT imaging of the chest was performed using the standard protocol during bolus administration of intravenous contrast. Multiplanar CT image reconstructions  and MIPs were obtained to evaluate the vascular anatomy. CONTRAST:  20mL OMNIPAQUE IOHEXOL 350 MG/ML SOLN COMPARISON:  None. FINDINGS: Cardiovascular: There is adequate opacification of the pulmonary arterial tree. There are multiple branching central intraluminal filling defects identified within the right upper and right lower lobar pulmonary arteries as well as multiple segmental pulmonary arteries of the left lower lobe in keeping with acute pulmonary embolism. The central pulmonary arteries are enlarged in keeping with changes of pulmonary arterial hypertension. However, there is no CT evidence of right heart strain with the right to left ventricular ratio within normal limits. Moderate coronary artery calcification. Cardiac size is mildly enlarged. Moderate calcification of the mitral  valve annulus. No pericardial effusion. The thoracic aorta is of normal caliber. Mild atherosclerotic calcification is seen within the aortic arch and descending thoracic aorta. Mediastinum/Nodes: There is pathologic bilateral hilar adenopathy. Index lymph node within the right hilum measures 16 mm x 25 mm at axial image # 42/4. Thyroid unremarkable. Esophagus unremarkable. Lungs/Pleura: Multiple bilateral pulmonary masses are identified most in keeping with pulmonary metastatic disease in this patient with a known history of prostate cancer. Index lesion within the right lower lobe measures 3.5 x 5.2 cm at axial image # 62/6. At least 50 nodules are seen scattered throughout the lungs bilaterally. No pneumothorax or pleural effusion. The central airways are widely patent. Upper Abdomen: Unremarkable Musculoskeletal: Multiple sclerotic metastases are identified within the visualized axial skeleton involving the scapula bilaterally, the sternum, the thoracic spine, and multiple ribs bilaterally. No pathologic fracture. Review of the MIP images confirms the above findings. IMPRESSION: Acute pulmonary embolism. Moderate embolic burden. No CT evidence of right heart strain. Moderate coronary artery calcification.  Mild global cardiomegaly. Innumerable bilateral pulmonary masses and nodules as well as bilateral pathologic hilar adenopathy and widespread sclerotic osseous metastases in keeping with visceral and osseous metastatic disease related to the patient's underlying prostate cancer. Aortic Atherosclerosis (ICD10-I70.0). Electronically Signed   By: Fidela Salisbury MD   On: 02/13/2020 23:27   US RENAL  Result Date: 02/19/2020 CLINICAL DATA:  Acute renal injury EXAM: RENAL / URINARY TRACT ULTRASOUND COMPLETE COMPARISON:  CT from 02/13/2020 FINDINGS: Right Kidney: Renal measurements: 13.0 x 6.8 x 5.7 cm. = volume: 266 mL. Mild to moderate hydronephrosis is noted. Left Kidney: Renal measurements: 14.8 x 7.1 x 6.1  cm. = volume: 333 mL. Moderate to severe hydronephrosis is noted on the left. Additionally there is a 3.9 cm cystic lesion in the lower pole which appears simple in nature. Bladder: Decompressed by Foley catheter. Multiple bladder calculi are again identified and stable. Other: None. IMPRESSION: Stable appearing hydronephrosis bilaterally worse on the left than the right this is stable from prior CT examination. Left renal cyst. Multiple bladder calculi. Electronically Signed   By: Inez Catalina M.D.   On: 02/19/2020 20:12   US BIOPSY (LIVER)  Result Date: 02/17/2020 INDICATION: 74 year old with history of prostate cancer and evidence for metastatic disease in the chest and liver. Tissue diagnosis is needed. EXAM: ULTRASOUND-GUIDED LIVER LESION BIOPSY MEDICATIONS: None. ANESTHESIA/SEDATION: Moderate (conscious) sedation was employed during this procedure. A total of Versed 1.0 mg and Fentanyl 100 mcg was administered intravenously. Moderate Sedation Time: 16 minutes. The patient's level of consciousness and vital signs were monitored continuously by radiology nursing throughout the procedure under my direct supervision. FLUOROSCOPY TIME:  None COMPLICATIONS: None immediate. PROCEDURE: Informed written consent was obtained from the patient after a thorough discussion of the procedural risks, benefits and alternatives. All  questions were addressed. A timeout was performed prior to the initiation of the procedure. The liver was evaluated with ultrasound. Hypoechoic lesion along the inferior right hepatic lobe was identified and targeted for biopsy. The right side of the abdomen was prepped with chlorhexidine and sterile field was created. Skin and soft tissues were anesthetized using 1% lidocaine. Small incision was made. Using ultrasound guidance, 17 gauge coaxial needle was directed into the right hepatic lobe and into the hypoechoic lesion. Total of 3 core biopsies were obtained with 18 gauge core device.  Gel-Foam slurry was injected through the 17 gauge coaxial needle as it was removed. Bandage placed over the puncture site. FINDINGS: Scattered hypoechoic lesions in the liver. Lesion in the inferior right hepatic lobe was sampled. Biopsy needle was confirmed within the lesion. Adequate specimens were obtained and placed in formalin. No immediate bleeding or hematoma formation. IMPRESSION: Ultrasound-guided core biopsy of right hepatic lesion. Electronically Signed   By: Markus Daft M.D.   On: 02/17/2020 16:16   US Venous Img Lower Bilateral (DVT)  Result Date: 02/14/2020 CLINICAL DATA:  Pulmonary embolus EXAM: LEFT LOWER EXTREMITY VENOUS DOPPLER ULTRASOUND TECHNIQUE: Gray-scale sonography with graded compression, as well as color Doppler and duplex ultrasound were performed to evaluate the lower extremity deep venous systems from the level of the common femoral vein and including the common femoral, femoral, profunda femoral, popliteal and calf veins including the posterior tibial, peroneal and gastrocnemius veins when visible. The superficial great saphenous vein was also interrogated. Spectral Doppler was utilized to evaluate flow at rest and with distal augmentation maneuvers in the common femoral, femoral and popliteal veins. COMPARISON:  None. FINDINGS: Contralateral Common Femoral Vein: Respiratory phasicity is normal and symmetric with the symptomatic side. No evidence of thrombus. Normal compressibility. Common Femoral Vein: No evidence of thrombus. Normal compressibility, respiratory phasicity and response to augmentation. Saphenofemoral Junction: No evidence of thrombus. Normal compressibility and flow on color Doppler imaging. Profunda Femoral Vein: No evidence of thrombus. Normal compressibility and flow on color Doppler imaging. Femoral Vein: No evidence of thrombus. Normal compressibility, respiratory phasicity and response to augmentation. Popliteal Vein: No evidence of thrombus. Normal  compressibility, respiratory phasicity and response to augmentation. Calf Veins: There is nonocclusive thrombus within the left peroneal veins. Superficial Great Saphenous Vein: No evidence of thrombus. Normal compressibility. Venous Reflux:  None. Other Findings:  None. IMPRESSION: Nonocclusive thrombus within the left peroneal veins. Electronically Signed   By: Ulyses Jarred M.D.   On: 02/14/2020 01:36   DG Chest Port 1 View  Result Date: 02/18/2020 CLINICAL DATA:  Dyspnea, abdominal pain EXAM: PORTABLE CHEST 1 VIEW COMPARISON:  Chest radiographs, 11/13/2015, CT chest 02/13/2020 FINDINGS: The heart size and mediastinal contours are within normal limits. Low volume AP portable examination without acute abnormality. Multiple bilateral pulmonary masses and nodules. The visualized skeletal structures are unremarkable. IMPRESSION: 1. No acute abnormality of the lungs in low volume AP portable examination. 2. Multiple bilateral pulmonary masses and nodules as seen on prior CT and in keeping with metastatic disease. Electronically Signed   By: Eddie Candle M.D.   On: 02/18/2020 17:15   DG OR UROLOGY CYSTO IMAGE ([place])  Result Date: 02/07/2020 There is no interpretation for this exam.  This order is for images obtained during a surgical procedure.  Please See "Surgeries" Tab for more information regarding the procedure.   ECHOCARDIOGRAM COMPLETE  Result Date: 02/14/2020    ECHOCARDIOGRAM REPORT   Patient Name:   JAIVON VANBEEK  Date of Exam: 02/14/2020 Medical Rec #:  [id number]       Height:       73.0 in Accession #:    [id number]      Weight:       270.0 lb Date of Birth:  [birth year]        BSA:          2.444 m Patient Age:    65 years        BP:           132/71 mmHg Patient Gender: M               HR:           70 bpm. Exam Location:  [facility] Procedure: 2D Echo, Color Doppler, Cardiac Doppler and Intracardiac            Opacification Agent Indications:     I48.91 Atrial Fibrillation  History:          Patient has prior history of Echocardiogram examinations.                  Stroke; Risk Factors:Sleep Apnea, Hypertension, Dyslipidemia                  and Diabetes.  Sonographer:     Charmayne Sheer RDCS (AE) Referring Phys:  [id number] [place] Diagnosing Phys: Neoma Laming MD  Sonographer Comments: Technically difficult study due to poor echo windows. Image acquisition challenging due to patient body habitus. IMPRESSIONS  1. Left ventricular ejection fraction, by estimation, is 60 to 65%. The left ventricle has normal function. The left ventricle has no regional wall motion abnormalities. There is mild left ventricular hypertrophy. Left ventricular diastolic parameters are consistent with Grade I diastolic dysfunction (impaired relaxation).  2. Right ventricular systolic function is normal. The right ventricular size is normal.  3. Left atrial size was mildly dilated.  4. Right atrial size was mildly dilated.  5. The mitral valve is normal in structure. No evidence of mitral valve regurgitation. No evidence of mitral stenosis.  6. The aortic valve is normal in structure. Aortic valve regurgitation is not visualized. Mild aortic valve sclerosis is present, with no evidence of aortic valve stenosis.  7. The inferior vena cava is normal in size with greater than 50% respiratory variability, suggesting right atrial pressure of 3 mmHg. FINDINGS  Left Ventricle: Left ventricular ejection fraction, by estimation, is 60 to 65%. The left ventricle has normal function. The left ventricle has no regional wall motion abnormalities. Definity contrast agent was given IV to delineate the left ventricular  endocardial borders. The left ventricular internal cavity size was normal in size. There is mild left ventricular hypertrophy. Left ventricular diastolic parameters are consistent with Grade I diastolic dysfunction (impaired relaxation). Right Ventricle: The right ventricular size is normal. No increase in right ventricular  wall thickness. Right ventricular systolic function is normal. Left Atrium: Left atrial size was mildly dilated. Right Atrium: Right atrial size was mildly dilated. Pericardium: There is no evidence of pericardial effusion. Mitral Valve: The mitral valve is normal in structure. No evidence of mitral valve regurgitation. No evidence of mitral valve stenosis. MV peak gradient, 6.0 mmHg. The mean mitral valve gradient is 2.0 mmHg. Tricuspid Valve: The tricuspid valve is normal in structure. Tricuspid valve regurgitation is not demonstrated. No evidence of tricuspid stenosis. Aortic Valve: The aortic valve is normal in structure. Aortic valve regurgitation is not visualized. Mild aortic valve sclerosis is  present, with no evidence of aortic valve stenosis. Aortic valve mean gradient measures 3.0 mmHg. Aortic valve peak gradient measures 7.3 mmHg. Pulmonic Valve: The pulmonic valve was normal in structure. Pulmonic valve regurgitation is not visualized. No evidence of pulmonic stenosis. Aorta: The aortic root is normal in size and structure. Venous: The inferior vena cava is normal in size with greater than 50% respiratory variability, suggesting right atrial pressure of 3 mmHg. IAS/Shunts: No atrial level shunt detected by color flow Doppler.   LV Volumes (MOD) LV vol d, MOD A4C: 91.4 ml Diastology LV vol s, MOD A4C: 26.8 ml LV e' medial:    8.81 cm/s LV SV MOD A4C:     91.4 ml LV E/e' medial:  7.1                            LV e' lateral:   12.40 cm/s                            LV E/e' lateral: 5.0  AORTIC VALVE AV Vmax:           135.00 cm/s AV Vmean:          84.100 cm/s AV VTI:            0.227 m AV Peak Grad:      7.3 mmHg AV Mean Grad:      3.0 mmHg LVOT Vmax:         84.20 cm/s LVOT Vmean:        56.100 cm/s LVOT VTI:          0.108 m LVOT/AV VTI ratio: 0.48 MITRAL VALVE MV Area (PHT): 4.49 cm    SHUNTS MV Peak grad:  6.0 mmHg    Systemic VTI: 0.11 m MV Mean grad:  2.0 mmHg MV Vmax:       1.22 m/s MV Vmean:       64.5 cm/s MV Decel Time: 169 msec MV E velocity: 62.60 cm/s MV A velocity: 85.30 cm/s MV E/A ratio:  0.73 Neoma Laming MD Electronically signed by Neoma Laming MD Signature Date/Time: 02/14/2020/4:30:36 PM    Final    CT Renal Stone Study  Result Date: 02/13/2020 CLINICAL DATA:  Prostate cancer. Pink colored urine. Hypotension. Flank pain. EXAM: CT ABDOMEN AND PELVIS WITHOUT CONTRAST TECHNIQUE: Multidetector CT imaging of the abdomen and pelvis was performed following the standard protocol without IV contrast. COMPARISON:  12/23/2017 CT abdomen/pelvis. FINDINGS: Lower chest: Numerous (greater than 20) solid pulmonary nodules and masses at both lung bases, largest 5.2 cm in the basilar right lower lobe (series 4/image 27) and 5.5 cm in the basilar left lower lobe (series 4/image 27), all new. Atherosclerosis. Hepatobiliary: Multiple (at least 5) hypodense liver masses scattered throughout the liver, largest 3.8 cm in the segment 6 right liver lobe (series 2/image 36) and 2.0 cm in the superior right liver (series 2/image 22), all new. Cholelithiasis. No gallbladder wall thickening or pericholecystic fluid. No biliary ductal dilatation. Pancreas: Normal, with no mass or duct dilation. Spleen: Normal size. No mass. Adrenals/Urinary Tract: Normal right adrenal. Left adrenal 1.5 cm nodule with density 17 HU, stable, most compatible with an adenoma. Moderate bilateral hydroureteronephrosis to the level of the ureterovesical junctions bilaterally. No renal stones. No ureteral stones. Simple 3.7 cm posterior lower left renal cyst. No additional contour deforming renal lesions. Multiple layering stones in distended bladder, largest  19 mm. Chronic mild diffuse bladder wall thickening is unchanged. Foley catheter in place with balloon in the dependent bladder. Stomach/Bowel: Normal non-distended stomach. Normal caliber small bowel with no small bowel wall thickening. Normal appendix. Normal large bowel with no  diverticulosis, large bowel wall thickening or pericolonic fat stranding. Vascular/Lymphatic: Atherosclerotic nonaneurysmal abdominal aorta. No pathologically enlarged lymph nodes in the abdomen or pelvis. Reproductive: Newly mildly enlarged, irregular and poorly marginated prostate, directly contiguous with the bladder trigone. Fiducial markers noted in the prostate as before. Other: No pneumoperitoneum, ascites or focal fluid collection. Solid subcutaneous 2.9 cm lateral right chest wall mass (series 2/image 29), new. Similar solid subcutaneous 4.4 cm left gluteal mass (series 2/image 63). Small fat containing umbilical hernia is stable. Musculoskeletal: Numerous new sclerotic osseous lesions throughout the visualized skeleton including multiple lower ribs bilaterally, multiple thoracolumbar vertebrae largest at T11 and throughout the bilateral pelvic girdle, most prominent in the left iliac crest (series 2/image 60). Marked lumbar spondylosis. IMPRESSION: 1. Numerous new solid pulmonary nodules and masses at both lung bases, largest 5.2 cm in the basilar right lower lobe, compatible with pulmonary metastases. 2. New widespread sclerotic osseous metastases throughout the visualized skeleton as detailed. 3. Multiple new hypodense liver masses compatible with liver metastases. 4. Newly mildly enlarged, irregular and poorly marginated prostate, directly contiguous with the bladder trigone, suggestive of recurrent locally advanced prostate cancer. 5. Moderate bilateral hydroureteronephrosis to the level of the UVJ bilaterally, probably due to bladder outlet obstruction despite the well-positioned Foley catheter in the bladder. 6. Multiple layering bladder stones in the distended bladder. 7. Cholelithiasis. 8. Stable left adrenal adenoma. 9. Aortic Atherosclerosis (ICD10-I70.0). Electronically Signed   By: Ilona Sorrel M.D.   On: 02/13/2020 10:06   US Abdomen Limited RUQ  Result Date: 02/17/2020 CLINICAL DATA:   74 year old with history of prostate cancer. Suspicious liver lesions on noncontrast CT. EXAM: ULTRASOUND ABDOMEN LIMITED RIGHT UPPER QUADRANT COMPARISON:  CT renal stone protocol 02/13/2020 FINDINGS: Gallbladder: Echogenic stone at the base of the gallbladder measuring up to 1.0 cm. Common bile duct: Diameter: 0.5 cm Liver: Liver parenchyma is mildly heterogeneous. There are scattered hypoechoic liver lesions that correspond with the previous CT findings. Round hypoechoic lesion in the right hepatic lobe measures up to 2.5 cm. Round hypoechoic lesion along the inferior margin of the right hepatic lobe measures up to 2.3 cm. Large hypoechoic lesion along the posterior aspect of the liver measures up to 4.2 cm. Portal vein is patent on color Doppler imaging with normal direction of blood flow towards the liver. Other: Partially visualized right kidney demonstrates dilatation of the right renal collecting system and similar to the recent CT. IMPRESSION: 1. Hypoechoic liver lesions. Findings are suggestive for metastatic disease. Patient is scheduled for ultrasound-guided core biopsy of a hepatic lesion. 2. Cholelithiasis. No evidence for gallbladder inflammation or biliary dilatation. Electronically Signed   By: Markus Daft M.D.   On: 02/17/2020 09:33    Assessment and plan-   #Castration resistant metastatic prostate cancer CT images are consistent with rapid progression of the metastatic prostate cancer. PSA was not much elevated, at 6. ultrasound-guided liver biopsy was done last week.  Results are pending. Per my discussion with pathology, prelim biopsy results showed metastatic adenocarcinoma, pending additional IHC to clarify the cell of origin.  Consult palliative care service.  Discussed with [employer].  #New onset of atrial fibrillation,Continue Eliquis #Acute on chronic CKD, Likely secondary to obstructive uropathy due to the malfunction of Foley catheter.  Appreciate urology  recommendation. Consider suprapubic tube percutaneous catheter if renal function and urinary retention continue to get worse despite conservative measures. #Acute respiratory failure, multifactorial, lung metastasis, possible volume overload, continue nasal cannula oxygen. #Anemia, hemoglobin 10.8, likely secondary to chronic hematuria.  Monitor hemoglobin closely.  Transfuse PRBC to keep hemoglobin above 7.  Continue oral iron supplementation.  Colace daily last bowel regimen #History of seizure, continue home dose of Keppra.  Seizure precautions.  Avoid tramadol  I called patient's daughter Denomme and was not able to reach her.  I called patient's wife and update her above.  Thank you for allowing me to participate in the care of this patient.   Earlie Server, MD, PhD Hematology Oncology [facility] Pager- [phone] 02/20/2020

## 2020-02-20 NOTE — Progress Notes (Signed)
catheter noted not to be draining, Catheter irrigated per MD orders, 1872ml of dark bloody urine with clots draining. MD made aware/will continue to assess.

## 2020-02-20 NOTE — Progress Notes (Signed)
Urology Inpatient Progress Note  Subjective: Creatinine rising today, 4.06.  H&H stable, 10.6 and 32.1%, respectively. Renal ultrasound yesterday revealed stable appearing bilateral hydronephrosis, L>R, with an appropriately placed Foley catheter and multiple bladder stones. WBC count up today, 18.2.  On antibiotics as below.  Repeat UA and culture ordered. Traumatic Foley removal 02/14/2020. Foley catheter exchanged overnight when it was found to be nondraining with bladder scan >638mL.  1300 mL urinary drainage immediately noted upon replacement. 38 French Foley catheter in place today draining thick, maroon urine.  Nursing first noted gross hematuria this a.m. upon shift change.  Repeat bladder scan this morning with 853 mL; they were able to clear 1800 mL of dark red urine with clots upon hand irrigation. He denies pain today and cannot state when he first noted return of gross hematuria. CT pelvis noncontrast reveals bladder distention with clot vs debris vs bladder stone layering around the tip; perivesicular inflammatory stranding consistent with acute cystitis; also with prostatic mass invading the bladder base with concern for bilateral UVJ obstruction.  Anti-infectives: Anti-infectives (From admission, onward)   Start     Dose/Rate Route Frequency Ordered Stop   02/19/20 2200  cephALEXin (KEFLEX) capsule 500 mg        500 mg Oral Every 8 hours 02/19/20 1607     02/18/20 0600  cephALEXin (KEFLEX) capsule 500 mg  Status:  Discontinued        500 mg Oral Every 6 hours 02/17/20 1252 02/19/20 1607   02/13/20 1000  ceFEPIme (MAXIPIME) 2 g in sodium chloride 0.9 % 100 mL IVPB  Status:  Discontinued        2 g 200 mL/hr over 30 Minutes Intravenous  Once 02/13/20 0942 02/13/20 0953   02/13/20 1000  cefTRIAXone (ROCEPHIN) 2 g in sodium chloride 0.9 % 100 mL IVPB  Status:  Discontinued        2 g 200 mL/hr over 30 Minutes Intravenous Every 24 hours 02/13/20 0954 02/17/20 1252      Current  Facility-Administered Medications  Medication Dose Route Frequency Provider Last Rate Last Admin  . acetaminophen (TYLENOL) tablet 650 mg  650 mg Oral Q6H PRN Ivor Costa, MD   650 mg at 02/20/20 0755  . acetaminophen-codeine (TYLENOL #3) 300-30 MG per tablet 1 tablet  1 tablet Oral Q4H PRN Earlie Server, MD   1 tablet at 02/19/20 2331  . amiodarone (PACERONE) tablet 400 mg  400 mg Oral BID Adaline Sill, NP   400 mg at 02/20/20 0855  . apixaban (ELIQUIS) tablet 10 mg  10 mg Oral BID Enzo Bi, MD   10 mg at 02/20/20 0856   Followed by  . [START ON 02/24/2020] apixaban (ELIQUIS) tablet 5 mg  5 mg Oral BID Enzo Bi, MD      . cephALEXin [facility]) capsule 500 mg  500 mg Oral Q8H Enzo Bi, MD   500 mg at 02/20/20 2355  . Chlorhexidine Gluconate Cloth 2 % PADS 6 each  6 each Topical Daily Ivor Costa, MD   6 each at 02/20/20 0857  . ferrous sulfate tablet 325 mg  325 mg Oral BID WC Earlie Server, MD   325 mg at 02/20/20 0755  . hydrALAZINE (APRESOLINE) injection 5 mg  5 mg Intravenous Q2H PRN Ivor Costa, MD      . levETIRAcetam (KEPPRA) tablet 500 mg  500 mg Oral BID Ivor Costa, MD   500 mg at 02/20/20 0856  . LORazepam (ATIVAN) injection 0.5 mg  0.5 mg Intravenous Q12H PRN Ivor Costa, MD   0.5 mg at 02/13/20 1431  . LORazepam (ATIVAN) injection 0.5-1 mg  0.5-1 mg Intravenous Q2H PRN Ivor Costa, MD      . metoprolol tartrate (LOPRESSOR) tablet 50 mg  50 mg Oral BID Ivor Costa, MD   50 mg at 02/20/20 0856  . rosuvastatin (CRESTOR) tablet 5 mg  5 mg Oral QHS Ivor Costa, MD   5 mg at 02/19/20 2321  . senna-docusate (Senokot-S) tablet 2 tablet  2 tablet Oral QHS Enzo Bi, MD   2 tablet at 02/18/20 2129  . simethicone (MYLICON) chewable tablet 80 mg  80 mg Oral QID PRN Enzo Bi, MD   80 mg at 02/18/20 1329  . sodium chloride flush (NS) 0.9 % injection 3 mL  3 mL Intravenous PRN Lorella Nimrod, MD   3 mL at 02/14/20 2314   Objective: Vital signs in last 24 hours: Temp:  [97.5 F (36.4 C)-98.6 F (37 C)] 98.6  F (37 C) (10/18 1143) Pulse Rate:  [63-78] 63 (10/18 1143) Resp:  [17-19] 18 (10/18 1143) BP: (106-138)/(66-80) 106/66 (10/18 1143) SpO2:  [89 %-95 %] 95 % (10/18 1143) Weight:  [118.3 kg] 118.3 kg (10/18 0409)  Intake/Output from previous day: 10/17 0701 - 10/18 0700 In: -  Out: 1550 [Urine:1550] Intake/Output this shift: Total I/O In: 240 [P.O.:240] Out: 1800 [Urine:1800]  Physical Exam Vitals and nursing note reviewed.  Constitutional:      General: He is not in acute distress.    Appearance: He is not ill-appearing, toxic-appearing or diaphoretic.  HENT:     Head: Normocephalic and atraumatic.  Pulmonary:     Effort: Pulmonary effort is normal. No respiratory distress.  Skin:    General: Skin is warm and dry.  Neurological:     Mental Status: He is alert.  Psychiatric:        Mood and Affect: Mood normal.        Behavior: Behavior normal.    Lab Results:  Recent Labs    02/19/20 0319 02/20/20 0257  WBC 15.0* 18.2*  HGB 10.1* 10.6*  HCT 30.4* 32.1*  PLT 275 304   BMET Recent Labs    02/19/20 0319 02/20/20 0257  NA 135 135  K 5.0 5.1  CL 104 102  CO2 16* 17*  GLUCOSE 138* 136*  BUN 60* 78*  CREATININE 3.45* 4.06*  CALCIUM 9.1 9.4   Studies/Results: CT PELVIS WO CONTRAST  Result Date: 02/20/2020 CLINICAL DATA:  Urinary retention, prostate cancer, urethral stricture EXAM: CT PELVIS WITHOUT CONTRAST TECHNIQUE: Multidetector CT imaging of the pelvis was performed following the standard protocol without intravenous contrast. COMPARISON:  02/13/20. FINDINGS: Urinary Tract: Foley catheter balloon is again seen within the bladder lumen. Despite this, the bladder appears mildly distended, raising the question of obstruction of the catheter. There are numerous layering calcifications within the bladder lumen, slightly improved from prior examination but persistent. Additionally, a small amount of calcific debris is seen within the Foley catheter lumen  suggesting partial obstruction secondary to debris. Gas is seen within the bladder lumen related to probable catheterization. There is mild perivesicular inflammatory stranding, similar to prior examination, likely infectious or inflammatory in nature. There is again noted mild right and moderate left hydroureter which appears similar to that seen on prior examination suggesting obstruction of the ureters bilaterally at the ureterovesicular junctions. Locally invasive prostatic mass with invasion of the bladder trigone is again noted. Brachytherapy seeds are seen within  the prostate gland. Bowel: Small broad-based umbilical hernia is again identified containing a single wall of the otherwise unremarkable mid transverse colon. The visualized large and small bowel are otherwise unremarkable. Appendix normal. No free fluid within the pelvis. Vascular/Lymphatic: Extensive aortoiliac atherosclerotic calcification. No aortic aneurysm. No pathologic adenopathy within the pelvis. Reproductive: Microlobulated prostatic mass invades the base of the bladder as described above. This is similar in appearance to prior examination. Other: Small fat containing left inguinal hernia noted. Rectum unremarkable. Musculoskeletal: Sclerotic metastases are again identified within the visualized axial skeleton in keeping with osseous metastatic disease. Similarly, 4.7 cm soft tissue mass is seen within the subcutaneous fat of the left gluteal region in keeping with a soft tissue metastasis. IMPRESSION: Stable examination. Prostatic mass invades the base of the bladder with resultant obstruction of the ureterovesicular junctions bilaterally and mild-to-moderate hydroureter noted bilaterally. Foley catheter balloon remains within the bladder lumen, however, mild bladder distension persists and calcific debris within the catheter lumen suggests partial obstruction due to debris. Layering bladder calculi again noted, slightly improved from  prior examination possibly related to fragmentation. Perivesicular inflammatory stranding suggesting persistent infectious or inflammatory cystitis. Sclerotic osseous and subcutaneous soft tissue metastatic disease again identified. Aortic Atherosclerosis (ICD10-I70.0). Electronically Signed   By: Fidela Salisbury MD   On: 02/20/2020 16:05   US RENAL  Result Date: 02/19/2020 CLINICAL DATA:  Acute renal injury EXAM: RENAL / URINARY TRACT ULTRASOUND COMPLETE COMPARISON:  CT from 02/13/2020 FINDINGS: Right Kidney: Renal measurements: 13.0 x 6.8 x 5.7 cm. = volume: 266 mL. Mild to moderate hydronephrosis is noted. Left Kidney: Renal measurements: 14.8 x 7.1 x 6.1 cm. = volume: 333 mL. Moderate to severe hydronephrosis is noted on the left. Additionally there is a 3.9 cm cystic lesion in the lower pole which appears simple in nature. Bladder: Decompressed by Foley catheter. Multiple bladder calculi are again identified and stable. Other: None. IMPRESSION: Stable appearing hydronephrosis bilaterally worse on the left than the right this is stable from prior CT examination. Left renal cyst. Multiple bladder calculi. Electronically Signed   By: Inez Catalina M.D.   On: 02/19/2020 20:12   Assessment & Plan: 74 year old male with metastatic castrate resistant prostate cancer with concern for possible corporal involvement s/p cystoscopy with urethral dilation with Dr. [name] on 02/07/2020 who was subsequently admitted for management of gross hematuria and found to have bilateral PEs, now on Eliquis with concern for hypercoagulable state secondary to malignancy.  Foley catheter exchange overnight due to catheter occlusion, now in clot retention on physical exam.  Unclear when gross hematuria returned, however traumatic Foley removal and anticoagulation are likely contributory.   I irrigated the patient's catheter at the bedside today.  I flushed 1500 mL of sterile water through the 18 French Foley catheter in place  with clearance of approximately 1800 mL of bloody efflux.  Urine cleared from cola colored to cherry red with irrigation and I was able to clear approximately 75 cc of clot material in total.  Catheter was noted to be draining incompletely at the completion of the procedure.    I returned to the bedside with Dr. Erlene Quan this evening; catheter found to be nondraining at that time. Foley irrigated with 60cc sterile water with return of ~400cc dark red urine. Foley draining well upon completion.  There remains retained clot vs debris vs bladder stones vs tumor in the bladder base, which is posing a challenge for keeping Foley draining.   Ultimately recommend bilateral PCNs for  management of likely bilateral malignant UO obstruction, however he would need to be off Eliquis at least 48 hours prior. Recommend starting heparin gtt with plans for PCNs in 2 days at the soonest pending further goals of care discuss with Palliative Care.  Recommendations: -Palliative care consult -Irrigate Foley with 60ccs of sterile saline every 4 hours -Stop Eliquis, start heparin gtt with plans for bilateral PCN placement 10/20 at the [place], PA-C 02/20/2020

## 2020-02-20 NOTE — Progress Notes (Signed)
SUBJECTIVE: Patient resting in bed.  Denies chest pain or shortness of breath but continues to be on supplemental oxygen.  Patient states his he is feeling better after a Foley exchanged yesterday.   Vitals:   02/19/20 1608 02/19/20 1929 02/20/20 0409 02/20/20 0722  BP: 129/78 128/79 131/80 138/80  Pulse: 75 78 73 71  Resp: 18 17 19 18   Temp: 97.9 F (36.6 C) 97.6 F (36.4 C) 97.8 F (36.6 C) (!) 97.5 F (36.4 C)  TempSrc:  Oral Oral Oral  SpO2: (!) 89% 95% 94% 91%  Weight:   118.3 kg   Height:        Intake/Output Summary (Last 24 hours) at 02/20/2020 0951 Last data filed at 02/20/2020 0913 Gross per 24 hour  Intake --  Output 3350 ml  Net -3350 ml    LABS: Basic Metabolic Panel: Recent Labs    02/19/20 0319 02/20/20 0257  NA 135 135  K 5.0 5.1  CL 104 102  CO2 16* 17*  GLUCOSE 138* 136*  BUN 60* 78*  CREATININE 3.45* 4.06*  CALCIUM 9.1 9.4  MG 2.6* 2.7*   Liver Function Tests: No results for input(s): AST, ALT, ALKPHOS, BILITOT, PROT, ALBUMIN in the last 72 hours. No results for input(s): LIPASE, AMYLASE in the last 72 hours. CBC: Recent Labs    02/19/20 0319 02/20/20 0257  WBC 15.0* 18.2*  HGB 10.1* 10.6*  HCT 30.4* 32.1*  MCV 88.9 90.9  PLT 275 304   Cardiac Enzymes: No results for input(s): CKTOTAL, CKMB, CKMBINDEX, TROPONINI in the last 72 hours. BNP: Invalid input(s): POCBNP D-Dimer: No results for input(s): DDIMER in the last 72 hours. Hemoglobin A1C: No results for input(s): HGBA1C in the last 72 hours. Fasting Lipid Panel: No results for input(s): CHOL, HDL, LDLCALC, TRIG, CHOLHDL, LDLDIRECT in the last 72 hours. Thyroid Function Tests: No results for input(s): TSH, T4TOTAL, T3FREE, THYROIDAB in the last 72 hours.  Invalid input(s): FREET3 Anemia Panel: No results for input(s): VITAMINB12, FOLATE, FERRITIN, TIBC, IRON, RETICCTPCT in the last 72 hours.   PHYSICAL EXAM General: Well developed, well nourished, in no acute  distress HEENT:  Normocephalic and atramatic Neck:  No JVD.  Lungs: Clear bilaterally to auscultation and percussion. Heart: HRRR . Normal S1 and S2 without gallops or murmurs.  Abdomen: Bowel sounds are positive, abdomen soft and non-tender  Msk:  Back normal, normal gait. Normal strength and tone for age. Extremities: No clubbing, cyanosis or edema.   Neuro: Alert and oriented X 3. Psych:  Good affect, responds appropriately  TELEMETRY: NSR. 72/bpm.  ASSESSMENT AND PLAN: Patient presenting to the emergency room with hematuria and lightheadedness found to have a UTI and pulmonary embolus who then developed atrial fibrillation with rapid ventricular rate.Patient remains in NSR. Continue Amiodarone 400mg  BID, metoprolol tartrate 50mg  bid, and eliquis. Patient with a Foley exchange yesterday but continues to retain urine, worsening AKI, and renal ultrasound revealed bilateral hydronephrosis. Will continue to follow    Principal Problem:   Urinary tract infection associated with indwelling urethral catheter ([place]) Active Problems:   Stroke ([place])   Seizures ([place])   Prostate cancer (H[facility])   Hypotension   Hypertension   Type II diabetes mellitus with renal manifestations (HCC)   AKI (acute kidney injury) ([place])   Chronic diastolic CHF (congestive heart failure) (H[facility])   Atrial fibrillation with RVR ([place])   PE (pulmonary thromboembolism) ([place])   Liver mass    Adaline Sill, NP-C 02/20/2020 9:51 AM

## 2020-02-20 NOTE — Progress Notes (Signed)
PROGRESS NOTE    Timothy Maddox  JGG:[id number] DOB: 23-Dec-1945 DOA: 02/13/2020 PCP: [name], MD   Brief Narrative: Taken from H&P Timothy Maddox is a 74 y.o. 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  Pt has a h/o prostate CA, urethral stricture. He is s/p of recent cystoscopy and foley placement by Dr. [name] on 02/07/20. On arrival he was hypotensive which improved with IV fluids, UA looks infected, COVID-19 negative, worsening renal function.  CT renal stone study was concerning for multiple mets involving lung, osseous mets and liver.  Moderate bilateral hydronephrosis to the level of UVJ bilaterally and multiple layering bladder stones surrounding the Foley catheter tip. CTA was obtained due to elevated D-dimer and came back positive for PE, without any CT evidence of right heart strain, patient was started on IV heparin. Developed new onset A. fib with RVR-started on Cardizem infusion, overnight converted back to sinus rhythm and Cardizem infusion was discontinued today.  Cardiology was also consulted.  Subjective: New Foley became occluded again today.  After irrigation, 1888ml of dark bloody urine with clots drained.  Cr worsened.  Pt reported increased dyspnea.  Liver biopsy revealed metastatic adenocarcinoma.  Palliative care consulted.   Assessment & Plan:   Principal Problem:   Urinary tract infection associated with indwelling urethral catheter (HCC) Active Problems:   Stroke ([place])   Seizures ([place])   Prostate cancer (HCC)   Hypotension   Hypertension   Type II diabetes mellitus with renal manifestations (HCC)   AKI (acute kidney injury) ([place])   Chronic diastolic CHF (congestive heart failure) (HCC)   Atrial fibrillation with RVR (HCC)   PE (pulmonary thromboembolism) (HCC)   Liver mass   Palliative care  encounter   Acute hypoxic respiratory failure --While working with PT to ambulate, O2 sat noted to drop to 84% on RA. --CXR 10/16 no acute finding except for low lung volumes and known mets. --Trial Lasix 40 mg x1  PLAN: --CXR today again showed no acute process --cont suppl O2 and wean as able  Acute urinary retention while on Foley --Foley clogged, new Foley placed on 10/17 with return of 1300 ml urine. --Today, Foley clogged again.  After irrigation, 186ml of dark bloody urine with clots drained. PLAN: --Urology eval today, can not upsize Foley due to stricture --order Foley irrigation q4h --May need bilateral perc tubes  Leukocytosis --WBC has been trending up.  No fever. --CXR today, no acute finding. --obtain repeat UA and urine cx  Complicated UTI/hydronephrosis/multiple bladder stones.  Recent cystoscopy.  Blood cultures negative so far.  Urinary cultures pos for Staphylococcus epidermidis, questionable significance.   --completed a course of ceftriaxone 5 days + Keflex 2 days.  # Penile pain and likely Urethral injury After pulling on Foley catheter.  Pain improved.  Prostate cancer with mets Patient was waiting for PET scan for concern of recurrence of bladder cancer.  CT renal stone study and CTA are concerning for multiple mets including lungs, liver and bones. --Oncology consulted, Dr. 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 PLAN: -continue Nubeqa. --Palliative consult today  New onset A. fib with RVR.   Most likely secondary to PE.  Cardiology was consulted and he was started on Cardizem infusion.  --initiated amiodarone oral loading dose per cardiology.  --HR went up to 110-140's Afib evening  10/13, so started amiodarone gtt, per cardiology rec.  Amiodarone gtt was stopped early this morning because night-team felt pt's HR was already down to 60's.   --another episode of Afib RVR night of 10/15 PLAN: --cont amiodarone  oral 400 mg BID --cont metop 50 mg BID --Hold Eliquis and start heparin gtt for possible perc tube placement  Acute Pulmonary embolism.   CTA was done due to elevated D-dimer above 4000.  It was positive for PE.  Patient is high risk secondary to extensive malignancy.  Started on heparin infusion and then transitioned to Eliquis PLAN: --Hold Eliquis and start heparin gtt for possible perc tube placement  Anemia, iron def Hemoglobin dropped to 10.8 from 12.3.  Most likely secondary to hematuria.  Anemia panel consistent with mild iron deficiency along with anemia of chronic disease.   PLAN: --cont iron suppl  History of CVA.   No acute concern. -Continue Lipitor  History of seizure disorder. -Continue home dose of Keppra. -Continue with seizure precautions -As needed Ativan  Hypertension.   Blood pressure within goal today. -Continue metoprolol. -Holding home dose of spironolactone and Hyzaar. -Continue IV hydralazine as needed  AKI, worsened  CKD stage IIIa.   Bilateral hydronephrosis, POA Baseline creatinine around 1.2-1.6.  Stable today around 1.73.  Patient appears dehydrated and hypotensive on admission, s/p MIVF --Cr worsened to 3.45 and then 4.06 this morning, pt found to have urinary retention while on Foley --renal US showed persistent bilateral hydronephrosis PLAN: --Ensure Foley drainage -Continue to hold Cozaar and spironolactone. --May need bilateral perc tubes  Chronic diastolic heart failure.  Prior echo done in 2017 with normal EF and grade 1 diastolic dysfunction.  Patient appears euvolemic. PLAN: -Holding home dose of spironolactone. -Continue metoprolol  Type II diabetes mellitus with renal manifestations [facility]): Recent A1c 6.3, well controlled.  Patient taking Metformin at home. --BG has been within goal for inpatient, and pt hasn't required more than 1 or 2 units of insulin per day --no need for fingersticks  Abdominal distention --KUB today showed  increased bowel gas, but no stool burden, nor bowel obstruction --Miralax and Senna to stimulate bowel movement --simethicone PRN   Objective: Vitals:   02/20/20 0409 02/20/20 0722 02/20/20 1143 02/20/20 1458  BP: 131/80 138/80 106/66 120/70  Pulse: 73 71 63 66  Resp: 19 18 18 19   Temp: 97.8 F (36.6 C) (!) 97.5 F (36.4 C) 98.6 F (37 C) 97.9 F (36.6 C)  TempSrc: Oral Oral Oral   SpO2: 94% 91% 95% 92%  Weight: 118.3 kg     Height:        Intake/Output Summary (Last 24 hours) at 02/20/2020 1710 Last data filed at 02/20/2020 1023 Gross per 24 hour  Intake 240 ml  Output 3350 ml  Net -3110 ml   Filed Weights   02/18/20 0357 02/19/20 0526 02/20/20 0409  Weight: 116.4 kg 120.4 kg 118.3 kg    Examination: Constitutional: NAD, AAOx3 HEENT: conjunctivae and lids normal, EOMI CV: No cyanosis.   RESP: shallow breaths, on 2-3L Extremities: No effusions, edema in BLE SKIN: warm, dry and intact Neuro: II - XII grossly intact.   Psych: depressed mood and affect.    Foley cath present, bloody urine   DVT prophylaxis: eliquis Code Status: Full Family Communication: daughter and wife updated at bedside today Disposition Plan:  Status is: Inpatient  The patient is from: home Anticipated d/c is to: undetermined Anticipated d/c date is: undetermined Patient currently is not medically stable  to d/c due to: developed acute hypoxic respiratory failure today, O2 dropped with ambulation.  Also with worsening AKI with urinary retention while on Foley, bilateral hydro may need perc tube.    Consultants:   Urology  Cardiology  Procedures:  Antimicrobials:  Ceftriaxone  Data Reviewed: I have personally reviewed following labs and imaging studies  CBC: Recent Labs  Lab 02/16/20 0332 02/17/20 0350 02/18/20 0435 02/19/20 0319 02/20/20 0257  WBC 9.8 10.3 11.4* 15.0* 18.2*  HGB 10.3* 10.0* 9.4* 10.1* 10.6*  HCT 31.0* 29.2* 28.6* 30.4* 32.1*  MCV 88.6 88.5 89.9 88.9  90.9  PLT 269 292 230 275 527   Basic Metabolic Panel: Recent Labs  Lab 02/16/20 0332 02/16/20 0332 02/17/20 0350 02/17/20 2333 02/18/20 0435 02/19/20 0319 02/20/20 0257  NA 137  --  137  --  133* 135 135  K 3.9   < > 4.0 4.0 4.0 5.0 5.1  CL 105  --  106  --  103 104 102  CO2 20*  --  20*  --  19* 16* 17*  GLUCOSE 128*  --  140*  --  158* 138* 136*  BUN 42*  --  40*  --  39* 60* 78*  CREATININE 1.72*  --  1.93*  --  1.72* 3.45* 4.06*  CALCIUM 8.9  --  9.1  --  9.0 9.1 9.4  MG 2.4   < > 2.2 1.9 2.5* 2.6* 2.7*   < > = values in this interval not displayed.   GFR: Estimated Creatinine Clearance: 21.8 mL/min (A) (by C-G formula based on SCr of 4.06 mg/dL (H)). Liver Function Tests: No results for input(s): AST, ALT, ALKPHOS, BILITOT, PROT, ALBUMIN in the last 168 hours. No results for input(s): LIPASE, AMYLASE in the last 168 hours. No results for input(s): AMMONIA in the last 168 hours. Coagulation Profile: Recent Labs  Lab 02/17/20 0350  INR 1.2   Cardiac Enzymes: No results for input(s): CKTOTAL, CKMB, CKMBINDEX, TROPONINI in the last 168 hours. BNP (last 3 results) No results for input(s): PROBNP in the last 8760 hours. HbA1C: No results for input(s): HGBA1C in the last 72 hours. CBG: Recent Labs  Lab 02/14/20 2038 02/15/20 0803 02/15/20 1139 02/15/20 1648 02/15/20 2053  GLUCAP 137* 119* 145* 130* 163*   Lipid Profile: No results for input(s): CHOL, HDL, LDLCALC, TRIG, CHOLHDL, LDLDIRECT in the last 72 hours. Thyroid Function Tests: No results for input(s): TSH, T4TOTAL, FREET4, T3FREE, THYROIDAB in the last 72 hours. Anemia Panel: No results for input(s): VITAMINB12, FOLATE, FERRITIN, TIBC, IRON, RETICCTPCT in the last 72 hours. Sepsis Labs: Recent Labs  Lab 02/19/20 0319  PROCALCITON 0.75    Recent Results (from the past 240 hour(s))  Urine culture     Status: Abnormal   Collection Time: 02/13/20  8:46 AM   Specimen: Urine, Catheterized  Result  Value Ref Range Status   Specimen Description   Final    URINE, CATHETERIZED Performed at [facility], [address], [place], [place] [address]    Special Requests   Final    NONE Performed at [facility], [place],  [address]    Culture 50,000 COLONIES/mL STAPHYLOCOCCUS EPIDERMIDIS (A)  Final   Report Status 02/15/2020 FINAL  Final   Organism ID, Bacteria STAPHYLOCOCCUS EPIDERMIDIS (A)  Final      Susceptibility   Staphylococcus epidermidis - MIC*    CIPROFLOXACIN >=8 RESISTANT Resistant     GENTAMICIN <=0.5 SENSITIVE Sensitive  NITROFURANTOIN <=16 SENSITIVE Sensitive     OXACILLIN <=0.25 SENSITIVE Sensitive     TETRACYCLINE 2 SENSITIVE Sensitive     VANCOMYCIN 1 SENSITIVE Sensitive     TRIMETH/SULFA >=320 RESISTANT Resistant     CLINDAMYCIN >=8 RESISTANT Resistant     RIFAMPIN <=0.5 SENSITIVE Sensitive     Inducible Clindamycin NEGATIVE Sensitive     * 50,000 COLONIES/mL STAPHYLOCOCCUS EPIDERMIDIS  Resp Panel by RT PCR (RSV, Flu A&B, Covid) - Nasopharyngeal Swab     Status: None   Collection Time: 02/13/20  9:59 AM   Specimen: Nasopharyngeal Swab  Result Value Ref Range Status   SARS Coronavirus 2 by RT PCR NEGATIVE NEGATIVE Final    Comment: (NOTE) SARS-CoV-2 target nucleic acids are NOT DETECTED.  The SARS-CoV-2 RNA is generally detectable in upper respiratoy specimens during the acute phase of infection. The lowest concentration of SARS-CoV-2 viral copies this assay can detect is 131 copies/mL. A negative result does not preclude SARS-Cov-2 infection and should not be used as the sole basis for treatment or other patient management decisions. A negative result may occur with  improper specimen collection/handling, submission of specimen other than nasopharyngeal swab, presence of viral mutation(s) within the areas targeted by this assay, and inadequate number of viral copies (<131 copies/mL). A negative result must be  combined with clinical observations, patient history, and epidemiological information. The expected result is Negative.  Fact Sheet for Patients:  PinkCheek.be  Fact Sheet for Healthcare Providers:  GravelBags.it  This test is no t yet approved or cleared by the [place] FDA and  has been authorized for detection and/or diagnosis of SARS-CoV-2 by FDA under an Emergency Use Authorization (EUA). This EUA will remain  in effect (meaning this test can be used) for the duration of the COVID-19 declaration under Section 564(b)(1) of the Act, 21 U.S.C. section 360bbb-3(b)(1), unless the authorization is terminated or revoked sooner.     Influenza A by PCR NEGATIVE NEGATIVE Final   Influenza B by PCR NEGATIVE NEGATIVE Final    Comment: (NOTE) The Xpert Xpress SARS-CoV-2/FLU/RSV assay is intended as an aid in  the diagnosis of influenza from Nasopharyngeal swab specimens and  should not be used as a sole basis for treatment. Nasal washings and  aspirates are unacceptable for Xpert Xpress SARS-CoV-2/FLU/RSV  testing.  Fact Sheet for Patients: PinkCheek.be  Fact Sheet for Healthcare Providers: GravelBags.it  This test is not yet approved or cleared by the [place] FDA and  has been authorized for detection and/or diagnosis of SARS-CoV-2 by  FDA under an Emergency Use Authorization (EUA). This EUA will remain  in effect (meaning this test can be used) for the duration of the  Covid-19 declaration under Section 564(b)(1) of the Act, 21  U.S.C. section 360bbb-3(b)(1), unless the authorization is  terminated or revoked.    Respiratory Syncytial Virus by PCR NEGATIVE NEGATIVE Final    Comment: (NOTE) Fact Sheet for Patients: PinkCheek.be  Fact Sheet for Healthcare Providers: GravelBags.it  This test is  not yet approved or cleared by the [place] FDA and  has been authorized for detection and/or diagnosis of SARS-CoV-2 by  FDA under an Emergency Use Authorization (EUA). This EUA will remain  in effect (meaning this test can be used) for the duration of the  COVID-19 declaration under Section 564(b)(1) of the Act, 21 U.S.C.  section 360bbb-3(b)(1), unless the authorization is terminated or  revoked. Performed at [facility], [address], [place], [place] [address]  Culture, blood (Routine X 2) w Reflex to ID Panel     Status: None   Collection Time: 02/13/20 10:22 AM   Specimen: BLOOD  Result Value Ref Range Status   Specimen Description BLOOD LEFT HAND  Final   Special Requests   Final    BOTTLES DRAWN AEROBIC AND ANAEROBIC Blood Culture results may not be optimal due to an inadequate volume of blood received in culture bottles   Culture   Final    NO GROWTH 5 DAYS Performed at [facility], [address], [place], [place] [address]    Report Status 02/18/2020 FINAL  Final  Culture, blood (Routine X 2) w Reflex to ID Panel     Status: None   Collection Time: 02/13/20 10:30 AM   Specimen: BLOOD  Result Value Ref Range Status   Specimen Description BLOOD LEFT ARM  Final   Special Requests   Final    BOTTLES DRAWN AEROBIC AND ANAEROBIC Blood Culture adequate volume   Culture   Final    NO GROWTH 5 DAYS Performed at [facility], [address], [place], [place] [address]    Report Status 02/18/2020 FINAL  Final     Radiology Studies: CT PELVIS WO CONTRAST  Result Date: 02/20/2020 CLINICAL DATA:  Urinary retention, prostate cancer, urethral stricture EXAM: CT PELVIS WITHOUT CONTRAST TECHNIQUE: Multidetector CT imaging of the pelvis was performed following the standard protocol without intravenous contrast. COMPARISON:  02/13/20. FINDINGS: Urinary Tract: Foley catheter balloon is again seen within the bladder lumen. Despite this, the  bladder appears mildly distended, raising the question of obstruction of the catheter. There are numerous layering calcifications within the bladder lumen, slightly improved from prior examination but persistent. Additionally, a small amount of calcific debris is seen within the Foley catheter lumen suggesting partial obstruction secondary to debris. Gas is seen within the bladder lumen related to probable catheterization. There is mild perivesicular inflammatory stranding, similar to prior examination, likely infectious or inflammatory in nature. There is again noted mild right and moderate left hydroureter which appears similar to that seen on prior examination suggesting obstruction of the ureters bilaterally at the ureterovesicular junctions. Locally invasive prostatic mass with invasion of the bladder trigone is again noted. Brachytherapy seeds are seen within the prostate gland. Bowel: Small broad-based umbilical hernia is again identified containing a single wall of the otherwise unremarkable mid transverse colon. The visualized large and small bowel are otherwise unremarkable. Appendix normal. No free fluid within the pelvis. Vascular/Lymphatic: Extensive aortoiliac atherosclerotic calcification. No aortic aneurysm. No pathologic adenopathy within the pelvis. Reproductive: Microlobulated prostatic mass invades the base of the bladder as described above. This is similar in appearance to prior examination. Other: Small fat containing left inguinal hernia noted. Rectum unremarkable. Musculoskeletal: Sclerotic metastases are again identified within the visualized axial skeleton in keeping with osseous metastatic disease. Similarly, 4.7 cm soft tissue mass is seen within the subcutaneous fat of the left gluteal region in keeping with a soft tissue metastasis. IMPRESSION: Stable examination. Prostatic mass invades the base of the bladder with resultant obstruction of the ureterovesicular junctions bilaterally and  mild-to-moderate hydroureter noted bilaterally. Foley catheter balloon remains within the bladder lumen, however, mild bladder distension persists and calcific debris within the catheter lumen suggests partial obstruction due to debris. Layering bladder calculi again noted, slightly improved from prior examination possibly related to fragmentation. Perivesicular inflammatory stranding suggesting persistent infectious or inflammatory cystitis. Sclerotic osseous and subcutaneous soft tissue metastatic disease again identified.  Aortic Atherosclerosis (ICD10-I70.0). Electronically Signed   By: Fidela Salisbury MD   On: 02/20/2020 16:05   US RENAL  Result Date: 02/19/2020 CLINICAL DATA:  Acute renal injury EXAM: RENAL / URINARY TRACT ULTRASOUND COMPLETE COMPARISON:  CT from 02/13/2020 FINDINGS: Right Kidney: Renal measurements: 13.0 x 6.8 x 5.7 cm. = volume: 266 mL. Mild to moderate hydronephrosis is noted. Left Kidney: Renal measurements: 14.8 x 7.1 x 6.1 cm. = volume: 333 mL. Moderate to severe hydronephrosis is noted on the left. Additionally there is a 3.9 cm cystic lesion in the lower pole which appears simple in nature. Bladder: Decompressed by Foley catheter. Multiple bladder calculi are again identified and stable. Other: None. IMPRESSION: Stable appearing hydronephrosis bilaterally worse on the left than the right this is stable from prior CT examination. Left renal cyst. Multiple bladder calculi. Electronically Signed   By: Inez Catalina M.D.   On: 02/19/2020 20:12    Scheduled Meds: . amiodarone  400 mg Oral BID  . apixaban  10 mg Oral BID   Followed by  . [START ON 02/24/2020] apixaban  5 mg Oral BID  . Chlorhexidine Gluconate Cloth  6 each Topical Daily  . ferrous sulfate  325 mg Oral BID WC  . levETIRAcetam  500 mg Oral BID  . metoprolol tartrate  50 mg Oral BID  . rosuvastatin  5 mg Oral QHS  . senna-docusate  2 tablet Oral QHS   Continuous Infusions:    LOS: 7 days    Enzo Bi,  MD [facility]  If 7PM-7AM, please contact night-coverage [email or web address]  02/20/2020, 5:10 PM

## 2020-02-20 NOTE — Progress Notes (Signed)
   02/20/20 2213  Assess: MEWS Score  BP 137/78  Pulse Rate 84  ECG Heart Rate 84  Resp (!) 26  Level of Consciousness Alert  SpO2 90 %  Assess: MEWS Score  MEWS Temp 0  MEWS Systolic 0  MEWS Pulse 0  MEWS RR 2  MEWS LOC 0  MEWS Score 2  MEWS Score Color Yellow  Assess: if the MEWS score is Yellow or Red  Were vital signs taken at a resting state? Yes  Focused Assessment Change from prior assessment (see assessment flowsheet)  Early Detection of Sepsis Score *See Row Information* Low  MEWS guidelines implemented *See Row Information* Yes  Treat  MEWS Interventions Administered scheduled meds/treatments;Other (Comment) (increased O2 to 4 liters/min)  Pain Scale 0-10  Pain Score 0  Take Vital Signs  Increase Vital Sign Frequency  Yellow: Q 2hr X 2 then Q 4hr X 2, if remains yellow, continue Q 4hrs  Escalate  MEWS: Escalate Yellow: discuss with charge nurse/RN and consider discussing with provider and RRT  Notify: Charge Nurse/RN  Name of Charge Nurse/RN Notified Alisa RN  Date Charge Nurse/RN Notified 02/20/20  Time Charge Nurse/RN Notified 2230   Patient noted to have increased tachypnea, 26-32/min. Sats decreased to 88-89 esp with talking or activity. O2 increased to 4L/M, with improvement in sats to 92%. Will CTM. Heparin infusion started as ordered.

## 2020-02-20 NOTE — Progress Notes (Signed)
Pt bladder scanned, resulting greater than 814mL of urine. Chronic foley in place. MD made aware.

## 2020-02-20 NOTE — Progress Notes (Signed)
Foley irrigated with 57ml sterile water, 260ml output emptied and still draining.

## 2020-02-20 NOTE — Progress Notes (Signed)
PT Cancellation Note  Patient Details Name: LORRIE STRAUCH MRN: [id number] DOB: 1945-12-06   Cancelled Treatment:    Reason Eval/Treat Not Completed: Other (comment) (Patient declined PT due to fatigue and issues with catheter despite encouragement. PT will continue with attempts as appropriate.   Minna Merritts, PT, MPT  Percell Locus 02/20/2020, 2:34 PM

## 2020-02-20 NOTE — Consult Note (Signed)
[place]  [phone] [phone]   Name: Timothy Maddox Date: 02/20/2020 MRN: [id number]  DOB: 07/22/1945  Patient Care Team: [name], MD as PCP - General (Internal Medicine) Abbie Sons, MD (Urology)    REASON FOR CONSULTATION: Timothy Maddox is a 74 y.o. 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  CTA of the chest on 02/13/2020 revealed acute PE and innumerable bilateral pulmonary masses with bilateral pathologic hilar adenopathy and widespread sclerotic osseous metastases.  Additionally patient had a CT renal study on 02/13/2020 with findings of multiple liver masses and a newly enlarged poorly marginated prostate mass.  Patient's hospitalization has been complicated by A. fib, respiratory failure, and obstructive uropathy despite Foley exchanges.  Palliative care was consulted to help address goals.  SOCIAL HISTORY:     reports that he quit smoking about 53 years ago. His smoking use included cigarettes. He has a 2.00 pack-year smoking history. He has never used smokeless tobacco. He reports previous alcohol use. He reports that he does not use drugs.  Patient is married and lives at home with his wife.  He has a daughter and son who are involved.  Patient retired as a Public relations account executive.  ADVANCE DIRECTIVES:  Not on file  CODE STATUS: Full code  PAST MEDICAL HISTORY: Past Medical History:  Diagnosis Date  . Anxiety   . Arthritis    lower back, shoulders  . AVM (arteriovenous malformation) brain   . Cancer [facility])    prostate- treated with radiation  . CVA (cerebral infarction) 11/2015    . Depression   . Diabetes mellitus without complication ([place])   . Dyspnea    with exertion  . HOH (hard of hearing)   . Hyperlipidemia   . Hypertension   . Pneumonia    1st grade  . Pre-diabetes   . Prediabetes   . Seizures ([place]) 12/2015  . Sleep apnea   . Stroke [facility])    speech impairment, no weakness    PAST SURGICAL HISTORY:  Past Surgical History:  Procedure Laterality Date  . BACK SURGERY  1999   disectomy  . BACK SURGERY     2 surgery- injury  . COLONOSCOPY W/ POLYPECTOMY    . COLONOSCOPY WITH PROPOFOL N/A 02/28/2019   Procedure: COLONOSCOPY WITH PROPOFOL;  Surgeon: Toledo, Benay Pike, MD;  Location: [facility] ENDOSCOPY;  Service: Gastroenterology;  Laterality: N/A;  . CYSTOSCOPY WITH URETHRAL DILATATION Bilateral 02/07/2020   Procedure: CYSTOSCOPY WITH URETHRAL DILATATION;  Surgeon: Abbie Sons, MD;  Location: [facility] ORS;  Service: Urology;  Laterality: Bilateral;  . HAMMER TOE SURGERY Bilateral   . INGUINAL HERNIA REPAIR Right    per patient when he was in 1st grade  . IR GENERIC HISTORICAL  12/05/2015   IR RADIOLOGIST EVAL & MGMT 12/05/2015 [facility]  . IR GENERIC HISTORICAL  01/31/2016   IR RADIOLOGIST EVAL & MGMT 01/31/2016 [facility]  . IR GENERIC HISTORICAL  02/20/2016   IR ANGIO INTRA EXTRACRAN SEL COM CAROTID INNOMINATE UNI L MOD SED 02/20/2016 [name], MD [facility]  . IR GENERIC HISTORICAL  02/20/2016   IR 3D INDEPENDENT WKST 02/20/2016 [name], MD [facility]  .  KNEE SURGERY Bilateral 2015  . RADIOLOGY WITH ANESTHESIA N/A 02/20/2016   Procedure: EMBOLIZATION;  Surgeon: [name], MD;  Location: [place];  Service: Radiology;  Laterality: N/A;  . TOE SURGERY Left 2017   TOE NAIL REMOVAL  . TOTAL KNEE ARTHROPLASTY Right 07/20/2017   Procedure: TOTAL KNEE ARTHROPLASTY;  Surgeon: Lovell Sheehan, MD;  Location: [facility] ORS;  Service: Orthopedics;  Laterality: Right;    HEMATOLOGY/ONCOLOGY HISTORY:  Oncology History   No history  exists.    ALLERGIES:  is allergic to poison oak extract and poison ivy extract.  MEDICATIONS:  Current Facility-Administered Medications  Medication Dose Route Frequency Provider Last Rate Last Admin  . acetaminophen (TYLENOL) tablet 650 mg  650 mg Oral Q6H PRN Ivor Costa, MD   650 mg at 02/20/20 0755  . acetaminophen-codeine (TYLENOL #3) 300-30 MG per tablet 1 tablet  1 tablet Oral Q4H PRN Earlie Server, MD   1 tablet at 02/19/20 2331  . amiodarone (PACERONE) tablet 400 mg  400 mg Oral BID Adaline Sill, NP   400 mg at 02/20/20 0855  . apixaban (ELIQUIS) tablet 10 mg  10 mg Oral BID Enzo Bi, MD   10 mg at 02/20/20 0856   Followed by  . [START ON 02/24/2020] apixaban (ELIQUIS) tablet 5 mg  5 mg Oral BID Enzo Bi, MD      . cephALEXin [facility]) capsule 500 mg  500 mg Oral Q8H Enzo Bi, MD   500 mg at 02/20/20 8676  . Chlorhexidine Gluconate Cloth 2 % PADS 6 each  6 each Topical Daily Ivor Costa, MD   6 each at 02/20/20 0857  . ferrous sulfate tablet 325 mg  325 mg Oral BID WC Earlie Server, MD   325 mg at 02/20/20 0755  . hydrALAZINE (APRESOLINE) injection 5 mg  5 mg Intravenous Q2H PRN Ivor Costa, MD      . levETIRAcetam (KEPPRA) tablet 500 mg  500 mg Oral BID Ivor Costa, MD   500 mg at 02/20/20 0856  . LORazepam (ATIVAN) injection 0.5 mg  0.5 mg Intravenous Q12H PRN Ivor Costa, MD   0.5 mg at 02/13/20 1431  . LORazepam (ATIVAN) injection 0.5-1 mg  0.5-1 mg Intravenous Q2H PRN Ivor Costa, MD      . metoprolol tartrate (LOPRESSOR) tablet 50 mg  50 mg Oral BID Ivor Costa, MD   50 mg at 02/20/20 0856  . rosuvastatin (CRESTOR) tablet 5 mg  5 mg Oral QHS Ivor Costa, MD   5 mg at 02/19/20 2321  . senna-docusate (Senokot-S) tablet 2 tablet  2 tablet Oral QHS Enzo Bi, MD   2 tablet at 02/18/20 2129  . simethicone (MYLICON) chewable tablet 80 mg  80 mg Oral QID PRN Enzo Bi, MD   80 mg at 02/18/20 1329  . sodium chloride flush (NS) 0.9 % injection 3 mL  3 mL Intravenous PRN Lorella Nimrod, MD   3 mL  at 02/14/20 2314    VITAL SIGNS: BP 120/70 (BP Location: Left Arm)   Pulse 66   Temp 97.9 F (36.6 C)   Resp 19   Ht 6\' 1"  (1.854 m)   Wt 260 lb 11.2 oz (118.3 kg)   SpO2 92%   BMI 34.40 kg/m  Filed Weights   02/18/20 0357 02/19/20 0526 02/20/20 0409  Weight: 256 lb 9.6 oz (116.4 kg) 265 lb 8 oz (120.4 kg) 260 lb 11.2 oz (118.3 kg)    Estimated body mass index is 34.4  kg/m as calculated from the following:   Height as of this encounter: 6\' 1"  (1.854 m).   Weight as of this encounter: 260 lb 11.2 oz (118.3 kg).  LABS: CBC:    Component Value Date/Time   WBC 18.2 (H) 02/20/2020 0257   HGB 10.6 (L) 02/20/2020 0257   HGB 14.6 08/27/2014 1410   HCT 32.1 (L) 02/20/2020 0257   HCT 42.8 08/27/2014 1410   PLT 304 02/20/2020 0257   PLT 214 08/27/2014 1410   MCV 90.9 02/20/2020 0257   MCV 86 08/27/2014 1410   NEUTROABS 4.5 01/30/2020 0937   LYMPHSABS 1.0 01/30/2020 0937   MONOABS 0.8 01/30/2020 0937   EOSABS 0.2 01/30/2020 0937   BASOSABS 0.0 01/30/2020 0937   Comprehensive Metabolic Panel:    Component Value Date/Time   NA 135 02/20/2020 0257   NA 140 12/03/2016 1632   NA 141 08/27/2014 1410   K 5.1 02/20/2020 0257   K 3.0 (L) 08/27/2014 1410   CL 102 02/20/2020 0257   CL 102 08/27/2014 1410   CO2 17 (L) 02/20/2020 0257   CO2 32 08/27/2014 1410   BUN 78 (H) 02/20/2020 0257   BUN 18 12/03/2016 1632   BUN 14 08/27/2014 1410   CREATININE 4.06 (H) 02/20/2020 0257   CREATININE 0.95 08/27/2014 1410   GLUCOSE 136 (H) 02/20/2020 0257   GLUCOSE 112 (H) 08/27/2014 1410   CALCIUM 9.4 02/20/2020 0257   CALCIUM 8.9 08/27/2014 1410   AST 18 01/30/2020 0937   ALT 14 01/30/2020 0937   ALKPHOS 53 01/30/2020 0937   BILITOT 0.6 01/30/2020 0937   BILITOT 0.3 12/03/2016 1632   PROT 7.7 01/30/2020 0937   PROT 7.7 12/03/2016 1632   ALBUMIN 3.8 01/30/2020 0937   ALBUMIN 4.5 12/03/2016 1632    RADIOGRAPHIC STUDIES: DG Abd 1 View  Result Date: 02/18/2020 CLINICAL DATA:   Dyspnea and abdominal pain. Per physician notes - States he feels dyspnea with exertion as he recently used the restroom with Physical Therapy. Denies chest pain. Patient also denies any symptoms when he went into A fib with RVR last evening. Hx - prostate cancer, DM, HTN, PNA, former smoker. EXAM: ABDOMEN - 1 VIEW COMPARISON:  CT, 02/13/2020 FINDINGS: Increased bowel gas, but no bowel dilation to suggest obstruction. A catheter projects in the lower pelvis consistent with a urinary catheter. There are 3 radiation therapy markers projecting in the region of the prostate gland. A calcification is noted in the right upper quadrant consistent with a gallstone as was noted on the recent CT. There are scattered aortic and iliac atherosclerotic vascular calcifications. Soft tissues are poorly defined but otherwise unremarkable. No acute skeletal abnormality. Lung base nodules are incompletely imaged, better noted on the recent prior CT. IMPRESSION: 1. No acute findings.  No evidence of bowel obstruction. Electronically Signed   By: Lajean Manes M.D.   On: 02/18/2020 17:17   CT ANGIO CHEST PE W OR WO CONTRAST  Result Date: 02/13/2020 CLINICAL DATA:  Atrial fibrillation, elevated D-dimer, prostate cancer EXAM: CT ANGIOGRAPHY CHEST WITH CONTRAST TECHNIQUE: Multidetector CT imaging of the chest was performed using the standard protocol during bolus administration of intravenous contrast. Multiplanar CT image reconstructions and MIPs were obtained to evaluate the vascular anatomy. CONTRAST:  4mL OMNIPAQUE IOHEXOL 350 MG/ML SOLN COMPARISON:  None. FINDINGS: Cardiovascular: There is adequate opacification of the pulmonary arterial tree. There are multiple branching central intraluminal filling defects identified within the right upper and right lower lobar pulmonary arteries as  well as multiple segmental pulmonary arteries of the left lower lobe in keeping with acute pulmonary embolism. The central pulmonary arteries are  enlarged in keeping with changes of pulmonary arterial hypertension. However, there is no CT evidence of right heart strain with the right to left ventricular ratio within normal limits. Moderate coronary artery calcification. Cardiac size is mildly enlarged. Moderate calcification of the mitral valve annulus. No pericardial effusion. The thoracic aorta is of normal caliber. Mild atherosclerotic calcification is seen within the aortic arch and descending thoracic aorta. Mediastinum/Nodes: There is pathologic bilateral hilar adenopathy. Index lymph node within the right hilum measures 16 mm x 25 mm at axial image # 42/4. Thyroid unremarkable. Esophagus unremarkable. Lungs/Pleura: Multiple bilateral pulmonary masses are identified most in keeping with pulmonary metastatic disease in this patient with a known history of prostate cancer. Index lesion within the right lower lobe measures 3.5 x 5.2 cm at axial image # 62/6. At least 50 nodules are seen scattered throughout the lungs bilaterally. No pneumothorax or pleural effusion. The central airways are widely patent. Upper Abdomen: Unremarkable Musculoskeletal: Multiple sclerotic metastases are identified within the visualized axial skeleton involving the scapula bilaterally, the sternum, the thoracic spine, and multiple ribs bilaterally. No pathologic fracture. Review of the MIP images confirms the above findings. IMPRESSION: Acute pulmonary embolism. Moderate embolic burden. No CT evidence of right heart strain. Moderate coronary artery calcification.  Mild global cardiomegaly. Innumerable bilateral pulmonary masses and nodules as well as bilateral pathologic hilar adenopathy and widespread sclerotic osseous metastases in keeping with visceral and osseous metastatic disease related to the patient's underlying prostate cancer. Aortic Atherosclerosis (ICD10-I70.0). Electronically Signed   By: Fidela Salisbury MD   On: 02/13/2020 23:27   US RENAL  Result Date:  02/19/2020 CLINICAL DATA:  Acute renal injury EXAM: RENAL / URINARY TRACT ULTRASOUND COMPLETE COMPARISON:  CT from 02/13/2020 FINDINGS: Right Kidney: Renal measurements: 13.0 x 6.8 x 5.7 cm. = volume: 266 mL. Mild to moderate hydronephrosis is noted. Left Kidney: Renal measurements: 14.8 x 7.1 x 6.1 cm. = volume: 333 mL. Moderate to severe hydronephrosis is noted on the left. Additionally there is a 3.9 cm cystic lesion in the lower pole which appears simple in nature. Bladder: Decompressed by Foley catheter. Multiple bladder calculi are again identified and stable. Other: None. IMPRESSION: Stable appearing hydronephrosis bilaterally worse on the left than the right this is stable from prior CT examination. Left renal cyst. Multiple bladder calculi. Electronically Signed   By: Inez Catalina M.D.   On: 02/19/2020 20:12   US BIOPSY (LIVER)  Result Date: 02/17/2020 INDICATION: 74 year old with history of prostate cancer and evidence for metastatic disease in the chest and liver. Tissue diagnosis is needed. EXAM: ULTRASOUND-GUIDED LIVER LESION BIOPSY MEDICATIONS: None. ANESTHESIA/SEDATION: Moderate (conscious) sedation was employed during this procedure. A total of Versed 1.0 mg and Fentanyl 100 mcg was administered intravenously. Moderate Sedation Time: 16 minutes. The patient's level of consciousness and vital signs were monitored continuously by radiology nursing throughout the procedure under my direct supervision. FLUOROSCOPY TIME:  None COMPLICATIONS: None immediate. PROCEDURE: Informed written consent was obtained from the patient after a thorough discussion of the procedural risks, benefits and alternatives. All questions were addressed. A timeout was performed prior to the initiation of the procedure. The liver was evaluated with ultrasound. Hypoechoic lesion along the inferior right hepatic lobe was identified and targeted for biopsy. The right side of the abdomen was prepped with chlorhexidine and  sterile field was created. Skin  and soft tissues were anesthetized using 1% lidocaine. Small incision was made. Using ultrasound guidance, 17 gauge coaxial needle was directed into the right hepatic lobe and into the hypoechoic lesion. Total of 3 core biopsies were obtained with 18 gauge core device. Gel-Foam slurry was injected through the 17 gauge coaxial needle as it was removed. Bandage placed over the puncture site. FINDINGS: Scattered hypoechoic lesions in the liver. Lesion in the inferior right hepatic lobe was sampled. Biopsy needle was confirmed within the lesion. Adequate specimens were obtained and placed in formalin. No immediate bleeding or hematoma formation. IMPRESSION: Ultrasound-guided core biopsy of right hepatic lesion. Electronically Signed   By: Markus Daft M.D.   On: 02/17/2020 16:16   US Venous Img Lower Bilateral (DVT)  Result Date: 02/14/2020 CLINICAL DATA:  Pulmonary embolus EXAM: LEFT LOWER EXTREMITY VENOUS DOPPLER ULTRASOUND TECHNIQUE: Gray-scale sonography with graded compression, as well as color Doppler and duplex ultrasound were performed to evaluate the lower extremity deep venous systems from the level of the common femoral vein and including the common femoral, femoral, profunda femoral, popliteal and calf veins including the posterior tibial, peroneal and gastrocnemius veins when visible. The superficial great saphenous vein was also interrogated. Spectral Doppler was utilized to evaluate flow at rest and with distal augmentation maneuvers in the common femoral, femoral and popliteal veins. COMPARISON:  None. FINDINGS: Contralateral Common Femoral Vein: Respiratory phasicity is normal and symmetric with the symptomatic side. No evidence of thrombus. Normal compressibility. Common Femoral Vein: No evidence of thrombus. Normal compressibility, respiratory phasicity and response to augmentation. Saphenofemoral Junction: No evidence of thrombus. Normal compressibility and flow on  color Doppler imaging. Profunda Femoral Vein: No evidence of thrombus. Normal compressibility and flow on color Doppler imaging. Femoral Vein: No evidence of thrombus. Normal compressibility, respiratory phasicity and response to augmentation. Popliteal Vein: No evidence of thrombus. Normal compressibility, respiratory phasicity and response to augmentation. Calf Veins: There is nonocclusive thrombus within the left peroneal veins. Superficial Great Saphenous Vein: No evidence of thrombus. Normal compressibility. Venous Reflux:  None. Other Findings:  None. IMPRESSION: Nonocclusive thrombus within the left peroneal veins. Electronically Signed   By: Ulyses Jarred M.D.   On: 02/14/2020 01:36   DG Chest Port 1 View  Result Date: 02/18/2020 CLINICAL DATA:  Dyspnea, abdominal pain EXAM: PORTABLE CHEST 1 VIEW COMPARISON:  Chest radiographs, 11/13/2015, CT chest 02/13/2020 FINDINGS: The heart size and mediastinal contours are within normal limits. Low volume AP portable examination without acute abnormality. Multiple bilateral pulmonary masses and nodules. The visualized skeletal structures are unremarkable. IMPRESSION: 1. No acute abnormality of the lungs in low volume AP portable examination. 2. Multiple bilateral pulmonary masses and nodules as seen on prior CT and in keeping with metastatic disease. Electronically Signed   By: Eddie Candle M.D.   On: 02/18/2020 17:15   DG OR UROLOGY CYSTO IMAGE ([place])  Result Date: 02/07/2020 There is no interpretation for this exam.  This order is for images obtained during a surgical procedure.  Please See "Surgeries" Tab for more information regarding the procedure.   ECHOCARDIOGRAM COMPLETE  Result Date: 02/14/2020    ECHOCARDIOGRAM REPORT   Patient Name:   ARSHAWN VALDEZ Date of Exam: 02/14/2020 Medical Rec #:  [id number]       Height:       73.0 in Accession #:    [id number]      Weight:       270.0 lb Date of Birth:  10-26-1945  BSA:          2.444 m  Patient Age:    10 years        BP:           132/71 mmHg Patient Gender: M               HR:           70 bpm. Exam Location:  [facility] Procedure: 2D Echo, Color Doppler, Cardiac Doppler and Intracardiac            Opacification Agent Indications:     I48.91 Atrial Fibrillation  History:         Patient has prior history of Echocardiogram examinations.                  Stroke; Risk Factors:Sleep Apnea, Hypertension, Dyslipidemia                  and Diabetes.  Sonographer:     Charmayne Sheer RDCS (AE) Referring Phys:  [id number] [place] Diagnosing Phys: Neoma Laming MD  Sonographer Comments: Technically difficult study due to poor echo windows. Image acquisition challenging due to patient body habitus. IMPRESSIONS  1. Left ventricular ejection fraction, by estimation, is 60 to 65%. The left ventricle has normal function. The left ventricle has no regional wall motion abnormalities. There is mild left ventricular hypertrophy. Left ventricular diastolic parameters are consistent with Grade I diastolic dysfunction (impaired relaxation).  2. Right ventricular systolic function is normal. The right ventricular size is normal.  3. Left atrial size was mildly dilated.  4. Right atrial size was mildly dilated.  5. The mitral valve is normal in structure. No evidence of mitral valve regurgitation. No evidence of mitral stenosis.  6. The aortic valve is normal in structure. Aortic valve regurgitation is not visualized. Mild aortic valve sclerosis is present, with no evidence of aortic valve stenosis.  7. The inferior vena cava is normal in size with greater than 50% respiratory variability, suggesting right atrial pressure of 3 mmHg. FINDINGS  Left Ventricle: Left ventricular ejection fraction, by estimation, is 60 to 65%. The left ventricle has normal function. The left ventricle has no regional wall motion abnormalities. Definity contrast agent was given IV to delineate the left ventricular  endocardial Jayln [place]. The left  ventricular internal cavity size was normal in size. There is mild left ventricular hypertrophy. Left ventricular diastolic parameters are consistent with Grade I diastolic dysfunction (impaired relaxation). Right Ventricle: The right ventricular size is normal. No increase in right ventricular wall thickness. Right ventricular systolic function is normal. Left Atrium: Left atrial size was mildly dilated. Right Atrium: Right atrial size was mildly dilated. Pericardium: There is no evidence of pericardial effusion. Mitral Valve: The mitral valve is normal in structure. No evidence of mitral valve regurgitation. No evidence of mitral valve stenosis. MV peak gradient, 6.0 mmHg. The mean mitral valve gradient is 2.0 mmHg. Tricuspid Valve: The tricuspid valve is normal in structure. Tricuspid valve regurgitation is not demonstrated. No evidence of tricuspid stenosis. Aortic Valve: The aortic valve is normal in structure. Aortic valve regurgitation is not visualized. Mild aortic valve sclerosis is present, with no evidence of aortic valve stenosis. Aortic valve mean gradient measures 3.0 mmHg. Aortic valve peak gradient measures 7.3 mmHg. Pulmonic Valve: The pulmonic valve was normal in structure. Pulmonic valve regurgitation is not visualized. No evidence of pulmonic stenosis. Aorta: The aortic root is normal in size and structure. Venous: The inferior vena  cava is normal in size with greater than 50% respiratory variability, suggesting right atrial pressure of 3 mmHg. IAS/Shunts: No atrial level shunt detected by color flow Doppler.   LV Volumes (MOD) LV vol d, MOD A4C: 91.4 ml Diastology LV vol s, MOD A4C: 26.8 ml LV e' medial:    8.81 cm/s LV SV MOD A4C:     91.4 ml LV E/e' medial:  7.1                            LV e' lateral:   12.40 cm/s                            LV E/e' lateral: 5.0  AORTIC VALVE AV Vmax:           135.00 cm/s AV Vmean:          84.100 cm/s AV VTI:            0.227 m AV Peak Grad:      7.3 mmHg  AV Mean Grad:      3.0 mmHg LVOT Vmax:         84.20 cm/s LVOT Vmean:        56.100 cm/s LVOT VTI:          0.108 m LVOT/AV VTI ratio: 0.48 MITRAL VALVE MV Area (PHT): 4.49 cm    SHUNTS MV Peak grad:  6.0 mmHg    Systemic VTI: 0.11 m MV Mean grad:  2.0 mmHg MV Vmax:       1.22 m/s MV Vmean:      64.5 cm/s MV Decel Time: 169 msec MV E velocity: 62.60 cm/s MV A velocity: 85.30 cm/s MV E/A ratio:  0.73 Neoma Laming MD Electronically signed by Neoma Laming MD Signature Date/Time: 02/14/2020/4:30:36 PM    Final    CT Renal Stone Study  Result Date: 02/13/2020 CLINICAL DATA:  Prostate cancer. Pink colored urine. Hypotension. Flank pain. EXAM: CT ABDOMEN AND PELVIS WITHOUT CONTRAST TECHNIQUE: Multidetector CT imaging of the abdomen and pelvis was performed following the standard protocol without IV contrast. COMPARISON:  12/23/2017 CT abdomen/pelvis. FINDINGS: Lower chest: Numerous (greater than 20) solid pulmonary nodules and masses at both lung bases, largest 5.2 cm in the basilar right lower lobe (series 4/image 27) and 5.5 cm in the basilar left lower lobe (series 4/image 27), all new. Atherosclerosis. Hepatobiliary: Multiple (at least 5) hypodense liver masses scattered throughout the liver, largest 3.8 cm in the segment 6 right liver lobe (series 2/image 36) and 2.0 cm in the superior right liver (series 2/image 22), all new. Cholelithiasis. No gallbladder wall thickening or pericholecystic fluid. No biliary ductal dilatation. Pancreas: Normal, with no mass or duct dilation. Spleen: Normal size. No mass. Adrenals/Urinary Tract: Normal right adrenal. Left adrenal 1.5 cm nodule with density 17 HU, stable, most compatible with an adenoma. Moderate bilateral hydroureteronephrosis to the level of the ureterovesical junctions bilaterally. No renal stones. No ureteral stones. Simple 3.7 cm posterior lower left renal cyst. No additional contour deforming renal lesions. Multiple layering stones in distended bladder,  largest 19 mm. Chronic mild diffuse bladder wall thickening is unchanged. Foley catheter in place with balloon in the dependent bladder. Stomach/Bowel: Normal non-distended stomach. Normal caliber small bowel with no small bowel wall thickening. Normal appendix. Normal large bowel with no diverticulosis, large bowel wall thickening or pericolonic fat stranding. Vascular/Lymphatic: Atherosclerotic nonaneurysmal abdominal aorta.  No pathologically enlarged lymph nodes in the abdomen or pelvis. Reproductive: Newly mildly enlarged, irregular and poorly marginated prostate, directly contiguous with the bladder trigone. Fiducial markers noted in the prostate as before. Other: No pneumoperitoneum, ascites or focal fluid collection. Solid subcutaneous 2.9 cm lateral right chest wall mass (series 2/image 29), new. Similar solid subcutaneous 4.4 cm left gluteal mass (series 2/image 63). Small fat containing umbilical hernia is stable. Musculoskeletal: Numerous new sclerotic osseous lesions throughout the visualized skeleton including multiple lower ribs bilaterally, multiple thoracolumbar vertebrae largest at T11 and throughout the bilateral pelvic girdle, most prominent in the left iliac crest (series 2/image 60). Marked lumbar spondylosis. IMPRESSION: 1. Numerous new solid pulmonary nodules and masses at both lung bases, largest 5.2 cm in the basilar right lower lobe, compatible with pulmonary metastases. 2. New widespread sclerotic osseous metastases throughout the visualized skeleton as detailed. 3. Multiple new hypodense liver masses compatible with liver metastases. 4. Newly mildly enlarged, irregular and poorly marginated prostate, directly contiguous with the bladder trigone, suggestive of recurrent locally advanced prostate cancer. 5. Moderate bilateral hydroureteronephrosis to the level of the UVJ bilaterally, probably due to bladder outlet obstruction despite the well-positioned Foley catheter in the bladder. 6.  Multiple layering bladder stones in the distended bladder. 7. Cholelithiasis. 8. Stable left adrenal adenoma. 9. Aortic Atherosclerosis (ICD10-I70.0). Electronically Signed   By: Ilona Sorrel M.D.   On: 02/13/2020 10:06   US Abdomen Limited RUQ  Result Date: 02/17/2020 CLINICAL DATA:  74 year old with history of prostate cancer. Suspicious liver lesions on noncontrast CT. EXAM: ULTRASOUND ABDOMEN LIMITED RIGHT UPPER QUADRANT COMPARISON:  CT renal stone protocol 02/13/2020 FINDINGS: Gallbladder: Echogenic stone at the base of the gallbladder measuring up to 1.0 cm. Common bile duct: Diameter: 0.5 cm Liver: Liver parenchyma is mildly heterogeneous. There are scattered hypoechoic liver lesions that correspond with the previous CT findings. Round hypoechoic lesion in the right hepatic lobe measures up to 2.5 cm. Round hypoechoic lesion along the inferior margin of the right hepatic lobe measures up to 2.3 cm. Large hypoechoic lesion along the posterior aspect of the liver measures up to 4.2 cm. Portal vein is patent on color Doppler imaging with normal direction of blood flow towards the liver. Other: Partially visualized right kidney demonstrates dilatation of the right renal collecting system and similar to the recent CT. IMPRESSION: 1. Hypoechoic liver lesions. Findings are suggestive for metastatic disease. Patient is scheduled for ultrasound-guided core biopsy of a hepatic lesion. 2. Cholelithiasis. No evidence for gallbladder inflammation or biliary dilatation. Electronically Signed   By: Markus Daft M.D.   On: 02/17/2020 09:33    PERFORMANCE STATUS (ECOG) : 4 - Bedbound  Review of Systems Unless otherwise noted, a complete review of systems is negative.  Physical Exam General: Frail appearing Cardiovascular: regular rate and rhythm Pulmonary: Labored, on O2 Abdomen: soft, nontender, + bowel sounds GU: no suprapubic tenderness Extremities: no edema, no joint deformities Skin: no  rashes Neurological: Weakness but otherwise nonfocal  IMPRESSION: Joint visit with Dr. [name].  Patient is status post liver biopsy on 10/15 and awaiting path.  Today, patient's breathing appears somewhat labored.  He has urinary retention with clotted Foley and uptrending serum creatinine and is pending urology follow-up.  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  His immediate goals seem aligned with the current scope of treatment.  Patient does not have ACP documents on file but says that his daughter  is his healthcare power of attorney.  He gave me permission to contact his daughter.  I called but did not reach her.  We will try again to reach family to discuss goals.  I discussed CODE STATUS with patient.  He said he had initially opted for a full code but is now reevaluating these decisions.  He is not sure that he would want intubation.  I encouraged him to speak with his daughter and he also gave me permission to discuss it with her.  Symptomatically, he endorses occasional back pain but none at present.  PLAN: -Continue current scope of treatment -Will speak with family about goals   Time Total: 60 minutes  Visit consisted of counseling and education dealing with the complex and emotionally intense issues of symptom management and palliative care in the setting of serious and potentially life-threatening illness.Greater than 50%  of this time was spent counseling and coordinating care related to the above assessment and plan.  Signed by: Altha Harm, PhD, NP-C

## 2020-02-20 NOTE — Care Management Important Message (Signed)
Important Message  Patient Details  Name: Timothy Maddox MRN: [id number] Date of Birth: [birth year]   Medicare Important Message Given:  Yes     Dannette Barbara 02/20/2020, 3:17 PM

## 2020-02-21 DIAGNOSIS — I4891 Unspecified atrial fibrillation: Secondary | ICD-10-CM | POA: Diagnosis not present

## 2020-02-21 DIAGNOSIS — J9601 Acute respiratory failure with hypoxia: Secondary | ICD-10-CM | POA: Diagnosis not present

## 2020-02-21 DIAGNOSIS — N179 Acute kidney failure, unspecified: Secondary | ICD-10-CM | POA: Diagnosis not present

## 2020-02-21 DIAGNOSIS — I2699 Other pulmonary embolism without acute cor pulmonale: Secondary | ICD-10-CM | POA: Diagnosis not present

## 2020-02-21 DIAGNOSIS — T83511S Infection and inflammatory reaction due to indwelling urethral catheter, sequela: Secondary | ICD-10-CM

## 2020-02-21 DIAGNOSIS — I5032 Chronic diastolic (congestive) heart failure: Secondary | ICD-10-CM

## 2020-02-21 DIAGNOSIS — C61 Malignant neoplasm of prostate: Secondary | ICD-10-CM | POA: Diagnosis not present

## 2020-02-21 DIAGNOSIS — D500 Iron deficiency anemia secondary to blood loss (chronic): Secondary | ICD-10-CM | POA: Diagnosis present

## 2020-02-21 DIAGNOSIS — N390 Urinary tract infection, site not specified: Secondary | ICD-10-CM | POA: Diagnosis not present

## 2020-02-21 LAB — CBC
HCT: 29 % — ABNORMAL LOW (ref 39.0–52.0)
Hemoglobin: 9.4 g/dL — ABNORMAL LOW (ref 13.0–17.0)
MCH: 29.4 pg (ref 26.0–34.0)
MCHC: 32.4 g/dL (ref 30.0–36.0)
MCV: 90.6 fL (ref 80.0–100.0)
Platelets: 329 10*3/uL (ref 150–400)
RBC: 3.2 MIL/uL — ABNORMAL LOW (ref 4.22–5.81)
RDW: 14.8 % (ref 11.5–15.5)
WBC: 15.4 10*3/uL — ABNORMAL HIGH (ref 4.0–10.5)
nRBC: 0.1 % (ref 0.0–0.2)

## 2020-02-21 LAB — BASIC METABOLIC PANEL
Anion gap: 13 (ref 5–15)
BUN: 88 mg/dL — ABNORMAL HIGH (ref 8–23)
CO2: 18 mmol/L — ABNORMAL LOW (ref 22–32)
Calcium: 9.1 mg/dL (ref 8.9–10.3)
Chloride: 105 mmol/L (ref 98–111)
Creatinine, Ser: 2.98 mg/dL — ABNORMAL HIGH (ref 0.61–1.24)
GFR, Estimated: 20 mL/min — ABNORMAL LOW (ref >60)
Glucose, Bld: 135 mg/dL — ABNORMAL HIGH (ref 70–99)
Potassium: 5 mmol/L (ref 3.5–5.1)
Sodium: 136 mmol/L (ref 135–145)

## 2020-02-21 LAB — HEPARIN LEVEL (UNFRACTIONATED): Heparin Unfractionated: >3.6 IU/mL — ABNORMAL HIGH (ref 0.30–0.70)

## 2020-02-21 LAB — APTT: aPTT: >160 seconds (ref 24–36)

## 2020-02-21 LAB — MAGNESIUM: Magnesium: 2.7 mg/dL — ABNORMAL HIGH (ref 1.7–2.4)

## 2020-02-21 MED ORDER — MORPHINE SULFATE (CONCENTRATE) 10 MG/0.5ML PO SOLN
5.0000 mg | ORAL | Status: DC | PRN
  Administered 2020-02-22: 5 mg via ORAL
  Filled 2020-02-21: qty 0.5

## 2020-02-21 MED ORDER — HEPARIN (PORCINE) 25000 UT/250ML-% IV SOLN
1700.0000 [IU]/h | INTRAVENOUS | Status: DC
  Administered 2020-02-21 (×2): 1700 [IU]/h via INTRAVENOUS
  Filled 2020-02-21: qty 250

## 2020-02-21 MED ORDER — AMIODARONE HCL 200 MG PO TABS
200.0000 mg | ORAL_TABLET | Freq: Two times a day (BID) | ORAL | Status: DC
  Administered 2020-02-22 – 2020-02-24 (×6): 200 mg via ORAL
  Filled 2020-02-21 (×7): qty 1

## 2020-02-21 MED ORDER — MORPHINE SULFATE (CONCENTRATE) 10 MG/0.5ML PO SOLN
5.0000 mg | ORAL | Status: DC | PRN
  Administered 2020-02-24: 5 mg via SUBLINGUAL
  Filled 2020-02-21: qty 0.5

## 2020-02-21 NOTE — Progress Notes (Signed)
Hematology/Oncology Progress Note [facility] [phone] [phone]  Patient Care Team: [name], MD as PCP - General (Internal Medicine) Abbie Sons, MD (Urology)   Name of the patient: Timothy Maddox  [id number]  Sep 07, 1945  Date of visit: 02/21/20   INTERVAL HISTORY-  No acute events overnight.  Today's creatinine came down to 2.98, Patient remains on nasal cannula oxygen.  Wife at the bedside. Patient denies any pain currently.  Review of systems- Review of Systems  Constitutional: Positive for fatigue. Negative for chills and fever.  HENT:   Negative for hearing loss and voice change.   Eyes: Negative for eye problems and icterus.  Respiratory: Positive for shortness of breath. Negative for chest tightness and cough.   Cardiovascular: Negative for chest pain and leg swelling.  Gastrointestinal: Negative for abdominal distention and abdominal pain.  Endocrine: Negative for hot flashes.  Genitourinary: Positive for hematuria. Negative for difficulty urinating, dysuria and frequency.   Musculoskeletal: Negative for arthralgias.  Skin: Negative for itching and rash.  Neurological: Negative for light-headedness and numbness.  Hematological: Negative for adenopathy. Does not bruise/bleed easily.  Psychiatric/Behavioral: Negative for confusion.    Allergies  Allergen Reactions  . Poison Oak Extract Rash  . Poison Ivy Extract Rash    Patient Active Problem List   Diagnosis Date Noted  . Palliative care encounter   . PE (pulmonary thromboembolism) ([place])   . Liver mass   . Hematuria 02/13/2020  . Hypotension 02/13/2020  . Urinary tract infection associated with indwelling urethral catheter ([place]) 02/13/2020  . Atrial fibrillation with RVR ([place]) 02/13/2020  . Prediabetes   . Hypertension   . Type II diabetes mellitus with renal manifestations ([place])   . AKI (acute kidney injury) ([place])   . Chronic diastolic CHF  (congestive heart failure) ([place])   . Stage 3a chronic kidney disease ([place]) 01/30/2020  . Androgen deprivation therapy 01/30/2020  . Encounter for antineoplastic chemotherapy 09/24/2018  . Weak urine stream 09/24/2018  . Rising PSA level 07/08/2018  . Prostate cancer ([place]) 07/08/2018  . Goals of care, counseling/discussion 12/31/2017  . Osteoarthritis of right knee 07/20/2017  . Seizure ([place]) 12/19/2015  . 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 Seizures ([place]) 12/19/2015  . AVF (arteriovenous fistula) ([place]) 11/16/2015  . ICH (intracerebral hemorrhage) ([place]) 11/16/2015  . Stroke [facility]) 11/13/2015     Past Medical History:  Diagnosis Date  . Anxiety   . Arthritis    lower back, shoulders  . AVM (arteriovenous malformation) brain   . Cancer [facility]    prostate- treated with radiation  . CVA (cerebral infarction) 11/2015  . Depression   . Diabetes mellitus without complication ([place])   . Dyspnea    with exertion  . HOH (hard of hearing)   . Hyperlipidemia   . Hypertension   . Pneumonia    1st grade  . Pre-diabetes   . Prediabetes   . Seizures ([place]) 12/2015  . Sleep apnea   . Stroke [facility])    speech impairment, no weakness     Past Surgical History:  Procedure Laterality Date  . BACK SURGERY  1999   disectomy  . BACK SURGERY     2 surgery- injury  . COLONOSCOPY W/ POLYPECTOMY    . COLONOSCOPY WITH PROPOFOL N/A 02/28/2019   Procedure: COLONOSCOPY WITH PROPOFOL;  Surgeon: Toledo, Benay Pike, MD;  Location: [facility] ENDOSCOPY;  Service: Gastroenterology;  Laterality: N/A;  . CYSTOSCOPY WITH URETHRAL DILATATION Bilateral 02/07/2020  Procedure: CYSTOSCOPY WITH URETHRAL DILATATION;  Surgeon: Abbie Sons, MD;  Location: [facility] ORS;  Service: Urology;  Laterality: Bilateral;  . HAMMER TOE SURGERY Bilateral   . INGUINAL HERNIA REPAIR Right    per patient when he was in 1st grade  . IR GENERIC HISTORICAL  12/05/2015   IR RADIOLOGIST EVAL & MGMT 12/05/2015 [facility]    . IR GENERIC HISTORICAL  01/31/2016   IR RADIOLOGIST EVAL & MGMT 01/31/2016 [facility]  . IR GENERIC HISTORICAL  02/20/2016   IR ANGIO INTRA EXTRACRAN SEL COM CAROTID INNOMINATE UNI L MOD SED 02/20/2016 [name], MD [facility]  . IR GENERIC HISTORICAL  02/20/2016   IR 3D INDEPENDENT WKST 02/20/2016 [name], MD [facility]  . KNEE SURGERY Bilateral 2015  . RADIOLOGY WITH ANESTHESIA N/A 02/20/2016   Procedure: EMBOLIZATION;  Surgeon: [name], MD;  Location: [place];  Service: Radiology;  Laterality: N/A;  . TOE SURGERY Left 2017   TOE NAIL REMOVAL  . TOTAL KNEE ARTHROPLASTY Right 07/20/2017   Procedure: TOTAL KNEE ARTHROPLASTY;  Surgeon: Lovell Sheehan, MD;  Location: [facility] ORS;  Service: Orthopedics;  Laterality: Right;    Social History   Socioeconomic History  . Marital status: Married    Spouse name: Not on file  . Number of children: Not on file  . Years of education: Not on file  . Highest education level: Not on file  Occupational History  . Occupation: retired Curator  Tobacco Use  . Smoking status: Former Smoker    Packs/day: 1.00    Years: 2.00    Pack years: 2.00    Types: Cigarettes    Quit date: 05/05/1966    Years since quitting: 53.8  . Smokeless tobacco: Never Used  . Tobacco comment: quit age 68  Vaping Use  . Vaping Use: Never used  Substance and Sexual Activity  . Alcohol use: Not Currently  . Drug use: No  . Sexual activity: Not Currently  Other Topics Concern  . Not on file  Social History Narrative  . Not on file   Social Determinants of Health   Financial Resource Strain:   . Difficulty of Paying Living Expenses: Not on file  Food Insecurity:   . Worried About Charity fundraiser in the Last Year: Not on file  . Ran Out of Food in the Last Year: Not on file  Transportation Needs:   . Lack of Transportation (Medical): Not on file  . Lack of Transportation (Non-Medical): Not on file  Physical Activity:   .  Days of Exercise per Week: Not on file  . Minutes of Exercise per Session: Not on file  Stress:   . Feeling of Stress : Not on file  Social Connections:   . Frequency of Communication with Friends and Family: Not on file  . Frequency of Social Gatherings with Friends and Family: Not on file  . Attends Religious Services: Not on file  . Active Member of Clubs or Organizations: Not on file  . Attends Archivist Meetings: Not on file  . Marital Status: Not on file  Intimate Partner Violence:   . Fear of Current or Ex-Partner: Not on file  . Emotionally Abused: Not on file  . Physically Abused: Not on file  . Sexually Abused: Not on file     Family History  Problem Relation Age of Onset  . Heart failure Father   . Diabetes Father   . Hypertension Father   .  Alzheimer's disease Father 41  . Other Mother 29       homicide  . Alcohol abuse Mother   . Pancreatic cancer Sister   . Alcohol abuse Sister   . Diabetes Brother   . Diabetes Paternal Aunt   . Diabetes Paternal Grandmother   . Diabetes Paternal Grandfather      Current Facility-Administered Medications:  .  acetaminophen (TYLENOL) tablet 650 mg, 650 mg, Oral, Q6H PRN, Ivor Costa, MD, 650 mg at 02/21/20 0029 .  acetaminophen-codeine (TYLENOL #3) 300-30 MG per tablet 1 tablet, 1 tablet, Oral, Q4H PRN, Earlie Server, MD, 1 tablet at 02/19/20 2331 .  amiodarone (PACERONE) tablet 200 mg, 200 mg, Oral, BID, Adaline Sill, NP .  Chlorhexidine Gluconate Cloth 2 % PADS 6 each, 6 each, Topical, Daily, Ivor Costa, MD, 6 each at 02/21/20 1015 .  ferrous sulfate tablet 325 mg, 325 mg, Oral, BID WC, Earlie Server, MD, 325 mg at 02/21/20 0916 .  heparin ADULT infusion 100 units/mL (25000 units/259mL sodium chloride 0.45%), 1,700 Units/hr, Intravenous, Continuous, Enzo Bi, MD, Last Rate: 17 mL/hr at 02/21/20 1533, 1,700 Units/hr at 02/21/20 1533 .  hydrALAZINE (APRESOLINE) injection 5 mg, 5 mg, Intravenous, Q2H PRN, Ivor Costa, MD .   levETIRAcetam (KEPPRA) tablet 500 mg, 500 mg, Oral, BID, Ivor Costa, MD, 500 mg at 02/21/20 0916 .  LORazepam (ATIVAN) injection 0.5 mg, 0.5 mg, Intravenous, Q12H PRN, Ivor Costa, MD, 0.5 mg at 02/21/20 0028 .  LORazepam (ATIVAN) injection 0.5-1 mg, 0.5-1 mg, Intravenous, Q2H PRN, Ivor Costa, MD .  metoprolol tartrate (LOPRESSOR) tablet 50 mg, 50 mg, Oral, BID, Ivor Costa, MD, 50 mg at 02/20/20 2221 .  rosuvastatin (CRESTOR) tablet 5 mg, 5 mg, Oral, QHS, Ivor Costa, MD, 5 mg at 02/20/20 2221 .  senna-docusate (Senokot-S) tablet 2 tablet, 2 tablet, Oral, QHS, Enzo Bi, MD, 2 tablet at 02/18/20 2129 .  simethicone (MYLICON) chewable tablet 80 mg, 80 mg, Oral, QID PRN, Enzo Bi, MD, 80 mg at 02/18/20 1329 .  sodium chloride flush (NS) 0.9 % injection 3 mL, 3 mL, Intravenous, PRN, Lorella Nimrod, MD, 3 mL at 02/14/20 2314   Physical exam:  Vitals:   02/21/20 0445 02/21/20 0635 02/21/20 0900 02/21/20 1140  BP: 115/66 (!) 111/59 110/66 124/67  Pulse: 69 62 64 70  Resp: (!) 25 (!) 27 (!) 27 (!) 31  Temp: 97.7 F (36.5 C)  97.6 F (36.4 C) (!) 97.5 F (36.4 C)  TempSrc: Oral  Oral Oral  SpO2: 92% 94% 95% 95%  Weight: 257 lb 14.4 oz (117 kg)     Height:       Physical Exam Constitutional:      General: He is not in acute distress.    Appearance: He is obese. He is ill-appearing. He is not diaphoretic.  HENT:     Head: Normocephalic and atraumatic.     Nose: Nose normal.     Mouth/Throat:     Pharynx: No oropharyngeal exudate.  Eyes:     General: No scleral icterus.    Pupils: Pupils are equal, round, and reactive to light.  Cardiovascular:     Rate and Rhythm: Normal rate and regular rhythm.     Heart sounds: No murmur heard.   Pulmonary:     Effort: Pulmonary effort is normal.     Breath sounds: No rales.     Comments: Breathing nasal cannula oxygen Chest:     Chest wall: No tenderness.  Abdominal:  General: There is no distension.     Palpations: Abdomen is soft.      Tenderness: There is no abdominal tenderness.  Genitourinary:    Comments: + Foley catheter Musculoskeletal:        General: Normal range of motion.     Cervical back: Normal range of motion and neck supple.  Skin:    General: Skin is warm and dry.     Findings: No erythema.  Neurological:     Mental Status: He is alert and oriented to person, place, and time.     Cranial Nerves: No cranial nerve deficit.     Motor: No abnormal muscle tone.     Coordination: Coordination normal.  Psychiatric:        Mood and Affect: Affect normal.        CMP Latest Ref Rng & Units 02/21/2020  Glucose 70 - 99 mg/dL 135(H)  BUN 8 - 23 mg/dL 88(H)  Creatinine 0.61 - 1.24 mg/dL 2.98(H)  Sodium 135 - 145 mmol/L 136  Potassium 3.5 - 5.1 mmol/L 5.0  Chloride 98 - 111 mmol/L 105  CO2 22 - 32 mmol/L 18(L)  Calcium 8.9 - 10.3 mg/dL 9.1  Total Protein 6.5 - 8.1 g/dL -  Total Bilirubin 0.3 - 1.2 mg/dL -  Alkaline Phos 38 - 126 U/L -  AST 15 - 41 U/L -  ALT 0 - 44 U/L -   CBC Latest Ref Rng & Units 02/21/2020  WBC 4.0 - 10.5 K/uL 15.4(H)  Hemoglobin 13.0 - 17.0 g/dL 9.4(L)  Hematocrit 39 - 52 % 29.0(L)  Platelets 150 - 400 K/uL 329    RADIOGRAPHIC STUDIES: I have personally reviewed the radiological images as listed and agreed with the findings in the report. DG Abd 1 View  Result Date: 02/18/2020 CLINICAL DATA:  Dyspnea and abdominal pain. Per physician notes - States he feels dyspnea with exertion as he recently used the restroom with Physical Therapy. Denies chest pain. Patient also denies any symptoms when he went into A fib with RVR last evening. Hx - prostate cancer, DM, HTN, PNA, former smoker. EXAM: ABDOMEN - 1 VIEW COMPARISON:  CT, 02/13/2020 FINDINGS: Increased bowel gas, but no bowel dilation to suggest obstruction. A catheter projects in the lower pelvis consistent with a urinary catheter. There are 3 radiation therapy markers projecting in the region of the prostate gland. A  calcification is noted in the right upper quadrant consistent with a gallstone as was noted on the recent CT. There are scattered aortic and iliac atherosclerotic vascular calcifications. Soft tissues are poorly defined but otherwise unremarkable. No acute skeletal abnormality. Lung base nodules are incompletely imaged, better noted on the recent prior CT. IMPRESSION: 1. No acute findings.  No evidence of bowel obstruction. Electronically Signed   By: Lajean Manes M.D.   On: 02/18/2020 17:17   CT ANGIO CHEST PE W OR WO CONTRAST  Result Date: 02/13/2020 CLINICAL DATA:  Atrial fibrillation, elevated D-dimer, prostate cancer EXAM: CT ANGIOGRAPHY CHEST WITH CONTRAST TECHNIQUE: Multidetector CT imaging of the chest was performed using the standard protocol during bolus administration of intravenous contrast. Multiplanar CT image reconstructions and MIPs were obtained to evaluate the vascular anatomy. CONTRAST:  5mL OMNIPAQUE IOHEXOL 350 MG/ML SOLN COMPARISON:  None. FINDINGS: Cardiovascular: There is adequate opacification of the pulmonary arterial tree. There are multiple branching central intraluminal filling defects identified within the right upper and right lower lobar pulmonary arteries as well as multiple segmental pulmonary arteries of the  left lower lobe in keeping with acute pulmonary embolism. The central pulmonary arteries are enlarged in keeping with changes of pulmonary arterial hypertension. However, there is no CT evidence of right heart strain with the right to left ventricular ratio within normal limits. Moderate coronary artery calcification. Cardiac size is mildly enlarged. Moderate calcification of the mitral valve annulus. No pericardial effusion. The thoracic aorta is of normal caliber. Mild atherosclerotic calcification is seen within the aortic arch and descending thoracic aorta. Mediastinum/Nodes: There is pathologic bilateral hilar adenopathy. Index lymph node within the right hilum  measures 16 mm x 25 mm at axial image # 42/4. Thyroid unremarkable. Esophagus unremarkable. Lungs/Pleura: Multiple bilateral pulmonary masses are identified most in keeping with pulmonary metastatic disease in this patient with a known history of prostate cancer. Index lesion within the right lower lobe measures 3.5 x 5.2 cm at axial image # 62/6. At least 50 nodules are seen scattered throughout the lungs bilaterally. No pneumothorax or pleural effusion. The central airways are widely patent. Upper Abdomen: Unremarkable Musculoskeletal: Multiple sclerotic metastases are identified within the visualized axial skeleton involving the scapula bilaterally, the sternum, the thoracic spine, and multiple ribs bilaterally. No pathologic fracture. Review of the MIP images confirms the above findings. IMPRESSION: Acute pulmonary embolism. Moderate embolic burden. No CT evidence of right heart strain. Moderate coronary artery calcification.  Mild global cardiomegaly. Innumerable bilateral pulmonary masses and nodules as well as bilateral pathologic hilar adenopathy and widespread sclerotic osseous metastases in keeping with visceral and osseous metastatic disease related to the patient's underlying prostate cancer. Aortic Atherosclerosis (ICD10-I70.0). Electronically Signed   By: Fidela Salisbury MD   On: 02/13/2020 23:27   CT PELVIS WO CONTRAST  Result Date: 02/20/2020 CLINICAL DATA:  Urinary retention, prostate cancer, urethral stricture EXAM: CT PELVIS WITHOUT CONTRAST TECHNIQUE: Multidetector CT imaging of the pelvis was performed following the standard protocol without intravenous contrast. COMPARISON:  02/13/20. FINDINGS: Urinary Tract: Foley catheter balloon is again seen within the bladder lumen. Despite this, the bladder appears mildly distended, raising the question of obstruction of the catheter. There are numerous layering calcifications within the bladder lumen, slightly improved from prior examination but  persistent. Additionally, a small amount of calcific debris is seen within the Foley catheter lumen suggesting partial obstruction secondary to debris. Gas is seen within the bladder lumen related to probable catheterization. There is mild perivesicular inflammatory stranding, similar to prior examination, likely infectious or inflammatory in nature. There is again noted mild right and moderate left hydroureter which appears similar to that seen on prior examination suggesting obstruction of the ureters bilaterally at the ureterovesicular junctions. Locally invasive prostatic mass with invasion of the bladder trigone is again noted. Brachytherapy seeds are seen within the prostate gland. Bowel: Small broad-based umbilical hernia is again identified containing a single wall of the otherwise unremarkable mid transverse colon. The visualized large and small bowel are otherwise unremarkable. Appendix normal. No free fluid within the pelvis. Vascular/Lymphatic: Extensive aortoiliac atherosclerotic calcification. No aortic aneurysm. No pathologic adenopathy within the pelvis. Reproductive: Microlobulated prostatic mass invades the base of the bladder as described above. This is similar in appearance to prior examination. Other: Small fat containing left inguinal hernia noted. Rectum unremarkable. Musculoskeletal: Sclerotic metastases are again identified within the visualized axial skeleton in keeping with osseous metastatic disease. Similarly, 4.7 cm soft tissue mass is seen within the subcutaneous fat of the left gluteal region in keeping with a soft tissue metastasis. IMPRESSION: Stable examination. Prostatic mass invades the  base of the bladder with resultant obstruction of the ureterovesicular junctions bilaterally and mild-to-moderate hydroureter noted bilaterally. Foley catheter balloon remains within the bladder lumen, however, mild bladder distension persists and calcific debris within the catheter lumen suggests  partial obstruction due to debris. Layering bladder calculi again noted, slightly improved from prior examination possibly related to fragmentation. Perivesicular inflammatory stranding suggesting persistent infectious or inflammatory cystitis. Sclerotic osseous and subcutaneous soft tissue metastatic disease again identified. Aortic Atherosclerosis (ICD10-I70.0). Electronically Signed   By: Fidela Salisbury MD   On: 02/20/2020 16:05   US RENAL  Result Date: 02/19/2020 CLINICAL DATA:  Acute renal injury EXAM: RENAL / URINARY TRACT ULTRASOUND COMPLETE COMPARISON:  CT from 02/13/2020 FINDINGS: Right Kidney: Renal measurements: 13.0 x 6.8 x 5.7 cm. = volume: 266 mL. Mild to moderate hydronephrosis is noted. Left Kidney: Renal measurements: 14.8 x 7.1 x 6.1 cm. = volume: 333 mL. Moderate to severe hydronephrosis is noted on the left. Additionally there is a 3.9 cm cystic lesion in the lower pole which appears simple in nature. Bladder: Decompressed by Foley catheter. Multiple bladder calculi are again identified and stable. Other: None. IMPRESSION: Stable appearing hydronephrosis bilaterally worse on the left than the right this is stable from prior CT examination. Left renal cyst. Multiple bladder calculi. Electronically Signed   By: Inez Catalina M.D.   On: 02/19/2020 20:12   US BIOPSY (LIVER)  Result Date: 02/17/2020 INDICATION: 74 year old with history of prostate cancer and evidence for metastatic disease in the chest and liver. Tissue diagnosis is needed. EXAM: ULTRASOUND-GUIDED LIVER LESION BIOPSY MEDICATIONS: None. ANESTHESIA/SEDATION: Moderate (conscious) sedation was employed during this procedure. A total of Versed 1.0 mg and Fentanyl 100 mcg was administered intravenously. Moderate Sedation Time: 16 minutes. The patient's level of consciousness and vital signs were monitored continuously by radiology nursing throughout the procedure under my direct supervision. FLUOROSCOPY TIME:  None COMPLICATIONS:  None immediate. PROCEDURE: Informed written consent was obtained from the patient after a thorough discussion of the procedural risks, benefits and alternatives. All questions were addressed. A timeout was performed prior to the initiation of the procedure. The liver was evaluated with ultrasound. Hypoechoic lesion along the inferior right hepatic lobe was identified and targeted for biopsy. The right side of the abdomen was prepped with chlorhexidine and sterile field was created. Skin and soft tissues were anesthetized using 1% lidocaine. Small incision was made. Using ultrasound guidance, 17 gauge coaxial needle was directed into the right hepatic lobe and into the hypoechoic lesion. Total of 3 core biopsies were obtained with 18 gauge core device. Gel-Foam slurry was injected through the 17 gauge coaxial needle as it was removed. Bandage placed over the puncture site. FINDINGS: Scattered hypoechoic lesions in the liver. Lesion in the inferior right hepatic lobe was sampled. Biopsy needle was confirmed within the lesion. Adequate specimens were obtained and placed in formalin. No immediate bleeding or hematoma formation. IMPRESSION: Ultrasound-guided core biopsy of right hepatic lesion. Electronically Signed   By: Markus Daft M.D.   On: 02/17/2020 16:16   US Venous Img Lower Bilateral (DVT)  Result Date: 02/14/2020 CLINICAL DATA:  Pulmonary embolus EXAM: LEFT LOWER EXTREMITY VENOUS DOPPLER ULTRASOUND TECHNIQUE: Gray-scale sonography with graded compression, as well as color Doppler and duplex ultrasound were performed to evaluate the lower extremity deep venous systems from the level of the common femoral vein and including the common femoral, femoral, profunda femoral, popliteal and calf veins including the posterior tibial, peroneal and gastrocnemius veins when visible. The  superficial great saphenous vein was also interrogated. Spectral Doppler was utilized to evaluate flow at rest and with distal  augmentation maneuvers in the common femoral, femoral and popliteal veins. COMPARISON:  None. FINDINGS: Contralateral Common Femoral Vein: Respiratory phasicity is normal and symmetric with the symptomatic side. No evidence of thrombus. Normal compressibility. Common Femoral Vein: No evidence of thrombus. Normal compressibility, respiratory phasicity and response to augmentation. Saphenofemoral Junction: No evidence of thrombus. Normal compressibility and flow on color Doppler imaging. Profunda Femoral Vein: No evidence of thrombus. Normal compressibility and flow on color Doppler imaging. Femoral Vein: No evidence of thrombus. Normal compressibility, respiratory phasicity and response to augmentation. Popliteal Vein: No evidence of thrombus. Normal compressibility, respiratory phasicity and response to augmentation. Calf Veins: There is nonocclusive thrombus within the left peroneal veins. Superficial Great Saphenous Vein: No evidence of thrombus. Normal compressibility. Venous Reflux:  None. Other Findings:  None. IMPRESSION: Nonocclusive thrombus within the left peroneal veins. Electronically Signed   By: Ulyses Jarred M.D.   On: 02/14/2020 01:36   DG Chest Port 1 View  Result Date: 02/20/2020 CLINICAL DATA:  Dyspnea EXAM: PORTABLE CHEST 1 VIEW COMPARISON:  02/18/2020, CT 02/13/2020 FINDINGS: Multiple bilateral lung nodules. No acute consolidation or effusion. Enlarged cardiomediastinal silhouette. Bilateral hilar enlargement likely due to nodes. No pneumothorax. IMPRESSION: 1. No acute airspace disease. 2. Bilateral pulmonary nodules/masses and hilar enlargement, consistent with metastatic disease. Electronically Signed   By: Donavan Foil M.D.   On: 02/20/2020 18:12   DG Chest Port 1 View  Result Date: 02/18/2020 CLINICAL DATA:  Dyspnea, abdominal pain EXAM: PORTABLE CHEST 1 VIEW COMPARISON:  Chest radiographs, 11/13/2015, CT chest 02/13/2020 FINDINGS: The heart size and mediastinal contours are  within normal limits. Low volume AP portable examination without acute abnormality. Multiple bilateral pulmonary masses and nodules. The visualized skeletal structures are unremarkable. IMPRESSION: 1. No acute abnormality of the lungs in low volume AP portable examination. 2. Multiple bilateral pulmonary masses and nodules as seen on prior CT and in keeping with metastatic disease. Electronically Signed   By: Eddie Candle M.D.   On: 02/18/2020 17:15   DG OR UROLOGY CYSTO IMAGE ([place])  Result Date: 02/07/2020 There is no interpretation for this exam.  This order is for images obtained during a surgical procedure.  Please See "Surgeries" Tab for more information regarding the procedure.   ECHOCARDIOGRAM COMPLETE  Result Date: 02/14/2020    ECHOCARDIOGRAM REPORT   Patient Name:   MERLEN GURRY Date of Exam: 02/14/2020 Medical Rec #:  [id number]       Height:       73.0 in Accession #:    [id number]      Weight:       270.0 lb Date of Birth:  1946-02-26        BSA:          2.444 m Patient Age:    75 years        BP:           132/71 mmHg Patient Gender: M               HR:           70 bpm. Exam Location:  [facility] Procedure: 2D Echo, Color Doppler, Cardiac Doppler and Intracardiac            Opacification Agent Indications:     I48.91 Atrial Fibrillation  History:         Patient has prior history  of Echocardiogram examinations.                  Stroke; Risk Factors:Sleep Apnea, Hypertension, Dyslipidemia                  and Diabetes.  Sonographer:     Charmayne Sheer RDCS (AE) Referring Phys:  [id number] [place] Diagnosing Phys: Neoma Laming MD  Sonographer Comments: Technically difficult study due to poor echo windows. Image acquisition challenging due to patient body habitus. IMPRESSIONS  1. Left ventricular ejection fraction, by estimation, is 60 to 65%. The left ventricle has normal function. The left ventricle has no regional wall motion abnormalities. There is mild left ventricular hypertrophy. Left  ventricular diastolic parameters are consistent with Grade I diastolic dysfunction (impaired relaxation).  2. Right ventricular systolic function is normal. The right ventricular size is normal.  3. Left atrial size was mildly dilated.  4. Right atrial size was mildly dilated.  5. The mitral valve is normal in structure. No evidence of mitral valve regurgitation. No evidence of mitral stenosis.  6. The aortic valve is normal in structure. Aortic valve regurgitation is not visualized. Mild aortic valve sclerosis is present, with no evidence of aortic valve stenosis.  7. The inferior vena cava is normal in size with greater than 50% respiratory variability, suggesting right atrial pressure of 3 mmHg. FINDINGS  Left Ventricle: Left ventricular ejection fraction, by estimation, is 60 to 65%. The left ventricle has normal function. The left ventricle has no regional wall motion abnormalities. Definity contrast agent was given IV to delineate the left ventricular  endocardial borders. The left ventricular internal cavity size was normal in size. There is mild left ventricular hypertrophy. Left ventricular diastolic parameters are consistent with Grade I diastolic dysfunction (impaired relaxation). Right Ventricle: The right ventricular size is normal. No increase in right ventricular wall thickness. Right ventricular systolic function is normal. Left Atrium: Left atrial size was mildly dilated. Right Atrium: Right atrial size was mildly dilated. Pericardium: There is no evidence of pericardial effusion. Mitral Valve: The mitral valve is normal in structure. No evidence of mitral valve regurgitation. No evidence of mitral valve stenosis. MV peak gradient, 6.0 mmHg. The mean mitral valve gradient is 2.0 mmHg. Tricuspid Valve: The tricuspid valve is normal in structure. Tricuspid valve regurgitation is not demonstrated. No evidence of tricuspid stenosis. Aortic Valve: The aortic valve is normal in structure. Aortic valve  regurgitation is not visualized. Mild aortic valve sclerosis is present, with no evidence of aortic valve stenosis. Aortic valve mean gradient measures 3.0 mmHg. Aortic valve peak gradient measures 7.3 mmHg. Pulmonic Valve: The pulmonic valve was normal in structure. Pulmonic valve regurgitation is not visualized. No evidence of pulmonic stenosis. Aorta: The aortic root is normal in size and structure. Venous: The inferior vena cava is normal in size with greater than 50% respiratory variability, suggesting right atrial pressure of 3 mmHg. IAS/Shunts: No atrial level shunt detected by color flow Doppler.   LV Volumes (MOD) LV vol d, MOD A4C: 91.4 ml Diastology LV vol s, MOD A4C: 26.8 ml LV e' medial:    8.81 cm/s LV SV MOD A4C:     91.4 ml LV E/e' medial:  7.1                            LV e' lateral:   12.40 cm/s  LV E/e' lateral: 5.0  AORTIC VALVE AV Vmax:           135.00 cm/s AV Vmean:          84.100 cm/s AV VTI:            0.227 m AV Peak Grad:      7.3 mmHg AV Mean Grad:      3.0 mmHg LVOT Vmax:         84.20 cm/s LVOT Vmean:        56.100 cm/s LVOT VTI:          0.108 m LVOT/AV VTI ratio: 0.48 MITRAL VALVE MV Area (PHT): 4.49 cm    SHUNTS MV Peak grad:  6.0 mmHg    Systemic VTI: 0.11 m MV Mean grad:  2.0 mmHg MV Vmax:       1.22 m/s MV Vmean:      64.5 cm/s MV Decel Time: 169 msec MV E velocity: 62.60 cm/s MV A velocity: 85.30 cm/s MV E/A ratio:  0.73 Neoma Laming MD Electronically signed by Neoma Laming MD Signature Date/Time: 02/14/2020/4:30:36 PM    Final    CT Renal Stone Study  Result Date: 02/13/2020 CLINICAL DATA:  Prostate cancer. Pink colored urine. Hypotension. Flank pain. EXAM: CT ABDOMEN AND PELVIS WITHOUT CONTRAST TECHNIQUE: Multidetector CT imaging of the abdomen and pelvis was performed following the standard protocol without IV contrast. COMPARISON:  12/23/2017 CT abdomen/pelvis. FINDINGS: Lower chest: Numerous (greater than 20) solid pulmonary nodules and  masses at both lung bases, largest 5.2 cm in the basilar right lower lobe (series 4/image 27) and 5.5 cm in the basilar left lower lobe (series 4/image 27), all new. Atherosclerosis. Hepatobiliary: Multiple (at least 5) hypodense liver masses scattered throughout the liver, largest 3.8 cm in the segment 6 right liver lobe (series 2/image 36) and 2.0 cm in the superior right liver (series 2/image 22), all new. Cholelithiasis. No gallbladder wall thickening or pericholecystic fluid. No biliary ductal dilatation. Pancreas: Normal, with no mass or duct dilation. Spleen: Normal size. No mass. Adrenals/Urinary Tract: Normal right adrenal. Left adrenal 1.5 cm nodule with density 17 HU, stable, most compatible with an adenoma. Moderate bilateral hydroureteronephrosis to the level of the ureterovesical junctions bilaterally. No renal stones. No ureteral stones. Simple 3.7 cm posterior lower left renal cyst. No additional contour deforming renal lesions. Multiple layering stones in distended bladder, largest 19 mm. Chronic mild diffuse bladder wall thickening is unchanged. Foley catheter in place with balloon in the dependent bladder. Stomach/Bowel: Normal non-distended stomach. Normal caliber small bowel with no small bowel wall thickening. Normal appendix. Normal large bowel with no diverticulosis, large bowel wall thickening or pericolonic fat stranding. Vascular/Lymphatic: Atherosclerotic nonaneurysmal abdominal aorta. No pathologically enlarged lymph nodes in the abdomen or pelvis. Reproductive: Newly mildly enlarged, irregular and poorly marginated prostate, directly contiguous with the bladder trigone. Fiducial markers noted in the prostate as before. Other: No pneumoperitoneum, ascites or focal fluid collection. Solid subcutaneous 2.9 cm lateral right chest wall mass (series 2/image 29), new. Similar solid subcutaneous 4.4 cm left gluteal mass (series 2/image 63). Small fat containing umbilical hernia is stable.  Musculoskeletal: Numerous new sclerotic osseous lesions throughout the visualized skeleton including multiple lower ribs bilaterally, multiple thoracolumbar vertebrae largest at T11 and throughout the bilateral pelvic girdle, most prominent in the left iliac crest (series 2/image 60). Marked lumbar spondylosis. IMPRESSION: 1. Numerous new solid pulmonary nodules and masses at both lung bases, largest 5.2 cm in the basilar right  lower lobe, compatible with pulmonary metastases. 2. New widespread sclerotic osseous metastases throughout the visualized skeleton as detailed. 3. Multiple new hypodense liver masses compatible with liver metastases. 4. Newly mildly enlarged, irregular and poorly marginated prostate, directly contiguous with the bladder trigone, suggestive of recurrent locally advanced prostate cancer. 5. Moderate bilateral hydroureteronephrosis to the level of the UVJ bilaterally, probably due to bladder outlet obstruction despite the well-positioned Foley catheter in the bladder. 6. Multiple layering bladder stones in the distended bladder. 7. Cholelithiasis. 8. Stable left adrenal adenoma. 9. Aortic Atherosclerosis (ICD10-I70.0). Electronically Signed   By: Ilona Sorrel M.D.   On: 02/13/2020 10:06   US Abdomen Limited RUQ  Result Date: 02/17/2020 CLINICAL DATA:  74 year old with history of prostate cancer. Suspicious liver lesions on noncontrast CT. EXAM: ULTRASOUND ABDOMEN LIMITED RIGHT UPPER QUADRANT COMPARISON:  CT renal stone protocol 02/13/2020 FINDINGS: Gallbladder: Echogenic stone at the base of the gallbladder measuring up to 1.0 cm. Common bile duct: Diameter: 0.5 cm Liver: Liver parenchyma is mildly heterogeneous. There are scattered hypoechoic liver lesions that correspond with the previous CT findings. Round hypoechoic lesion in the right hepatic lobe measures up to 2.5 cm. Round hypoechoic lesion along the inferior margin of the right hepatic lobe measures up to 2.3 cm. Large hypoechoic  lesion along the posterior aspect of the liver measures up to 4.2 cm. Portal vein is patent on color Doppler imaging with normal direction of blood flow towards the liver. Other: Partially visualized right kidney demonstrates dilatation of the right renal collecting system and similar to the recent CT. IMPRESSION: 1. Hypoechoic liver lesions. Findings are suggestive for metastatic disease. Patient is scheduled for ultrasound-guided core biopsy of a hepatic lesion. 2. Cholelithiasis. No evidence for gallbladder inflammation or biliary dilatation. Electronically Signed   By: Markus Daft M.D.   On: 02/17/2020 09:33    Assessment and plan-   #Castration resistant metastatic prostate cancer CT images are consistent with rapid progression of the metastatic prostate cancer. PSA was not much elevated, at 6. Ultrasound-guided liver biopsy result showed metastatic high-grade carcinoma. Discussed with pathologist, carcinoma was negative for PSA, the IHC profile excludes urothelial carcinoma. Pathologist feels that it is possible that the PSA expression can be lost in metastatic prostate carcinoma transforming to large cell/neuroendocrine features.  Additional IHC staining is pending. I discussed with the patient and wife, also talked to her daughter Davidian over the phone.  His condition is not curable.  Stage IV, overall he is prognosis is poor If the final pathology is confirmed to be prostate cancer transforming to large cell/neuroendocrine carcinoma.  The treatment will be aggressive chemotherapy, and I am concerned that patient is not going to tolerate well given his current poor performance status.  If he is able to improve and be stronger, we may consider outpatient dose reduced palliative chemotherapy.  If he further deteriorated from this point, I think comfort care/hospice is reasonable choice.  palliative care service has been consulted and CODE STATUS has been changed to DNR/DNI.  #New onset of atrial  fibrillation,Continue Eliquis #Acute on chronic CKD, Creatinine has slightly improved.  Urology recommend continue irrigation to keep catheter draining with plans of possible bilateral nephrostomy tube placement tomorrow.  #Acute respiratory failure, multifactorial, lung metastasis, possible volume overload, continue nasal cannula oxygen. #Anemia, hemoglobin 10.8, likely secondary to chronic hematuria.  Monitor hemoglobin closely.  Transfuse PRBC to keep hemoglobin above 7.  Continue oral iron supplementation.  If continues to decrease, will recommend IV Venofer #History  of seizure, continue home dose of Keppra.  Seizure precautions.  Avoid tramadol   Thank you for allowing me to participate in the care of this patient.   Earlie Server, MD, PhD Hematology Oncology [facility] at [facility] Pager- [phone] 02/21/2020

## 2020-02-21 NOTE — Progress Notes (Signed)
   02/21/20 0900  Assess: MEWS Score  Temp 97.6 F (36.4 C)  BP 110/66  Pulse Rate 64  ECG Heart Rate 64  Resp (!) 27  SpO2 95 %  O2 Device Nasal Cannula  O2 Flow Rate (L/min) 4 L/min  Assess: MEWS Score  MEWS Temp 0  MEWS Systolic 0  MEWS Pulse 0  MEWS RR 2  MEWS LOC 0  MEWS Score 2  MEWS Score Color Yellow  Assess: if the MEWS score is Yellow or Red  Were vital signs taken at a resting state? Yes  Focused Assessment No change from prior assessment  Early Detection of Sepsis Score *See Row Information* Low  MEWS guidelines implemented *See Row Information* No, previously yellow, continue vital signs every 4 hours  Document  Patient Outcome Other (Comment) (continue to monitor )  Progress note created (see row info) Yes

## 2020-02-21 NOTE — Consult Note (Signed)
ANTICOAGULATION CONSULT NOTE - Initial Consult  Pharmacy Consult for Heparin Indication: pulmonary embolus  Allergies  Allergen Reactions  . Poison Oak Extract Rash  . Poison Ivy Extract Rash    Patient Measurements: Height: 6\' 1"  (185.4 cm) Weight: 117 kg (257 lb 14.4 oz) IBW/kg (Calculated) : 79.9 Heparin Dosing Weight: 106.1 kg  Vital Signs: Temp: 97.7 F (36.5 C) (10/19 0445) Temp Source: Oral (10/19 0445) BP: 111/59 (10/19 0635) Pulse Rate: 62 (10/19 0635)  Labs: Recent Labs    02/19/20 0319 02/19/20 0319 02/20/20 0257 02/20/20 2128 02/21/20 0422  HGB 10.1*   < > 10.6*  --  9.4*  HCT 30.4*  --  32.1*  --  29.0*  PLT 275  --  304  --  329  APTT  --   --   --  58* >160*  HEPARINUNFRC  --   --   --  >3.60* >3.60*  CREATININE 3.45*  --  4.06*  --  2.98*   < > = values in this interval not displayed.    Estimated Creatinine Clearance: 29.6 mL/min (A) (by C-G formula based on SCr of 2.98 mg/dL (H)).   Medical History: Past Medical History:  Diagnosis Date  . Anxiety   . Arthritis    lower back, shoulders  . AVM (arteriovenous malformation) brain   . Cancer [facility])    prostate- treated with radiation  . CVA (cerebral infarction) 11/2015  . Depression   . Diabetes mellitus without complication ([place])   . Dyspnea    with exertion  . HOH (hard of hearing)   . Hyperlipidemia   . Hypertension   . Pneumonia    1st grade  . Pre-diabetes   . Prediabetes   . Seizures ([place]) 12/2015  . Sleep apnea   . Stroke [facility])    speech impairment, no weakness    Medications:  Medications Prior to Admission  Medication Sig Dispense Refill Last Dose  . amLODipine (NORVASC) 10 MG tablet Take 10 mg by mouth daily.     Marland Kitchen levETIRAcetam (ROWEEPRA) 500 MG tablet Take 500 mg by mouth 2 (two) times daily.     Marland Kitchen losartan-hydrochlorothiazide (HYZAAR) 100-25 MG tablet Take 1 tablet by mouth daily.  1   . metFORMIN (GLUCOPHAGE-XR) 500 MG 24 hr tablet Take 500 mg by mouth 2 (two)  times daily.     . metoprolol tartrate (LOPRESSOR) 100 MG tablet Take 100 mg by mouth 2 (two) times daily.     . NUBEQA 300 MG tablet TAKE 2 TABLETS (600MG ) BY MOUTH TWO TIMES DAILY WITH A MEAL. (Patient taking differently: Take 600 mg by mouth in the morning and at bedtime. ) 120 tablet 3   . oxyCODONE-acetaminophen (PERCOCET/ROXICET) 5-325 MG tablet Take 1 tablet by mouth every 8 (eight) hours as needed for severe pain. 15 tablet 0   . rosuvastatin (CRESTOR) 5 MG tablet Take 5 mg by mouth at bedtime.      Marland Kitchen spironolactone (ALDACTONE) 100 MG tablet Take 100 mg by mouth 2 (two) times daily.     Marland Kitchen acetaminophen (TYLENOL) 500 MG tablet Take 500 mg by mouth every 6 (six) hours as needed (pain.).    PRN at PRN  . tamsulosin (FLOMAX) 0.4 MG CAPS capsule TAKE ONE CAPSULE BY MOUTH ONCE DAILY (Patient not taking: Reported on 02/13/2020) 30 capsule 3 Not Taking at Unknown time   Scheduled:  . amiodarone  400 mg Oral BID  . Chlorhexidine Gluconate Cloth  6 each Topical  Daily  . ferrous sulfate  325 mg Oral BID WC  . levETIRAcetam  500 mg Oral BID  . metoprolol tartrate  50 mg Oral BID  . rosuvastatin  5 mg Oral QHS  . senna-docusate  2 tablet Oral QHS   Infusions:  . heparin 2,000 Units/hr (02/21/20 0400)   PRN: acetaminophen, acetaminophen-codeine, hydrALAZINE, LORazepam, LORazepam, simethicone, sodium chloride flush  Assessment: 74 yo male who presented to ED with hematuria and lightheadedness. Found to have UTI and pulmonary embolism and later developed Afib with RVR. Baseline labs noted. No anticoagulants PTA.  Pharmacy has been consulted for heparin dosing and monitoring for PE.  Pt started on apixaban. Switching back to heparin due to perc tube placement.   Last dose apixaban ~0900.   Goal of Therapy:  APTT 66-102 seconds until heparin level and aPTT correlate then switch to heparin level (Heparin level 0.3-0.7 units/ml) Monitor platelets by anticoagulation protocol: Yes   Plan:  Start  heparin infusion at 2000 units/hr as patient was stable on this regimen previously.  Check aPTT level in 8 hours and daily while on heparin. CBC with AM labs.  Continue to monitor H&H and platelets  10/19 @ 0422 :  HL = 3.6,  APTT = 160 Will hold heparin for 1 hr and restart at 1700 units/hr.  Will recheck aPTT 8 hrs after rate change.     Ameshia Pewitt D, PharmD, 02/21/2020,7:13 AM

## 2020-02-21 NOTE — Progress Notes (Signed)
PROGRESS NOTE    VERGIL Maddox  WFU:[id number] DOB: 07-18-1945 DOA: 02/13/2020 PCP: [name], MD   Brief Narrative: Taken from H&P Timothy Maddox is a 74 y.o. 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  Pt has a h/o prostate CA, urethral stricture. He is s/p of recent cystoscopy and foley placement by Dr. [name] on 02/07/20. On arrival he was hypotensive which improved with IV fluids, UA looks infected, COVID-19 negative, worsening renal function.  CT renal stone study was concerning for multiple mets involving lung, osseous mets and liver.  Moderate bilateral hydronephrosis to the level of UVJ bilaterally and multiple layering bladder stones surrounding the Foley catheter tip. CTA was obtained due to elevated D-dimer and came back positive for PE, without any CT evidence of right heart strain, patient was started on IV heparin. Developed new onset A. fib with RVR-started on Cardizem infusion, overnight converted back to sinus rhythm and Cardizem infusion was discontinued today.  Cardiology was also consulted.  Subjective: Pt reported intermittent dyspnea.  Family reported pt coughing more.  Still having bloody urine.  Palliative provider discussed with pt and family, and decision was made for comfort care and discharge to hospice facility.   Assessment & Plan:   Principal Problem:   Urinary tract infection associated with indwelling urethral catheter (HCC) Active Problems:   Stroke ([place])   Seizures ([place])   Prostate cancer (HCC)   Hematuria   Hypotension   Hypertension   Type II diabetes mellitus with renal manifestations (HCC)   AKI (acute kidney injury) ([place])   Chronic diastolic CHF (congestive heart failure) (HCC)   Atrial fibrillation with RVR (HCC)   PE (pulmonary thromboembolism) (HCC)   Liver mass   Palliative  care encounter   Iron deficiency anemia due to chronic blood loss   Acute hypoxemic respiratory failure ([place])  Comfort care status and measures --Palliative provider discussed with pt and family, and decision was made for comfort care on 10/19 --discharge to hospice facility --morphine and ativan ordered  Acute hypoxic respiratory failure --While working with PT to ambulate, O2 sat noted to drop to 84% on RA. --CXR 10/16 no acute finding except for low lung volumes and known mets. --Trial Lasix 40 mg x1  --CXR 10/18 again showed no acute process.  Respiratory decompensation likely due to cancer mets in lungs and PE. PLAN: --cont suppl O2 for comfort  Acute urinary retention while on Foley --Foley clogged, new Foley placed on 10/17 with return of 1300 ml urine.  Next day, Foley clogged again.  After irrigation, 1869ml of dark bloody urine with clots drained. --Urology eval 10/18, can not upsize Foley due to stricture PLAN: --order Foley irrigation q4h to drain urine to maintain comfort  Leukocytosis --WBC has been trending up.  No fever. --no further workup since pt is now comfort care.  Complicated UTI/hydronephrosis/multiple bladder stones.  Recent cystoscopy.  Blood cultures negative so far.  Urinary cultures pos for Staphylococcus epidermidis, questionable significance.   --completed a course of ceftriaxone 5 days + Keflex 2 days.  # Penile pain and likely Urethral injury After pulling on Foley catheter.  Pain improved.  Prostate cancer with mets Patient was waiting for PET scan for concern of recurrence of bladder cancer.  CT renal stone study and CTA are concerning for multiple mets including lungs, liver and bones. --  Oncology consulted, Dr. 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 PLAN: --decision made for comfort care.  New onset A. fib with RVR.   Most likely secondary to PE.  Cardiology was consulted and he was started on  Cardizem infusion.  --initiated amiodarone oral loading dose per cardiology.  --HR went up to 110-140's Afib evening 10/13, so started amiodarone gtt, per cardiology rec.  Amiodarone gtt was stopped early this morning because night-team felt pt's HR was already down to 60's.   --another episode of Afib RVR night of 10/15 PLAN: --cont amiodarone oral 400 mg BID --cont metop 50 mg BID --stop anticoagulation due to comfort care status  Acute Pulmonary embolism.   CTA was done due to elevated D-dimer above 4000.  It was positive for PE.  Patient is high risk secondary to extensive malignancy.  Started on heparin infusion and then transitioned to Eliquis PLAN: --stop anticoagulation due to comfort care status  Anemia, iron def Hemoglobin dropped to 10.8 from 12.3.  Most likely secondary to hematuria.  Anemia panel consistent with mild iron deficiency along with anemia of chronic disease.   PLAN: --d/c iron suppl due to comfort care status  History of CVA.   No acute concern. -d/c Lipitor due to comfort care status  History of seizure disorder. -Continue home dose of Keppra. -As needed Ativan  Hypertension.   Blood pressure within goal today. -Continue metoprolol. -Holding home dose of spironolactone and Hyzaar.  AKI, worsened  CKD stage IIIa.   Bilateral hydronephrosis, POA Baseline creatinine around 1.2-1.6.  Stable today around 1.73.  Patient appears dehydrated and hypotensive on admission, s/p MIVF --Cr worsened to 3.45 and then 4.06 this morning, pt found to have urinary retention while on Foley --renal US showed persistent bilateral hydronephrosis PLAN: --Ensure Foley drainage  Chronic diastolic heart failure.  Prior echo done in 2017 with normal EF and grade 1 diastolic dysfunction.  Patient appears euvolemic. PLAN: -Holding home dose of spironolactone. -Continue metoprolol  Type II diabetes mellitus with renal manifestations [facility]): Recent A1c 6.3, well controlled.   Patient taking Metformin at home. --BG has been within goal for inpatient, and pt hasn't required more than 1 or 2 units of insulin per day --no need for fingersticks  Abdominal distention --KUB showed increased bowel gas, but no stool burden, nor bowel obstruction --simethicone PRN   Objective: Vitals:   02/21/20 0635 02/21/20 0900 02/21/20 1140 02/21/20 1620  BP: (!) 111/59 110/66 124/67 134/72  Pulse: 62 64 70 78  Resp: (!) 27 (!) 27 (!) 31 (!) 30  Temp:  97.6 F (36.4 C) (!) 97.5 F (36.4 C) 97.7 F (36.5 C)  TempSrc:  Oral Oral Oral  SpO2: 94% 95% 95% 96%  Weight:      Height:        Intake/Output Summary (Last 24 hours) at 02/21/2020 1757 Last data filed at 02/21/2020 1657 Gross per 24 hour  Intake 1054.05 ml  Output 3125 ml  Net -2070.95 ml   Filed Weights   02/19/20 0526 02/20/20 0409 02/21/20 0445  Weight: 120.4 kg 118.3 kg 117 kg    Examination: Constitutional: NAD, AAOx3 HEENT: conjunctivae and lids normal, EOMI CV: No cyanosis.   RESP: diffuse rhonchi, shallow breaths, on 4L Extremities: No effusions, edema in BLE SKIN: warm, dry and intact Neuro: II - XII grossly intact.   Psych: subdued mood and affect.    Foley cath present, bloody urine   DVT prophylaxis: none due to comfort  care Code Status: DNR Family Communication: wife updated at bedside today Disposition Plan:  Status is: Inpatient  The patient is from: home Anticipated d/c is to: hospice facility Anticipated d/c date is: whenever bed available Patient currently is medically stable to d/c.   Consultants:   Urology  Cardiology  Procedures:  Antimicrobials:  Ceftriaxone  Data Reviewed: I have personally reviewed following labs and imaging studies  CBC: Recent Labs  Lab 02/17/20 0350 02/18/20 0435 02/19/20 0319 02/20/20 0257 02/21/20 0422  WBC 10.3 11.4* 15.0* 18.2* 15.4*  HGB 10.0* 9.4* 10.1* 10.6* 9.4*  HCT 29.2* 28.6* 30.4* 32.1* 29.0*  MCV 88.5 89.9 88.9 90.9  90.6  PLT 292 230 275 304 166   Basic Metabolic Panel: Recent Labs  Lab 02/17/20 0350 02/17/20 0350 02/17/20 2333 02/18/20 0435 02/19/20 0319 02/20/20 0257 02/21/20 0422  NA 137  --   --  133* 135 135 136  K 4.0   < > 4.0 4.0 5.0 5.1 5.0  CL 106  --   --  103 104 102 105  CO2 20*  --   --  19* 16* 17* 18*  GLUCOSE 140*  --   --  158* 138* 136* 135*  BUN 40*  --   --  39* 60* 78* 88*  CREATININE 1.93*  --   --  1.72* 3.45* 4.06* 2.98*  CALCIUM 9.1  --   --  9.0 9.1 9.4 9.1  MG 2.2   < > 1.9 2.5* 2.6* 2.7* 2.7*   < > = values in this interval not displayed.   GFR: Estimated Creatinine Clearance: 29.6 mL/min (A) (by C-G formula based on SCr of 2.98 mg/dL (H)). Liver Function Tests: No results for input(s): AST, ALT, ALKPHOS, BILITOT, PROT, ALBUMIN in the last 168 hours. No results for input(s): LIPASE, AMYLASE in the last 168 hours. No results for input(s): AMMONIA in the last 168 hours. Coagulation Profile: Recent Labs  Lab 02/17/20 0350  INR 1.2   Cardiac Enzymes: No results for input(s): CKTOTAL, CKMB, CKMBINDEX, TROPONINI in the last 168 hours. BNP (last 3 results) No results for input(s): PROBNP in the last 8760 hours. HbA1C: No results for input(s): HGBA1C in the last 72 hours. CBG: Recent Labs  Lab 02/15/20 0803 02/15/20 1139 02/15/20 1648 02/15/20 2053 02/20/20 2053  GLUCAP 119* 145* 130* 163* 139*   Lipid Profile: No results for input(s): CHOL, HDL, LDLCALC, TRIG, CHOLHDL, LDLDIRECT in the last 72 hours. Thyroid Function Tests: No results for input(s): TSH, T4TOTAL, FREET4, T3FREE, THYROIDAB in the last 72 hours. Anemia Panel: No results for input(s): VITAMINB12, FOLATE, FERRITIN, TIBC, IRON, RETICCTPCT in the last 72 hours. Sepsis Labs: Recent Labs  Lab 02/19/20 0319  PROCALCITON 0.75    Recent Results (from the past 240 hour(s))  Urine culture     Status: Abnormal   Collection Time: 02/13/20  8:46 AM   Specimen: Urine, Catheterized  Result  Value Ref Range Status   Specimen Description   Final    URINE, CATHETERIZED Performed at [facility], [address], [place], [place] [address]    Special Requests   Final    NONE Performed at [facility], [place], [place] [address]    Culture 50,000 COLONIES/mL STAPHYLOCOCCUS EPIDERMIDIS (A)  Final   Report Status 02/15/2020 FINAL  Final   Organism ID, Bacteria STAPHYLOCOCCUS EPIDERMIDIS (A)  Final      Susceptibility   Staphylococcus epidermidis - MIC*    CIPROFLOXACIN >=8  RESISTANT Resistant     GENTAMICIN <=0.5 SENSITIVE Sensitive     NITROFURANTOIN <=16 SENSITIVE Sensitive     OXACILLIN <=0.25 SENSITIVE Sensitive     TETRACYCLINE 2 SENSITIVE Sensitive     VANCOMYCIN 1 SENSITIVE Sensitive     TRIMETH/SULFA >=320 RESISTANT Resistant     CLINDAMYCIN >=8 RESISTANT Resistant     RIFAMPIN <=0.5 SENSITIVE Sensitive     Inducible Clindamycin NEGATIVE Sensitive     * 50,000 COLONIES/mL STAPHYLOCOCCUS EPIDERMIDIS  Resp Panel by RT PCR (RSV, Flu A&B, Covid) - Nasopharyngeal Swab     Status: None   Collection Time: 02/13/20  9:59 AM   Specimen: Nasopharyngeal Swab  Result Value Ref Range Status   SARS Coronavirus 2 by RT PCR NEGATIVE NEGATIVE Final    Comment: (NOTE) SARS-CoV-2 target nucleic acids are NOT DETECTED.  The SARS-CoV-2 RNA is generally detectable in upper respiratoy specimens during the acute phase of infection. The lowest concentration of SARS-CoV-2 viral copies this assay can detect is 131 copies/mL. A negative result does not preclude SARS-Cov-2 infection and should not be used as the sole basis for treatment or other patient management decisions. A negative result may occur with  improper specimen collection/handling, submission of specimen other than nasopharyngeal swab, presence of viral mutation(s) within the areas targeted by this assay, and inadequate number of viral copies (<131 copies/mL). A negative result must be  combined with clinical observations, patient history, and epidemiological information. The expected result is Negative.  Fact Sheet for Patients:  PinkCheek.be  Fact Sheet for Healthcare Providers:  GravelBags.it  This test is no t yet approved or cleared by the [place] FDA and  has been authorized for detection and/or diagnosis of SARS-CoV-2 by FDA under an Emergency Use Authorization (EUA). This EUA will remain  in effect (meaning this test can be used) for the duration of the COVID-19 declaration under Section 564(b)(1) of the Act, 21 U.S.C. section 360bbb-3(b)(1), unless the authorization is terminated or revoked sooner.     Influenza A by PCR NEGATIVE NEGATIVE Final   Influenza B by PCR NEGATIVE NEGATIVE Final    Comment: (NOTE) The Xpert Xpress SARS-CoV-2/FLU/RSV assay is intended as an aid in  the diagnosis of influenza from Nasopharyngeal swab specimens and  should not be used as a sole basis for treatment. Nasal washings and  aspirates are unacceptable for Xpert Xpress SARS-CoV-2/FLU/RSV  testing.  Fact Sheet for Patients: PinkCheek.be  Fact Sheet for Healthcare Providers: GravelBags.it  This test is not yet approved or cleared by the [place] FDA and  has been authorized for detection and/or diagnosis of SARS-CoV-2 by  FDA under an Emergency Use Authorization (EUA). This EUA will remain  in effect (meaning this test can be used) for the duration of the  Covid-19 declaration under Section 564(b)(1) of the Act, 21  U.S.C. section 360bbb-3(b)(1), unless the authorization is  terminated or revoked.    Respiratory Syncytial Virus by PCR NEGATIVE NEGATIVE Final    Comment: (NOTE) Fact Sheet for Patients: PinkCheek.be  Fact Sheet for Healthcare Providers: GravelBags.it  This test is  not yet approved or cleared by the [place] FDA and  has been authorized for detection and/or diagnosis of SARS-CoV-2 by  FDA under an Emergency Use Authorization (EUA). This EUA will remain  in effect (meaning this test can be used) for the duration of the  COVID-19 declaration under Section 564(b)(1) of the Act, 21 U.S.C.  section 360bbb-3(b)(1), unless the authorization is terminated or  revoked. Performed at [facility], [address] [place], [place] [address]   Culture, blood (Routine X 2) w Reflex to ID Panel     Status: None   Collection Time: 02/13/20 10:22 AM   Specimen: BLOOD  Result Value Ref Range Status   Specimen Description BLOOD LEFT HAND  Final   Special Requests   Final    BOTTLES DRAWN AEROBIC AND ANAEROBIC Blood Culture results may not be optimal due to an inadequate volume of blood received in culture bottles   Culture   Final    NO GROWTH 5 DAYS Performed at [facility], [address] [place], [place] [address]    Report Status 02/18/2020 FINAL  Final  Culture, blood (Routine X 2) w Reflex to ID Panel     Status: None   Collection Time: 02/13/20 10:30 AM   Specimen: BLOOD  Result Value Ref Range Status   Specimen Description BLOOD LEFT ARM  Final   Special Requests   Final    BOTTLES DRAWN AEROBIC AND ANAEROBIC Blood Culture adequate volume   Culture   Final    NO GROWTH 5 DAYS Performed at [facility], [address], [place], [place] [address]    Report Status 02/18/2020 FINAL  Final     Radiology Studies: CT PELVIS WO CONTRAST  Result Date: 02/20/2020 CLINICAL DATA:  Urinary retention, prostate cancer, urethral stricture EXAM: CT PELVIS WITHOUT CONTRAST TECHNIQUE: Multidetector CT imaging of the pelvis was performed following the standard protocol without intravenous contrast. COMPARISON:  02/13/20. FINDINGS: Urinary Tract: Foley catheter balloon is again seen within the bladder lumen. Despite this, the  bladder appears mildly distended, raising the question of obstruction of the catheter. There are numerous layering calcifications within the bladder lumen, slightly improved from prior examination but persistent. Additionally, a small amount of calcific debris is seen within the Foley catheter lumen suggesting partial obstruction secondary to debris. Gas is seen within the bladder lumen related to probable catheterization. There is mild perivesicular inflammatory stranding, similar to prior examination, likely infectious or inflammatory in nature. There is again noted mild right and moderate left hydroureter which appears similar to that seen on prior examination suggesting obstruction of the ureters bilaterally at the ureterovesicular junctions. Locally invasive prostatic mass with invasion of the bladder trigone is again noted. Brachytherapy seeds are seen within the prostate gland. Bowel: Small broad-based umbilical hernia is again identified containing a single wall of the otherwise unremarkable mid transverse colon. The visualized large and small bowel are otherwise unremarkable. Appendix normal. No free fluid within the pelvis. Vascular/Lymphatic: Extensive aortoiliac atherosclerotic calcification. No aortic aneurysm. No pathologic adenopathy within the pelvis. Reproductive: Microlobulated prostatic mass invades the base of the bladder as described above. This is similar in appearance to prior examination. Other: Small fat containing left inguinal hernia noted. Rectum unremarkable. Musculoskeletal: Sclerotic metastases are again identified within the visualized axial skeleton in keeping with osseous metastatic disease. Similarly, 4.7 cm soft tissue mass is seen within the subcutaneous fat of the left gluteal region in keeping with a soft tissue metastasis. IMPRESSION: Stable examination. Prostatic mass invades the base of the bladder with resultant obstruction of the ureterovesicular junctions bilaterally and  mild-to-moderate hydroureter noted bilaterally. Foley catheter balloon remains within the bladder lumen, however, mild bladder distension persists and calcific debris within the catheter lumen suggests partial obstruction due to debris. Layering bladder calculi again noted, slightly improved from prior examination possibly related to fragmentation. Perivesicular inflammatory stranding suggesting  persistent infectious or inflammatory cystitis. Sclerotic osseous and subcutaneous soft tissue metastatic disease again identified. Aortic Atherosclerosis (ICD10-I70.0). Electronically Signed   By: Fidela Salisbury MD   On: 02/20/2020 16:05   US RENAL  Result Date: 02/19/2020 CLINICAL DATA:  Acute renal injury EXAM: RENAL / URINARY TRACT ULTRASOUND COMPLETE COMPARISON:  CT from 02/13/2020 FINDINGS: Right Kidney: Renal measurements: 13.0 x 6.8 x 5.7 cm. = volume: 266 mL. Mild to moderate hydronephrosis is noted. Left Kidney: Renal measurements: 14.8 x 7.1 x 6.1 cm. = volume: 333 mL. Moderate to severe hydronephrosis is noted on the left. Additionally there is a 3.9 cm cystic lesion in the lower pole which appears simple in nature. Bladder: Decompressed by Foley catheter. Multiple bladder calculi are again identified and stable. Other: None. IMPRESSION: Stable appearing hydronephrosis bilaterally worse on the left than the right this is stable from prior CT examination. Left renal cyst. Multiple bladder calculi. Electronically Signed   By: Inez Catalina M.D.   On: 02/19/2020 20:12   DG Chest Port 1 View  Result Date: 02/20/2020 CLINICAL DATA:  Dyspnea EXAM: PORTABLE CHEST 1 VIEW COMPARISON:  02/18/2020, CT 02/13/2020 FINDINGS: Multiple bilateral lung nodules. No acute consolidation or effusion. Enlarged cardiomediastinal silhouette. Bilateral hilar enlargement likely due to nodes. No pneumothorax. IMPRESSION: 1. No acute airspace disease. 2. Bilateral pulmonary nodules/masses and hilar enlargement, consistent with  metastatic disease. Electronically Signed   By: Donavan Foil M.D.   On: 02/20/2020 18:12    Scheduled Meds: . amiodarone  200 mg Oral BID  . Chlorhexidine Gluconate Cloth  6 each Topical Daily  . levETIRAcetam  500 mg Oral BID  . metoprolol tartrate  50 mg Oral BID  . senna-docusate  2 tablet Oral QHS   Continuous Infusions:    LOS: 8 days    Enzo Bi, MD [facility]  If 7PM-7AM, please contact night-coverage [email or web address]  02/21/2020, 5:57 PM

## 2020-02-21 NOTE — Progress Notes (Signed)
[facility]) Hospital Liaison RN note:  Received request from Altha Harm, NP for family interest in [facility]. Chart reviewed and eligibility is pending. Spoke with patient and family in room to confirm interest, initiate education related to hospice philosphy and explain services. (Spouse-Glenda and daughter-Tonya)  Unfortunately, [facility] does not have a bed to offer today. Family and hospital care team is aware. [facility] Liaison will continue to follow for room availability.  Please call with any hospice related questions or concerns.  Thank you for the opportunity to participate in this patient's care.  Zandra Abts, RN [facility] Liaison [phone]

## 2020-02-21 NOTE — Progress Notes (Signed)
   02/21/20 0445  Assess: MEWS Score  Temp 97.7 F (36.5 C)  BP 115/66  Pulse Rate 69  ECG Heart Rate 69  Resp (!) 25  SpO2 92 %  O2 Device Nasal Cannula  Patient Activity (if Appropriate) In bed  O2 Flow Rate (L/min) 4 L/min  Assess: MEWS Score  MEWS Temp 0  MEWS Systolic 0  MEWS Pulse 0  MEWS RR 1  MEWS LOC 0  MEWS Score 1  MEWS Score Color Green   Patient improved with resp rate; other VSS. Resting comfortably. Will CTM

## 2020-02-21 NOTE — Progress Notes (Signed)
SUBJECTIVE: Patient resting comfortably in bed. Denies chest pain or shortness of breath. Continues to require supplemental oxygen. No acute events overnight.   Vitals:   02/21/20 0400 02/21/20 0445 02/21/20 0635 02/21/20 0900  BP: (!) 93/55 115/66 (!) 111/59 110/66  Pulse: 72 69 62 64  Resp: (!) 22 (!) 25 (!) 27 (!) 27  Temp:  97.7 F (36.5 C)  97.6 F (36.4 C)  TempSrc:  Oral  Oral  SpO2: 91% 92% 94% 95%  Weight:  117 kg    Height:        Intake/Output Summary (Last 24 hours) at 02/21/2020 0921 Last data filed at 02/21/2020 0745 Gross per 24 hour  Intake 600 ml  Output 2525 ml  Net -1925 ml    LABS: Basic Metabolic Panel: Recent Labs    02/20/20 0257 02/21/20 0422  NA 135 136  K 5.1 5.0  CL 102 105  CO2 17* 18*  GLUCOSE 136* 135*  BUN 78* 88*  CREATININE 4.06* 2.98*  CALCIUM 9.4 9.1  MG 2.7* 2.7*   Liver Function Tests: No results for input(s): AST, ALT, ALKPHOS, BILITOT, PROT, ALBUMIN in the last 72 hours. No results for input(s): LIPASE, AMYLASE in the last 72 hours. CBC: Recent Labs    02/20/20 0257 02/21/20 0422  WBC 18.2* 15.4*  HGB 10.6* 9.4*  HCT 32.1* 29.0*  MCV 90.9 90.6  PLT 304 329   Cardiac Enzymes: No results for input(s): CKTOTAL, CKMB, CKMBINDEX, TROPONINI in the last 72 hours. BNP: Invalid input(s): POCBNP D-Dimer: No results for input(s): DDIMER in the last 72 hours. Hemoglobin A1C: No results for input(s): HGBA1C in the last 72 hours. Fasting Lipid Panel: No results for input(s): CHOL, HDL, LDLCALC, TRIG, CHOLHDL, LDLDIRECT in the last 72 hours. Thyroid Function Tests: No results for input(s): TSH, T4TOTAL, T3FREE, THYROIDAB in the last 72 hours.  Invalid input(s): FREET3 Anemia Panel: No results for input(s): VITAMINB12, FOLATE, FERRITIN, TIBC, IRON, RETICCTPCT in the last 72 hours.   PHYSICAL EXAM General: Well developed, well nourished, in no acute distress HEENT:  Normocephalic and atramatic Neck:  No JVD.  Lungs:  Clear bilaterally to auscultation and percussion. Heart: HRRR . Normal S1 and S2 without gallops or murmurs.  Abdomen: Bowel sounds are positive, abdomen soft and non-tender  Msk:  Generalized weakness. Extremities: No clubbing, cyanosis or edema.   Neuro: Alert and oriented X 3. Psych:  Good affect, responds appropriately  TELEMETRY: NSR. 66/BPM  ASSESSMENT AND PLAN: Patient remains in NSR. Will decrease Amiodarone to 200mg  BID. Continue metoprolol tartrate 50mg  bid. Eliquis switched to heparin drip for possible bilateral PCN tube placement. Kidney function has mildly improved and foley continues to have hematuria but without clots this AM. From a cardiac point of view patient is stable for possible PCN tube placement. Will sign off now as patient has remained in sinus rhythm. Further Amiodarone titration will be completed as an outpatient.   Principal Problem:   Urinary tract infection associated with indwelling urethral catheter ([place]) Active Problems:   Stroke ([place])   Seizures ([place])   Prostate cancer ([facility])   Hypotension   Hypertension   Type II diabetes mellitus with renal manifestations ([facility])   AKI (acute kidney injury) ([place])   Chronic diastolic CHF (congestive heart failure) ([facility])   Atrial fibrillation with RVR ([place])   PE (pulmonary thromboembolism) ([place])   Liver mass   Palliative care encounter    Adaline Sill, NP-C 02/21/2020 9:21 AM

## 2020-02-21 NOTE — Progress Notes (Signed)
PT Cancellation Note  Patient Details Name: Timothy Maddox MRN: [id number] DOB: 02-11-1946   Cancelled Treatment:     PT hold this AM. Will continue to follow and return after palliative care consult to discuss POC going forward.    Willette Pa 02/21/2020, 9:04 AM

## 2020-02-21 NOTE — Progress Notes (Signed)
[place]  Telephone:([phone] [phone]   Name: Timothy Maddox Date: 02/21/2020 MRN: [id number]  DOB: Sep 06, 1945  Patient Care Team: [name], MD as PCP - General (Internal Medicine) Abbie Sons, MD (Urology)    REASON FOR CONSULTATION: Timothy Maddox is a 74 y.o. 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  CTA of the chest on 02/13/2020 revealed acute PE and innumerable bilateral pulmonary masses with bilateral pathologic hilar adenopathy and widespread sclerotic osseous metastases.  Additionally patient had a CT renal study on 02/13/2020 with findings of multiple liver masses and a newly enlarged poorly marginated prostate mass.  Patient's hospitalization has been complicated by A. fib, respiratory failure, and obstructive uropathy despite Foley exchanges.  Palliative care was consulted to help address goals.   CODE STATUS: DNR  PAST MEDICAL HISTORY: Past Medical History:  Diagnosis Date  . Anxiety   . Arthritis    lower back, shoulders  . AVM (arteriovenous malformation) brain   . Cancer [facility])    prostate- treated with radiation  . CVA (cerebral infarction) 11/2015  . Depression   . Diabetes mellitus without complication ([place])   . Dyspnea    with exertion  . HOH (hard of hearing)   . Hyperlipidemia   . Hypertension   . Pneumonia    1st grade  . Pre-diabetes   . Prediabetes   . Seizures ([place]) 12/2015  . Sleep apnea   . Stroke [facility])    speech impairment, no weakness    PAST SURGICAL HISTORY:  Past Surgical History:  Procedure Laterality Date  . BACK SURGERY  1999   disectomy  .  BACK SURGERY     2 surgery- injury  . COLONOSCOPY W/ POLYPECTOMY    . COLONOSCOPY WITH PROPOFOL N/A 02/28/2019   Procedure: COLONOSCOPY WITH PROPOFOL;  Surgeon: Toledo, Benay Pike, MD;  Location: [facility] ENDOSCOPY;  Service: Gastroenterology;  Laterality: N/A;  . CYSTOSCOPY WITH URETHRAL DILATATION Bilateral 02/07/2020   Procedure: CYSTOSCOPY WITH URETHRAL DILATATION;  Surgeon: Abbie Sons, MD;  Location: [facility] ORS;  Service: Urology;  Laterality: Bilateral;  . HAMMER TOE SURGERY Bilateral   . INGUINAL HERNIA REPAIR Right    per patient when he was in 1st grade  . IR GENERIC HISTORICAL  12/05/2015   IR RADIOLOGIST EVAL & MGMT 12/05/2015 [facility]  . IR GENERIC HISTORICAL  01/31/2016   IR RADIOLOGIST EVAL & MGMT 01/31/2016 [facility]  . IR GENERIC HISTORICAL  02/20/2016   IR ANGIO INTRA EXTRACRAN SEL COM CAROTID INNOMINATE UNI L MOD SED 02/20/2016 [name], MD [facility]  . IR GENERIC HISTORICAL  02/20/2016   IR 3D INDEPENDENT WKST 02/20/2016 [name], MD [facility]  . KNEE SURGERY Bilateral 2015  . RADIOLOGY WITH ANESTHESIA N/A 02/20/2016   Procedure: EMBOLIZATION;  Surgeon: [name], MD;  Location: [place];  Service: Radiology;  Laterality: N/A;  . TOE SURGERY Left 2017   TOE NAIL REMOVAL  . TOTAL KNEE ARTHROPLASTY Right 07/20/2017   Procedure: TOTAL KNEE ARTHROPLASTY;  Surgeon: Lovell Sheehan, MD;  Location: [facility] ORS;  Service: Orthopedics;  Laterality: Right;    HEMATOLOGY/ONCOLOGY HISTORY:  Oncology History   No  history exists.    ALLERGIES:  is allergic to poison oak extract and poison ivy extract.  MEDICATIONS:  Current Facility-Administered Medications  Medication Dose Route Frequency Provider Last Rate Last Admin  . acetaminophen (TYLENOL) tablet 650 mg  650 mg Oral Q6H PRN Ivor Costa, MD   650 mg at 02/21/20 0029  . acetaminophen-codeine (TYLENOL #3) 300-30 MG per tablet 1 tablet  1 tablet Oral Q4H PRN Earlie Server, MD   1 tablet at 02/19/20  2331  . amiodarone (PACERONE) tablet 200 mg  200 mg Oral BID Adaline Sill, NP      . Chlorhexidine Gluconate Cloth 2 % PADS 6 each  6 each Topical Daily Ivor Costa, MD   6 each at 02/21/20 1015  . ferrous sulfate tablet 325 mg  325 mg Oral BID WC Earlie Server, MD   325 mg at 02/21/20 0916  . heparin ADULT infusion 100 units/mL (25000 units/266mL sodium chloride 0.45%)  1,700 Units/hr Intravenous Continuous Enzo Bi, MD 17 mL/hr at 02/21/20 1406 1,700 Units/hr at 02/21/20 1406  . hydrALAZINE (APRESOLINE) injection 5 mg  5 mg Intravenous Q2H PRN Ivor Costa, MD      . levETIRAcetam (KEPPRA) tablet 500 mg  500 mg Oral BID Ivor Costa, MD   500 mg at 02/21/20 0916  . LORazepam (ATIVAN) injection 0.5 mg  0.5 mg Intravenous Q12H PRN Ivor Costa, MD   0.5 mg at 02/21/20 0028  . LORazepam (ATIVAN) injection 0.5-1 mg  0.5-1 mg Intravenous Q2H PRN Ivor Costa, MD      . metoprolol tartrate (LOPRESSOR) tablet 50 mg  50 mg Oral BID Ivor Costa, MD   50 mg at 02/20/20 2221  . rosuvastatin (CRESTOR) tablet 5 mg  5 mg Oral QHS Ivor Costa, MD   5 mg at 02/20/20 2221  . senna-docusate (Senokot-S) tablet 2 tablet  2 tablet Oral QHS Enzo Bi, MD   2 tablet at 02/18/20 2129  . simethicone (MYLICON) chewable tablet 80 mg  80 mg Oral QID PRN Enzo Bi, MD   80 mg at 02/18/20 1329  . sodium chloride flush (NS) 0.9 % injection 3 mL  3 mL Intravenous PRN Lorella Nimrod, MD   3 mL at 02/14/20 2314    VITAL SIGNS: BP 124/67 (BP Location: Left Arm)   Pulse 70   Temp (!) 97.5 F (36.4 C) (Oral)   Resp (!) 31   Ht $R'6\' 1"'hW$  (1.854 m)   Wt 257 lb 14.4 oz (117 kg)   SpO2 95%   BMI 34.03 kg/m  Filed Weights   02/19/20 0526 02/20/20 0409 02/21/20 0445  Weight: 265 lb 8 oz (120.4 kg) 260 lb 11.2 oz (118.3 kg) 257 lb 14.4 oz (117 kg)    Estimated body mass index is 34.03 kg/m as calculated from the following:   Height as of this encounter: $RemoveBeforeD'6\' 1"'kpbgofvnQweVSm$  (1.854 m).   Weight as of this encounter: 257 lb 14.4 oz (117  kg).  LABS: CBC:    Component Value Date/Time   WBC 15.4 (H) 02/21/2020 0422   HGB 9.4 (L) 02/21/2020 0422   HGB 14.6 08/27/2014 1410   HCT 29.0 (L) 02/21/2020 0422   HCT 42.8 08/27/2014 1410   PLT 329 02/21/2020 0422   PLT 214 08/27/2014 1410   MCV 90.6 02/21/2020 0422   MCV 86 08/27/2014 1410   NEUTROABS 4.5 01/30/2020 0937   LYMPHSABS 1.0 01/30/2020 0937   MONOABS 0.8 01/30/2020 0937   EOSABS 0.2 01/30/2020 4431  BASOSABS 0.0 01/30/2020 0937   Comprehensive Metabolic Panel:    Component Value Date/Time   NA 136 02/21/2020 0422   NA 140 12/03/2016 1632   NA 141 08/27/2014 1410   K 5.0 02/21/2020 0422   K 3.0 (L) 08/27/2014 1410   CL 105 02/21/2020 0422   CL 102 08/27/2014 1410   CO2 18 (L) 02/21/2020 0422   CO2 32 08/27/2014 1410   BUN 88 (H) 02/21/2020 0422   BUN 18 12/03/2016 1632   BUN 14 08/27/2014 1410   CREATININE 2.98 (H) 02/21/2020 0422   CREATININE 0.95 08/27/2014 1410   GLUCOSE 135 (H) 02/21/2020 0422   GLUCOSE 112 (H) 08/27/2014 1410   CALCIUM 9.1 02/21/2020 0422   CALCIUM 8.9 08/27/2014 1410   AST 18 01/30/2020 0937   ALT 14 01/30/2020 0937   ALKPHOS 53 01/30/2020 0937   BILITOT 0.6 01/30/2020 0937   BILITOT 0.3 12/03/2016 1632   PROT 7.7 01/30/2020 0937   PROT 7.7 12/03/2016 1632   ALBUMIN 3.8 01/30/2020 0937   ALBUMIN 4.5 12/03/2016 1632    RADIOGRAPHIC STUDIES: DG Abd 1 View  Result Date: 02/18/2020 CLINICAL DATA:  Dyspnea and abdominal pain. Per physician notes - States he feels dyspnea with exertion as he recently used the restroom with Physical Therapy. Denies chest pain. Patient also denies any symptoms when he went into A fib with RVR last evening. Hx - prostate cancer, DM, HTN, PNA, former smoker. EXAM: ABDOMEN - 1 VIEW COMPARISON:  CT, 02/13/2020 FINDINGS: Increased bowel gas, but no bowel dilation to suggest obstruction. A catheter projects in the lower pelvis consistent with a urinary catheter. There are 3 radiation therapy markers  projecting in the region of the prostate gland. A calcification is noted in the right upper quadrant consistent with a gallstone as was noted on the recent CT. There are scattered aortic and iliac atherosclerotic vascular calcifications. Soft tissues are poorly defined but otherwise unremarkable. No acute skeletal abnormality. Lung base nodules are incompletely imaged, better noted on the recent prior CT. IMPRESSION: 1. No acute findings.  No evidence of bowel obstruction. Electronically Signed   By: Lajean Manes M.D.   On: 02/18/2020 17:17   CT ANGIO CHEST PE W OR WO CONTRAST  Result Date: 02/13/2020 CLINICAL DATA:  Atrial fibrillation, elevated D-dimer, prostate cancer EXAM: CT ANGIOGRAPHY CHEST WITH CONTRAST TECHNIQUE: Multidetector CT imaging of the chest was performed using the standard protocol during bolus administration of intravenous contrast. Multiplanar CT image reconstructions and MIPs were obtained to evaluate the vascular anatomy. CONTRAST:  57mL OMNIPAQUE IOHEXOL 350 MG/ML SOLN COMPARISON:  None. FINDINGS: Cardiovascular: There is adequate opacification of the pulmonary arterial tree. There are multiple branching central intraluminal filling defects identified within the right upper and right lower lobar pulmonary arteries as well as multiple segmental pulmonary arteries of the left lower lobe in keeping with acute pulmonary embolism. The central pulmonary arteries are enlarged in keeping with changes of pulmonary arterial hypertension. However, there is no CT evidence of right heart strain with the right to left ventricular ratio within normal limits. Moderate coronary artery calcification. Cardiac size is mildly enlarged. Moderate calcification of the mitral valve annulus. No pericardial effusion. The thoracic aorta is of normal caliber. Mild atherosclerotic calcification is seen within the aortic arch and descending thoracic aorta. Mediastinum/Nodes: There is pathologic bilateral hilar  adenopathy. Index lymph node within the right hilum measures 16 mm x 25 mm at axial image # 42/4. Thyroid unremarkable. Esophagus unremarkable. Lungs/Pleura: Multiple  bilateral pulmonary masses are identified most in keeping with pulmonary metastatic disease in this patient with a known history of prostate cancer. Index lesion within the right lower lobe measures 3.5 x 5.2 cm at axial image # 62/6. At least 50 nodules are seen scattered throughout the lungs bilaterally. No pneumothorax or pleural effusion. The central airways are widely patent. Upper Abdomen: Unremarkable Musculoskeletal: Multiple sclerotic metastases are identified within the visualized axial skeleton involving the scapula bilaterally, the sternum, the thoracic spine, and multiple ribs bilaterally. No pathologic fracture. Review of the MIP images confirms the above findings. IMPRESSION: Acute pulmonary embolism. Moderate embolic burden. No CT evidence of right heart strain. Moderate coronary artery calcification.  Mild global cardiomegaly. Innumerable bilateral pulmonary masses and nodules as well as bilateral pathologic hilar adenopathy and widespread sclerotic osseous metastases in keeping with visceral and osseous metastatic disease related to the patient's underlying prostate cancer. Aortic Atherosclerosis (ICD10-I70.0). Electronically Signed   By: Fidela Salisbury MD   On: 02/13/2020 23:27   CT PELVIS WO CONTRAST  Result Date: 02/20/2020 CLINICAL DATA:  Urinary retention, prostate cancer, urethral stricture EXAM: CT PELVIS WITHOUT CONTRAST TECHNIQUE: Multidetector CT imaging of the pelvis was performed following the standard protocol without intravenous contrast. COMPARISON:  02/13/20. FINDINGS: Urinary Tract: Foley catheter balloon is again seen within the bladder lumen. Despite this, the bladder appears mildly distended, raising the question of obstruction of the catheter. There are numerous layering calcifications within the bladder  lumen, slightly improved from prior examination but persistent. Additionally, a small amount of calcific debris is seen within the Foley catheter lumen suggesting partial obstruction secondary to debris. Gas is seen within the bladder lumen related to probable catheterization. There is mild perivesicular inflammatory stranding, similar to prior examination, likely infectious or inflammatory in nature. There is again noted mild right and moderate left hydroureter which appears similar to that seen on prior examination suggesting obstruction of the ureters bilaterally at the ureterovesicular junctions. Locally invasive prostatic mass with invasion of the bladder trigone is again noted. Brachytherapy seeds are seen within the prostate gland. Bowel: Small broad-based umbilical hernia is again identified containing a single wall of the otherwise unremarkable mid transverse colon. The visualized large and small bowel are otherwise unremarkable. Appendix normal. No free fluid within the pelvis. Vascular/Lymphatic: Extensive aortoiliac atherosclerotic calcification. No aortic aneurysm. No pathologic adenopathy within the pelvis. Reproductive: Microlobulated prostatic mass invades the base of the bladder as described above. This is similar in appearance to prior examination. Other: Small fat containing left inguinal hernia noted. Rectum unremarkable. Musculoskeletal: Sclerotic metastases are again identified within the visualized axial skeleton in keeping with osseous metastatic disease. Similarly, 4.7 cm soft tissue mass is seen within the subcutaneous fat of the left gluteal region in keeping with a soft tissue metastasis. IMPRESSION: Stable examination. Prostatic mass invades the base of the bladder with resultant obstruction of the ureterovesicular junctions bilaterally and mild-to-moderate hydroureter noted bilaterally. Foley catheter balloon remains within the bladder lumen, however, mild bladder distension persists  and calcific debris within the catheter lumen suggests partial obstruction due to debris. Layering bladder calculi again noted, slightly improved from prior examination possibly related to fragmentation. Perivesicular inflammatory stranding suggesting persistent infectious or inflammatory cystitis. Sclerotic osseous and subcutaneous soft tissue metastatic disease again identified. Aortic Atherosclerosis (ICD10-I70.0). Electronically Signed   By: Fidela Salisbury MD   On: 02/20/2020 16:05   US RENAL  Result Date: 02/19/2020 CLINICAL DATA:  Acute renal injury EXAM: RENAL / URINARY TRACT ULTRASOUND  COMPLETE COMPARISON:  CT from 02/13/2020 FINDINGS: Right Kidney: Renal measurements: 13.0 x 6.8 x 5.7 cm. = volume: 266 mL. Mild to moderate hydronephrosis is noted. Left Kidney: Renal measurements: 14.8 x 7.1 x 6.1 cm. = volume: 333 mL. Moderate to severe hydronephrosis is noted on the left. Additionally there is a 3.9 cm cystic lesion in the lower pole which appears simple in nature. Bladder: Decompressed by Foley catheter. Multiple bladder calculi are again identified and stable. Other: None. IMPRESSION: Stable appearing hydronephrosis bilaterally worse on the left than the right this is stable from prior CT examination. Left renal cyst. Multiple bladder calculi. Electronically Signed   By: Inez Catalina M.D.   On: 02/19/2020 20:12   US BIOPSY (LIVER)  Result Date: 02/17/2020 INDICATION: 74 year old with history of prostate cancer and evidence for metastatic disease in the chest and liver. Tissue diagnosis is needed. EXAM: ULTRASOUND-GUIDED LIVER LESION BIOPSY MEDICATIONS: None. ANESTHESIA/SEDATION: Moderate (conscious) sedation was employed during this procedure. A total of Versed 1.0 mg and Fentanyl 100 mcg was administered intravenously. Moderate Sedation Time: 16 minutes. The patient's level of consciousness and vital signs were monitored continuously by radiology nursing throughout the procedure under my  direct supervision. FLUOROSCOPY TIME:  None COMPLICATIONS: None immediate. PROCEDURE: Informed written consent was obtained from the patient after a thorough discussion of the procedural risks, benefits and alternatives. All questions were addressed. A timeout was performed prior to the initiation of the procedure. The liver was evaluated with ultrasound. Hypoechoic lesion along the inferior right hepatic lobe was identified and targeted for biopsy. The right side of the abdomen was prepped with chlorhexidine and sterile field was created. Skin and soft tissues were anesthetized using 1% lidocaine. Small incision was made. Using ultrasound guidance, 17 gauge coaxial needle was directed into the right hepatic lobe and into the hypoechoic lesion. Total of 3 core biopsies were obtained with 18 gauge core device. Gel-Foam slurry was injected through the 17 gauge coaxial needle as it was removed. Bandage placed over the puncture site. FINDINGS: Scattered hypoechoic lesions in the liver. Lesion in the inferior right hepatic lobe was sampled. Biopsy needle was confirmed within the lesion. Adequate specimens were obtained and placed in formalin. No immediate bleeding or hematoma formation. IMPRESSION: Ultrasound-guided core biopsy of right hepatic lesion. Electronically Signed   By: Markus Daft M.D.   On: 02/17/2020 16:16   US Venous Img Lower Bilateral (DVT)  Result Date: 02/14/2020 CLINICAL DATA:  Pulmonary embolus EXAM: LEFT LOWER EXTREMITY VENOUS DOPPLER ULTRASOUND TECHNIQUE: Gray-scale sonography with graded compression, as well as color Doppler and duplex ultrasound were performed to evaluate the lower extremity deep venous systems from the level of the common femoral vein and including the common femoral, femoral, profunda femoral, popliteal and calf veins including the posterior tibial, peroneal and gastrocnemius veins when visible. The superficial great saphenous vein was also interrogated. Spectral Doppler was  utilized to evaluate flow at rest and with distal augmentation maneuvers in the common femoral, femoral and popliteal veins. COMPARISON:  None. FINDINGS: Contralateral Common Femoral Vein: Respiratory phasicity is normal and symmetric with the symptomatic side. No evidence of thrombus. Normal compressibility. Common Femoral Vein: No evidence of thrombus. Normal compressibility, respiratory phasicity and response to augmentation. Saphenofemoral Junction: No evidence of thrombus. Normal compressibility and flow on color Doppler imaging. Profunda Femoral Vein: No evidence of thrombus. Normal compressibility and flow on color Doppler imaging. Femoral Vein: No evidence of thrombus. Normal compressibility, respiratory phasicity and response to augmentation. Popliteal Vein:  No evidence of thrombus. Normal compressibility, respiratory phasicity and response to augmentation. Calf Veins: There is nonocclusive thrombus within the left peroneal veins. Superficial Great Saphenous Vein: No evidence of thrombus. Normal compressibility. Venous Reflux:  None. Other Findings:  None. IMPRESSION: Nonocclusive thrombus within the left peroneal veins. Electronically Signed   By: Ulyses Jarred M.D.   On: 02/14/2020 01:36   DG Chest Port 1 View  Result Date: 02/20/2020 CLINICAL DATA:  Dyspnea EXAM: PORTABLE CHEST 1 VIEW COMPARISON:  02/18/2020, CT 02/13/2020 FINDINGS: Multiple bilateral lung nodules. No acute consolidation or effusion. Enlarged cardiomediastinal silhouette. Bilateral hilar enlargement likely due to nodes. No pneumothorax. IMPRESSION: 1. No acute airspace disease. 2. Bilateral pulmonary nodules/masses and hilar enlargement, consistent with metastatic disease. Electronically Signed   By: Donavan Foil M.D.   On: 02/20/2020 18:12   DG Chest Port 1 View  Result Date: 02/18/2020 CLINICAL DATA:  Dyspnea, abdominal pain EXAM: PORTABLE CHEST 1 VIEW COMPARISON:  Chest radiographs, 11/13/2015, CT chest 02/13/2020 FINDINGS:  The heart size and mediastinal contours are within normal limits. Low volume AP portable examination without acute abnormality. Multiple bilateral pulmonary masses and nodules. The visualized skeletal structures are unremarkable. IMPRESSION: 1. No acute abnormality of the lungs in low volume AP portable examination. 2. Multiple bilateral pulmonary masses and nodules as seen on prior CT and in keeping with metastatic disease. Electronically Signed   By: Eddie Candle M.D.   On: 02/18/2020 17:15   DG OR UROLOGY CYSTO IMAGE ([place])  Result Date: 02/07/2020 There is no interpretation for this exam.  This order is for images obtained during a surgical procedure.  Please See "Surgeries" Tab for more information regarding the procedure.   ECHOCARDIOGRAM COMPLETE  Result Date: 02/14/2020    ECHOCARDIOGRAM REPORT   Patient Name:   ALBI RAPPAPORT Date of Exam: 02/14/2020 Medical Rec #:  [id number]       Height:       73.0 in Accession #:    [id number]      Weight:       270.0 lb Date of Birth:  03/31/1946        BSA:          2.444 m Patient Age:    52 years        BP:           132/71 mmHg Patient Gender: M               HR:           70 bpm. Exam Location:  [facility] Procedure: 2D Echo, Color Doppler, Cardiac Doppler and Intracardiac            Opacification Agent Indications:     I48.91 Atrial Fibrillation  History:         Patient has prior history of Echocardiogram examinations.                  Stroke; Risk Factors:Sleep Apnea, Hypertension, Dyslipidemia                  and Diabetes.  Sonographer:     Charmayne Sheer RDCS (AE) Referring Phys:  [id number] [place] Diagnosing Phys: Neoma Laming MD  Sonographer Comments: Technically difficult study due to poor echo windows. Image acquisition challenging due to patient body habitus. IMPRESSIONS  1. Left ventricular ejection fraction, by estimation, is 60 to 65%. The left ventricle has normal function. The left ventricle has no regional wall motion abnormalities.  There  is mild left ventricular hypertrophy. Left ventricular diastolic parameters are consistent with Grade I diastolic dysfunction (impaired relaxation).  2. Right ventricular systolic function is normal. The right ventricular size is normal.  3. Left atrial size was mildly dilated.  4. Right atrial size was mildly dilated.  5. The mitral valve is normal in structure. No evidence of mitral valve regurgitation. No evidence of mitral stenosis.  6. The aortic valve is normal in structure. Aortic valve regurgitation is not visualized. Mild aortic valve sclerosis is present, with no evidence of aortic valve stenosis.  7. The inferior vena cava is normal in size with greater than 50% respiratory variability, suggesting right atrial pressure of 3 mmHg. FINDINGS  Left Ventricle: Left ventricular ejection fraction, by estimation, is 60 to 65%. The left ventricle has normal function. The left ventricle has no regional wall motion abnormalities. Definity contrast agent was given IV to delineate the left ventricular  endocardial Edd Reppert. The left ventricular internal cavity size was normal in size. There is mild left ventricular hypertrophy. Left ventricular diastolic parameters are consistent with Grade I diastolic dysfunction (impaired relaxation). Right Ventricle: The right ventricular size is normal. No increase in right ventricular wall thickness. Right ventricular systolic function is normal. Left Atrium: Left atrial size was mildly dilated. Right Atrium: Right atrial size was mildly dilated. Pericardium: There is no evidence of pericardial effusion. Mitral Valve: The mitral valve is normal in structure. No evidence of mitral valve regurgitation. No evidence of mitral valve stenosis. MV peak gradient, 6.0 mmHg. The mean mitral valve gradient is 2.0 mmHg. Tricuspid Valve: The tricuspid valve is normal in structure. Tricuspid valve regurgitation is not demonstrated. No evidence of tricuspid stenosis. Aortic Valve: The aortic  valve is normal in structure. Aortic valve regurgitation is not visualized. Mild aortic valve sclerosis is present, with no evidence of aortic valve stenosis. Aortic valve mean gradient measures 3.0 mmHg. Aortic valve peak gradient measures 7.3 mmHg. Pulmonic Valve: The pulmonic valve was normal in structure. Pulmonic valve regurgitation is not visualized. No evidence of pulmonic stenosis. Aorta: The aortic root is normal in size and structure. Venous: The inferior vena cava is normal in size with greater than 50% respiratory variability, suggesting right atrial pressure of 3 mmHg. IAS/Shunts: No atrial level shunt detected by color flow Doppler.   LV Volumes (MOD) LV vol d, MOD A4C: 91.4 ml Diastology LV vol s, MOD A4C: 26.8 ml LV e' medial:    8.81 cm/s LV SV MOD A4C:     91.4 ml LV E/e' medial:  7.1                            LV e' lateral:   12.40 cm/s                            LV E/e' lateral: 5.0  AORTIC VALVE AV Vmax:           135.00 cm/s AV Vmean:          84.100 cm/s AV VTI:            0.227 m AV Peak Grad:      7.3 mmHg AV Mean Grad:      3.0 mmHg LVOT Vmax:         84.20 cm/s LVOT Vmean:        56.100 cm/s LVOT VTI:  0.108 m LVOT/AV VTI ratio: 0.48 MITRAL VALVE MV Area (PHT): 4.49 cm    SHUNTS MV Peak grad:  6.0 mmHg    Systemic VTI: 0.11 m MV Mean grad:  2.0 mmHg MV Vmax:       1.22 m/s MV Vmean:      64.5 cm/s MV Decel Time: 169 msec MV E velocity: 62.60 cm/s MV A velocity: 85.30 cm/s MV E/A ratio:  0.73 Neoma Laming MD Electronically signed by Neoma Laming MD Signature Date/Time: 02/14/2020/4:30:36 PM    Final    CT Renal Stone Study  Result Date: 02/13/2020 CLINICAL DATA:  Prostate cancer. Pink colored urine. Hypotension. Flank pain. EXAM: CT ABDOMEN AND PELVIS WITHOUT CONTRAST TECHNIQUE: Multidetector CT imaging of the abdomen and pelvis was performed following the standard protocol without IV contrast. COMPARISON:  12/23/2017 CT abdomen/pelvis. FINDINGS: Lower chest: Numerous  (greater than 20) solid pulmonary nodules and masses at both lung bases, largest 5.2 cm in the basilar right lower lobe (series 4/image 27) and 5.5 cm in the basilar left lower lobe (series 4/image 27), all new. Atherosclerosis. Hepatobiliary: Multiple (at least 5) hypodense liver masses scattered throughout the liver, largest 3.8 cm in the segment 6 right liver lobe (series 2/image 36) and 2.0 cm in the superior right liver (series 2/image 22), all new. Cholelithiasis. No gallbladder wall thickening or pericholecystic fluid. No biliary ductal dilatation. Pancreas: Normal, with no mass or duct dilation. Spleen: Normal size. No mass. Adrenals/Urinary Tract: Normal right adrenal. Left adrenal 1.5 cm nodule with density 17 HU, stable, most compatible with an adenoma. Moderate bilateral hydroureteronephrosis to the level of the ureterovesical junctions bilaterally. No renal stones. No ureteral stones. Simple 3.7 cm posterior lower left renal cyst. No additional contour deforming renal lesions. Multiple layering stones in distended bladder, largest 19 mm. Chronic mild diffuse bladder wall thickening is unchanged. Foley catheter in place with balloon in the dependent bladder. Stomach/Bowel: Normal non-distended stomach. Normal caliber small bowel with no small bowel wall thickening. Normal appendix. Normal large bowel with no diverticulosis, large bowel wall thickening or pericolonic fat stranding. Vascular/Lymphatic: Atherosclerotic nonaneurysmal abdominal aorta. No pathologically enlarged lymph nodes in the abdomen or pelvis. Reproductive: Newly mildly enlarged, irregular and poorly marginated prostate, directly contiguous with the bladder trigone. Fiducial markers noted in the prostate as before. Other: No pneumoperitoneum, ascites or focal fluid collection. Solid subcutaneous 2.9 cm lateral right chest wall mass (series 2/image 29), new. Similar solid subcutaneous 4.4 cm left gluteal mass (series 2/image 63). Small  fat containing umbilical hernia is stable. Musculoskeletal: Numerous new sclerotic osseous lesions throughout the visualized skeleton including multiple lower ribs bilaterally, multiple thoracolumbar vertebrae largest at T11 and throughout the bilateral pelvic girdle, most prominent in the left iliac crest (series 2/image 60). Marked lumbar spondylosis. IMPRESSION: 1. Numerous new solid pulmonary nodules and masses at both lung bases, largest 5.2 cm in the basilar right lower lobe, compatible with pulmonary metastases. 2. New widespread sclerotic osseous metastases throughout the visualized skeleton as detailed. 3. Multiple new hypodense liver masses compatible with liver metastases. 4. Newly mildly enlarged, irregular and poorly marginated prostate, directly contiguous with the bladder trigone, suggestive of recurrent locally advanced prostate cancer. 5. Moderate bilateral hydroureteronephrosis to the level of the UVJ bilaterally, probably due to bladder outlet obstruction despite the well-positioned Foley catheter in the bladder. 6. Multiple layering bladder stones in the distended bladder. 7. Cholelithiasis. 8. Stable left adrenal adenoma. 9. Aortic Atherosclerosis (ICD10-I70.0). Electronically Signed   By: Ilona Sorrel  M.D.   On: 02/13/2020 10:06   US Abdomen Limited RUQ  Result Date: 02/17/2020 CLINICAL DATA:  74 year old with history of prostate cancer. Suspicious liver lesions on noncontrast CT. EXAM: ULTRASOUND ABDOMEN LIMITED RIGHT UPPER QUADRANT COMPARISON:  CT renal stone protocol 02/13/2020 FINDINGS: Gallbladder: Echogenic stone at the base of the gallbladder measuring up to 1.0 cm. Common bile duct: Diameter: 0.5 cm Liver: Liver parenchyma is mildly heterogeneous. There are scattered hypoechoic liver lesions that correspond with the previous CT findings. Round hypoechoic lesion in the right hepatic lobe measures up to 2.5 cm. Round hypoechoic lesion along the inferior margin of the right hepatic  lobe measures up to 2.3 cm. Large hypoechoic lesion along the posterior aspect of the liver measures up to 4.2 cm. Portal vein is patent on color Doppler imaging with normal direction of blood flow towards the liver. Other: Partially visualized right kidney demonstrates dilatation of the right renal collecting system and similar to the recent CT. IMPRESSION: 1. Hypoechoic liver lesions. Findings are suggestive for metastatic disease. Patient is scheduled for ultrasound-guided core biopsy of a hepatic lesion. 2. Cholelithiasis. No evidence for gallbladder inflammation or biliary dilatation. Electronically Signed   By: Markus Daft M.D.   On: 02/17/2020 09:33    PERFORMANCE STATUS (ECOG) : 4 - Bedbound  Review of Systems Unless otherwise noted, a complete review of systems is negative.  Physical Exam General: ill appearing Cardiovascular: irregular Pulmonary: less labored, on O2 Abdomen: soft, nontender, + bowel sounds GU: Foley, tea colored urine Extremities: no edema, no joint deformities Skin: no rashes Neurological: Weakness but otherwise nonfocal  IMPRESSION: Serum creatinine downtrending with irrigation of Foley catheter.  Patient's breathing appears slightly less labored today but he overall still appears clinically frail.  He remains somewhat drowsy at times.  I met with patient, wife, and daughter.  Together, we reviewed patient's current medical problems including possible need for PCN.  All recognize that he has an aggressive stage IV cancer, which would likely require chemotherapy to prolong life.  Patient states clearly that he would not be interested in receiving chemotherapy.  He also says he is not interested in any more tubes, test, or procedures.  He verbalized feeling like the outcome would be the same and he is not interested in "prolonging suffering."  Instead, we discussed the option of comfort care and both patient and family verbalized agreement.  They would be interested in  transferring patient to the [facility] when a bed is available.  We discussed CODE STATUS.  Patient verbalized that he was not interested in resuscitation or life prolonging measures on machines.  His family were in agreement with DNR/DNI. DNR order was signed and placed in chart.   I called the hospice liaison who will put patient on the waiting list for hospice IPU.  In the interim, would recommend focusing on comfort measures and liberalizing visitation policy.  PLAN: -Comfort Care -Please continue bladder irrigation -DNR/DNI -Transfer to the [facility] when a bed is available -Hospice liaison to coordinate  Case and plan discussed with Dr. Billie Ruddy and Dr. [name]  Time Total: 60 minutes  Visit consisted of counseling and education dealing with the complex and emotionally intense issues of symptom management and palliative care in the setting of serious and potentially life-threatening illness.Greater than 50%  of this time was spent counseling and coordinating care related to the above assessment and plan.  Signed by: Altha Harm, PhD, NP-C

## 2020-02-21 NOTE — Progress Notes (Addendum)
Urology Inpatient Progress Note  Subjective: Palliative care consulted overnight, patient reevaluating code status. Plan to continue current scope of treatment pending further goals discussion with family. No acute events overnight. Creatinine down today, 2.98. WBC count down today, 15.4. Repeat UA yesterday with >50 RBCs/hpf and >50 WBCs/hpf. On antibiotics as below. Hgb and HCT down today, 9.4 and 29.0%, respectively. Foley catheter draining red urine today without clots. He is accompanied today at the bedside by his granddaughter.  Patient gives explicit permission to discuss his health status with her.  Anti-infectives: Anti-infectives (From admission, onward)   Start     Dose/Rate Route Frequency Ordered Stop   02/19/20 2200  cephALEXin (KEFLEX) capsule 500 mg  Status:  Discontinued        500 mg Oral Every 8 hours 02/19/20 1607 02/20/20 1614   02/18/20 0600  cephALEXin (KEFLEX) capsule 500 mg  Status:  Discontinued        500 mg Oral Every 6 hours 02/17/20 1252 02/19/20 1607   02/13/20 1000  ceFEPIme (MAXIPIME) 2 g in sodium chloride 0.9 % 100 mL IVPB  Status:  Discontinued        2 g 200 mL/hr over 30 Minutes Intravenous  Once 02/13/20 0942 02/13/20 0953   02/13/20 1000  cefTRIAXone (ROCEPHIN) 2 g in sodium chloride 0.9 % 100 mL IVPB  Status:  Discontinued        2 g 200 mL/hr over 30 Minutes Intravenous Every 24 hours 02/13/20 0954 02/17/20 1252      Current Facility-Administered Medications  Medication Dose Route Frequency Provider Last Rate Last Admin  . acetaminophen (TYLENOL) tablet 650 mg  650 mg Oral Q6H PRN Ivor Costa, MD   650 mg at 02/21/20 0029  . acetaminophen-codeine (TYLENOL #3) 300-30 MG per tablet 1 tablet  1 tablet Oral Q4H PRN Earlie Server, MD   1 tablet at 02/19/20 2331  . amiodarone (PACERONE) tablet 400 mg  400 mg Oral BID Adaline Sill, NP   400 mg at 02/20/20 2221  . Chlorhexidine Gluconate Cloth 2 % PADS 6 each  6 each Topical Daily Ivor Costa, MD   6 each  at 02/20/20 0857  . ferrous sulfate tablet 325 mg  325 mg Oral BID WC Earlie Server, MD   325 mg at 02/20/20 1702  . heparin ADULT infusion 100 units/mL (25000 units/248mL sodium chloride 0.45%)  1,700 Units/hr Intravenous Continuous Enzo Bi, MD      . hydrALAZINE (APRESOLINE) injection 5 mg  5 mg Intravenous Q2H PRN Ivor Costa, MD      . levETIRAcetam (KEPPRA) tablet 500 mg  500 mg Oral BID Ivor Costa, MD   500 mg at 02/20/20 2220  . LORazepam (ATIVAN) injection 0.5 mg  0.5 mg Intravenous Q12H PRN Ivor Costa, MD   0.5 mg at 02/21/20 0028  . LORazepam (ATIVAN) injection 0.5-1 mg  0.5-1 mg Intravenous Q2H PRN Ivor Costa, MD      . metoprolol tartrate (LOPRESSOR) tablet 50 mg  50 mg Oral BID Ivor Costa, MD   50 mg at 02/20/20 2221  . rosuvastatin (CRESTOR) tablet 5 mg  5 mg Oral QHS Ivor Costa, MD   5 mg at 02/20/20 2221  . senna-docusate (Senokot-S) tablet 2 tablet  2 tablet Oral QHS Enzo Bi, MD   2 tablet at 02/18/20 2129  . simethicone (MYLICON) chewable tablet 80 mg  80 mg Oral QID PRN Enzo Bi, MD   80 mg at 02/18/20 1329  . sodium  chloride flush (NS) 0.9 % injection 3 mL  3 mL Intravenous PRN Lorella Nimrod, MD   3 mL at 02/14/20 2314   Objective: Vital signs in last 24 hours: Temp:  [97.7 F (36.5 C)-98.6 F (37 C)] 97.7 F (36.5 C) (10/19 0445) Pulse Rate:  [62-84] 62 (10/19 0635) Resp:  [18-32] 27 (10/19 0635) BP: (93-137)/(55-78) 111/59 (10/19 0635) SpO2:  [90 %-95 %] 94 % (10/19 5366) Weight:  [440 kg] 117 kg (10/19 0445)  Intake/Output from previous day: 10/18 0701 - 10/19 0700 In: 540 [P.O.:360] Out: 4150 [Urine:4150] Intake/Output this shift: Total I/O In: 60 [Other:60] Out: 175 [Urine:175]  Physical Exam Vitals and nursing note reviewed.  Constitutional:      General: He is not in acute distress.    Appearance: He is not ill-appearing, toxic-appearing or diaphoretic.  HENT:     Head: Normocephalic and atraumatic.  Pulmonary:     Effort: Pulmonary effort is  normal. No respiratory distress.  Skin:    General: Skin is warm and dry.  Psychiatric:        Mood and Affect: Mood normal.        Behavior: Behavior normal.    Lab Results:  Recent Labs    02/20/20 0257 02/21/20 0422  WBC 18.2* 15.4*  HGB 10.6* 9.4*  HCT 32.1* 29.0*  PLT 304 329   BMET Recent Labs    02/20/20 0257 02/21/20 0422  NA 135 136  K 5.1 5.0  CL 102 105  CO2 17* 18*  GLUCOSE 136* 135*  BUN 78* 88*  CREATININE 4.06* 2.98*  CALCIUM 9.4 9.1   Assessment & Plan: 74 year old male with metastatic prostate cancer s/p cystoscopy with urethral dilation with Dr. [name] on 02/07/2020 who was subsequently admitted for management of gross hematuria and found to have bilateral PEs, now on anticoagulation, with concern for clot retention and AKI.  Creatinine improved today following scheduled manual irrigation overnight.  Recommend continued manual irrigation to keep his catheter draining with plans for possible bilateral nephrostomy tube placement tomorrow pending further conversations with the patient and his family regarding goals of care.  Continue heparin in lieu of Eliquis in advance of possible procedure.  Recommendations: -Continue goals of care conversation with patient and family, particularly regarding willingness to undergo bilateral PCN placement -Continue irrigating Foley with 60 cc of sterile saline every 4 hours to keep catheter draining -Continue heparin in advance of possible PCN placement  Debroah Loop, PA-C 02/21/2020

## 2020-02-21 NOTE — TOC Progression Note (Signed)
Transition of Care [facility]) - Progression Note    Patient Details  Name: Timothy Maddox MRN: [id number] Date of Birth: Feb 24, 1946  Transition [facility]) CM/SW Contact  Beverly Sessions, RN Phone Number: 02/21/2020, 3:57 PM  Clinical Narrative:     Notified by Kieth Brightly from [employer] that she received a Hospice home Referral from [facility] with Palliative.  Per Palliative note patient comfort care and awaiting bed at hospice home       Expected Discharge Plan and Services                                                 Social Determinants of Health (SDOH) Interventions    Readmission Risk Interventions No flowsheet data found.

## 2020-02-21 NOTE — Progress Notes (Signed)
Wife Timothy Maddox and Daughter Trost at bedside. Met with Palliative and Hospice. Code status changed to DNR. Wrist band placed on left wrist.   Complicated family dynamics with extended family members. Patient stated to me he only wanted wife, daughter and granddaughter in room to see him. All other visitors are denied at this time. Visitors entrance made aware of patient's request. Password charted for patient security.   Patient will be transferred to Hospice when bed is available.

## 2020-02-22 DIAGNOSIS — N139 Obstructive and reflux uropathy, unspecified: Secondary | ICD-10-CM

## 2020-02-22 DIAGNOSIS — I2699 Other pulmonary embolism without acute cor pulmonale: Secondary | ICD-10-CM | POA: Diagnosis not present

## 2020-02-22 DIAGNOSIS — J9601 Acute respiratory failure with hypoxia: Secondary | ICD-10-CM | POA: Diagnosis not present

## 2020-02-22 DIAGNOSIS — C61 Malignant neoplasm of prostate: Secondary | ICD-10-CM | POA: Diagnosis not present

## 2020-02-22 DIAGNOSIS — N179 Acute kidney failure, unspecified: Secondary | ICD-10-CM | POA: Diagnosis not present

## 2020-02-22 DIAGNOSIS — N390 Urinary tract infection, site not specified: Secondary | ICD-10-CM | POA: Diagnosis not present

## 2020-02-22 DIAGNOSIS — R338 Other retention of urine: Secondary | ICD-10-CM

## 2020-02-22 LAB — URINE CULTURE: Culture: NO GROWTH

## 2020-02-22 MED ORDER — ACETAMINOPHEN 325 MG PO TABS
650.0000 mg | ORAL_TABLET | Freq: Four times a day (QID) | ORAL | Status: DC | PRN
  Filled 2020-02-22: qty 2

## 2020-02-22 MED ORDER — ONDANSETRON 4 MG PO TBDP
4.0000 mg | ORAL_TABLET | Freq: Three times a day (TID) | ORAL | Status: DC | PRN
  Filled 2020-02-22: qty 1

## 2020-02-22 MED ORDER — ONDANSETRON HCL 4 MG/2ML IJ SOLN
4.0000 mg | Freq: Four times a day (QID) | INTRAMUSCULAR | Status: DC | PRN
  Administered 2020-02-22 – 2020-02-24 (×4): 4 mg via INTRAVENOUS
  Filled 2020-02-22 (×4): qty 2

## 2020-02-22 MED ORDER — ACETAMINOPHEN 500 MG PO TABS
1000.0000 mg | ORAL_TABLET | Freq: Four times a day (QID) | ORAL | Status: DC | PRN
  Administered 2020-02-22: 1000 mg via ORAL

## 2020-02-22 MED ORDER — MORPHINE SULFATE (PF) 2 MG/ML IV SOLN
1.0000 mg | INTRAVENOUS | Status: DC | PRN
  Administered 2020-02-22 – 2020-02-24 (×2): 1 mg via INTRAVENOUS
  Filled 2020-02-22 (×2): qty 1

## 2020-02-22 NOTE — Progress Notes (Signed)
OT Cancellation Note  Patient Details Name: Timothy Maddox MRN: [id number] DOB: Sep 21, 1945   Cancelled Treatment:    Reason Eval/Treat Not Completed: Other (comment)  Pt transitioned to comfort measures. OT order completed by care team. Will sign off at this time. Thank you for consulting OT in the care of this patient.   Gerrianne Scale, [place], OTR/L ascom [phone] 02/22/20, 11:48 AM

## 2020-02-22 NOTE — Progress Notes (Signed)
Hematology/Oncology Progress Note [facility] [phone] [phone]  Patient Care Team: [name], MD as PCP - General (Internal Medicine) Abbie Sons, MD (Urology)   Name of the patient: Timothy Maddox  [id number]  [birth year]  Date of visit: 02/22/20   INTERVAL HISTORY-  No acute events overnight. Patient remains on nasal cannula oxygen.  Wife and daughter are at the bedside. Patient and family members discussed with palliative care yesterday and decided to proceed with comfort care. Foley catheter does not drain well. Patient refuses PCN placement.  Review of systems- Review of Systems  Constitutional: Positive for fatigue. Negative for chills and fever.  HENT:   Negative for hearing loss and voice change.   Eyes: Negative for eye problems and icterus.  Respiratory: Positive for shortness of breath. Negative for chest tightness and cough.   Cardiovascular: Negative for chest pain and leg swelling.  Gastrointestinal: Negative for abdominal distention and abdominal pain.  Endocrine: Negative for hot flashes.  Genitourinary: Positive for hematuria. Negative for difficulty urinating, dysuria and frequency.        Foley catheter does not drain well  Musculoskeletal: Negative for arthralgias.  Skin: Negative for itching and rash.  Neurological: Negative for light-headedness and numbness.  Hematological: Negative for adenopathy. Does not bruise/bleed easily.  Psychiatric/Behavioral: Negative for confusion.    Allergies  Allergen Reactions  . Poison Oak Extract Rash  . Poison Ivy Extract Rash    Patient Active Problem List   Diagnosis Date Noted  . Acute hypoxemic respiratory failure ([place]) 02/21/2020  . Iron deficiency anemia due to chronic blood loss   . Palliative care encounter   . PE (pulmonary thromboembolism) ([place])   . Liver mass   . Hematuria 02/13/2020  . Hypotension 02/13/2020  . Urinary tract infection  associated with indwelling urethral catheter ([place]) 02/13/2020  . Atrial fibrillation with RVR ([place]) 02/13/2020  . Prediabetes   . Hypertension   . Type II diabetes mellitus with renal manifestations ([place])   . AKI (acute kidney injury) ([place])   . Chronic diastolic CHF (congestive heart failure) ([place])   . Stage 3a chronic kidney disease ([place]) 01/30/2020  . Androgen deprivation therapy 01/30/2020  . Encounter for antineoplastic chemotherapy 09/24/2018  . Weak urine stream 09/24/2018  . Rising PSA level 07/08/2018  . Prostate cancer ([place]) 07/08/2018  . Goals of care, counseling/discussion 12/31/2017  . Osteoarthritis of right knee 07/20/2017  . Seizure ([place]) 12/19/2015  . 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 Seizures ([place]) 12/19/2015  . AVF (arteriovenous fistula) ([place]) 11/16/2015  . ICH (intracerebral hemorrhage) ([place]) 11/16/2015  . Stroke [facility]) 11/13/2015     Past Medical History:  Diagnosis Date  . Anxiety   . Arthritis    lower back, shoulders  . AVM (arteriovenous malformation) brain   . Cancer [facility])    prostate- treated with radiation  . CVA (cerebral infarction) 11/2015  . Depression   . Diabetes mellitus without complication ([place])   . Dyspnea    with exertion  . HOH (hard of hearing)   . Hyperlipidemia   . Hypertension   . Pneumonia    1st grade  . Pre-diabetes   . Prediabetes   . Seizures ([place]) 12/2015  . Sleep apnea   . Stroke [facility])    speech impairment, no weakness     Past Surgical History:  Procedure Laterality Date  . BACK SURGERY  1999   disectomy  . BACK SURGERY  2 surgery- injury  . COLONOSCOPY W/ POLYPECTOMY    . COLONOSCOPY WITH PROPOFOL N/A 02/28/2019   Procedure: COLONOSCOPY WITH PROPOFOL;  Surgeon: Toledo, Benay Pike, MD;  Location: [facility] ENDOSCOPY;  Service: Gastroenterology;  Laterality: N/A;  . CYSTOSCOPY WITH URETHRAL DILATATION Bilateral 02/07/2020   Procedure: CYSTOSCOPY WITH URETHRAL DILATATION;  Surgeon: Abbie Sons, MD;  Location: [facility] ORS;  Service: Urology;  Laterality: Bilateral;  . HAMMER TOE SURGERY Bilateral   . INGUINAL HERNIA REPAIR Right    per patient when he was in 1st grade  . IR GENERIC HISTORICAL  12/05/2015   IR RADIOLOGIST EVAL & MGMT 12/05/2015 [facility]  . IR GENERIC HISTORICAL  01/31/2016   IR RADIOLOGIST EVAL & MGMT 01/31/2016 [facility]  . IR GENERIC HISTORICAL  02/20/2016   IR ANGIO INTRA EXTRACRAN SEL COM CAROTID INNOMINATE UNI L MOD SED 02/20/2016 [name], MD [facility]  . IR GENERIC HISTORICAL  02/20/2016   IR 3D INDEPENDENT WKST 02/20/2016 [name], MD [facility]  . KNEE SURGERY Bilateral 2015  . RADIOLOGY WITH ANESTHESIA N/A 02/20/2016   Procedure: EMBOLIZATION;  Surgeon: [name], MD;  Location: [place];  Service: Radiology;  Laterality: N/A;  . TOE SURGERY Left 2017   TOE NAIL REMOVAL  . TOTAL KNEE ARTHROPLASTY Right 07/20/2017   Procedure: TOTAL KNEE ARTHROPLASTY;  Surgeon: Lovell Sheehan, MD;  Location: [facility] ORS;  Service: Orthopedics;  Laterality: Right;    Social History   Socioeconomic History  . Marital status: Married    Spouse name: Not on file  . Number of children: Not on file  . Years of education: Not on file  . Highest education level: Not on file  Occupational History  . Occupation: retired Curator  Tobacco Use  . Smoking status: Former Smoker    Packs/day: 1.00    Years: 2.00    Pack years: 2.00    Types: Cigarettes    Quit date: 05/05/1966    Years since quitting: 53.8  . Smokeless tobacco: Never Used  . Tobacco comment: quit age 45  Vaping Use  . Vaping Use: Never used  Substance and Sexual Activity  . Alcohol use: Not Currently  . Drug use: No  . Sexual activity: Not Currently  Other Topics Concern  . Not on file  Social History Narrative  . Not on file   Social Determinants of Health   Financial Resource Strain:   . Difficulty of Paying Living Expenses: Not on file  Food Insecurity:     . Worried About Charity fundraiser in the Last Year: Not on file  . Ran Out of Food in the Last Year: Not on file  Transportation Needs:   . Lack of Transportation (Medical): Not on file  . Lack of Transportation (Non-Medical): Not on file  Physical Activity:   . Days of Exercise per Week: Not on file  . Minutes of Exercise per Session: Not on file  Stress:   . Feeling of Stress : Not on file  Social Connections:   . Frequency of Communication with Friends and Family: Not on file  . Frequency of Social Gatherings with Friends and Family: Not on file  . Attends Religious Services: Not on file  . Active Member of Clubs or Organizations: Not on file  . Attends Archivist Meetings: Not on file  . Marital Status: Not on file  Intimate Partner Violence:   . Fear of Current or Ex-Partner: Not on file  .  Emotionally Abused: Not on file  . Physically Abused: Not on file  . Sexually Abused: Not on file     Family History  Problem Relation Age of Onset  . Heart failure Father   . Diabetes Father   . Hypertension Father   . Alzheimer's disease Father 38  . Other Mother 1       homicide  . Alcohol abuse Mother   . Pancreatic cancer Sister   . Alcohol abuse Sister   . Diabetes Brother   . Diabetes Paternal Aunt   . Diabetes Paternal Grandmother   . Diabetes Paternal Grandfather      Current Facility-Administered Medications:  .  acetaminophen (TYLENOL) tablet 1,000 mg, 1,000 mg, Oral, Q6H PRN, Enzo Bi, MD, 1,000 mg at 02/22/20 0828 .  amiodarone (PACERONE) tablet 200 mg, 200 mg, Oral, BID, Adaline Sill, NP, 200 mg at 02/22/20 0827 .  Chlorhexidine Gluconate Cloth 2 % PADS 6 each, 6 each, Topical, Daily, Ivor Costa, MD, 6 each at 02/22/20 9629 .  hydrALAZINE (APRESOLINE) injection 5 mg, 5 mg, Intravenous, Q2H PRN, Ivor Costa, MD .  levETIRAcetam (KEPPRA) tablet 500 mg, 500 mg, Oral, BID, Ivor Costa, MD, 500 mg at 02/22/20 5284 .  LORazepam (ATIVAN) injection 0.5  mg, 0.5 mg, Intravenous, Q12H PRN, Ivor Costa, MD, 0.5 mg at 02/22/20 0057 .  LORazepam (ATIVAN) injection 0.5-1 mg, 0.5-1 mg, Intravenous, Q2H PRN, Ivor Costa, MD .  metoprolol tartrate (LOPRESSOR) tablet 50 mg, 50 mg, Oral, BID, Ivor Costa, MD, 50 mg at 02/22/20 1324 .  morphine 2 MG/ML injection 1 mg, 1 mg, Intravenous, Q2H PRN, Enzo Bi, MD, 1 mg at 02/22/20 1543 .  morphine CONCENTRATE 10 MG/0.5ML oral solution 5 mg, 5 mg, Oral, Q2H PRN, 5 mg at 02/22/20 1308 **OR** morphine CONCENTRATE 10 MG/0.5ML oral solution 5 mg, 5 mg, Sublingual, Q2H PRN, Enzo Bi, MD .  ondansetron [facility]) injection 4 mg, 4 mg, Intravenous, Q6H PRN, Enzo Bi, MD, 4 mg at 02/22/20 1543 .  ondansetron (ZOFRAN-ODT) disintegrating tablet 4 mg, 4 mg, Oral, Q8H PRN, Enzo Bi, MD .  senna-docusate (Senokot-S) tablet 2 tablet, 2 tablet, Oral, Glyn Ade, MD, 2 tablet at 02/18/20 2129 .  simethicone (MYLICON) chewable tablet 80 mg, 80 mg, Oral, QID PRN, Enzo Bi, MD, 80 mg at 02/18/20 1329 .  sodium chloride flush (NS) 0.9 % injection 3 mL, 3 mL, Intravenous, PRN, Lorella Nimrod, MD, 3 mL at 02/14/20 2314   Physical exam:  Vitals:   02/22/20 0328 02/22/20 0400 02/22/20 0500 02/22/20 0749  BP: 108/62 112/65  126/69  Pulse: 72 71  81  Resp: (!) 28 (!) 29  (!) 30  Temp: 98.8 F (37.1 C)   98 F (36.7 C)  TempSrc: Oral   Oral  SpO2: 94%   93%  Weight: 256 lb 1.6 oz (116.2 kg)  256 lb 2.8 oz (116.2 kg)   Height:       Physical Exam Constitutional:      General: He is not in acute distress.    Appearance: He is obese. He is ill-appearing. He is not diaphoretic.  HENT:     Head: Normocephalic and atraumatic.     Nose: Nose normal.     Mouth/Throat:     Pharynx: No oropharyngeal exudate.  Eyes:     General: No scleral icterus.    Pupils: Pupils are equal, round, and reactive to light.  Cardiovascular:     Rate and Rhythm: Normal rate  and regular rhythm.     Heart sounds: No murmur heard.   Pulmonary:      Effort: Pulmonary effort is normal.     Breath sounds: No rales.     Comments: Breathing via nasal cannula oxygen Chest:     Chest wall: No tenderness.  Abdominal:     General: There is no distension.     Palpations: Abdomen is soft.     Tenderness: There is no abdominal tenderness.  Genitourinary:    Comments: + Foley catheter Musculoskeletal:        General: Normal range of motion.     Cervical back: Normal range of motion and neck supple.  Skin:    General: Skin is warm and dry.     Findings: No erythema.  Neurological:     Mental Status: He is alert and oriented to person, place, and time.     Cranial Nerves: No cranial nerve deficit.     Motor: No abnormal muscle tone.     Coordination: Coordination normal.  Psychiatric:        Mood and Affect: Affect normal.        CMP Latest Ref Rng & Units 02/21/2020  Glucose 70 - 99 mg/dL 135(H)  BUN 8 - 23 mg/dL 88(H)  Creatinine 0.61 - 1.24 mg/dL 2.98(H)  Sodium 135 - 145 mmol/L 136  Potassium 3.5 - 5.1 mmol/L 5.0  Chloride 98 - 111 mmol/L 105  CO2 22 - 32 mmol/L 18(L)  Calcium 8.9 - 10.3 mg/dL 9.1  Total Protein 6.5 - 8.1 g/dL -  Total Bilirubin 0.3 - 1.2 mg/dL -  Alkaline Phos 38 - 126 U/L -  AST 15 - 41 U/L -  ALT 0 - 44 U/L -   CBC Latest Ref Rng & Units 02/21/2020  WBC 4.0 - 10.5 K/uL 15.4(H)  Hemoglobin 13.0 - 17.0 g/dL 9.4(L)  Hematocrit 39 - 52 % 29.0(L)  Platelets 150 - 400 K/uL 329    RADIOGRAPHIC STUDIES: I have personally reviewed the radiological images as listed and agreed with the findings in the report. DG Abd 1 View  Result Date: 02/18/2020 CLINICAL DATA:  Dyspnea and abdominal pain. Per physician notes - States he feels dyspnea with exertion as he recently used the restroom with Physical Therapy. Denies chest pain. Patient also denies any symptoms when he went into A fib with RVR last evening. Hx - prostate cancer, DM, HTN, PNA, former smoker. EXAM: ABDOMEN - 1 VIEW COMPARISON:  CT, 02/13/2020  FINDINGS: Increased bowel gas, but no bowel dilation to suggest obstruction. A catheter projects in the lower pelvis consistent with a urinary catheter. There are 3 radiation therapy markers projecting in the region of the prostate gland. A calcification is noted in the right upper quadrant consistent with a gallstone as was noted on the recent CT. There are scattered aortic and iliac atherosclerotic vascular calcifications. Soft tissues are poorly defined but otherwise unremarkable. No acute skeletal abnormality. Lung base nodules are incompletely imaged, better noted on the recent prior CT. IMPRESSION: 1. No acute findings.  No evidence of bowel obstruction. Electronically Signed   By: Lajean Manes M.D.   On: 02/18/2020 17:17   CT ANGIO CHEST PE W OR WO CONTRAST  Result Date: 02/13/2020 CLINICAL DATA:  Atrial fibrillation, elevated D-dimer, prostate cancer EXAM: CT ANGIOGRAPHY CHEST WITH CONTRAST TECHNIQUE: Multidetector CT imaging of the chest was performed using the standard protocol during bolus administration of intravenous contrast. Multiplanar CT image reconstructions and MIPs  were obtained to evaluate the vascular anatomy. CONTRAST:  69mL OMNIPAQUE IOHEXOL 350 MG/ML SOLN COMPARISON:  None. FINDINGS: Cardiovascular: There is adequate opacification of the pulmonary arterial tree. There are multiple branching central intraluminal filling defects identified within the right upper and right lower lobar pulmonary arteries as well as multiple segmental pulmonary arteries of the left lower lobe in keeping with acute pulmonary embolism. The central pulmonary arteries are enlarged in keeping with changes of pulmonary arterial hypertension. However, there is no CT evidence of right heart strain with the right to left ventricular ratio within normal limits. Moderate coronary artery calcification. Cardiac size is mildly enlarged. Moderate calcification of the mitral valve annulus. No pericardial effusion. The  thoracic aorta is of normal caliber. Mild atherosclerotic calcification is seen within the aortic arch and descending thoracic aorta. Mediastinum/Nodes: There is pathologic bilateral hilar adenopathy. Index lymph node within the right hilum measures 16 mm x 25 mm at axial image # 42/4. Thyroid unremarkable. Esophagus unremarkable. Lungs/Pleura: Multiple bilateral pulmonary masses are identified most in keeping with pulmonary metastatic disease in this patient with a known history of prostate cancer. Index lesion within the right lower lobe measures 3.5 x 5.2 cm at axial image # 62/6. At least 50 nodules are seen scattered throughout the lungs bilaterally. No pneumothorax or pleural effusion. The central airways are widely patent. Upper Abdomen: Unremarkable Musculoskeletal: Multiple sclerotic metastases are identified within the visualized axial skeleton involving the scapula bilaterally, the sternum, the thoracic spine, and multiple ribs bilaterally. No pathologic fracture. Review of the MIP images confirms the above findings. IMPRESSION: Acute pulmonary embolism. Moderate embolic burden. No CT evidence of right heart strain. Moderate coronary artery calcification.  Mild global cardiomegaly. Innumerable bilateral pulmonary masses and nodules as well as bilateral pathologic hilar adenopathy and widespread sclerotic osseous metastases in keeping with visceral and osseous metastatic disease related to the patient's underlying prostate cancer. Aortic Atherosclerosis (ICD10-I70.0). Electronically Signed   By: Fidela Salisbury MD   On: 02/13/2020 23:27   CT PELVIS WO CONTRAST  Result Date: 02/20/2020 CLINICAL DATA:  Urinary retention, prostate cancer, urethral stricture EXAM: CT PELVIS WITHOUT CONTRAST TECHNIQUE: Multidetector CT imaging of the pelvis was performed following the standard protocol without intravenous contrast. COMPARISON:  02/13/20. FINDINGS: Urinary Tract: Foley catheter balloon is again seen within  the bladder lumen. Despite this, the bladder appears mildly distended, raising the question of obstruction of the catheter. There are numerous layering calcifications within the bladder lumen, slightly improved from prior examination but persistent. Additionally, a small amount of calcific debris is seen within the Foley catheter lumen suggesting partial obstruction secondary to debris. Gas is seen within the bladder lumen related to probable catheterization. There is mild perivesicular inflammatory stranding, similar to prior examination, likely infectious or inflammatory in nature. There is again noted mild right and moderate left hydroureter which appears similar to that seen on prior examination suggesting obstruction of the ureters bilaterally at the ureterovesicular junctions. Locally invasive prostatic mass with invasion of the bladder trigone is again noted. Brachytherapy seeds are seen within the prostate gland. Bowel: Small broad-based umbilical hernia is again identified containing a single wall of the otherwise unremarkable mid transverse colon. The visualized large and small bowel are otherwise unremarkable. Appendix normal. No free fluid within the pelvis. Vascular/Lymphatic: Extensive aortoiliac atherosclerotic calcification. No aortic aneurysm. No pathologic adenopathy within the pelvis. Reproductive: Microlobulated prostatic mass invades the base of the bladder as described above. This is similar in appearance to prior examination. Other:  Small fat containing left inguinal hernia noted. Rectum unremarkable. Musculoskeletal: Sclerotic metastases are again identified within the visualized axial skeleton in keeping with osseous metastatic disease. Similarly, 4.7 cm soft tissue mass is seen within the subcutaneous fat of the left gluteal region in keeping with a soft tissue metastasis. IMPRESSION: Stable examination. Prostatic mass invades the base of the bladder with resultant obstruction of the  ureterovesicular junctions bilaterally and mild-to-moderate hydroureter noted bilaterally. Foley catheter balloon remains within the bladder lumen, however, mild bladder distension persists and calcific debris within the catheter lumen suggests partial obstruction due to debris. Layering bladder calculi again noted, slightly improved from prior examination possibly related to fragmentation. Perivesicular inflammatory stranding suggesting persistent infectious or inflammatory cystitis. Sclerotic osseous and subcutaneous soft tissue metastatic disease again identified. Aortic Atherosclerosis (ICD10-I70.0). Electronically Signed   By: Fidela Salisbury MD   On: 02/20/2020 16:05   US RENAL  Result Date: 02/19/2020 CLINICAL DATA:  Acute renal injury EXAM: RENAL / URINARY TRACT ULTRASOUND COMPLETE COMPARISON:  CT from 02/13/2020 FINDINGS: Right Kidney: Renal measurements: 13.0 x 6.8 x 5.7 cm. = volume: 266 mL. Mild to moderate hydronephrosis is noted. Left Kidney: Renal measurements: 14.8 x 7.1 x 6.1 cm. = volume: 333 mL. Moderate to severe hydronephrosis is noted on the left. Additionally there is a 3.9 cm cystic lesion in the lower pole which appears simple in nature. Bladder: Decompressed by Foley catheter. Multiple bladder calculi are again identified and stable. Other: None. IMPRESSION: Stable appearing hydronephrosis bilaterally worse on the left than the right this is stable from prior CT examination. Left renal cyst. Multiple bladder calculi. Electronically Signed   By: Inez Catalina M.D.   On: 02/19/2020 20:12   US BIOPSY (LIVER)  Result Date: 02/17/2020 INDICATION: 74 year old with history of prostate cancer and evidence for metastatic disease in the chest and liver. Tissue diagnosis is needed. EXAM: ULTRASOUND-GUIDED LIVER LESION BIOPSY MEDICATIONS: None. ANESTHESIA/SEDATION: Moderate (conscious) sedation was employed during this procedure. A total of Versed 1.0 mg and Fentanyl 100 mcg was administered  intravenously. Moderate Sedation Time: 16 minutes. The patient's level of consciousness and vital signs were monitored continuously by radiology nursing throughout the procedure under my direct supervision. FLUOROSCOPY TIME:  None COMPLICATIONS: None immediate. PROCEDURE: Informed written consent was obtained from the patient after a thorough discussion of the procedural risks, benefits and alternatives. All questions were addressed. A timeout was performed prior to the initiation of the procedure. The liver was evaluated with ultrasound. Hypoechoic lesion along the inferior right hepatic lobe was identified and targeted for biopsy. The right side of the abdomen was prepped with chlorhexidine and sterile field was created. Skin and soft tissues were anesthetized using 1% lidocaine. Small incision was made. Using ultrasound guidance, 17 gauge coaxial needle was directed into the right hepatic lobe and into the hypoechoic lesion. Total of 3 core biopsies were obtained with 18 gauge core device. Gel-Foam slurry was injected through the 17 gauge coaxial needle as it was removed. Bandage placed over the puncture site. FINDINGS: Scattered hypoechoic lesions in the liver. Lesion in the inferior right hepatic lobe was sampled. Biopsy needle was confirmed within the lesion. Adequate specimens were obtained and placed in formalin. No immediate bleeding or hematoma formation. IMPRESSION: Ultrasound-guided core biopsy of right hepatic lesion. Electronically Signed   By: Markus Daft M.D.   On: 02/17/2020 16:16   US Venous Img Lower Bilateral (DVT)  Result Date: 02/14/2020 CLINICAL DATA:  Pulmonary embolus EXAM: LEFT LOWER EXTREMITY VENOUS  DOPPLER ULTRASOUND TECHNIQUE: Gray-scale sonography with graded compression, as well as color Doppler and duplex ultrasound were performed to evaluate the lower extremity deep venous systems from the level of the common femoral vein and including the common femoral, femoral, profunda  femoral, popliteal and calf veins including the posterior tibial, peroneal and gastrocnemius veins when visible. The superficial great saphenous vein was also interrogated. Spectral Doppler was utilized to evaluate flow at rest and with distal augmentation maneuvers in the common femoral, femoral and popliteal veins. COMPARISON:  None. FINDINGS: Contralateral Common Femoral Vein: Respiratory phasicity is normal and symmetric with the symptomatic side. No evidence of thrombus. Normal compressibility. Common Femoral Vein: No evidence of thrombus. Normal compressibility, respiratory phasicity and response to augmentation. Saphenofemoral Junction: No evidence of thrombus. Normal compressibility and flow on color Doppler imaging. Profunda Femoral Vein: No evidence of thrombus. Normal compressibility and flow on color Doppler imaging. Femoral Vein: No evidence of thrombus. Normal compressibility, respiratory phasicity and response to augmentation. Popliteal Vein: No evidence of thrombus. Normal compressibility, respiratory phasicity and response to augmentation. Calf Veins: There is nonocclusive thrombus within the left peroneal veins. Superficial Great Saphenous Vein: No evidence of thrombus. Normal compressibility. Venous Reflux:  None. Other Findings:  None. IMPRESSION: Nonocclusive thrombus within the left peroneal veins. Electronically Signed   By: Ulyses Jarred M.D.   On: 02/14/2020 01:36   DG Chest Port 1 View  Result Date: 02/20/2020 CLINICAL DATA:  Dyspnea EXAM: PORTABLE CHEST 1 VIEW COMPARISON:  02/18/2020, CT 02/13/2020 FINDINGS: Multiple bilateral lung nodules. No acute consolidation or effusion. Enlarged cardiomediastinal silhouette. Bilateral hilar enlargement likely due to nodes. No pneumothorax. IMPRESSION: 1. No acute airspace disease. 2. Bilateral pulmonary nodules/masses and hilar enlargement, consistent with metastatic disease. Electronically Signed   By: Donavan Foil M.D.   On: 02/20/2020 18:12     DG Chest Port 1 View  Result Date: 02/18/2020 CLINICAL DATA:  Dyspnea, abdominal pain EXAM: PORTABLE CHEST 1 VIEW COMPARISON:  Chest radiographs, 11/13/2015, CT chest 02/13/2020 FINDINGS: The heart size and mediastinal contours are within normal limits. Low volume AP portable examination without acute abnormality. Multiple bilateral pulmonary masses and nodules. The visualized skeletal structures are unremarkable. IMPRESSION: 1. No acute abnormality of the lungs in low volume AP portable examination. 2. Multiple bilateral pulmonary masses and nodules as seen on prior CT and in keeping with metastatic disease. Electronically Signed   By: Eddie Candle M.D.   On: 02/18/2020 17:15   DG OR UROLOGY CYSTO IMAGE ([place])  Result Date: 02/07/2020 There is no interpretation for this exam.  This order is for images obtained during a surgical procedure.  Please See "Surgeries" Tab for more information regarding the procedure.   ECHOCARDIOGRAM COMPLETE  Result Date: 02/14/2020    ECHOCARDIOGRAM REPORT   Patient Name:   BECKER CHRISTOPHER Date of Exam: 02/14/2020 Medical Rec #:  [id number]       Height:       73.0 in Accession #:    [id number]      Weight:       270.0 lb Date of Birth:  Apr 05, 1946        BSA:          2.444 m Patient Age:    73 years        BP:           132/71 mmHg Patient Gender: M               HR:  70 bpm. Exam Location:  [facility] Procedure: 2D Echo, Color Doppler, Cardiac Doppler and Intracardiac            Opacification Agent Indications:     I48.91 Atrial Fibrillation  History:         Patient has prior history of Echocardiogram examinations.                  Stroke; Risk Factors:Sleep Apnea, Hypertension, Dyslipidemia                  and Diabetes.  Sonographer:     Charmayne Sheer RDCS (AE) Referring Phys:  [id number] Freer Diagnosing Phys: Neoma Laming MD  Sonographer Comments: Technically difficult study due to poor echo windows. Image acquisition challenging due to patient body  habitus. IMPRESSIONS  1. Left ventricular ejection fraction, by estimation, is 60 to 65%. The left ventricle has normal function. The left ventricle has no regional wall motion abnormalities. There is mild left ventricular hypertrophy. Left ventricular diastolic parameters are consistent with Grade I diastolic dysfunction (impaired relaxation).  2. Right ventricular systolic function is normal. The right ventricular size is normal.  3. Left atrial size was mildly dilated.  4. Right atrial size was mildly dilated.  5. The mitral valve is normal in structure. No evidence of mitral valve regurgitation. No evidence of mitral stenosis.  6. The aortic valve is normal in structure. Aortic valve regurgitation is not visualized. Mild aortic valve sclerosis is present, with no evidence of aortic valve stenosis.  7. The inferior vena cava is normal in size with greater than 50% respiratory variability, suggesting right atrial pressure of 3 mmHg. FINDINGS  Left Ventricle: Left ventricular ejection fraction, by estimation, is 60 to 65%. The left ventricle has normal function. The left ventricle has no regional wall motion abnormalities. Definity contrast agent was given IV to delineate the left ventricular  endocardial borders. The left ventricular internal cavity size was normal in size. There is mild left ventricular hypertrophy. Left ventricular diastolic parameters are consistent with Grade I diastolic dysfunction (impaired relaxation). Right Ventricle: The right ventricular size is normal. No increase in right ventricular wall thickness. Right ventricular systolic function is normal. Left Atrium: Left atrial size was mildly dilated. Right Atrium: Right atrial size was mildly dilated. Pericardium: There is no evidence of pericardial effusion. Mitral Valve: The mitral valve is normal in structure. No evidence of mitral valve regurgitation. No evidence of mitral valve stenosis. MV peak gradient, 6.0 mmHg. The mean mitral valve  gradient is 2.0 mmHg. Tricuspid Valve: The tricuspid valve is normal in structure. Tricuspid valve regurgitation is not demonstrated. No evidence of tricuspid stenosis. Aortic Valve: The aortic valve is normal in structure. Aortic valve regurgitation is not visualized. Mild aortic valve sclerosis is present, with no evidence of aortic valve stenosis. Aortic valve mean gradient measures 3.0 mmHg. Aortic valve peak gradient measures 7.3 mmHg. Pulmonic Valve: The pulmonic valve was normal in structure. Pulmonic valve regurgitation is not visualized. No evidence of pulmonic stenosis. Aorta: The aortic root is normal in size and structure. Venous: The inferior vena cava is normal in size with greater than 50% respiratory variability, suggesting right atrial pressure of 3 mmHg. IAS/Shunts: No atrial level shunt detected by color flow Doppler.   LV Volumes (MOD) LV vol d, MOD A4C: 91.4 ml Diastology LV vol s, MOD A4C: 26.8 ml LV e' medial:    8.81 cm/s LV SV MOD A4C:     91.4 ml LV  E/e' medial:  7.1                            LV e' lateral:   12.40 cm/s                            LV E/e' lateral: 5.0  AORTIC VALVE AV Vmax:           135.00 cm/s AV Vmean:          84.100 cm/s AV VTI:            0.227 m AV Peak Grad:      7.3 mmHg AV Mean Grad:      3.0 mmHg LVOT Vmax:         84.20 cm/s LVOT Vmean:        56.100 cm/s LVOT VTI:          0.108 m LVOT/AV VTI ratio: 0.48 MITRAL VALVE MV Area (PHT): 4.49 cm    SHUNTS MV Peak grad:  6.0 mmHg    Systemic VTI: 0.11 m MV Mean grad:  2.0 mmHg MV Vmax:       1.22 m/s MV Vmean:      64.5 cm/s MV Decel Time: 169 msec MV E velocity: 62.60 cm/s MV A velocity: 85.30 cm/s MV E/A ratio:  0.73 Neoma Laming MD Electronically signed by Neoma Laming MD Signature Date/Time: 02/14/2020/4:30:36 PM    Final    CT Renal Stone Study  Result Date: 02/13/2020 CLINICAL DATA:  Prostate cancer. Pink colored urine. Hypotension. Flank pain. EXAM: CT ABDOMEN AND PELVIS WITHOUT CONTRAST TECHNIQUE:  Multidetector CT imaging of the abdomen and pelvis was performed following the standard protocol without IV contrast. COMPARISON:  12/23/2017 CT abdomen/pelvis. FINDINGS: Lower chest: Numerous (greater than 20) solid pulmonary nodules and masses at both lung bases, largest 5.2 cm in the basilar right lower lobe (series 4/image 27) and 5.5 cm in the basilar left lower lobe (series 4/image 27), all new. Atherosclerosis. Hepatobiliary: Multiple (at least 5) hypodense liver masses scattered throughout the liver, largest 3.8 cm in the segment 6 right liver lobe (series 2/image 36) and 2.0 cm in the superior right liver (series 2/image 22), all new. Cholelithiasis. No gallbladder wall thickening or pericholecystic fluid. No biliary ductal dilatation. Pancreas: Normal, with no mass or duct dilation. Spleen: Normal size. No mass. Adrenals/Urinary Tract: Normal right adrenal. Left adrenal 1.5 cm nodule with density 17 HU, stable, most compatible with an adenoma. Moderate bilateral hydroureteronephrosis to the level of the ureterovesical junctions bilaterally. No renal stones. No ureteral stones. Simple 3.7 cm posterior lower left renal cyst. No additional contour deforming renal lesions. Multiple layering stones in distended bladder, largest 19 mm. Chronic mild diffuse bladder wall thickening is unchanged. Foley catheter in place with balloon in the dependent bladder. Stomach/Bowel: Normal non-distended stomach. Normal caliber small bowel with no small bowel wall thickening. Normal appendix. Normal large bowel with no diverticulosis, large bowel wall thickening or pericolonic fat stranding. Vascular/Lymphatic: Atherosclerotic nonaneurysmal abdominal aorta. No pathologically enlarged lymph nodes in the abdomen or pelvis. Reproductive: Newly mildly enlarged, irregular and poorly marginated prostate, directly contiguous with the bladder trigone. Fiducial markers noted in the prostate as before. Other: No pneumoperitoneum,  ascites or focal fluid collection. Solid subcutaneous 2.9 cm lateral right chest wall mass (series 2/image 29), new. Similar solid subcutaneous 4.4 cm left gluteal mass (series 2/image 63). Small fat  containing umbilical hernia is stable. Musculoskeletal: Numerous new sclerotic osseous lesions throughout the visualized skeleton including multiple lower ribs bilaterally, multiple thoracolumbar vertebrae largest at T11 and throughout the bilateral pelvic girdle, most prominent in the left iliac crest (series 2/image 60). Marked lumbar spondylosis. IMPRESSION: 1. Numerous new solid pulmonary nodules and masses at both lung bases, largest 5.2 cm in the basilar right lower lobe, compatible with pulmonary metastases. 2. New widespread sclerotic osseous metastases throughout the visualized skeleton as detailed. 3. Multiple new hypodense liver masses compatible with liver metastases. 4. Newly mildly enlarged, irregular and poorly marginated prostate, directly contiguous with the bladder trigone, suggestive of recurrent locally advanced prostate cancer. 5. Moderate bilateral hydroureteronephrosis to the level of the UVJ bilaterally, probably due to bladder outlet obstruction despite the well-positioned Foley catheter in the bladder. 6. Multiple layering bladder stones in the distended bladder. 7. Cholelithiasis. 8. Stable left adrenal adenoma. 9. Aortic Atherosclerosis (ICD10-I70.0). Electronically Signed   By: Ilona Sorrel M.D.   On: 02/13/2020 10:06   US Abdomen Limited RUQ  Result Date: 02/17/2020 CLINICAL DATA:  74 year old with history of prostate cancer. Suspicious liver lesions on noncontrast CT. EXAM: ULTRASOUND ABDOMEN LIMITED RIGHT UPPER QUADRANT COMPARISON:  CT renal stone protocol 02/13/2020 FINDINGS: Gallbladder: Echogenic stone at the base of the gallbladder measuring up to 1.0 cm. Common bile duct: Diameter: 0.5 cm Liver: Liver parenchyma is mildly heterogeneous. There are scattered hypoechoic liver  lesions that correspond with the previous CT findings. Round hypoechoic lesion in the right hepatic lobe measures up to 2.5 cm. Round hypoechoic lesion along the inferior margin of the right hepatic lobe measures up to 2.3 cm. Large hypoechoic lesion along the posterior aspect of the liver measures up to 4.2 cm. Portal vein is patent on color Doppler imaging with normal direction of blood flow towards the liver. Other: Partially visualized right kidney demonstrates dilatation of the right renal collecting system and similar to the recent CT. IMPRESSION: 1. Hypoechoic liver lesions. Findings are suggestive for metastatic disease. Patient is scheduled for ultrasound-guided core biopsy of a hepatic lesion. 2. Cholelithiasis. No evidence for gallbladder inflammation or biliary dilatation. Electronically Signed   By: Markus Daft M.D.   On: 02/17/2020 09:33    Assessment and plan-   #Castration resistant metastatic prostate cancer CT images are consistent with rapid progression of the metastatic prostate cancer. PSA was not much elevated, at 6. Ultrasound-guided liver biopsy result showed metastatic high-grade carcinoma. Discussed with pathologist, carcinoma was negative for PSA, the IHC profile excludes urothelial carcinoma. Pathologist feels that it is possible that the PSA expression can be lost in metastatic prostate carcinoma transforming to large cell/neuroendocrine features.  Additional IHC staining is pending. I discussed with the patient and wife, also talked to her daughter Giammarino over the phone.  His condition is not curable.  Stage IV, overall he is prognosis is poor Goals of care, patient has decided to go for comfort/hospice. CODE STATUS has been changed to DNR/DNI.  #Obstructive nephropathy, Foley does not doing well. Discussed with urologist Dr. Erlene Quan and Dr. [name], PCN is preferred option than suprapubic catheter. Patient declined PCN tube placement. I had a lengthy discussion with  patient, and his wife and daughter and encourage patient to request consider PCN.  Patient agreed with proceeding with PCN. Urologists and hospitalist were updated.  Thank you for allowing me to participate in the care of this patient.   Earlie Server, MD, PhD Hematology Oncology [place] at [facility]  Pager- [phone] 02/22/2020

## 2020-02-22 NOTE — Progress Notes (Signed)
PROGRESS NOTE    Timothy Maddox  YTK:[id number] DOB: 1946/04/07 DOA: 02/13/2020 PCP: [name], MD   Brief Narrative: Taken from H&P Timothy Maddox is a 74 y.o. 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  Pt has a h/o prostate CA, urethral stricture. He is s/p of recent cystoscopy and foley placement by Dr. [name] on 02/07/20. On arrival he was hypotensive which improved with IV fluids, UA looks infected, COVID-19 negative, worsening renal function.  CT renal stone study was concerning for multiple mets involving lung, osseous mets and liver.  Moderate bilateral hydronephrosis to the level of UVJ bilaterally and multiple layering bladder stones surrounding the Foley catheter tip. CTA was obtained due to elevated D-dimer and came back positive for PE, without any CT evidence of right heart strain, patient was started on IV heparin. Developed new onset A. fib with RVR-started on Cardizem infusion, overnight converted back to sinus rhythm and Cardizem infusion was discontinued today.  Cardiology was also consulted.  Subjective: Foley clogged off again, >1L retained in bladder.  Multiple irrigation attempts by nurse and urology unsuccessful.  Finally, urology exchanged another Foley and drained 1L for some relief.   Group discussion amongst all involved going on throughout the day, and decision was made to place perc nephrostomy tubes for palliative purpose.  Pt finally agreed to it at the end of the day, so procedure ordered for tomorrow.   Assessment & Plan:   Principal Problem:   Urinary tract infection associated with indwelling urethral catheter (HCC) Active Problems:   Stroke ([place])   Seizures (HCC)   Prostate cancer (HCC)   Hematuria   Hypotension   Hypertension   Type II diabetes mellitus with renal manifestations (HCC)    AKI (acute kidney injury) (HCC)   Chronic diastolic CHF (congestive heart failure) (HCC)   Atrial fibrillation with RVR (HCC)   PE (pulmonary thromboembolism) (HCC)   Liver mass   Palliative care encounter   Iron deficiency anemia due to chronic blood loss   Acute hypoxemic respiratory failure ([place])   Obstructive uropathy  Comfort care status and measures --Palliative provider discussed with pt and family, and decision was made for comfort care on 10/19 PLAN: --plan to discharge to hospice facility --morphine and ativan ordered --Perc nephrostomy tubes tomorrow with IR for palliative purpose (only way to drain urine at this point)  Acute hypoxic respiratory failure --While working with PT to ambulate, O2 sat noted to drop to 84% on RA. --CXR 10/16 no acute finding except for low lung volumes and known mets. --Trial Lasix 40 mg x1  --CXR 10/18 again showed no acute process.  Respiratory decompensation likely due to cancer mets in lungs and PE. PLAN: --cont suppl O2 for comfort  Acute urinary retention while on Foley --Foley clogged, new Foley placed on 10/17 with return of 1300 ml urine.  Next day, Foley clogged again.  After irrigation, 1859ml of dark bloody urine with clots drained. --Urology eval 10/18, can not upsize Foley due to stricture --Foley clogged again on 10/20 despite q4h irrigation. PLAN: --Perc nephrostomy tubes tomorrow with IR for palliative purpose  Leukocytosis --WBC has been trending up.  No fever. --no further workup since pt is now comfort care.  Complicated UTI/hydronephrosis/multiple bladder stones.  Recent cystoscopy.  Blood cultures negative so far.  Urinary cultures pos for Staphylococcus epidermidis,  questionable significance.   --completed a course of ceftriaxone 5 days + Keflex 2 days.  # Penile pain and likely Urethral injury After pulling on Foley catheter.  Pain improved.  Prostate cancer with mets Patient was waiting for PET scan for  concern of recurrence of bladder cancer.  CT renal stone study and CTA are concerning for multiple mets including lungs, liver and bones. --Oncology consulted, Dr. 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 PLAN: --decision made for comfort care.  New onset A. fib with RVR.   Most likely secondary to PE.  Cardiology was consulted and he was started on Cardizem infusion.  --initiated amiodarone oral loading dose per cardiology.  --HR went up to 110-140's Afib evening 10/13, so started amiodarone gtt, per cardiology rec.  Amiodarone gtt was stopped early this morning because night-team felt pt's HR was already down to 60's.   --another episode of Afib RVR night of 10/15 PLAN: --cont amiodarone oral 400 mg BID --cont metop 50 mg BID --stop anticoagulation due to comfort care status  Acute Pulmonary embolism.   CTA was done due to elevated D-dimer above 4000.  It was positive for PE.  Patient is high risk secondary to extensive malignancy.  Started on heparin infusion and then transitioned to Eliquis.  anticoagulation d/c'ed due to comfort care status  Anemia, iron def Hemoglobin dropped to 10.8 from 12.3.  Most likely secondary to hematuria.  Anemia panel consistent with mild iron deficiency along with anemia of chronic disease.   --iron suppl d/c'ed due to comfort care status  History of CVA.   No acute concern. -d/c Lipitor due to comfort care status  History of seizure disorder. -Continue home dose of Keppra. -As needed Ativan  Hypertension.   Blood pressure within goal today. -Continue metoprolol. -Holding home dose of spironolactone and Hyzaar.  AKI, worsened  CKD stage IIIa.   Bilateral hydronephrosis, POA Baseline creatinine around 1.2-1.6.  Stable today around 1.73.  Patient appears dehydrated and hypotensive on admission, s/p MIVF --Cr worsened to 3.45 and then 4.06 this morning, pt found to have urinary retention while on  Foley --renal US showed persistent bilateral hydronephrosis PLAN: --Ensure urine drainage  Chronic diastolic heart failure.  Prior echo done in 2017 with normal EF and grade 1 diastolic dysfunction.  Patient appears euvolemic. PLAN: -Holding home dose of spironolactone. -Continue metoprolol  Type II diabetes mellitus with renal manifestations [facility]): Recent A1c 6.3, well controlled.  Patient taking Metformin at home. --BG has been within goal for inpatient, and pt hasn't required more than 1 or 2 units of insulin per day --no need for fingersticks  Abdominal distention --KUB showed increased bowel gas, but no stool burden, nor bowel obstruction --simethicone PRN   Objective: Vitals:   02/22/20 0328 02/22/20 0400 02/22/20 0500 02/22/20 0749  BP: 108/62 112/65  126/69  Pulse: 72 71  81  Resp: (!) 28 (!) 29  (!) 30  Temp: 98.8 F (37.1 C)   98 F (36.7 C)  TempSrc: Oral   Oral  SpO2: 94%   93%  Weight: 116.2 kg  116.2 kg   Height:        Intake/Output Summary (Last 24 hours) at 02/22/2020 2003 Last data filed at 02/22/2020 1751 Gross per 24 hour  Intake 240 ml  Output 2400 ml  Net -2160 ml   Filed Weights   02/21/20 0445 02/22/20 0328 02/22/20 0500  Weight: 117 kg 116.2 kg 116.2 kg    Examination:  Constitutional: NAD, AAOx3, calm, subdued  HEENT: conjunctivae and lids normal, EOMI CV: No cyanosis.   RESP: shallow breaths, increased RR, on 3L Extremities: No effusions, edema in BLE SKIN: warm, dry and intact Neuro: II - XII grossly intact.   Psych: depressed mood and affect.    Foley cath present, bloody urine   DVT prophylaxis: none due to comfort care Code Status: DNR Family Communication: wife and daughter updated at bedside today Disposition Plan:  Status is: Inpatient  The patient is from: home Anticipated d/c is to: hospice facility Anticipated d/c date is: can be discharged after Perc nephrostomy tubes placed tomorrow   Consultants:    Urology  Cardiology  Procedures:  Antimicrobials:  Ceftriaxone  Data Reviewed: I have personally reviewed following labs and imaging studies  CBC: Recent Labs  Lab 02/17/20 0350 02/18/20 0435 02/19/20 0319 02/20/20 0257 02/21/20 0422  WBC 10.3 11.4* 15.0* 18.2* 15.4*  HGB 10.0* 9.4* 10.1* 10.6* 9.4*  HCT 29.2* 28.6* 30.4* 32.1* 29.0*  MCV 88.5 89.9 88.9 90.9 90.6  PLT 292 230 275 304 814   Basic Metabolic Panel: Recent Labs  Lab 02/17/20 0350 02/17/20 0350 02/17/20 2333 02/18/20 0435 02/19/20 0319 02/20/20 0257 02/21/20 0422  NA 137  --   --  133* 135 135 136  K 4.0   < > 4.0 4.0 5.0 5.1 5.0  CL 106  --   --  103 104 102 105  CO2 20*  --   --  19* 16* 17* 18*  GLUCOSE 140*  --   --  158* 138* 136* 135*  BUN 40*  --   --  39* 60* 78* 88*  CREATININE 1.93*  --   --  1.72* 3.45* 4.06* 2.98*  CALCIUM 9.1  --   --  9.0 9.1 9.4 9.1  MG 2.2   < > 1.9 2.5* 2.6* 2.7* 2.7*   < > = values in this interval not displayed.   GFR: Estimated Creatinine Clearance: 29.5 mL/min (A) (by C-G formula based on SCr of 2.98 mg/dL (H)). Liver Function Tests: No results for input(s): AST, ALT, ALKPHOS, BILITOT, PROT, ALBUMIN in the last 168 hours. No results for input(s): LIPASE, AMYLASE in the last 168 hours. No results for input(s): AMMONIA in the last 168 hours. Coagulation Profile: Recent Labs  Lab 02/17/20 0350  INR 1.2   Cardiac Enzymes: No results for input(s): CKTOTAL, CKMB, CKMBINDEX, TROPONINI in the last 168 hours. BNP (last 3 results) No results for input(s): PROBNP in the last 8760 hours. HbA1C: No results for input(s): HGBA1C in the last 72 hours. CBG: Recent Labs  Lab 02/15/20 2053 02/20/20 2053  GLUCAP 163* 139*   Lipid Profile: No results for input(s): CHOL, HDL, LDLCALC, TRIG, CHOLHDL, LDLDIRECT in the last 72 hours. Thyroid Function Tests: No results for input(s): TSH, T4TOTAL, FREET4, T3FREE, THYROIDAB in the last 72 hours. Anemia Panel: No  results for input(s): VITAMINB12, FOLATE, FERRITIN, TIBC, IRON, RETICCTPCT in the last 72 hours. Sepsis Labs: Recent Labs  Lab 02/19/20 0319  PROCALCITON 0.75    Recent Results (from the past 240 hour(s))  Urine culture     Status: Abnormal   Collection Time: 02/13/20  8:46 AM   Specimen: Urine, Catheterized  Result Value Ref Range Status   Specimen Description   Final    URINE, CATHETERIZED Performed at [facility], [address], [place], [place] [address]    Special Requests   Final    NONE Performed at  [facility], [place], [place] [address]    Culture 50,000 COLONIES/mL STAPHYLOCOCCUS EPIDERMIDIS (A)  Final   Report Status 02/15/2020 FINAL  Final   Organism ID, Bacteria STAPHYLOCOCCUS EPIDERMIDIS (A)  Final      Susceptibility   Staphylococcus epidermidis - MIC*    CIPROFLOXACIN >=8 RESISTANT Resistant     GENTAMICIN <=0.5 SENSITIVE Sensitive     NITROFURANTOIN <=16 SENSITIVE Sensitive     OXACILLIN <=0.25 SENSITIVE Sensitive     TETRACYCLINE 2 SENSITIVE Sensitive     VANCOMYCIN 1 SENSITIVE Sensitive     TRIMETH/SULFA >=320 RESISTANT Resistant     CLINDAMYCIN >=8 RESISTANT Resistant     RIFAMPIN <=0.5 SENSITIVE Sensitive     Inducible Clindamycin NEGATIVE Sensitive     * 50,000 COLONIES/mL STAPHYLOCOCCUS EPIDERMIDIS  Resp Panel by RT PCR (RSV, Flu A&B, Covid) - Nasopharyngeal Swab     Status: None   Collection Time: 02/13/20  9:59 AM   Specimen: Nasopharyngeal Swab  Result Value Ref Range Status   SARS Coronavirus 2 by RT PCR NEGATIVE NEGATIVE Final    Comment: (NOTE) SARS-CoV-2 target nucleic acids are NOT DETECTED.  The SARS-CoV-2 RNA is generally detectable in upper respiratoy specimens during the acute phase of infection. The lowest concentration of SARS-CoV-2 viral copies this assay can detect is 131 copies/mL. A negative result does not preclude SARS-Cov-2 infection and should not be used as the sole basis for  treatment or other patient management decisions. A negative result may occur with  improper specimen collection/handling, submission of specimen other than nasopharyngeal swab, presence of viral mutation(s) within the areas targeted by this assay, and inadequate number of viral copies (<131 copies/mL). A negative result must be combined with clinical observations, patient history, and epidemiological information. The expected result is Negative.  Fact Sheet for Patients:  PinkCheek.be  Fact Sheet for Healthcare Providers:  GravelBags.it  This test is no t yet approved or cleared by the [place] FDA and  has been authorized for detection and/or diagnosis of SARS-CoV-2 by FDA under an Emergency Use Authorization (EUA). This EUA will remain  in effect (meaning this test can be used) for the duration of the COVID-19 declaration under Section 564(b)(1) of the Act, 21 U.S.C. section 360bbb-3(b)(1), unless the authorization is terminated or revoked sooner.     Influenza A by PCR NEGATIVE NEGATIVE Final   Influenza B by PCR NEGATIVE NEGATIVE Final    Comment: (NOTE) The Xpert Xpress SARS-CoV-2/FLU/RSV assay is intended as an aid in  the diagnosis of influenza from Nasopharyngeal swab specimens and  should not be used as a sole basis for treatment. Nasal washings and  aspirates are unacceptable for Xpert Xpress SARS-CoV-2/FLU/RSV  testing.  Fact Sheet for Patients: PinkCheek.be  Fact Sheet for Healthcare Providers: GravelBags.it  This test is not yet approved or cleared by the [place] FDA and  has been authorized for detection and/or diagnosis of SARS-CoV-2 by  FDA under an Emergency Use Authorization (EUA). This EUA will remain  in effect (meaning this test can be used) for the duration of the  Covid-19 declaration under Section 564(b)(1) of the Act, 21   U.S.C. section 360bbb-3(b)(1), unless the authorization is  terminated or revoked.    Respiratory Syncytial Virus by PCR NEGATIVE NEGATIVE Final    Comment: (NOTE) Fact Sheet for Patients: PinkCheek.be  Fact Sheet for Healthcare Providers: GravelBags.it  This test is not yet approved or cleared by the [place] and  has been authorized for detection and/or diagnosis of SARS-CoV-2 by  FDA under an Emergency Use Authorization (EUA). This EUA will remain  in effect (meaning this test can be used) for the duration of the  COVID-19 declaration under Section 564(b)(1) of the Act, 21 U.S.C.  section 360bbb-3(b)(1), unless the authorization is terminated or  revoked. Performed at [facility], [address] [place], [place] [address]   Culture, blood (Routine X 2) w Reflex to ID Panel     Status: None   Collection Time: 02/13/20 10:22 AM   Specimen: BLOOD  Result Value Ref Range Status   Specimen Description BLOOD LEFT HAND  Final   Special Requests   Final    BOTTLES DRAWN AEROBIC AND ANAEROBIC Blood Culture results may not be optimal due to an inadequate volume of blood received in culture bottles   Culture   Final    NO GROWTH 5 DAYS Performed at [facility], [address], [place], [place] [address]    Report Status 02/18/2020 FINAL  Final  Culture, blood (Routine X 2) w Reflex to ID Panel     Status: None   Collection Time: 02/13/20 10:30 AM   Specimen: BLOOD  Result Value Ref Range Status   Specimen Description BLOOD LEFT ARM  Final   Special Requests   Final    BOTTLES DRAWN AEROBIC AND ANAEROBIC Blood Culture adequate volume   Culture   Final    NO GROWTH 5 DAYS Performed at [facility], [address], [place], [place] [address]    Report Status 02/18/2020 FINAL  Final  Urine Culture     Status: None   Collection Time: 02/20/20  5:17 PM   Specimen: Urine, Random   Result Value Ref Range Status   Specimen Description   Final    URINE, RANDOM Performed at [facility] [place], [address] [place], [place] [address]    Special Requests   Final    NONE Performed at [facility], [address] [place], [place] [address]    Culture   Final    NO GROWTH Performed at [facility], [place] [address] [place], [place] [address]    Report Status 02/22/2020 FINAL  Final     Radiology Studies: No results found.  Scheduled Meds: . amiodarone  200 mg Oral BID  . Chlorhexidine Gluconate Cloth  6 each Topical Daily  . levETIRAcetam  500 mg Oral BID  . metoprolol tartrate  50 mg Oral BID  . senna-docusate  2 tablet Oral QHS   Continuous Infusions:    LOS: 9 days    Enzo Bi, MD [facility]  If 7PM-7AM, please contact night-coverage [email or web address]  02/22/2020, 8:03 PM

## 2020-02-22 NOTE — Progress Notes (Signed)
[facility]) Hospital Liaison RN note:  Visited with patient and spouse, Holley Raring in room. Patient is resting on his side. He states he has some pain especially his back. Holley Raring has questions about his catheter clotting off and what might be done. Spoke with RN Lysbeth Galas. She will get patient some pain med and nausea medications. Plan may be for urology to place a suprapubic cath.   Hospice Home does not have a bed to offer today. Family and hospital care team is aware. [place] Liaison will follow for room availability.  Please call with any hospice related questions or concerns.  Thank you.  Zandra Abts, RN [facility] Liaison [phone]

## 2020-02-22 NOTE — Progress Notes (Addendum)
I have seen and examined the patient, labs/ imaging reviewed and discussed  management with Aldona Bar Vaillancourt/ Zara Council. I reviewed the PA's note and agree with the documented findings and plan of care.  I was personally involved in the patient's care throughout the day in contact with the hospitalist, Dr. Billie Ruddy, Dr. [name] and nursing staff.  Throughout the day, his catheter was nondraining ultimately bladder scan indicated significantly elevated PVRs.  Debroah Loop, PA-C, attempted to exchange the catheter in the setting of clot retention but this catheter can also irrigate.  I personally returned later this evening and using standard sterile technique, removed his previous catheter which time he had a fairly large bladder spasm.  And then used a lighted jet using standard sterile technique and was ultimately able to introduce a 20 [place] three-way coud tip catheter into the bladder.  The bladder immediately drained almost a liter of bloody urine.  Unfortunately, despite adequate position within the bladder, this catheter although draining, did not aspirate and is not able to evacuate any clot highly suggestive of well organized clot.  He also had conversations with Dr. [name] throughout the day as well.  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  It is likely that his suprapubic catheter would also clot and CBI is contraindicated given the tenuous state of the current catheter and inability to evacuate clot burden.  Lengthy conversation with the patient, his wife, daughter and granddaughter at bedside today.  Ultimately, we discussed palliation of pain, discharged with hospice and likely progression to end-stage renal disease in the very prompt future in the setting of clot retention.  Alternatively, urinary diversion with bilateral PCNs was discussed.  He previously declined this but understanding the natural  history of his current situation prompted him to want to consider these.  He is currently off of anticoagulation and will remain n.p.o. for bilateral nephrostomy tubes for seeing in the morning.  Likely, his bladder is currently drained and is more comfortable for overnight.  This extremely complex situation and I spent greater than 45 minutes today manipulating his catheter, coordinating care with his other physicians as well as time counseling the family.  Hollice Espy, MD      Urology Consult Follow Up  Subjective: Patient resting in bed.  Patient having lower back pain which is muscle skeletal in nature due to his prolonged bedrest.  He states they are giving him medicine for pain, but he states the pain medication causes nausea.  He denies any bladder pain.    Afebrile, tachypneic  Foley catheter continues to drain cherry red urine     Anti-infectives: Anti-infectives (From admission, onward)   Start     Dose/Rate Route Frequency Ordered Stop   02/19/20 2200  cephALEXin (KEFLEX) capsule 500 mg  Status:  Discontinued        500 mg Oral Every 8 hours 02/19/20 1607 02/20/20 1614   02/18/20 0600  cephALEXin (KEFLEX) capsule 500 mg  Status:  Discontinued        500 mg Oral Every 6 hours 02/17/20 1252 02/19/20 1607   02/13/20 1000  ceFEPIme (MAXIPIME) 2 g in sodium chloride 0.9 % 100 mL IVPB  Status:  Discontinued        2 g 200 mL/hr over 30 Minutes Intravenous  Once 02/13/20 0942 02/13/20 0953   02/13/20 1000  cefTRIAXone (ROCEPHIN) 2 g in sodium chloride 0.9 % 100 mL  IVPB  Status:  Discontinued        2 g 200 mL/hr over 30 Minutes Intravenous Every 24 hours 02/13/20 0954 02/17/20 1252      Current Facility-Administered Medications  Medication Dose Route Frequency Provider Last Rate Last Admin  . amiodarone (PACERONE) tablet 200 mg  200 mg Oral BID Adaline Sill, NP   200 mg at 02/22/20 0040  . Chlorhexidine Gluconate Cloth 2 % PADS 6 each  6 each Topical Daily Ivor Costa, MD   6 each at 02/21/20 1015  . hydrALAZINE (APRESOLINE) injection 5 mg  5 mg Intravenous Q2H PRN Ivor Costa, MD      . levETIRAcetam (KEPPRA) tablet 500 mg  500 mg Oral BID Ivor Costa, MD   500 mg at 02/22/20 0040  . LORazepam (ATIVAN) injection 0.5 mg  0.5 mg Intravenous Q12H PRN Ivor Costa, MD   0.5 mg at 02/22/20 0057  . LORazepam (ATIVAN) injection 0.5-1 mg  0.5-1 mg Intravenous Q2H PRN Ivor Costa, MD      . metoprolol tartrate (LOPRESSOR) tablet 50 mg  50 mg Oral BID Ivor Costa, MD   50 mg at 02/22/20 0040  . morphine CONCENTRATE 10 MG/0.5ML oral solution 5 mg  5 mg Oral Q2H PRN Enzo Bi, MD       Or  . morphine CONCENTRATE 10 MG/0.5ML oral solution 5 mg  5 mg Sublingual Q2H PRN Enzo Bi, MD      . senna-docusate (Senokot-S) tablet 2 tablet  2 tablet Oral QHS Enzo Bi, MD   2 tablet at 02/18/20 2129  . simethicone (MYLICON) chewable tablet 80 mg  80 mg Oral QID PRN Enzo Bi, MD   80 mg at 02/18/20 1329  . sodium chloride flush (NS) 0.9 % injection 3 mL  3 mL Intravenous PRN Lorella Nimrod, MD   3 mL at 02/14/20 2314     Objective: Vital signs in last 24 hours: Temp:  [97.5 F (36.4 C)-98.8 F (37.1 C)] 98 F (36.7 C) (10/20 0749) Pulse Rate:  [64-87] 81 (10/20 0749) Resp:  [22-31] 30 (10/20 0749) BP: (108-136)/(58-72) 126/69 (10/20 0749) SpO2:  [93 %-96 %] 93 % (10/20 0749) Weight:  [116.2 kg] 116.2 kg (10/20 0500)  Intake/Output from previous day: 10/19 0701 - 10/20 0700 In: 874.1 [P.O.:480; I.V.:94.1] Out: 3225 [Urine:3225] Intake/Output this shift: No intake/output data recorded.   Physical Exam Constitutional:  Well nourished. Alert and oriented, No acute distress. HEENT: [place] AT, moist mucus membranes.  Trachea midline Cardiovascular: No clubbing, cyanosis, or edema. Respiratory: Increased respiratory effort, increased work of breathing. GI: Abdomen is soft, non tender, non distended, no abdominal masses. PJ:KDTOI in place draining cherry red  urine Neurologic: Grossly intact, no focal deficits, moving all 4 extremities. Psychiatric: Normal mood and affect.  Lab Results:  Recent Labs    02/20/20 0257 02/21/20 0422  WBC 18.2* 15.4*  HGB 10.6* 9.4*  HCT 32.1* 29.0*  PLT 304 329   BMET Recent Labs    02/20/20 0257 02/21/20 0422  NA 135 136  K 5.1 5.0  CL 102 105  CO2 17* 18*  GLUCOSE 136* 135*  BUN 78* 88*  CREATININE 4.06* 2.98*  CALCIUM 9.4 9.1   PT/INR No results for input(s): LABPROT, INR in the last 72 hours. ABG No results for input(s): PHART, HCO3 in the last 72 hours.  Invalid input(s): PCO2, PO2  Studies/Results: CT PELVIS WO CONTRAST  Result Date: 02/20/2020 CLINICAL DATA:  Urinary retention, prostate cancer,  urethral stricture EXAM: CT PELVIS WITHOUT CONTRAST TECHNIQUE: Multidetector CT imaging of the pelvis was performed following the standard protocol without intravenous contrast. COMPARISON:  02/13/20. FINDINGS: Urinary Tract: Foley catheter balloon is again seen within the bladder lumen. Despite this, the bladder appears mildly distended, raising the question of obstruction of the catheter. There are numerous layering calcifications within the bladder lumen, slightly improved from prior examination but persistent. Additionally, a small amount of calcific debris is seen within the Foley catheter lumen suggesting partial obstruction secondary to debris. Gas is seen within the bladder lumen related to probable catheterization. There is mild perivesicular inflammatory stranding, similar to prior examination, likely infectious or inflammatory in nature. There is again noted mild right and moderate left hydroureter which appears similar to that seen on prior examination suggesting obstruction of the ureters bilaterally at the ureterovesicular junctions. Locally invasive prostatic mass with invasion of the bladder trigone is again noted. Brachytherapy seeds are seen within the prostate gland. Bowel: Small  broad-based umbilical hernia is again identified containing a single wall of the otherwise unremarkable mid transverse colon. The visualized large and small bowel are otherwise unremarkable. Appendix normal. No free fluid within the pelvis. Vascular/Lymphatic: Extensive aortoiliac atherosclerotic calcification. No aortic aneurysm. No pathologic adenopathy within the pelvis. Reproductive: Microlobulated prostatic mass invades the base of the bladder as described above. This is similar in appearance to prior examination. Other: Small fat containing left inguinal hernia noted. Rectum unremarkable. Musculoskeletal: Sclerotic metastases are again identified within the visualized axial skeleton in keeping with osseous metastatic disease. Similarly, 4.7 cm soft tissue mass is seen within the subcutaneous fat of the left gluteal region in keeping with a soft tissue metastasis. IMPRESSION: Stable examination. Prostatic mass invades the base of the bladder with resultant obstruction of the ureterovesicular junctions bilaterally and mild-to-moderate hydroureter noted bilaterally. Foley catheter balloon remains within the bladder lumen, however, mild bladder distension persists and calcific debris within the catheter lumen suggests partial obstruction due to debris. Layering bladder calculi again noted, slightly improved from prior examination possibly related to fragmentation. Perivesicular inflammatory stranding suggesting persistent infectious or inflammatory cystitis. Sclerotic osseous and subcutaneous soft tissue metastatic disease again identified. Aortic Atherosclerosis (ICD10-I70.0). Electronically Signed   By: Fidela Salisbury MD   On: 02/20/2020 16:05   DG Chest Port 1 View  Result Date: 02/20/2020 CLINICAL DATA:  Dyspnea EXAM: PORTABLE CHEST 1 VIEW COMPARISON:  02/18/2020, CT 02/13/2020 FINDINGS: Multiple bilateral lung nodules. No acute consolidation or effusion. Enlarged cardiomediastinal silhouette. Bilateral  hilar enlargement likely due to nodes. No pneumothorax. IMPRESSION: 1. No acute airspace disease. 2. Bilateral pulmonary nodules/masses and hilar enlargement, consistent with metastatic disease. Electronically Signed   By: Donavan Foil M.D.   On: 02/20/2020 18:12     Assessment and Plan: 74 year old male with metastatic prostate cancer status post cystoscopy with urethral dilation with Dr. [name] on February 07, 2020 who was admitted for management of gross hematuria and found to have bilateral PEs, now on anticoagulation with concern for clot retention and AKI  Palliative care has been discussed with patient and family and decision was made for comfort care on October 19.  He is currently awaiting discharge to a hospice facility.    Recommendations: Continue Foley irrigation with 60 cc of sterile saline every 4 hours to keep catheter draining Discharge pending at this time to hospice facility   LOS: 9 days    [facility] 02/22/2020

## 2020-02-22 NOTE — Progress Notes (Addendum)
Around 2 PM this afternoon, I evaluated the patient based on nursing reports of a nondraining Foley catheter.  I was able to flush 60 cc of sterile water into the patient's bladder through the Foley in place, however I was unable to pull back on the irrigation syringe.  Due to concerns for clot occlusion, I elected to exchange the patient's Foley catheter at the bedside.  I initially attempted to place on 18 French silicone catheter, however I was unable to navigate the straight tip past the level of the prostate.  I then successfully placed an 53 French coud Foley catheter.  Notably, patient had a robust bladder spasm with removal of his existing Foley with passage of approximately 150 mL of dark red urine.  I attempted to irrigate the new 45 French coud Foley catheter but remained unable to do so.  I had a brief conversation with the patient and his wife at the bedside today explaining that this remains an extremely challenging situation.  I am concerned regarding the ability to place a sufficiently large suprapubic catheter to drain what is likely a well-organized clot in the bladder and explained that this would not solve the underlying problem of his gross hematuria and clot retention and may likely become occluded itself.  Patient at this time continued to refuse PCN placement.  I explained that I would discuss his case further with his care team and we would determine his best next steps.

## 2020-02-23 ENCOUNTER — Other Ambulatory Visit: Payer: Self-pay | Admitting: Interventional Radiology

## 2020-02-23 ENCOUNTER — Other Ambulatory Visit: Payer: Medicare HMO

## 2020-02-23 ENCOUNTER — Inpatient Hospital Stay: Payer: Medicare HMO

## 2020-02-23 ENCOUNTER — Inpatient Hospital Stay: Payer: Medicare HMO | Admitting: Oncology

## 2020-02-23 DIAGNOSIS — N179 Acute kidney failure, unspecified: Secondary | ICD-10-CM | POA: Diagnosis not present

## 2020-02-23 DIAGNOSIS — N1330 Unspecified hydronephrosis: Secondary | ICD-10-CM

## 2020-02-23 DIAGNOSIS — R109 Unspecified abdominal pain: Secondary | ICD-10-CM

## 2020-02-23 DIAGNOSIS — R1084 Generalized abdominal pain: Secondary | ICD-10-CM

## 2020-02-23 DIAGNOSIS — I639 Cerebral infarction, unspecified: Secondary | ICD-10-CM

## 2020-02-23 DIAGNOSIS — N390 Urinary tract infection, site not specified: Secondary | ICD-10-CM | POA: Diagnosis not present

## 2020-02-23 DIAGNOSIS — I2699 Other pulmonary embolism without acute cor pulmonale: Secondary | ICD-10-CM | POA: Diagnosis not present

## 2020-02-23 DIAGNOSIS — C61 Malignant neoplasm of prostate: Secondary | ICD-10-CM | POA: Diagnosis not present

## 2020-02-23 HISTORY — PX: IR NEPHROSTOMY PLACEMENT LEFT: IMG6063

## 2020-02-23 HISTORY — PX: IR NEPHROSTOMY PLACEMENT RIGHT: IMG6064

## 2020-02-23 MED ORDER — CEFAZOLIN SODIUM-DEXTROSE 1-4 GM/50ML-% IV SOLN
INTRAVENOUS | Status: DC | PRN
  Administered 2020-02-23: 2 g via INTRAVENOUS

## 2020-02-23 MED ORDER — MAGIC MOUTHWASH W/LIDOCAINE
15.0000 mL | Freq: Three times a day (TID) | ORAL | Status: DC
  Administered 2020-02-23 – 2020-02-24 (×4): 15 mL via ORAL
  Filled 2020-02-23 (×6): qty 15

## 2020-02-23 MED ORDER — IODIXANOL 320 MG/ML IV SOLN
50.0000 mL | Freq: Once | INTRAVENOUS | Status: AC | PRN
  Administered 2020-02-23: 10 mL

## 2020-02-23 MED ORDER — MIDAZOLAM HCL 2 MG/2ML IJ SOLN
INTRAMUSCULAR | Status: AC
  Filled 2020-02-23: qty 2

## 2020-02-23 MED ORDER — FENTANYL CITRATE (PF) 100 MCG/2ML IJ SOLN
INTRAMUSCULAR | Status: AC
  Filled 2020-02-23: qty 2

## 2020-02-23 MED ORDER — FENTANYL CITRATE (PF) 100 MCG/2ML IJ SOLN
INTRAMUSCULAR | Status: DC | PRN
Start: 2020-02-23 — End: 2020-02-25
  Administered 2020-02-23: 25 ug via INTRAVENOUS

## 2020-02-23 MED ORDER — SODIUM CHLORIDE 0.9% FLUSH
5.0000 mL | Freq: Three times a day (TID) | INTRAVENOUS | Status: DC
  Administered 2020-02-23 – 2020-02-24 (×3): 5 mL

## 2020-02-23 NOTE — Progress Notes (Signed)
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  Pt has a h/o prostate CA, urethral stricture. He iss/pofrecent cystoscopy and foley placementbyDr. Stoioff on 02/07/20. On arrival he was hypotensive which improved with IV fluids, UA looks infected, COVID-19 negative, worsening renal function.  CT renal stone study was concerning for multiple mets involving lung, osseous mets and liver.  Moderate bilateral hydronephrosis to the level of UVJ bilaterally and multiple layering bladder stones surrounding the Foley catheter tip. CTA was obtained due to elevated D-dimer and came back positive for PE, without any CT evidence of right heart strain, patient was started on IV heparin. Developed new onset A. fib with RVR-started on Cardizem infusion, overnight converted back to sinus rhythm and Cardizem infusion was discontinued. Oncology was also consulted and due to rapid disease progression patient was transitioned to hospice/comfort measures. Percutaneous drain to be placed by IR today for recurrent urinary retention. Patient can be discharged to hospice home after procedure-currently no bed availability.  Subjective: Patient was feeling thirsty when seen today.  Asking for some water.  He was n.p.o. for percutaneous drain placed by IR later today.  Accompanied by daughter and wife in the room.  Offered ice chips.  Assessment & Plan:   Principal Problem:   Urinary tract infection associated with indwelling urethral catheter (HCC) Active Problems:   Stroke ([place])   Seizures ([place])   Prostate cancer (HCC)   Hematuria   Hypotension   Hypertension   Type II diabetes mellitus with renal  manifestations (HCC)   AKI (acute kidney injury) ([place])   Chronic diastolic CHF (congestive heart failure) (HCC)   Atrial fibrillation with RVR (HCC)   PE (pulmonary thromboembolism) (HCC)   Liver mass   Palliative care encounter   Iron deficiency anemia due to chronic blood loss   Acute hypoxemic respiratory failure (HCC)   Obstructive uropathy   Clot retention of urine  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  Patient was transitioned to comfort care only. He can be discharged to hospice home after percutaneous drain was placed by IR for comfort measures only. -Continue with comfort measures. -Waiting for bed availability at hospice home.  Acute hypoxic respiratory failure.  Most likely secondary to worsening of metastatic lung disease along with PE. Anticoagulation was discontinued as patient was transitioned to comfort measures only. -Continue with supplemental oxygen.  Recurrent urinary retention while on Foley/worsening AKI with CKD stage IIIa/bilateral hydronephrosis, POA.  Due to recurrence of clogging of Foley catheter despite irrigation and changing.  After a long discussion with oncology and urology it was advised to have percutaneous drain placed for palliative purpose. -Patient is going for nephrostomy by IR today. -Although worsening creatinine but labs were discontinued as patient was transitioned to comfort measures only.  Complicated UTI/hydronephrosis/multiple bladder stones.  Recent cystoscopy.  Blood cultures negative so far.  Urinary cultures pos for Staphylococcus epidermidis, questionable significance.   --completed a course of ceftriaxone 5 days + Keflex 2 days.  # Penile pain and likely Urethral injury After pulling on Foley catheter.  Pain improved.  New onset A. fib with RVR.   Most likely secondary to PE.  Cardiology was consulted and he was  started on Cardizem infusion.  Later Cardizem infusion was discontinued and he  was given a bolus of amiodarone along with infusion.  After rate control switch to p.o. amiodarone. -Continue with p.o. amiodarone. -Continue with metoprolol. -No need for anticoagulation as patient is comfort measures only.  Acute Pulmonary embolism.   CTA was done due to elevated D-dimer above 4000.  It was positive for PE.  Patient is high risk secondary to extensive malignancy.  Started on heparin infusion and then transitioned to Eliquis.  anticoagulation d/c'ed due to comfort care status  Anemia, iron def Hemoglobin dropped to 10.8 from 12.3.  Most likely secondary to hematuria.  Anemia panel consistent with mild iron deficiency along with anemia of chronic disease.   --iron suppl d/c'ed due to comfort care status  History of CVA.   No acute concern. -d/c Lipitor due to comfort care status  History of seizure disorder. -Continue home dose of Keppra. -As needed Ativan  Hypertension.   Blood pressure within goal today. -Continue metoprolol. -Holding home dose of spironolactone and Hyzaar.  Type II diabetes mellitus with renal manifestations (HCC):Recent A1c 6.3, well controlled. Patient taking Metformin at home. --BG has been within goal for inpatient, and pt hasn't required more than 1 or 2 units of insulin per day --no need for fingersticks  Abdominal distention --KUB showed increased bowel gas, but no stool burden, nor bowel obstruction --simethicone PRN  Chronic diastolic heart failure.  Prior echo done in 2017 with normal EF and grade 1 diastolic dysfunction.  Patient appears euvolemic. -Holding home dose of spironolactone. -Continue metoprolol for palliative purposes only.  Objective: Vitals:   02/23/20 1509 02/23/20 1513 02/23/20 1514 02/23/20 1515  BP: 114/71   122/61  Pulse: 86 84 83 83  Resp: (!) 31 20 (!) 24 (!) 24  Temp:    98.4 F (36.9 C)  TempSrc:    Axillary  SpO2: 93% 92% 92% 93%  Weight:      Height:        Intake/Output Summary (Last  24 hours) at 02/23/2020 1649 Last data filed at 02/23/2020 1644 Gross per 24 hour  Intake --  Output 1725 ml  Net -1725 ml   Filed Weights   02/21/20 0445 02/22/20 0328 02/22/20 0500  Weight: 117 kg 116.2 kg 116.2 kg    Examination:  General exam: Chronically ill-appearing, appears comfortable  Respiratory system: Clear to auscultation. Respiratory effort normal. Cardiovascular system: S1 & S2 heard, RRR. Gastrointestinal system: Soft, nontender, nondistended, bowel sounds positive. Central nervous system: Alert and oriented. No focal neurological deficits. Extremities: No edema, pulses intact and symmetrical. Psychiatry: Judgement and insight appear normal.    DVT prophylaxis: None.  Patient is on comfort measures only Code Status: DNR Family Communication: Wife and daughter were updated at bedside. Disposition Plan:  Status is: Inpatient  Remains inpatient appropriate because:Inpatient level of care appropriate due to severity of illness   Dispo: The patient is from: Home              Anticipated d/c is to: Hospice home              Anticipated d/c date is: 1 day              Patient currently is medically stable to d/c.  Consultants:   Oncology  Urology  IR  Cardiology  Procedures:  Antimicrobials:   Data Reviewed: I have personally reviewed following labs and imaging studies  CBC: Recent Labs  Lab 02/17/20  3295 02/18/20 0435 02/19/20 0319 02/20/20 0257 02/21/20 0422  WBC 10.3 11.4* 15.0* 18.2* 15.4*  HGB 10.0* 9.4* 10.1* 10.6* 9.4*  HCT 29.2* 28.6* 30.4* 32.1* 29.0*  MCV 88.5 89.9 88.9 90.9 90.6  PLT 292 230 275 304 188   Basic Metabolic Panel: Recent Labs  Lab 02/17/20 0350 02/17/20 0350 02/17/20 2333 02/18/20 0435 02/19/20 0319 02/20/20 0257 02/21/20 0422  NA 137  --   --  133* 135 135 136  K 4.0   < > 4.0 4.0 5.0 5.1 5.0  CL 106  --   --  103 104 102 105  CO2 20*  --   --  19* 16* 17* 18*  GLUCOSE 140*  --   --  158* 138* 136*  135*  BUN 40*  --   --  39* 60* 78* 88*  CREATININE 1.93*  --   --  1.72* 3.45* 4.06* 2.98*  CALCIUM 9.1  --   --  9.0 9.1 9.4 9.1  MG 2.2   < > 1.9 2.5* 2.6* 2.7* 2.7*   < > = values in this interval not displayed.   GFR: Estimated Creatinine Clearance: 29.5 mL/min (A) (by C-G formula based on SCr of 2.98 mg/dL (H)). Liver Function Tests: No results for input(s): AST, ALT, ALKPHOS, BILITOT, PROT, ALBUMIN in the last 168 hours. No results for input(s): LIPASE, AMYLASE in the last 168 hours. No results for input(s): AMMONIA in the last 168 hours. Coagulation Profile: Recent Labs  Lab 02/17/20 0350  INR 1.2   Cardiac Enzymes: No results for input(s): CKTOTAL, CKMB, CKMBINDEX, TROPONINI in the last 168 hours. BNP (last 3 results) No results for input(s): PROBNP in the last 8760 hours. HbA1C: No results for input(s): HGBA1C in the last 72 hours. CBG: Recent Labs  Lab 02/20/20 2053  GLUCAP 139*   Lipid Profile: No results for input(s): CHOL, HDL, LDLCALC, TRIG, CHOLHDL, LDLDIRECT in the last 72 hours. Thyroid Function Tests: No results for input(s): TSH, T4TOTAL, FREET4, T3FREE, THYROIDAB in the last 72 hours. Anemia Panel: No results for input(s): VITAMINB12, FOLATE, FERRITIN, TIBC, IRON, RETICCTPCT in the last 72 hours. Sepsis Labs: Recent Labs  Lab 02/19/20 0319  PROCALCITON 0.75    Recent Results (from the past 240 hour(s))  Urine Culture     Status: None   Collection Time: 02/20/20  5:17 PM   Specimen: Urine, Random  Result Value Ref Range Status   Specimen Description   Final    URINE, RANDOM Performed at [facility], [address], [place], [place] [address]    Special Requests   Final    NONE Performed at [facility], [address] [place], [place] [address]    Culture   Final    NO GROWTH Performed at [facility], [place] [address], [place], [place] [address]    Report Status 02/22/2020 FINAL  Final     Radiology  Studies: [place] Intraoperative  Result Date: 02/23/2020 CLINICAL DATA:  Ultrasound was provided for use by the ordering physician.  No provider Interpretation or professional fees incurred.    IR NEPHROSTOMY PLACEMENT LEFT  Result Date: 02/23/2020 CLINICAL DATA:  Widely metastatic prostate carcinoma and obstructive uropathy due to bleeding and clots at the level of the bladder leading to obstruction indwelling Foley catheters. Request has been made to place bilateral percutaneous nephrostomy tubes for urinary drainage. EXAM: 1. ULTRASOUND GUIDANCE FOR PUNCTURE OF THE LEFT RENAL COLLECTING SYSTEM. 2. LEFT PERCUTANEOUS NEPHROSTOMY TUBE  PLACEMENT. 3. ULTRASOUND GUIDANCE FOR PUNCTURE OF THE RIGHT RENAL COLLECTING SYSTEM. 4. RIGHT PERCUTANEOUS NEPHROSTOMY TUBE PLACEMENT. COMPARISON:  None. ANESTHESIA/SEDATION: 25 mcg IV Fentanyl. Total Moderate Sedation Time: 20 minutes. The patient's level of consciousness and physiologic status were continuously monitored during the procedure by Radiology nursing. CONTRAST:  10 ml Visipaque 320 MEDICATIONS: 2 g IV Ancef. Antibiotic was administered in an appropriate time frame prior to skin puncture. FLUOROSCOPY TIME:  1 minute and 54 seconds.  24.3 mGy. PROCEDURE: The procedure, risks, benefits, and alternatives were explained to the patient and his wife. Questions regarding the procedure were encouraged and answered. The patient's wife understands and consents to the procedure. A time-out was performed prior to initiating the procedure. Bilateral flank regions were prepped with chlorhexidine in a sterile fashion, and a sterile drape was applied covering the operative field. A sterile gown and sterile gloves were used for the procedure. Local anesthesia was provided with 1% Lidocaine. Ultrasound was used to localize the left kidney. Under direct ultrasound guidance, a 21 gauge needle was advanced into the renal collecting system. Ultrasound image documentation was performed.  Aspiration of urine sample was performed followed by contrast injection. A transitional dilator was advanced over a guidewire. Percutaneous tract dilatation was then performed over the guidewire. A 10-French percutaneous nephrostomy tube was then advanced and formed in the collecting system. Catheter position was confirmed by fluoroscopy after contrast injection. Ultrasound was used to localize the right kidney. Under direct ultrasound guidance, a 21 gauge needle was advanced into the collecting system. Ultrasound image documentation was performed. After aspiration of urine, contrast was injected. A transitional dilator was advanced. The tract was dilated and a 10 French percutaneous nephrostomy tube advanced over the wire and formed. Catheter positioning was confirmed by fluoroscopy after contrast injection. Both catheters were secured at the skin with Prolene retention sutures and Stat-Lock devices. Both were connected to gravity drainage bags. COMPLICATIONS: None. FINDINGS: Initial ultrasound demonstrates hydronephrosis of both kidneys, left greater than right. The left percutaneous nephrostomy tube was placed via upper pole access with the tube formed at the level of the renal pelvis. There is return of bloody urine after tube placement. The right percutaneous nephrostomy tube was placed via interpolar access and formed at the level of the renal pelvis. There is return of urine containing significant debris. IMPRESSION: Bilateral 10 French percutaneous nephrostomy tube placement utilizing ultrasound and fluoroscopy. Both tubes were formed at the level of the renal pelvis and connected to gravity bag drainage. Electronically Signed   By: Aletta Edouard M.D.   On: 02/23/2020 15:50   IR NEPHROSTOMY PLACEMENT RIGHT  Result Date: 02/23/2020 CLINICAL DATA:  Widely metastatic prostate carcinoma and obstructive uropathy due to bleeding and clots at the level of the bladder leading to obstruction indwelling Foley  catheters. Request has been made to place bilateral percutaneous nephrostomy tubes for urinary drainage. EXAM: 1. ULTRASOUND GUIDANCE FOR PUNCTURE OF THE LEFT RENAL COLLECTING SYSTEM. 2. LEFT PERCUTANEOUS NEPHROSTOMY TUBE PLACEMENT. 3. ULTRASOUND GUIDANCE FOR PUNCTURE OF THE RIGHT RENAL COLLECTING SYSTEM. 4. RIGHT PERCUTANEOUS NEPHROSTOMY TUBE PLACEMENT. COMPARISON:  None. ANESTHESIA/SEDATION: 25 mcg IV Fentanyl. Total Moderate Sedation Time: 20 minutes. The patient's level of consciousness and physiologic status were continuously monitored during the procedure by Radiology nursing. CONTRAST:  10 ml Visipaque 320 MEDICATIONS: 2 g IV Ancef. Antibiotic was administered in an appropriate time frame prior to skin puncture. FLUOROSCOPY TIME:  1 minute and 54 seconds.  24.3 mGy. PROCEDURE: The procedure, risks, benefits, and  alternatives were explained to the patient and his wife. Questions regarding the procedure were encouraged and answered. The patient's wife understands and consents to the procedure. A time-out was performed prior to initiating the procedure. Bilateral flank regions were prepped with chlorhexidine in a sterile fashion, and a sterile drape was applied covering the operative field. A sterile gown and sterile gloves were used for the procedure. Local anesthesia was provided with 1% Lidocaine. Ultrasound was used to localize the left kidney. Under direct ultrasound guidance, a 21 gauge needle was advanced into the renal collecting system. Ultrasound image documentation was performed. Aspiration of urine sample was performed followed by contrast injection. A transitional dilator was advanced over a guidewire. Percutaneous tract dilatation was then performed over the guidewire. A 10-French percutaneous nephrostomy tube was then advanced and formed in the collecting system. Catheter position was confirmed by fluoroscopy after contrast injection. Ultrasound was used to localize the right kidney. Under direct  ultrasound guidance, a 21 gauge needle was advanced into the collecting system. Ultrasound image documentation was performed. After aspiration of urine, contrast was injected. A transitional dilator was advanced. The tract was dilated and a 10 French percutaneous nephrostomy tube advanced over the wire and formed. Catheter positioning was confirmed by fluoroscopy after contrast injection. Both catheters were secured at the skin with Prolene retention sutures and Stat-Lock devices. Both were connected to gravity drainage bags. COMPLICATIONS: None. FINDINGS: Initial ultrasound demonstrates hydronephrosis of both kidneys, left greater than right. The left percutaneous nephrostomy tube was placed via upper pole access with the tube formed at the level of the renal pelvis. There is return of bloody urine after tube placement. The right percutaneous nephrostomy tube was placed via interpolar access and formed at the level of the renal pelvis. There is return of urine containing significant debris. IMPRESSION: Bilateral 10 French percutaneous nephrostomy tube placement utilizing ultrasound and fluoroscopy. Both tubes were formed at the level of the renal pelvis and connected to gravity bag drainage. Electronically Signed   By: Aletta Edouard M.D.   On: 02/23/2020 15:50    Scheduled Meds: . amiodarone  200 mg Oral BID  . Chlorhexidine Gluconate Cloth  6 each Topical Daily  . fentaNYL      . levETIRAcetam  500 mg Oral BID  . magic mouthwash w/lidocaine  15 mL Oral TID  . metoprolol tartrate  50 mg Oral BID  . senna-docusate  2 tablet Oral QHS   Continuous Infusions: .  ceFAZolin (ANCEF) IV       LOS: 10 days   Time spent: 35 minutes.  Lorella Nimrod, MD [facility]  If 7PM-7AM, please contact night-coverage [email or web address]  02/23/2020, 4:49 PM   This record has been created using Systems analyst. Errors have been sought and corrected,but may not always be located. Such  creation errors do not reflect on the standard of care.

## 2020-02-23 NOTE — Procedures (Signed)
Interventional Radiology Procedure Note  Procedure: Bilateral percutaneous nephrostomy tube placement  Complications: None  Estimated Blood Loss: < 10 mL  Findings: Bilateral 10 Fr PCN's placed and formed in renal pelvis. Left sided urine has some dark blood. Right side with some white debris. Both draining well into gravity bags.  Venetia Night. Kathlene Cote, M.D Pager:  [phone]

## 2020-02-23 NOTE — Plan of Care (Signed)
  Problem: Clinical Measurements: Goal: Ability to maintain clinical measurements within normal limits will improve Outcome: Progressing Goal: Respiratory complications will improve Outcome: Progressing Goal: Cardiovascular complication will be avoided Outcome: Progressing   Problem: Nutrition: Goal: Adequate nutrition will be maintained Note: Patient has been NPO most of shift, No intake for dinner time meal as well. No appetite, complained of sore mouth, Magic mouthwash ordered and given.

## 2020-02-23 NOTE — Progress Notes (Signed)
[facility]) Hospital Liaison RN note:  Visited with patient, spouse and daughter in room. Patient is resting in semi-fowlers position, very sleepy. Only complaint, is being thirsty as he knows he cannot drink. Plan is for percutaneous nephrostomy tube placement this afternoon with interventional radiology.  Hospice Home does not have a bed to offer today. Family and hospital care team is aware. [place] Liaison will follow for room availability.  Please call with any hospice related questions or concerns.  Thank you. Margaretmary Eddy, BSN, RN [facility] Liaison [phone]

## 2020-02-23 NOTE — Care Management Important Message (Signed)
Important Message  Patient Details  Name: Timothy Maddox MRN: [id number] Date of Birth: Oct 10, 1945   Medicare Important Message Given:  Yes     Dannette Barbara 02/23/2020, 1:37 PM

## 2020-02-23 NOTE — H&P (Signed)
Chief Complaint: Patient was seen in consultation today for bilateral nephrostomy tube placement at the request of Debroah Loop, PA-C  Referring Physician(s): Debroah Loop, PA-C; Hollice Espy, MD  Patient Status: [facility] - In-pt  History of Present Illness: Timothy Maddox is a 74 y.o. 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 Has been referred for bilateral percutaneous nephrostomy tube placement for urinary diversion and prior to hospice care.  Past Medical History:  Diagnosis Date  . Anxiety   . Arthritis    lower back, shoulders  . AVM (arteriovenous malformation) brain   . Cancer [facility])    prostate- treated with radiation  . CVA (cerebral infarction) 11/2015  . Depression   . Diabetes mellitus without complication ([place])   . Dyspnea    with exertion  . HOH (hard of hearing)   . Hyperlipidemia   . Hypertension   . Pneumonia    1st grade  . Pre-diabetes   . Prediabetes   . Seizures ([place]) 12/2015  . Sleep apnea   . Stroke [facility])    speech impairment, no weakness    Past Surgical History:  Procedure Laterality Date  . BACK SURGERY  1999   disectomy  . BACK SURGERY     2 surgery- injury  . COLONOSCOPY W/ POLYPECTOMY    . COLONOSCOPY WITH PROPOFOL N/A 02/28/2019   Procedure: COLONOSCOPY WITH PROPOFOL;  Surgeon: Toledo, Benay Pike, MD;  Location: [facility] ENDOSCOPY;  Service: Gastroenterology;  Laterality: N/A;  . CYSTOSCOPY WITH URETHRAL DILATATION Bilateral 02/07/2020   Procedure: CYSTOSCOPY WITH URETHRAL DILATATION;  Surgeon: Abbie Sons, MD;  Location: [facility] ORS;  Service: Urology;  Laterality: Bilateral;  . HAMMER TOE SURGERY Bilateral   . INGUINAL HERNIA REPAIR Right    per patient when he was in 1st grade  . IR GENERIC HISTORICAL  12/05/2015   IR RADIOLOGIST EVAL & MGMT 12/05/2015 [facility]  . IR GENERIC HISTORICAL  01/31/2016   IR  RADIOLOGIST EVAL & MGMT 01/31/2016 [facility]  . IR GENERIC HISTORICAL  02/20/2016   IR ANGIO INTRA EXTRACRAN SEL COM CAROTID INNOMINATE UNI L MOD SED 02/20/2016 [name], MD [facility]INTER[facility] RAD  . IR GENERIC HISTORICAL  02/20/2016   IR 3D INDEPENDENT WKST 02/20/2016 [name], MD [facility]  . KNEE SURGERY Bilateral 2015  . RADIOLOGY WITH ANESTHESIA N/A 02/20/2016   Procedure: EMBOLIZATION;  Surgeon: [name], MD;  Location: [facility];  Service: Radiology;  Laterality: N/A;  . TOE SURGERY Left 2017   TOE NAIL REMOVAL  . TOTAL KNEE ARTHROPLASTY Right 07/20/2017   Procedure: TOTAL KNEE ARTHROPLASTY;  Surgeon: Lovell Sheehan, MD;  Location: [facility] ORS;  Service: Orthopedics;  Laterality: Right;    Allergies: Poison oak extract and Poison ivy extract  Medications: Prior to Admission medications   Medication Sig Start Date End Date Taking? Authorizing Provider  amLODipine (NORVASC) 10 MG tablet Take 10 mg by mouth daily.   Yes [provider]  levETIRAcetam (ROWEEPRA) 500 MG tablet Take 500 mg by mouth 2 (two) times daily.   Yes [provider]  losartan-hydrochlorothiazide (HYZAAR) 100-25 MG tablet Take 1 tablet by mouth daily. 05/26/17  Yes [provider]  metFORMIN (GLUCOPHAGE-XR) 500 MG 24 hr tablet Take 500 mg by mouth 2 (two) times daily. 02/06/20  Yes [provider]  metoprolol tartrate (LOPRESSOR) 100 MG tablet Take 100 mg by mouth 2 (two) times daily. 07/27/18  Yes [provider]  NUBEQA 300 MG tablet TAKE 2 TABLETS (600MG ) BY MOUTH TWO TIMES DAILY WITH A MEAL. Patient taking differently: Take 600 mg by mouth in the morning and at bedtime.  11/22/19  Yes Earlie Server, MD  oxyCODONE-acetaminophen (PERCOCET/ROXICET) 5-325 MG tablet Take 1 tablet by mouth every 8 (eight) hours as needed for severe pain. 02/07/20  Yes Stoioff, Ronda Fairly, MD  rosuvastatin (CRESTOR) 5 MG tablet Take 5 mg by mouth at bedtime.  11/10/17  Yes [provider]  spironolactone (ALDACTONE) 100 MG tablet Take 100 mg by mouth 2 (two) times daily.   Yes [provider]  acetaminophen (TYLENOL) 500 MG tablet Take 500 mg by mouth every 6 (six) hours as needed (pain.).     [provider]  tamsulosin (FLOMAX) 0.4 MG CAPS capsule TAKE ONE CAPSULE BY MOUTH ONCE DAILY Patient not taking: Reported on 02/13/2020 02/08/20   Earlie Server, MD     Family History  Problem Relation Age of Onset  . Heart failure Father   . Diabetes Father   . Hypertension Father   . Alzheimer's disease Father 78  . Other Mother 66       homicide  . Alcohol abuse Mother   . Pancreatic cancer Sister   . Alcohol abuse Sister   . Diabetes Brother   . Diabetes Paternal Aunt   . Diabetes Paternal Grandmother   . Diabetes Paternal Grandfather     Social History   Socioeconomic History  . Marital status: Married    Spouse name: Not on file  . Number of children: Not on file  . Years of education: Not on file  . Highest education level: Not on file  Occupational History  . Occupation: retired Curator  Tobacco Use  . Smoking status: Former Smoker    Packs/day: 1.00    Years: 2.00    Pack years: 2.00    Types: Cigarettes    Quit date: 05/05/1966    Years since quitting: 53.8  . Smokeless tobacco: Never Used  . Tobacco comment: quit age 48  Vaping Use  . Vaping Use: Never used  Substance and Sexual Activity  . Alcohol use: Not Currently  . Drug use: No  . Sexual activity: Not Currently  Other Topics Concern  . Not on file  Social History Narrative  . Not on file   Social Determinants of Health   Financial Resource Strain:   . Difficulty of Paying Living Expenses: Not on file  Food Insecurity:   . Worried About Charity fundraiser in the Last Year: Not on file  . Ran Out of Food in the Last Year: Not on file  Transportation Needs:   . Lack of Transportation (Medical): Not on file  . Lack of Transportation (Non-Medical): Not on file    Physical Activity:   . Days of Exercise per Week: Not on file  . Minutes of Exercise per Session: Not on file  Stress:   . Feeling of Stress : Not on file  Social Connections:   . Frequency of Communication with Friends and Family: Not on file  . Frequency of Social Gatherings with Friends and Family: Not on file  . Attends Religious Services: Not on file  . Active Member of Clubs or Organizations: Not on file  . Attends Archivist Meetings: Not on file  . Marital Status: Not on file    ECOG Status: 4 - Bedbound  Review of Systems: A  12 point ROS discussed and pertinent positives are indicated in the HPI above.  All other systems are negative.  Review of Systems  Constitutional: Positive for activity change, appetite change and fatigue.  Respiratory: Positive for shortness of breath.   Cardiovascular: Negative.   Gastrointestinal: Positive for abdominal distention and abdominal pain.  Genitourinary: Positive for difficulty urinating and hematuria.  Musculoskeletal: Negative.   Neurological: Negative.     Vital Signs: BP 140/63   Pulse 78   Temp 98.5 F (36.9 C)   Resp 16   Ht 6\' 1"  (1.854 m)   Wt 116.2 kg   SpO2 92%   BMI 33.80 kg/m   Physical Exam Vitals reviewed.  Constitutional:      Appearance: He is obese. He is ill-appearing.  Cardiovascular:     Rate and Rhythm: Normal rate and regular rhythm.     Heart sounds: Normal heart sounds. No murmur heard.  No gallop.   Pulmonary:     Effort: Respiratory distress present.     Breath sounds: No stridor. No wheezing or rales.     Comments: Decreased breath sounds. Abdominal:     General: Bowel sounds are normal.     Palpations: Abdomen is soft.  Musculoskeletal:        General: No swelling.  Skin:    General: Skin is warm and dry.  Neurological:     Mental Status: He is disoriented.     Imaging: DG Abd 1 View  Result Date: 02/18/2020 CLINICAL DATA:  Dyspnea and abdominal pain. Per  physician notes - States he feels dyspnea with exertion as he recently used the restroom with Physical Therapy. Denies chest pain. Patient also denies any symptoms when he went into A fib with RVR last evening. Hx - prostate cancer, DM, HTN, PNA, former smoker. EXAM: ABDOMEN - 1 VIEW COMPARISON:  CT, 02/13/2020 FINDINGS: Increased bowel gas, but no bowel dilation to suggest obstruction. A catheter projects in the lower pelvis consistent with a urinary catheter. There are 3 radiation therapy markers projecting in the region of the prostate gland. A calcification is noted in the right upper quadrant consistent with a gallstone as was noted on the recent CT. There are scattered aortic and iliac atherosclerotic vascular calcifications. Soft tissues are poorly defined but otherwise unremarkable. No acute skeletal abnormality. Lung base nodules are incompletely imaged, better noted on the recent prior CT. IMPRESSION: 1. No acute findings.  No evidence of bowel obstruction. Electronically Signed   By: Lajean Manes M.D.   On: 02/18/2020 17:17   CT ANGIO CHEST PE W OR WO CONTRAST  Result Date: 02/13/2020 CLINICAL DATA:  Atrial fibrillation, elevated D-dimer, prostate cancer EXAM: CT ANGIOGRAPHY CHEST WITH CONTRAST TECHNIQUE: Multidetector CT imaging of the chest was performed using the standard protocol during bolus administration of intravenous contrast. Multiplanar CT image reconstructions and MIPs were obtained to evaluate the vascular anatomy. CONTRAST:  81mL OMNIPAQUE IOHEXOL 350 MG/ML SOLN COMPARISON:  None. FINDINGS: Cardiovascular: There is adequate opacification of the pulmonary arterial tree. There are multiple branching central intraluminal filling defects identified within the right upper and right lower lobar pulmonary arteries as well as multiple segmental pulmonary arteries of the left lower lobe in keeping with acute pulmonary embolism. The central pulmonary arteries are enlarged in keeping with changes  of pulmonary arterial hypertension. However, there is no CT evidence of right heart strain with the right to left ventricular ratio within normal limits. Moderate coronary artery calcification. Cardiac size is mildly  enlarged. Moderate calcification of the mitral valve annulus. No pericardial effusion. The thoracic aorta is of normal caliber. Mild atherosclerotic calcification is seen within the aortic arch and descending thoracic aorta. Mediastinum/Nodes: There is pathologic bilateral hilar adenopathy. Index lymph node within the right hilum measures 16 mm x 25 mm at axial image # 42/4. Thyroid unremarkable. Esophagus unremarkable. Lungs/Pleura: Multiple bilateral pulmonary masses are identified most in keeping with pulmonary metastatic disease in this patient with a known history of prostate cancer. Index lesion within the right lower lobe measures 3.5 x 5.2 cm at axial image # 62/6. At least 50 nodules are seen scattered throughout the lungs bilaterally. No pneumothorax or pleural effusion. The central airways are widely patent. Upper Abdomen: Unremarkable Musculoskeletal: Multiple sclerotic metastases are identified within the visualized axial skeleton involving the scapula bilaterally, the sternum, the thoracic spine, and multiple ribs bilaterally. No pathologic fracture. Review of the MIP images confirms the above findings. IMPRESSION: Acute pulmonary embolism. Moderate embolic burden. No CT evidence of right heart strain. Moderate coronary artery calcification.  Mild global cardiomegaly. Innumerable bilateral pulmonary masses and nodules as well as bilateral pathologic hilar adenopathy and widespread sclerotic osseous metastases in keeping with visceral and osseous metastatic disease related to the patient's underlying prostate cancer. Aortic Atherosclerosis (ICD10-I70.0). Electronically Signed   By: Fidela Salisbury MD   On: 02/13/2020 23:27   CT PELVIS WO CONTRAST  Result Date: 02/20/2020 CLINICAL DATA:   Urinary retention, prostate cancer, urethral stricture EXAM: CT PELVIS WITHOUT CONTRAST TECHNIQUE: Multidetector CT imaging of the pelvis was performed following the standard protocol without intravenous contrast. COMPARISON:  02/13/20. FINDINGS: Urinary Tract: Foley catheter balloon is again seen within the bladder lumen. Despite this, the bladder appears mildly distended, raising the question of obstruction of the catheter. There are numerous layering calcifications within the bladder lumen, slightly improved from prior examination but persistent. Additionally, a small amount of calcific debris is seen within the Foley catheter lumen suggesting partial obstruction secondary to debris. Gas is seen within the bladder lumen related to probable catheterization. There is mild perivesicular inflammatory stranding, similar to prior examination, likely infectious or inflammatory in nature. There is again noted mild right and moderate left hydroureter which appears similar to that seen on prior examination suggesting obstruction of the ureters bilaterally at the ureterovesicular junctions. Locally invasive prostatic mass with invasion of the bladder trigone is again noted. Brachytherapy seeds are seen within the prostate gland. Bowel: Small broad-based umbilical hernia is again identified containing a single wall of the otherwise unremarkable mid transverse colon. The visualized large and small bowel are otherwise unremarkable. Appendix normal. No free fluid within the pelvis. Vascular/Lymphatic: Extensive aortoiliac atherosclerotic calcification. No aortic aneurysm. No pathologic adenopathy within the pelvis. Reproductive: Microlobulated prostatic mass invades the base of the bladder as described above. This is similar in appearance to prior examination. Other: Small fat containing left inguinal hernia noted. Rectum unremarkable. Musculoskeletal: Sclerotic metastases are again identified within the visualized axial  skeleton in keeping with osseous metastatic disease. Similarly, 4.7 cm soft tissue mass is seen within the subcutaneous fat of the left gluteal region in keeping with a soft tissue metastasis. IMPRESSION: Stable examination. Prostatic mass invades the base of the bladder with resultant obstruction of the ureterovesicular junctions bilaterally and mild-to-moderate hydroureter noted bilaterally. Foley catheter balloon remains within the bladder lumen, however, mild bladder distension persists and calcific debris within the catheter lumen suggests partial obstruction due to debris. Layering bladder calculi again noted, slightly improved from  prior examination possibly related to fragmentation. Perivesicular inflammatory stranding suggesting persistent infectious or inflammatory cystitis. Sclerotic osseous and subcutaneous soft tissue metastatic disease again identified. Aortic Atherosclerosis (ICD10-I70.0). Electronically Signed   By: Fidela Salisbury MD   On: 02/20/2020 16:05   US RENAL  Result Date: 02/19/2020 CLINICAL DATA:  Acute renal injury EXAM: RENAL / URINARY TRACT ULTRASOUND COMPLETE COMPARISON:  CT from 02/13/2020 FINDINGS: Right Kidney: Renal measurements: 13.0 x 6.8 x 5.7 cm. = volume: 266 mL. Mild to moderate hydronephrosis is noted. Left Kidney: Renal measurements: 14.8 x 7.1 x 6.1 cm. = volume: 333 mL. Moderate to severe hydronephrosis is noted on the left. Additionally there is a 3.9 cm cystic lesion in the lower pole which appears simple in nature. Bladder: Decompressed by Foley catheter. Multiple bladder calculi are again identified and stable. Other: None. IMPRESSION: Stable appearing hydronephrosis bilaterally worse on the left than the right this is stable from prior CT examination. Left renal cyst. Multiple bladder calculi. Electronically Signed   By: Inez Catalina M.D.   On: 02/19/2020 20:12   US BIOPSY (LIVER)  Result Date: 02/17/2020 INDICATION: 74 year old with history of prostate  cancer and evidence for metastatic disease in the chest and liver. Tissue diagnosis is needed. EXAM: ULTRASOUND-GUIDED LIVER LESION BIOPSY MEDICATIONS: None. ANESTHESIA/SEDATION: Moderate (conscious) sedation was employed during this procedure. A total of Versed 1.0 mg and Fentanyl 100 mcg was administered intravenously. Moderate Sedation Time: 16 minutes. The patient's level of consciousness and vital signs were monitored continuously by radiology nursing throughout the procedure under my direct supervision. FLUOROSCOPY TIME:  None COMPLICATIONS: None immediate. PROCEDURE: Informed written consent was obtained from the patient after a thorough discussion of the procedural risks, benefits and alternatives. All questions were addressed. A timeout was performed prior to the initiation of the procedure. The liver was evaluated with ultrasound. Hypoechoic lesion along the inferior right hepatic lobe was identified and targeted for biopsy. The right side of the abdomen was prepped with chlorhexidine and sterile field was created. Skin and soft tissues were anesthetized using 1% lidocaine. Small incision was made. Using ultrasound guidance, 17 gauge coaxial needle was directed into the right hepatic lobe and into the hypoechoic lesion. Total of 3 core biopsies were obtained with 18 gauge core device. Gel-Foam slurry was injected through the 17 gauge coaxial needle as it was removed. Bandage placed over the puncture site. FINDINGS: Scattered hypoechoic lesions in the liver. Lesion in the inferior right hepatic lobe was sampled. Biopsy needle was confirmed within the lesion. Adequate specimens were obtained and placed in formalin. No immediate bleeding or hematoma formation. IMPRESSION: Ultrasound-guided core biopsy of right hepatic lesion. Electronically Signed   By: Markus Daft M.D.   On: 02/17/2020 16:16   US Venous Img Lower Bilateral (DVT)  Result Date: 02/14/2020 CLINICAL DATA:  Pulmonary embolus EXAM: LEFT LOWER  EXTREMITY VENOUS DOPPLER ULTRASOUND TECHNIQUE: Gray-scale sonography with graded compression, as well as color Doppler and duplex ultrasound were performed to evaluate the lower extremity deep venous systems from the level of the common femoral vein and including the common femoral, femoral, profunda femoral, popliteal and calf veins including the posterior tibial, peroneal and gastrocnemius veins when visible. The superficial great saphenous vein was also interrogated. Spectral Doppler was utilized to evaluate flow at rest and with distal augmentation maneuvers in the common femoral, femoral and popliteal veins. COMPARISON:  None. FINDINGS: Contralateral Common Femoral Vein: Respiratory phasicity is normal and symmetric with the symptomatic side. No evidence of thrombus.  Normal compressibility. Common Femoral Vein: No evidence of thrombus. Normal compressibility, respiratory phasicity and response to augmentation. Saphenofemoral Junction: No evidence of thrombus. Normal compressibility and flow on color Doppler imaging. Profunda Femoral Vein: No evidence of thrombus. Normal compressibility and flow on color Doppler imaging. Femoral Vein: No evidence of thrombus. Normal compressibility, respiratory phasicity and response to augmentation. Popliteal Vein: No evidence of thrombus. Normal compressibility, respiratory phasicity and response to augmentation. Calf Veins: There is nonocclusive thrombus within the left peroneal veins. Superficial Great Saphenous Vein: No evidence of thrombus. Normal compressibility. Venous Reflux:  None. Other Findings:  None. IMPRESSION: Nonocclusive thrombus within the left peroneal veins. Electronically Signed   By: Ulyses Jarred M.D.   On: 02/14/2020 01:36   DG Chest Port 1 View  Result Date: 02/20/2020 CLINICAL DATA:  Dyspnea EXAM: PORTABLE CHEST 1 VIEW COMPARISON:  02/18/2020, CT 02/13/2020 FINDINGS: Multiple bilateral lung nodules. No acute consolidation or effusion. Enlarged  cardiomediastinal silhouette. Bilateral hilar enlargement likely due to nodes. No pneumothorax. IMPRESSION: 1. No acute airspace disease. 2. Bilateral pulmonary nodules/masses and hilar enlargement, consistent with metastatic disease. Electronically Signed   By: Donavan Foil M.D.   On: 02/20/2020 18:12   DG Chest Port 1 View  Result Date: 02/18/2020 CLINICAL DATA:  Dyspnea, abdominal pain EXAM: PORTABLE CHEST 1 VIEW COMPARISON:  Chest radiographs, 11/13/2015, CT chest 02/13/2020 FINDINGS: The heart size and mediastinal contours are within normal limits. Low volume AP portable examination without acute abnormality. Multiple bilateral pulmonary masses and nodules. The visualized skeletal structures are unremarkable. IMPRESSION: 1. No acute abnormality of the lungs in low volume AP portable examination. 2. Multiple bilateral pulmonary masses and nodules as seen on prior CT and in keeping with metastatic disease. Electronically Signed   By: Eddie Candle M.D.   On: 02/18/2020 17:15   DG OR UROLOGY CYSTO IMAGE ([place])  Result Date: 02/07/2020 There is no interpretation for this exam.  This order is for images obtained during a surgical procedure.  Please See "Surgeries" Tab for more information regarding the procedure.   ECHOCARDIOGRAM COMPLETE  Result Date: 02/14/2020    ECHOCARDIOGRAM REPORT   Patient Name:   LEWAYNE PAULEY Date of Exam: 02/14/2020 Medical Rec #:  [id number]       Height:       73.0 in Accession #:    [id number]      Weight:       270.0 lb Date of Birth:  [birth year]        BSA:          2.444 m Patient Age:    5 years        BP:           132/71 mmHg Patient Gender: M               HR:           70 bpm. Exam Location:  [facility] Procedure: 2D Echo, Color Doppler, Cardiac Doppler and Intracardiac            Opacification Agent Indications:     I48.91 Atrial Fibrillation  History:         Patient has prior history of Echocardiogram examinations.                  Stroke; Risk Factors:Sleep  Apnea, Hypertension, Dyslipidemia                  and Diabetes.  Sonographer:  Charmayne Sheer RDCS (AE) Referring Phys:  [id number] [place] Diagnosing Phys: Neoma Laming MD  Sonographer Comments: Technically difficult study due to poor echo windows. Image acquisition challenging due to patient body habitus. IMPRESSIONS  1. Left ventricular ejection fraction, by estimation, is 60 to 65%. The left ventricle has normal function. The left ventricle has no regional wall motion abnormalities. There is mild left ventricular hypertrophy. Left ventricular diastolic parameters are consistent with Grade I diastolic dysfunction (impaired relaxation).  2. Right ventricular systolic function is normal. The right ventricular size is normal.  3. Left atrial size was mildly dilated.  4. Right atrial size was mildly dilated.  5. The mitral valve is normal in structure. No evidence of mitral valve regurgitation. No evidence of mitral stenosis.  6. The aortic valve is normal in structure. Aortic valve regurgitation is not visualized. Mild aortic valve sclerosis is present, with no evidence of aortic valve stenosis.  7. The inferior vena cava is normal in size with greater than 50% respiratory variability, suggesting right atrial pressure of 3 mmHg. FINDINGS  Left Ventricle: Left ventricular ejection fraction, by estimation, is 60 to 65%. The left ventricle has normal function. The left ventricle has no regional wall motion abnormalities. Definity contrast agent was given IV to delineate the left ventricular  endocardial borders. The left ventricular internal cavity size was normal in size. There is mild left ventricular hypertrophy. Left ventricular diastolic parameters are consistent with Grade I diastolic dysfunction (impaired relaxation). Right Ventricle: The right ventricular size is normal. No increase in right ventricular wall thickness. Right ventricular systolic function is normal. Left Atrium: Left atrial size was mildly  dilated. Right Atrium: Right atrial size was mildly dilated. Pericardium: There is no evidence of pericardial effusion. Mitral Valve: The mitral valve is normal in structure. No evidence of mitral valve regurgitation. No evidence of mitral valve stenosis. MV peak gradient, 6.0 mmHg. The mean mitral valve gradient is 2.0 mmHg. Tricuspid Valve: The tricuspid valve is normal in structure. Tricuspid valve regurgitation is not demonstrated. No evidence of tricuspid stenosis. Aortic Valve: The aortic valve is normal in structure. Aortic valve regurgitation is not visualized. Mild aortic valve sclerosis is present, with no evidence of aortic valve stenosis. Aortic valve mean gradient measures 3.0 mmHg. Aortic valve peak gradient measures 7.3 mmHg. Pulmonic Valve: The pulmonic valve was normal in structure. Pulmonic valve regurgitation is not visualized. No evidence of pulmonic stenosis. Aorta: The aortic root is normal in size and structure. Venous: The inferior vena cava is normal in size with greater than 50% respiratory variability, suggesting right atrial pressure of 3 mmHg. IAS/Shunts: No atrial level shunt detected by color flow Doppler.   LV Volumes (MOD) LV vol d, MOD A4C: 91.4 ml Diastology LV vol s, MOD A4C: 26.8 ml LV e' medial:    8.81 cm/s LV SV MOD A4C:     91.4 ml LV E/e' medial:  7.1                            LV e' lateral:   12.40 cm/s                            LV E/e' lateral: 5.0  AORTIC VALVE AV Vmax:           135.00 cm/s AV Vmean:          84.100 cm/s AV VTI:  0.227 m AV Peak Grad:      7.3 mmHg AV Mean Grad:      3.0 mmHg LVOT Vmax:         84.20 cm/s LVOT Vmean:        56.100 cm/s LVOT VTI:          0.108 m LVOT/AV VTI ratio: 0.48 MITRAL VALVE MV Area (PHT): 4.49 cm    SHUNTS MV Peak grad:  6.0 mmHg    Systemic VTI: 0.11 m MV Mean grad:  2.0 mmHg MV Vmax:       1.22 m/s MV Vmean:      64.5 cm/s MV Decel Time: 169 msec MV E velocity: 62.60 cm/s MV A velocity: 85.30 cm/s MV E/A ratio:   0.73 Neoma Laming MD Electronically signed by Neoma Laming MD Signature Date/Time: 02/14/2020/4:30:36 PM    Final    CT Renal Stone Study  Result Date: 02/13/2020 CLINICAL DATA:  Prostate cancer. Pink colored urine. Hypotension. Flank pain. EXAM: CT ABDOMEN AND PELVIS WITHOUT CONTRAST TECHNIQUE: Multidetector CT imaging of the abdomen and pelvis was performed following the standard protocol without IV contrast. COMPARISON:  12/23/2017 CT abdomen/pelvis. FINDINGS: Lower chest: Numerous (greater than 20) solid pulmonary nodules and masses at both lung bases, largest 5.2 cm in the basilar right lower lobe (series 4/image 27) and 5.5 cm in the basilar left lower lobe (series 4/image 27), all new. Atherosclerosis. Hepatobiliary: Multiple (at least 5) hypodense liver masses scattered throughout the liver, largest 3.8 cm in the segment 6 right liver lobe (series 2/image 36) and 2.0 cm in the superior right liver (series 2/image 22), all new. Cholelithiasis. No gallbladder wall thickening or pericholecystic fluid. No biliary ductal dilatation. Pancreas: Normal, with no mass or duct dilation. Spleen: Normal size. No mass. Adrenals/Urinary Tract: Normal right adrenal. Left adrenal 1.5 cm nodule with density 17 HU, stable, most compatible with an adenoma. Moderate bilateral hydroureteronephrosis to the level of the ureterovesical junctions bilaterally. No renal stones. No ureteral stones. Simple 3.7 cm posterior lower left renal cyst. No additional contour deforming renal lesions. Multiple layering stones in distended bladder, largest 19 mm. Chronic mild diffuse bladder wall thickening is unchanged. Foley catheter in place with balloon in the dependent bladder. Stomach/Bowel: Normal non-distended stomach. Normal caliber small bowel with no small bowel wall thickening. Normal appendix. Normal large bowel with no diverticulosis, large bowel wall thickening or pericolonic fat stranding. Vascular/Lymphatic: Atherosclerotic  nonaneurysmal abdominal aorta. No pathologically enlarged lymph nodes in the abdomen or pelvis. Reproductive: Newly mildly enlarged, irregular and poorly marginated prostate, directly contiguous with the bladder trigone. Fiducial markers noted in the prostate as before. Other: No pneumoperitoneum, ascites or focal fluid collection. Solid subcutaneous 2.9 cm lateral right chest wall mass (series 2/image 29), new. Similar solid subcutaneous 4.4 cm left gluteal mass (series 2/image 63). Small fat containing umbilical hernia is stable. Musculoskeletal: Numerous new sclerotic osseous lesions throughout the visualized skeleton including multiple lower ribs bilaterally, multiple thoracolumbar vertebrae largest at T11 and throughout the bilateral pelvic girdle, most prominent in the left iliac crest (series 2/image 60). Marked lumbar spondylosis. IMPRESSION: 1. Numerous new solid pulmonary nodules and masses at both lung bases, largest 5.2 cm in the basilar right lower lobe, compatible with pulmonary metastases. 2. New widespread sclerotic osseous metastases throughout the visualized skeleton as detailed. 3. Multiple new hypodense liver masses compatible with liver metastases. 4. Newly mildly enlarged, irregular and poorly marginated prostate, directly contiguous with the bladder trigone, suggestive of recurrent locally  advanced prostate cancer. 5. Moderate bilateral hydroureteronephrosis to the level of the UVJ bilaterally, probably due to bladder outlet obstruction despite the well-positioned Foley catheter in the bladder. 6. Multiple layering bladder stones in the distended bladder. 7. Cholelithiasis. 8. Stable left adrenal adenoma. 9. Aortic Atherosclerosis (ICD10-I70.0). Electronically Signed   By: Ilona Sorrel M.D.   On: 02/13/2020 10:06   US Abdomen Limited RUQ  Result Date: 02/17/2020 CLINICAL DATA:  74 year old with history of prostate cancer. Suspicious liver lesions on noncontrast CT. EXAM: ULTRASOUND  ABDOMEN LIMITED RIGHT UPPER QUADRANT COMPARISON:  CT renal stone protocol 02/13/2020 FINDINGS: Gallbladder: Echogenic stone at the base of the gallbladder measuring up to 1.0 cm. Common bile duct: Diameter: 0.5 cm Liver: Liver parenchyma is mildly heterogeneous. There are scattered hypoechoic liver lesions that correspond with the previous CT findings. Round hypoechoic lesion in the right hepatic lobe measures up to 2.5 cm. Round hypoechoic lesion along the inferior margin of the right hepatic lobe measures up to 2.3 cm. Large hypoechoic lesion along the posterior aspect of the liver measures up to 4.2 cm. Portal vein is patent on color Doppler imaging with normal direction of blood flow towards the liver. Other: Partially visualized right kidney demonstrates dilatation of the right renal collecting system and similar to the recent CT. IMPRESSION: 1. Hypoechoic liver lesions. Findings are suggestive for metastatic disease. Patient is scheduled for ultrasound-guided core biopsy of a hepatic lesion. 2. Cholelithiasis. No evidence for gallbladder inflammation or biliary dilatation. Electronically Signed   By: Markus Daft M.D.   On: 02/17/2020 09:33    Labs:  CBC: Recent Labs    02/18/20 0435 02/19/20 0319 02/20/20 0257 02/21/20 0422  WBC 11.4* 15.0* 18.2* 15.4*  HGB 9.4* 10.1* 10.6* 9.4*  HCT 28.6* 30.4* 32.1* 29.0*  PLT 230 275 304 329    COAGS: Recent Labs    02/13/20 1022 02/17/20 0350 02/20/20 2128 02/21/20 0422  INR 1.2 1.2  --   --   APTT 38*  --  58* >160*    BMP: Recent Labs    05/17/19 1249 05/17/19 1249 08/09/19 1245 08/09/19 1245 11/01/19 0925 11/01/19 0925 01/30/20 0937 02/12/20 1758 02/18/20 0435 02/19/20 0319 02/20/20 0257 02/21/20 0422  NA 138   < > 137   < > 136   < > 136   < > 133* 135 135 136  K 4.1   < > 4.0   < > 4.1   < > 4.9   < > 4.0 5.0 5.1 5.0  CL 103   < > 102   < > 103   < > 104   < > 103 104 102 105  CO2 24   < > 25   < > 24   < > 25   < > 19*  16* 17* 18*  GLUCOSE 118*   < > 114*   < > 133*   < > 133*   < > 158* 138* 136* 135*  BUN 25*   < > 19   < > 25*   < > 39*   < > 39* 60* 78* 88*  CALCIUM 9.4   < > 9.3   < > 9.2   < > 9.2   < > 9.0 9.1 9.4 9.1  CREATININE 1.23   < > 1.24   < > 1.45*   < > 1.62*   < > 1.72* 3.45* 4.06* 2.98*  GFRNONAA 58*   < > 57*   < >  47*   < > 41*   < > 39* 17* 14* 20*  GFRAA >60  --  >60  --  55*  --  48*  --   --   --   --   --    < > = values in this interval not displayed.    LIVER FUNCTION TESTS: Recent Labs    05/17/19 1249 08/09/19 1245 11/01/19 0925 01/30/20 0937  BILITOT 0.6 0.9 0.7 0.6  AST 25 29 26 18   ALT 25 26 23 14   ALKPHOS 44 41 45 53  PROT 7.5 7.2 7.5 7.7  ALBUMIN 4.1 3.9 4.1 3.8     Assessment and Plan:  Discussed nephrostomy tube placement with Timothy Maddox. Risks and benefits of bilateral PCN placement was discussed with the patient including, but not limited to, infection, bleeding, significant bleeding causing loss or decrease in renal function or damage to adjacent structures. All of the patient's questions were answered, patient and his Maddox are agreeable to proceed. Consent signed by Maddox and in chart.  Thank you for this interesting consult.  I greatly enjoyed meeting Timothy Maddox and look forward to participating in their care.  A copy of this report was sent to the requesting provider on this date.  Electronically Signed: Azzie Roup, MD 02/23/2020, 1:58 PM     I spent a total of 20 Minutes in face to face in clinical consultation, greater than 50% of which was counseling/coordinating care for nephrostomy tube placement.

## 2020-02-23 NOTE — Progress Notes (Signed)
Urology Inpatient Progress Note  Subjective: No acute events overnight. Foley catheter in place draining red urine, sediment noted in the drainage tubing. Patient asleep and unable to contribute to HPI today.  He has been n.p.o. since midnight.  Anti-infectives: Anti-infectives (From admission, onward)   Start     Dose/Rate Route Frequency Ordered Stop   02/19/20 2200  cephALEXin (KEFLEX) capsule 500 mg  Status:  Discontinued        500 mg Oral Every 8 hours 02/19/20 1607 02/20/20 1614   02/18/20 0600  cephALEXin (KEFLEX) capsule 500 mg  Status:  Discontinued        500 mg Oral Every 6 hours 02/17/20 1252 02/19/20 1607   02/13/20 1000  ceFEPIme (MAXIPIME) 2 g in sodium chloride 0.9 % 100 mL IVPB  Status:  Discontinued        2 g 200 mL/hr over 30 Minutes Intravenous  Once 02/13/20 0942 02/13/20 0953   02/13/20 1000  cefTRIAXone (ROCEPHIN) 2 g in sodium chloride 0.9 % 100 mL IVPB  Status:  Discontinued        2 g 200 mL/hr over 30 Minutes Intravenous Every 24 hours 02/13/20 0954 02/17/20 1252      Current Facility-Administered Medications  Medication Dose Route Frequency Provider Last Rate Last Admin  . acetaminophen (TYLENOL) tablet 1,000 mg  1,000 mg Oral Q6H PRN Enzo Bi, MD   1,000 mg at 02/22/20 0626  . amiodarone (PACERONE) tablet 200 mg  200 mg Oral BID Adaline Sill, NP   200 mg at 02/22/20 2116  . Chlorhexidine Gluconate Cloth 2 % PADS 6 each  6 each Topical Daily Ivor Costa, MD   6 each at 02/22/20 9485  . hydrALAZINE (APRESOLINE) injection 5 mg  5 mg Intravenous Q2H PRN Ivor Costa, MD      . levETIRAcetam (KEPPRA) tablet 500 mg  500 mg Oral BID Ivor Costa, MD   500 mg at 02/22/20 2115  . LORazepam (ATIVAN) injection 0.5 mg  0.5 mg Intravenous Q12H PRN Ivor Costa, MD   0.5 mg at 02/22/20 0057  . LORazepam (ATIVAN) injection 0.5-1 mg  0.5-1 mg Intravenous Q2H PRN Ivor Costa, MD      . metoprolol tartrate (LOPRESSOR) tablet 50 mg  50 mg Oral BID Ivor Costa, MD   50 mg at  02/22/20 2115  . morphine 2 MG/ML injection 1 mg  1 mg Intravenous Q2H PRN Enzo Bi, MD   1 mg at 02/22/20 1543  . morphine CONCENTRATE 10 MG/0.5ML oral solution 5 mg  5 mg Oral Q2H PRN Enzo Bi, MD   5 mg at 02/22/20 1308   Or  . morphine CONCENTRATE 10 MG/0.5ML oral solution 5 mg  5 mg Sublingual Q2H PRN Enzo Bi, MD      . ondansetron [facility]) injection 4 mg  4 mg Intravenous Q6H PRN Enzo Bi, MD   4 mg at 02/22/20 1543  . ondansetron (ZOFRAN-ODT) disintegrating tablet 4 mg  4 mg Oral Q8H PRN Enzo Bi, MD      . senna-docusate (Senokot-S) tablet 2 tablet  2 tablet Oral QHS Enzo Bi, MD   2 tablet at 02/18/20 2129  . simethicone (MYLICON) chewable tablet 80 mg  80 mg Oral QID PRN Enzo Bi, MD   80 mg at 02/18/20 1329  . sodium chloride flush (NS) 0.9 % injection 3 mL  3 mL Intravenous PRN Lorella Nimrod, MD   3 mL at 02/14/20 2314   Objective: Vital signs in  last 24 hours: Temp:  [97.8 F (36.6 C)-98.5 F (36.9 C)] 98.5 F (36.9 C) (10/21 0700) Pulse Rate:  [69-75] 75 (10/21 0700) Resp:  [21-29] 29 (10/21 0700) BP: (114-122)/(59-64) 122/60 (10/21 0700) SpO2:  [93 %-97 %] 95 % (10/21 0400)  Intake/Output from previous day: 10/20 0701 - 10/21 0700 In: 120  Out: 1000 [Urine:1000] Intake/Output this shift: No intake/output data recorded.  Physical Exam Vitals and nursing note reviewed.  Constitutional:      General: He is sleeping. He is not in acute distress.    Appearance: He is not ill-appearing, toxic-appearing or diaphoretic.  Pulmonary:     Effort: Pulmonary effort is normal. No respiratory distress.  Skin:    General: Skin is warm and dry.    Lab Results:  Recent Labs    02/21/20 0422  WBC 15.4*  HGB 9.4*  HCT 29.0*  PLT 329   BMET Recent Labs    02/21/20 0422  NA 136  K 5.0  CL 105  CO2 18*  GLUCOSE 135*  BUN 88*  CREATININE 2.98*  CALCIUM 9.1   Assessment & Plan: 74 year old male with metastatic prostate cancer, frozen pelvis, and ongoing  clot retention with AKI.  Foley noted to be draining somewhat this morning.  I spoke with IR this morning, who confirms plans for bilateral PCN placement this afternoon.  Patient to be kept n.p.o. in advance of the procedure.  Debroah Loop, PA-C 02/23/2020

## 2020-02-23 NOTE — Progress Notes (Signed)
Patient clinically stable post bilateral 10 FR Nephrostomy tube placement per Dr Kathlene Cote, tolerated well. Vitals stable pre and post procedure. Denies complaints at this time. Dr Kathlene Cote out to speak with wife post procedure with update given. Received Fentanyl 25 mcg IV for procedure.report given to [facility] post procedure.

## 2020-02-23 NOTE — TOC Progression Note (Signed)
Transition of Care [facility]) - Progression Note    Patient Details  Name: Timothy Maddox MRN: [id number] Date of Birth: 1946/01/10  [facility] CM/SW Contact  Beverly Sessions, RN Phone Number: 02/23/2020, 1:42 PM  Clinical Narrative:      Patient still pending discharge to [place].  No bed availability today       Expected Discharge Plan and Services                                                 Social Determinants of Health (SDOH) Interventions    Readmission Risk Interventions No flowsheet data found.

## 2020-02-24 DIAGNOSIS — C61 Malignant neoplasm of prostate: Secondary | ICD-10-CM | POA: Diagnosis not present

## 2020-02-24 DIAGNOSIS — N390 Urinary tract infection, site not specified: Secondary | ICD-10-CM | POA: Diagnosis not present

## 2020-02-24 DIAGNOSIS — N179 Acute kidney failure, unspecified: Secondary | ICD-10-CM | POA: Diagnosis not present

## 2020-02-24 DIAGNOSIS — I2699 Other pulmonary embolism without acute cor pulmonale: Secondary | ICD-10-CM | POA: Diagnosis not present

## 2020-02-24 MED ORDER — AMIODARONE HCL 200 MG PO TABS: 200.0000 mg | ORAL_TABLET | Freq: Two times a day (BID) | ORAL | Status: AC

## 2020-02-24 MED ORDER — MAGIC MOUTHWASH W/LIDOCAINE: 15.0000 mL | Freq: Three times a day (TID) | ORAL | 0 refills | Status: AC

## 2020-02-24 MED ORDER — METOPROLOL TARTRATE 50 MG PO TABS: 50.0000 mg | ORAL_TABLET | Freq: Two times a day (BID) | ORAL | Status: AC

## 2020-02-24 MED ORDER — SIMETHICONE 80 MG PO CHEW: 80.0000 mg | CHEWABLE_TABLET | Freq: Four times a day (QID) | ORAL | 0 refills | Status: AC | PRN

## 2020-02-24 NOTE — Discharge Summary (Signed)
Physician Discharge Summary  Timothy Maddox AST:[id number] DOB: 1945-12-24 DOA: 02/13/2020  PCP: Timothy Marble, MD  Admit date: 02/13/2020 Discharge date: 02/24/2020  Admitted From: Home Disposition: Hospice facility  Recommendations for Outpatient Follow-up:  1. Follow up with PCP in 1-2 weeks 2. Please obtain BMP/CBC in one week 3. 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  Pt has a h/o prostate CA, urethral stricture. He iss/pofrecent cystoscopy and foley placementbyDr. Stoioff on 02/07/20. On arrival he was hypotensive which improved with IV fluids, UA looks infected, COVID-19 negative, worsening renal function. CT renal stone study was concerning for multiple mets involving lung, osseous mets and liver. Moderate bilateral hydronephrosis to the level of UVJ bilaterally and multiple layering bladder stones surrounding the Foley catheter tip.  CTA was obtained due to elevated D-dimer and came back positive for PE, without any CT evidence of right heart strain, patient was started on IV heparin. Developed new onset A. fib with RVR-started on Cardizem infusion, overnight converted back to sinus rhythm and Cardizem infusion was discontinued.  Oncology was also consulted and due to rapid disease progression patient was transitioned to hospice/comfort measures.  Percutaneous drain to be placed by IR for recurrent urinary retention for palliative reasons.  Draining well.  Patient is being discharged to hospice facility for continuation of comfort  measures.  Discharge Diagnoses:  Principal Problem:   Urinary tract infection associated with indwelling urethral catheter (HCC) Active Problems:   Stroke ([place])   Seizures (HCC)   Prostate cancer (HCC)   Hematuria   Hypotension   Hypertension   Type II diabetes mellitus with renal manifestations (HCC)   AKI (acute kidney injury) ([place])   Chronic diastolic CHF (congestive heart failure) (HCC)   Atrial fibrillation with RVR (HCC)   PE (pulmonary thromboembolism) (HCC)   Liver mass   Palliative care encounter   Iron deficiency anemia due to chronic blood loss   Acute hypoxemic respiratory failure (HCC)   Obstructive uropathy   Clot retention of urine   Abdominal pain   Hydronephrosis  Discharge Instructions  Discharge Instructions    Diet - low sodium heart healthy   Complete by: As directed    Increase activity slowly   Complete by: As directed      Allergies as of 02/24/2020      Reactions   Poison Oak Extract Rash   Poison Ivy Extract Rash      Medication List    STOP taking these medications   amLODipine 10 MG tablet Commonly known as: NORVASC   losartan-hydrochlorothiazide 100-25 MG tablet Commonly known as: HYZAAR   metFORMIN 500 MG 24 hr tablet Commonly known as: GLUCOPHAGE-XR   Nubeqa 300 MG tablet Generic drug: darolutamide   rosuvastatin 5 MG tablet Commonly known as: CRESTOR   spironolactone 100 MG tablet Commonly known as: ALDACTONE   tamsulosin 0.4 MG Caps capsule Commonly known as: FLOMAX     TAKE these medications   acetaminophen 500 MG tablet Commonly known as: TYLENOL Take 500 mg by mouth every 6 (six) hours as needed (pain.).   amiodarone 200 MG tablet Commonly known as: PACERONE Take 1 tablet (200 mg  total) by mouth 2 (two) times daily.   magic mouthwash w/lidocaine Soln Take 15 mLs by mouth 3 (three) times daily.   metoprolol tartrate 50 MG tablet Commonly known as: LOPRESSOR Take 1 tablet (50 mg total) by mouth 2 (two)  times daily. What changed:   medication strength  how much to take   oxyCODONE-acetaminophen 5-325 MG tablet Commonly known as: PERCOCET/ROXICET Take 1 tablet by mouth every 8 (eight) hours as needed for severe pain.   Roweepra 500 MG tablet Generic drug: levETIRAcetam Take 500 mg by mouth 2 (two) times daily.   simethicone 80 MG chewable tablet Commonly known as: MYLICON Chew 1 tablet (80 mg total) by mouth 4 (four) times daily as needed for flatulence.       Allergies  Allergen Reactions  . Poison Oak Extract Rash  . Poison Ivy Extract Rash    Consultations:  Oncology  Urology  IR  Cardiology  Procedures/Studies: DG Abd 1 View  Result Date: 02/18/2020 CLINICAL DATA:  Dyspnea and abdominal pain. Per physician notes - States he feels dyspnea with exertion as he recently used the restroom with Physical Therapy. Denies chest pain. Patient also denies any symptoms when he went into A fib with RVR last evening. Hx - prostate cancer, DM, HTN, PNA, former smoker. EXAM: ABDOMEN - 1 VIEW COMPARISON:  CT, 02/13/2020 FINDINGS: Increased bowel gas, but no bowel dilation to suggest obstruction. A catheter projects in the lower pelvis consistent with a urinary catheter. There are 3 radiation therapy markers projecting in the region of the prostate gland. A calcification is noted in the right upper quadrant consistent with a gallstone as was noted on the recent CT. There are scattered aortic and iliac atherosclerotic vascular calcifications. Soft tissues are poorly defined but otherwise unremarkable. No acute skeletal abnormality. Lung base nodules are incompletely imaged, better noted on the recent prior CT. IMPRESSION: 1. No acute findings.  No evidence of bowel obstruction. Electronically Signed   By: Lajean Manes M.D.   On: 02/18/2020 17:17   CT ANGIO CHEST PE W OR WO CONTRAST  Result Date: 02/13/2020 CLINICAL DATA:  Atrial fibrillation, elevated D-dimer, prostate cancer EXAM:  CT ANGIOGRAPHY CHEST WITH CONTRAST TECHNIQUE: Multidetector CT imaging of the chest was performed using the standard protocol during bolus administration of intravenous contrast. Multiplanar CT image reconstructions and MIPs were obtained to evaluate the vascular anatomy. CONTRAST:  25mL OMNIPAQUE IOHEXOL 350 MG/ML SOLN COMPARISON:  None. FINDINGS: Cardiovascular: There is adequate opacification of the pulmonary arterial tree. There are multiple branching central intraluminal filling defects identified within the right upper and right lower lobar pulmonary arteries as well as multiple segmental pulmonary arteries of the left lower lobe in keeping with acute pulmonary embolism. The central pulmonary arteries are enlarged in keeping with changes of pulmonary arterial hypertension. However, there is no CT evidence of right heart strain with the right to left ventricular ratio within normal limits. Moderate coronary artery calcification. Cardiac size is mildly enlarged. Moderate calcification of the mitral valve annulus. No pericardial effusion. The thoracic aorta is of normal caliber. Mild atherosclerotic calcification is seen within the aortic arch and descending thoracic aorta. Mediastinum/Nodes: There is pathologic bilateral hilar adenopathy. Index lymph node within the right hilum measures 16 mm x 25 mm at axial image # 42/4. Thyroid unremarkable. Esophagus unremarkable. Lungs/Pleura: Multiple bilateral pulmonary masses are identified most in keeping with pulmonary metastatic disease in this patient with a known history of prostate cancer. Index lesion within the right  lower lobe measures 3.5 x 5.2 cm at axial image # 62/6. At least 50 nodules are seen scattered throughout the lungs bilaterally. No pneumothorax or pleural effusion. The central airways are widely patent. Upper Abdomen: Unremarkable Musculoskeletal: Multiple sclerotic metastases are identified within the visualized axial skeleton involving the  scapula bilaterally, the sternum, the thoracic spine, and multiple ribs bilaterally. No pathologic fracture. Review of the MIP images confirms the above findings. IMPRESSION: Acute pulmonary embolism. Moderate embolic burden. No CT evidence of right heart strain. Moderate coronary artery calcification.  Mild global cardiomegaly. Innumerable bilateral pulmonary masses and nodules as well as bilateral pathologic hilar adenopathy and widespread sclerotic osseous metastases in keeping with visceral and osseous metastatic disease related to the patient's underlying prostate cancer. Aortic Atherosclerosis (ICD10-I70.0). Electronically Signed   By: Fidela Salisbury MD   On: 02/13/2020 23:27   CT PELVIS WO CONTRAST  Result Date: 02/20/2020 CLINICAL DATA:  Urinary retention, prostate cancer, urethral stricture EXAM: CT PELVIS WITHOUT CONTRAST TECHNIQUE: Multidetector CT imaging of the pelvis was performed following the standard protocol without intravenous contrast. COMPARISON:  02/13/20. FINDINGS: Urinary Tract: Foley catheter balloon is again seen within the bladder lumen. Despite this, the bladder appears mildly distended, raising the question of obstruction of the catheter. There are numerous layering calcifications within the bladder lumen, slightly improved from prior examination but persistent. Additionally, a small amount of calcific debris is seen within the Foley catheter lumen suggesting partial obstruction secondary to debris. Gas is seen within the bladder lumen related to probable catheterization. There is mild perivesicular inflammatory stranding, similar to prior examination, likely infectious or inflammatory in nature. There is again noted mild right and moderate left hydroureter which appears similar to that seen on prior examination suggesting obstruction of the ureters bilaterally at the ureterovesicular junctions. Locally invasive prostatic mass with invasion of the bladder trigone is again noted.  Brachytherapy seeds are seen within the prostate gland. Bowel: Small broad-based umbilical hernia is again identified containing a single wall of the otherwise unremarkable mid transverse colon. The visualized large and small bowel are otherwise unremarkable. Appendix normal. No free fluid within the pelvis. Vascular/Lymphatic: Extensive aortoiliac atherosclerotic calcification. No aortic aneurysm. No pathologic adenopathy within the pelvis. Reproductive: Microlobulated prostatic mass invades the base of the bladder as described above. This is similar in appearance to prior examination. Other: Small fat containing left inguinal hernia noted. Rectum unremarkable. Musculoskeletal: Sclerotic metastases are again identified within the visualized axial skeleton in keeping with osseous metastatic disease. Similarly, 4.7 cm soft tissue mass is seen within the subcutaneous fat of the left gluteal region in keeping with a soft tissue metastasis. IMPRESSION: Stable examination. Prostatic mass invades the base of the bladder with resultant obstruction of the ureterovesicular junctions bilaterally and mild-to-moderate hydroureter noted bilaterally. Foley catheter balloon remains within the bladder lumen, however, mild bladder distension persists and calcific debris within the catheter lumen suggests partial obstruction due to debris. Layering bladder calculi again noted, slightly improved from prior examination possibly related to fragmentation. Perivesicular inflammatory stranding suggesting persistent infectious or inflammatory cystitis. Sclerotic osseous and subcutaneous soft tissue metastatic disease again identified. Aortic Atherosclerosis (ICD10-I70.0). Electronically Signed   By: Fidela Salisbury MD   On: 02/20/2020 16:05   [place] Intraoperative  Result Date: 02/23/2020 CLINICAL DATA:  Ultrasound was provided for use by the ordering physician.  No provider Interpretation or professional fees incurred.    US  RENAL  Result Date: 02/19/2020 CLINICAL DATA:  Acute renal injury EXAM: RENAL /  URINARY TRACT ULTRASOUND COMPLETE COMPARISON:  CT from 02/13/2020 FINDINGS: Right Kidney: Renal measurements: 13.0 x 6.8 x 5.7 cm. = volume: 266 mL. Mild to moderate hydronephrosis is noted. Left Kidney: Renal measurements: 14.8 x 7.1 x 6.1 cm. = volume: 333 mL. Moderate to severe hydronephrosis is noted on the left. Additionally there is a 3.9 cm cystic lesion in the lower pole which appears simple in nature. Bladder: Decompressed by Foley catheter. Multiple bladder calculi are again identified and stable. Other: None. IMPRESSION: Stable appearing hydronephrosis bilaterally worse on the left than the right this is stable from prior CT examination. Left renal cyst. Multiple bladder calculi. Electronically Signed   By: Inez Catalina M.D.   On: 02/19/2020 20:12   US BIOPSY (LIVER)  Result Date: 02/17/2020 INDICATION: 74 year old with history of prostate cancer and evidence for metastatic disease in the chest and liver. Tissue diagnosis is needed. EXAM: ULTRASOUND-GUIDED LIVER LESION BIOPSY MEDICATIONS: None. ANESTHESIA/SEDATION: Moderate (conscious) sedation was employed during this procedure. A total of Versed 1.0 mg and Fentanyl 100 mcg was administered intravenously. Moderate Sedation Time: 16 minutes. The patient's level of consciousness and vital signs were monitored continuously by radiology nursing throughout the procedure under my direct supervision. FLUOROSCOPY TIME:  None COMPLICATIONS: None immediate. PROCEDURE: Informed written consent was obtained from the patient after a thorough discussion of the procedural risks, benefits and alternatives. All questions were addressed. A timeout was performed prior to the initiation of the procedure. The liver was evaluated with ultrasound. Hypoechoic lesion along the inferior right hepatic lobe was identified and targeted for biopsy. The right side of the abdomen was prepped with  chlorhexidine and sterile field was created. Skin and soft tissues were anesthetized using 1% lidocaine. Small incision was made. Using ultrasound guidance, 17 gauge coaxial needle was directed into the right hepatic lobe and into the hypoechoic lesion. Total of 3 core biopsies were obtained with 18 gauge core device. Gel-Foam slurry was injected through the 17 gauge coaxial needle as it was removed. Bandage placed over the puncture site. FINDINGS: Scattered hypoechoic lesions in the liver. Lesion in the inferior right hepatic lobe was sampled. Biopsy needle was confirmed within the lesion. Adequate specimens were obtained and placed in formalin. No immediate bleeding or hematoma formation. IMPRESSION: Ultrasound-guided core biopsy of right hepatic lesion. Electronically Signed   By: Markus Daft M.D.   On: 02/17/2020 16:16   US Venous Img Lower Bilateral (DVT)  Result Date: 02/14/2020 CLINICAL DATA:  Pulmonary embolus EXAM: LEFT LOWER EXTREMITY VENOUS DOPPLER ULTRASOUND TECHNIQUE: Gray-scale sonography with graded compression, as well as color Doppler and duplex ultrasound were performed to evaluate the lower extremity deep venous systems from the level of the common femoral vein and including the common femoral, femoral, profunda femoral, popliteal and calf veins including the posterior tibial, peroneal and gastrocnemius veins when visible. The superficial great saphenous vein was also interrogated. Spectral Doppler was utilized to evaluate flow at rest and with distal augmentation maneuvers in the common femoral, femoral and popliteal veins. COMPARISON:  None. FINDINGS: Contralateral Common Femoral Vein: Respiratory phasicity is normal and symmetric with the symptomatic side. No evidence of thrombus. Normal compressibility. Common Femoral Vein: No evidence of thrombus. Normal compressibility, respiratory phasicity and response to augmentation. Saphenofemoral Junction: No evidence of thrombus. Normal  compressibility and flow on color Doppler imaging. Profunda Femoral Vein: No evidence of thrombus. Normal compressibility and flow on color Doppler imaging. Femoral Vein: No evidence of thrombus. Normal compressibility, respiratory phasicity and response to  augmentation. Popliteal Vein: No evidence of thrombus. Normal compressibility, respiratory phasicity and response to augmentation. Calf Veins: There is nonocclusive thrombus within the left peroneal veins. Superficial Great Saphenous Vein: No evidence of thrombus. Normal compressibility. Venous Reflux:  None. Other Findings:  None. IMPRESSION: Nonocclusive thrombus within the left peroneal veins. Electronically Signed   By: Ulyses Jarred M.D.   On: 02/14/2020 01:36   DG Chest Port 1 View  Result Date: 02/20/2020 CLINICAL DATA:  Dyspnea EXAM: PORTABLE CHEST 1 VIEW COMPARISON:  02/18/2020, CT 02/13/2020 FINDINGS: Multiple bilateral lung nodules. No acute consolidation or effusion. Enlarged cardiomediastinal silhouette. Bilateral hilar enlargement likely due to nodes. No pneumothorax. IMPRESSION: 1. No acute airspace disease. 2. Bilateral pulmonary nodules/masses and hilar enlargement, consistent with metastatic disease. Electronically Signed   By: Donavan Foil M.D.   On: 02/20/2020 18:12   DG Chest Port 1 View  Result Date: 02/18/2020 CLINICAL DATA:  Dyspnea, abdominal pain EXAM: PORTABLE CHEST 1 VIEW COMPARISON:  Chest radiographs, 11/13/2015, CT chest 02/13/2020 FINDINGS: The heart size and mediastinal contours are within normal limits. Low volume AP portable examination without acute abnormality. Multiple bilateral pulmonary masses and nodules. The visualized skeletal structures are unremarkable. IMPRESSION: 1. No acute abnormality of the lungs in low volume AP portable examination. 2. Multiple bilateral pulmonary masses and nodules as seen on prior CT and in keeping with metastatic disease. Electronically Signed   By: Eddie Candle M.D.   On:  02/18/2020 17:15   DG OR UROLOGY CYSTO IMAGE ([place])  Result Date: 02/07/2020 There is no interpretation for this exam.  This order is for images obtained during a surgical procedure.  Please See "Surgeries" Tab for more information regarding the procedure.   ECHOCARDIOGRAM COMPLETE  Result Date: 02/14/2020    ECHOCARDIOGRAM REPORT   Patient Name:   THOS MATSUMOTO Date of Exam: 02/14/2020 Medical Rec #:  [id number]       Height:       73.0 in Accession #:    [id number]      Weight:       270.0 lb Date of Birth:  1946/01/19        BSA:          2.444 m Patient Age:    25 years        BP:           132/71 mmHg Patient Gender: M               HR:           70 bpm. Exam Location:  [facility] Procedure: 2D Echo, Color Doppler, Cardiac Doppler and Intracardiac            Opacification Agent Indications:     I48.91 Atrial Fibrillation  History:         Patient has prior history of Echocardiogram examinations.                  Stroke; Risk Factors:Sleep Apnea, Hypertension, Dyslipidemia                  and Diabetes.  Sonographer:     Charmayne Sheer RDCS (AE) Referring Phys:  [id number] [place] Diagnosing Phys: Neoma Laming MD  Sonographer Comments: Technically difficult study due to poor echo windows. Image acquisition challenging due to patient body habitus. IMPRESSIONS  1. Left ventricular ejection fraction, by estimation, is 60 to 65%. The left ventricle has normal function. The left ventricle has no regional  wall motion abnormalities. There is mild left ventricular hypertrophy. Left ventricular diastolic parameters are consistent with Grade I diastolic dysfunction (impaired relaxation).  2. Right ventricular systolic function is normal. The right ventricular size is normal.  3. Left atrial size was mildly dilated.  4. Right atrial size was mildly dilated.  5. The mitral valve is normal in structure. No evidence of mitral valve regurgitation. No evidence of mitral stenosis.  6. The aortic valve is normal in  structure. Aortic valve regurgitation is not visualized. Mild aortic valve sclerosis is present, with no evidence of aortic valve stenosis.  7. The inferior vena cava is normal in size with greater than 50% respiratory variability, suggesting right atrial pressure of 3 mmHg. FINDINGS  Left Ventricle: Left ventricular ejection fraction, by estimation, is 60 to 65%. The left ventricle has normal function. The left ventricle has no regional wall motion abnormalities. Definity contrast agent was given IV to delineate the left ventricular  endocardial borders. The left ventricular internal cavity size was normal in size. There is mild left ventricular hypertrophy. Left ventricular diastolic parameters are consistent with Grade I diastolic dysfunction (impaired relaxation). Right Ventricle: The right ventricular size is normal. No increase in right ventricular wall thickness. Right ventricular systolic function is normal. Left Atrium: Left atrial size was mildly dilated. Right Atrium: Right atrial size was mildly dilated. Pericardium: There is no evidence of pericardial effusion. Mitral Valve: The mitral valve is normal in structure. No evidence of mitral valve regurgitation. No evidence of mitral valve stenosis. MV peak gradient, 6.0 mmHg. The mean mitral valve gradient is 2.0 mmHg. Tricuspid Valve: The tricuspid valve is normal in structure. Tricuspid valve regurgitation is not demonstrated. No evidence of tricuspid stenosis. Aortic Valve: The aortic valve is normal in structure. Aortic valve regurgitation is not visualized. Mild aortic valve sclerosis is present, with no evidence of aortic valve stenosis. Aortic valve mean gradient measures 3.0 mmHg. Aortic valve peak gradient measures 7.3 mmHg. Pulmonic Valve: The pulmonic valve was normal in structure. Pulmonic valve regurgitation is not visualized. No evidence of pulmonic stenosis. Aorta: The aortic root is normal in size and structure. Venous: The inferior vena  cava is normal in size with greater than 50% respiratory variability, suggesting right atrial pressure of 3 mmHg. IAS/Shunts: No atrial level shunt detected by color flow Doppler.   LV Volumes (MOD) LV vol d, MOD A4C: 91.4 ml Diastology LV vol s, MOD A4C: 26.8 ml LV e' medial:    8.81 cm/s LV SV MOD A4C:     91.4 ml LV E/e' medial:  7.1                            LV e' lateral:   12.40 cm/s                            LV E/e' lateral: 5.0  AORTIC VALVE AV Vmax:           135.00 cm/s AV Vmean:          84.100 cm/s AV VTI:            0.227 m AV Peak Grad:      7.3 mmHg AV Mean Grad:      3.0 mmHg LVOT Vmax:         84.20 cm/s LVOT Vmean:        56.100 cm/s LVOT VTI:  0.108 m LVOT/AV VTI ratio: 0.48 MITRAL VALVE MV Area (PHT): 4.49 cm    SHUNTS MV Peak grad:  6.0 mmHg    Systemic VTI: 0.11 m MV Mean grad:  2.0 mmHg MV Vmax:       1.22 m/s MV Vmean:      64.5 cm/s MV Decel Time: 169 msec MV E velocity: 62.60 cm/s MV A velocity: 85.30 cm/s MV E/A ratio:  0.73 Neoma Laming MD Electronically signed by Neoma Laming MD Signature Date/Time: 02/14/2020/4:30:36 PM    Final    CT Renal Stone Study  Result Date: 02/13/2020 CLINICAL DATA:  Prostate cancer. Pink colored urine. Hypotension. Flank pain. EXAM: CT ABDOMEN AND PELVIS WITHOUT CONTRAST TECHNIQUE: Multidetector CT imaging of the abdomen and pelvis was performed following the standard protocol without IV contrast. COMPARISON:  12/23/2017 CT abdomen/pelvis. FINDINGS: Lower chest: Numerous (greater than 20) solid pulmonary nodules and masses at both lung bases, largest 5.2 cm in the basilar right lower lobe (series 4/image 27) and 5.5 cm in the basilar left lower lobe (series 4/image 27), all new. Atherosclerosis. Hepatobiliary: Multiple (at least 5) hypodense liver masses scattered throughout the liver, largest 3.8 cm in the segment 6 right liver lobe (series 2/image 36) and 2.0 cm in the superior right liver (series 2/image 22), all new. Cholelithiasis. No  gallbladder wall thickening or pericholecystic fluid. No biliary ductal dilatation. Pancreas: Normal, with no mass or duct dilation. Spleen: Normal size. No mass. Adrenals/Urinary Tract: Normal right adrenal. Left adrenal 1.5 cm nodule with density 17 HU, stable, most compatible with an adenoma. Moderate bilateral hydroureteronephrosis to the level of the ureterovesical junctions bilaterally. No renal stones. No ureteral stones. Simple 3.7 cm posterior lower left renal cyst. No additional contour deforming renal lesions. Multiple layering stones in distended bladder, largest 19 mm. Chronic mild diffuse bladder wall thickening is unchanged. Foley catheter in place with balloon in the dependent bladder. Stomach/Bowel: Normal non-distended stomach. Normal caliber small bowel with no small bowel wall thickening. Normal appendix. Normal large bowel with no diverticulosis, large bowel wall thickening or pericolonic fat stranding. Vascular/Lymphatic: Atherosclerotic nonaneurysmal abdominal aorta. No pathologically enlarged lymph nodes in the abdomen or pelvis. Reproductive: Newly mildly enlarged, irregular and poorly marginated prostate, directly contiguous with the bladder trigone. Fiducial markers noted in the prostate as before. Other: No pneumoperitoneum, ascites or focal fluid collection. Solid subcutaneous 2.9 cm lateral right chest wall mass (series 2/image 29), new. Similar solid subcutaneous 4.4 cm left gluteal mass (series 2/image 63). Small fat containing umbilical hernia is stable. Musculoskeletal: Numerous new sclerotic osseous lesions throughout the visualized skeleton including multiple lower ribs bilaterally, multiple thoracolumbar vertebrae largest at T11 and throughout the bilateral pelvic girdle, most prominent in the left iliac crest (series 2/image 60). Marked lumbar spondylosis. IMPRESSION: 1. Numerous new solid pulmonary nodules and masses at both lung bases, largest 5.2 cm in the basilar right lower  lobe, compatible with pulmonary metastases. 2. New widespread sclerotic osseous metastases throughout the visualized skeleton as detailed. 3. Multiple new hypodense liver masses compatible with liver metastases. 4. Newly mildly enlarged, irregular and poorly marginated prostate, directly contiguous with the bladder trigone, suggestive of recurrent locally advanced prostate cancer. 5. Moderate bilateral hydroureteronephrosis to the level of the UVJ bilaterally, probably due to bladder outlet obstruction despite the well-positioned Foley catheter in the bladder. 6. Multiple layering bladder stones in the distended bladder. 7. Cholelithiasis. 8. Stable left adrenal adenoma. 9. Aortic Atherosclerosis (ICD10-I70.0). Electronically Signed   By: Ilona Sorrel  M.D.   On: 02/13/2020 10:06   IR NEPHROSTOMY PLACEMENT LEFT  Result Date: 02/23/2020 CLINICAL DATA:  Widely metastatic prostate carcinoma and obstructive uropathy due to bleeding and clots at the level of the bladder leading to obstruction indwelling Foley catheters. Request has been made to place bilateral percutaneous nephrostomy tubes for urinary drainage. EXAM: 1. ULTRASOUND GUIDANCE FOR PUNCTURE OF THE LEFT RENAL COLLECTING SYSTEM. 2. LEFT PERCUTANEOUS NEPHROSTOMY TUBE PLACEMENT. 3. ULTRASOUND GUIDANCE FOR PUNCTURE OF THE RIGHT RENAL COLLECTING SYSTEM. 4. RIGHT PERCUTANEOUS NEPHROSTOMY TUBE PLACEMENT. COMPARISON:  None. ANESTHESIA/SEDATION: 25 mcg IV Fentanyl. Total Moderate Sedation Time: 20 minutes. The patient's level of consciousness and physiologic status were continuously monitored during the procedure by Radiology nursing. CONTRAST:  10 ml Visipaque 320 MEDICATIONS: 2 g IV Ancef. Antibiotic was administered in an appropriate time frame prior to skin puncture. FLUOROSCOPY TIME:  1 minute and 54 seconds.  24.3 mGy. PROCEDURE: The procedure, risks, benefits, and alternatives were explained to the patient and his wife. Questions regarding the procedure  were encouraged and answered. The patient's wife understands and consents to the procedure. A time-out was performed prior to initiating the procedure. Bilateral flank regions were prepped with chlorhexidine in a sterile fashion, and a sterile drape was applied covering the operative field. A sterile gown and sterile gloves were used for the procedure. Local anesthesia was provided with 1% Lidocaine. Ultrasound was used to localize the left kidney. Under direct ultrasound guidance, a 21 gauge needle was advanced into the renal collecting system. Ultrasound image documentation was performed. Aspiration of urine sample was performed followed by contrast injection. A transitional dilator was advanced over a guidewire. Percutaneous tract dilatation was then performed over the guidewire. A 10-French percutaneous nephrostomy tube was then advanced and formed in the collecting system. Catheter position was confirmed by fluoroscopy after contrast injection. Ultrasound was used to localize the right kidney. Under direct ultrasound guidance, a 21 gauge needle was advanced into the collecting system. Ultrasound image documentation was performed. After aspiration of urine, contrast was injected. A transitional dilator was advanced. The tract was dilated and a 10 French percutaneous nephrostomy tube advanced over the wire and formed. Catheter positioning was confirmed by fluoroscopy after contrast injection. Both catheters were secured at the skin with Prolene retention sutures and Stat-Lock devices. Both were connected to gravity drainage bags. COMPLICATIONS: None. FINDINGS: Initial ultrasound demonstrates hydronephrosis of both kidneys, left greater than right. The left percutaneous nephrostomy tube was placed via upper pole access with the tube formed at the level of the renal pelvis. There is return of bloody urine after tube placement. The right percutaneous nephrostomy tube was placed via interpolar access and formed at  the level of the renal pelvis. There is return of urine containing significant debris. IMPRESSION: Bilateral 10 French percutaneous nephrostomy tube placement utilizing ultrasound and fluoroscopy. Both tubes were formed at the level of the renal pelvis and connected to gravity bag drainage. Electronically Signed   By: Aletta Edouard M.D.   On: 02/23/2020 15:50   IR NEPHROSTOMY PLACEMENT RIGHT  Result Date: 02/23/2020 CLINICAL DATA:  Widely metastatic prostate carcinoma and obstructive uropathy due to bleeding and clots at the level of the bladder leading to obstruction indwelling Foley catheters. Request has been made to place bilateral percutaneous nephrostomy tubes for urinary drainage. EXAM: 1. ULTRASOUND GUIDANCE FOR PUNCTURE OF THE LEFT RENAL COLLECTING SYSTEM. 2. LEFT PERCUTANEOUS NEPHROSTOMY TUBE PLACEMENT. 3. ULTRASOUND GUIDANCE FOR PUNCTURE OF THE RIGHT RENAL COLLECTING SYSTEM. 4. RIGHT PERCUTANEOUS NEPHROSTOMY  TUBE PLACEMENT. COMPARISON:  None. ANESTHESIA/SEDATION: 25 mcg IV Fentanyl. Total Moderate Sedation Time: 20 minutes. The patient's level of consciousness and physiologic status were continuously monitored during the procedure by Radiology nursing. CONTRAST:  10 ml Visipaque 320 MEDICATIONS: 2 g IV Ancef. Antibiotic was administered in an appropriate time frame prior to skin puncture. FLUOROSCOPY TIME:  1 minute and 54 seconds.  24.3 mGy. PROCEDURE: The procedure, risks, benefits, and alternatives were explained to the patient and his wife. Questions regarding the procedure were encouraged and answered. The patient's wife understands and consents to the procedure. A time-out was performed prior to initiating the procedure. Bilateral flank regions were prepped with chlorhexidine in a sterile fashion, and a sterile drape was applied covering the operative field. A sterile gown and sterile gloves were used for the procedure. Local anesthesia was provided with 1% Lidocaine. Ultrasound was used to  localize the left kidney. Under direct ultrasound guidance, a 21 gauge needle was advanced into the renal collecting system. Ultrasound image documentation was performed. Aspiration of urine sample was performed followed by contrast injection. A transitional dilator was advanced over a guidewire. Percutaneous tract dilatation was then performed over the guidewire. A 10-French percutaneous nephrostomy tube was then advanced and formed in the collecting system. Catheter position was confirmed by fluoroscopy after contrast injection. Ultrasound was used to localize the right kidney. Under direct ultrasound guidance, a 21 gauge needle was advanced into the collecting system. Ultrasound image documentation was performed. After aspiration of urine, contrast was injected. A transitional dilator was advanced. The tract was dilated and a 10 French percutaneous nephrostomy tube advanced over the wire and formed. Catheter positioning was confirmed by fluoroscopy after contrast injection. Both catheters were secured at the skin with Prolene retention sutures and Stat-Lock devices. Both were connected to gravity drainage bags. COMPLICATIONS: None. FINDINGS: Initial ultrasound demonstrates hydronephrosis of both kidneys, left greater than right. The left percutaneous nephrostomy tube was placed via upper pole access with the tube formed at the level of the renal pelvis. There is return of bloody urine after tube placement. The right percutaneous nephrostomy tube was placed via interpolar access and formed at the level of the renal pelvis. There is return of urine containing significant debris. IMPRESSION: Bilateral 10 French percutaneous nephrostomy tube placement utilizing ultrasound and fluoroscopy. Both tubes were formed at the level of the renal pelvis and connected to gravity bag drainage. Electronically Signed   By: Aletta Edouard M.D.   On: 02/23/2020 15:50   US Abdomen Limited RUQ  Result Date: 02/17/2020 CLINICAL  DATA:  74 year old with history of prostate cancer. Suspicious liver lesions on noncontrast CT. EXAM: ULTRASOUND ABDOMEN LIMITED RIGHT UPPER QUADRANT COMPARISON:  CT renal stone protocol 02/13/2020 FINDINGS: Gallbladder: Echogenic stone at the base of the gallbladder measuring up to 1.0 cm. Common bile duct: Diameter: 0.5 cm Liver: Liver parenchyma is mildly heterogeneous. There are scattered hypoechoic liver lesions that correspond with the previous CT findings. Round hypoechoic lesion in the right hepatic lobe measures up to 2.5 cm. Round hypoechoic lesion along the inferior margin of the right hepatic lobe measures up to 2.3 cm. Large hypoechoic lesion along the posterior aspect of the liver measures up to 4.2 cm. Portal vein is patent on color Doppler imaging with normal direction of blood flow towards the liver. Other: Partially visualized right kidney demonstrates dilatation of the right renal collecting system and similar to the recent CT. IMPRESSION: 1. Hypoechoic liver lesions. Findings are suggestive for metastatic disease. Patient  is scheduled for ultrasound-guided core biopsy of a hepatic lesion. 2. Cholelithiasis. No evidence for gallbladder inflammation or biliary dilatation. Electronically Signed   By: Markus Daft M.D.   On: 02/17/2020 09:33     Subjective: Patient appears very lethargic and tachypneic.  Per nursing staff he was refusing morphine earlier.  Discussed with him that it will help with his breathing and pain he agrees to take some.  Discharge Exam: Vitals:   02/24/20 0331 02/24/20 0700  BP: 112/70 112/61  Pulse: 71 73  Resp: (!) 29 (!) 25  Temp:  98.8 F (37.1 C)  SpO2: 93% 93%   Vitals:   02/23/20 1933 02/23/20 2000 02/24/20 0331 02/24/20 0700  BP: 134/70  112/70 112/61  Pulse: 93  71 73  Resp: (!) 30  (!) 29 (!) 25  Temp:    98.8 F (37.1 C)  TempSrc:      SpO2: 94% 96% 93% 93%  Weight:      Height:        General: Pt is  awake, not in acute distress, not  tachypneic and tachycardic. Cardiovascular: RRR, S1/S2 +, no rubs, no gallops Respiratory: CTA bilaterally, no wheezing, no rhonchi Abdominal: Soft, NT, ND, bowel sounds + Extremities: no edema, no cyanosis   The results of significant diagnostics from this hospitalization (including imaging, microbiology, ancillary and laboratory) are listed below for reference.    Microbiology: Recent Results (from the past 240 hour(s))  Urine Culture     Status: None   Collection Time: 02/20/20  5:17 PM   Specimen: Urine, Random  Result Value Ref Range Status   Specimen Description   Final    URINE, RANDOM Performed at [facility], [address], [place], [place] [address]    Special Requests   Final    NONE Performed at [facility], [address] [place], [place] [address]    Culture   Final    NO GROWTH Performed at [facility], [place] [address] [place], [place] [address]    Report Status 02/22/2020 FINAL  Final     Labs: BNP (last 3 results) Recent Labs    02/13/20 1022  BNP 75.1   Basic Metabolic Panel: Recent Labs  Lab 02/17/20 2333 02/18/20 0435 02/19/20 0319 02/20/20 0257 02/21/20 0422  NA  --  133* 135 135 136  K 4.0 4.0 5.0 5.1 5.0  CL  --  103 104 102 105  CO2  --  19* 16* 17* 18*  GLUCOSE  --  158* 138* 136* 135*  BUN  --  39* 60* 78* 88*  CREATININE  --  1.72* 3.45* 4.06* 2.98*  CALCIUM  --  9.0 9.1 9.4 9.1  MG 1.9 2.5* 2.6* 2.7* 2.7*   Liver Function Tests: No results for input(s): AST, ALT, ALKPHOS, BILITOT, PROT, ALBUMIN in the last 168 hours. No results for input(s): LIPASE, AMYLASE in the last 168 hours. No results for input(s): AMMONIA in the last 168 hours. CBC: Recent Labs  Lab 02/18/20 0435 02/19/20 0319 02/20/20 0257 02/21/20 0422  WBC 11.4* 15.0* 18.2* 15.4*  HGB 9.4* 10.1* 10.6* 9.4*  HCT 28.6* 30.4* 32.1* 29.0*  MCV 89.9 88.9 90.9 90.6  PLT 230 275 304 329   Cardiac Enzymes: No results for input(s):  CKTOTAL, CKMB, CKMBINDEX, TROPONINI in the last 168 hours. BNP: Invalid input(s): POCBNP CBG: Recent Labs  Lab 02/20/20 2053  GLUCAP 139*   D-Dimer No results for input(s): DDIMER in  the last 72 hours. Hgb A1c No results for input(s): HGBA1C in the last 72 hours. Lipid Profile No results for input(s): CHOL, HDL, LDLCALC, TRIG, CHOLHDL, LDLDIRECT in the last 72 hours. Thyroid function studies No results for input(s): TSH, T4TOTAL, T3FREE, THYROIDAB in the last 72 hours.  Invalid input(s): FREET3 Anemia work up No results for input(s): VITAMINB12, FOLATE, FERRITIN, TIBC, IRON, RETICCTPCT in the last 72 hours. Urinalysis    Component Value Date/Time   COLORURINE BROWN (A) 02/20/2020 1717   APPEARANCEUR CLOUDY (A) 02/20/2020 1717   APPEARANCEUR Cloudy (A) 01/19/2020 1435   LABSPEC 1.011 02/20/2020 1717   PHURINE 6.0 02/20/2020 1717   GLUCOSEU 50 (A) 02/20/2020 1717   HGBUR LARGE (A) 02/20/2020 1717   BILIRUBINUR NEGATIVE 02/20/2020 1717   BILIRUBINUR Negative 01/19/2020 1435   KETONESUR NEGATIVE 02/20/2020 1717   PROTEINUR 100 (A) 02/20/2020 1717   NITRITE NEGATIVE 02/20/2020 1717   LEUKOCYTESUR NEGATIVE 02/20/2020 1717   Sepsis Labs Invalid input(s): PROCALCITONIN,  WBC,  LACTICIDVEN Microbiology Recent Results (from the past 240 hour(s))  Urine Culture     Status: None   Collection Time: 02/20/20  5:17 PM   Specimen: Urine, Random  Result Value Ref Range Status   Specimen Description   Final    URINE, RANDOM Performed at [facility], [address], [place], [place] [address]    Special Requests   Final    NONE Performed at [facility], [address] [place], [place] [address]    Culture   Final    NO GROWTH Performed at [facility], [place] [address] [place], [place] [address]    Report Status 02/22/2020 FINAL  Final    Time coordinating discharge: Over 30 minutes  SIGNED:  Lorella Nimrod, MD  [facility] 02/24/2020,  2:31 PM  If 7PM-7AM, please contact night-coverage [email or web address]  This record has been created using Systems analyst. Errors have been sought and corrected,but may not always be located. Such creation errors do not reflect on the standard of care.

## 2020-02-24 NOTE — TOC Transition Note (Signed)
Transition of Care [facility]) - CM/SW Discharge Note   Patient Details  Name: Timothy Maddox MRN: [id number] Date of Birth: 1945/06/16  Transition of Care [facility]) CM/SW Contact:  Beverly Sessions, RN Phone Number: 02/24/2020, 3:26 PM   Clinical Narrative:     Patient to discharge to Hospice home today Family to complete paper work at 3 pm  [place] with [employer] request for EMS transport to be set up for 8pm as patient will not be admitted till 2nd shift.   EMS packet and signed DNR on chart.  Bedside RN to call report   Final next level of care: [place] Barriers to Discharge: No Barriers Identified   Patient Goals and CMS Choice        Discharge Placement                Patient to be transferred to facility by: EMS      Discharge Plan and Services                                     Social Determinants of Health (SDOH) Interventions     Readmission Risk Interventions No flowsheet data found.

## 2020-02-24 NOTE — Progress Notes (Signed)
Patient discharged via EMS Transport. Lines and drains left intact. Patient wife called and informed of discharge. Patient belongings will be picked up 10/23 by wife Toretto Tingler.

## 2020-02-24 NOTE — Progress Notes (Addendum)
[facility]) Hospital Liaison RN note:  Visited patient in room. He appeared to be asleep and resting comfortably so I did not awaken him. Called spouse, Holley Raring to update on patient and bed availability.   Hospice Home does have a bed to offer today. Registration paper work to be completed by spouse, Engineer, maintenance at 3pm at the [facility] Hospital care team is aware. Transport has been arranged for 8 pm. Discharge Summary has been faxed to [facility].  Thank you for the opportunity to participate in this patient's care.  Zandra Abts, RN [facility] Liaison [phone]

## 2020-02-24 NOTE — Progress Notes (Signed)
Hematology/Oncology Progress Note [facility] [phone] [phone]  Patient Care Team: [name], MD as PCP - General (Internal Medicine) Abbie Sons, MD (Urology)   Name of the patient: Timothy Maddox  [id number]  1945-12-01  Date of visit: 02/24/20   INTERVAL HISTORY-  No acute events overnight.  Status post PCN bilaterally yesterday.  Tubes are draining well. Wife at the bedside.  Patient denies any pain today.  Remains on nasal cannula oxygen.  Review of systems- Review of Systems  Constitutional: Positive for fatigue. Negative for chills and fever.  HENT:   Negative for hearing loss and voice change.   Eyes: Negative for eye problems and icterus.  Respiratory: Positive for shortness of breath. Negative for chest tightness and cough.   Cardiovascular: Negative for chest pain.  Gastrointestinal: Negative for abdominal distention.  Endocrine: Negative for hot flashes.  Genitourinary:        Status post PCN bilaterally  Skin: Negative for rash.  Psychiatric/Behavioral: Negative for confusion.    Allergies  Allergen Reactions  . Poison Oak Extract Rash  . Poison Ivy Extract Rash    Patient Active Problem List   Diagnosis Date Noted  . Abdominal pain   . Hydronephrosis   . Obstructive uropathy   . Clot retention of urine   . Acute hypoxemic respiratory failure ([place]) 02/21/2020  . Iron deficiency anemia due to chronic blood loss   . Palliative care encounter   . PE (pulmonary thromboembolism) ([place])   . Liver mass   . Hematuria 02/13/2020  . Hypotension 02/13/2020  . Urinary tract infection associated with indwelling urethral catheter ([place]) 02/13/2020  . Atrial fibrillation with RVR ([place]) 02/13/2020  . Prediabetes   . Hypertension   . Type II diabetes mellitus with renal manifestations ([place])   . AKI (acute kidney injury) ([place])   . Chronic diastolic CHF (congestive heart failure) ([place])   . Stage 3a chronic  kidney disease ([place]) 01/30/2020  . Androgen deprivation therapy 01/30/2020  . Encounter for antineoplastic chemotherapy 09/24/2018  . Weak urine stream 09/24/2018  . Rising PSA level 07/08/2018  . Prostate cancer ([place]) 07/08/2018  . Goals of care, counseling/discussion 12/31/2017  . Osteoarthritis of right knee 07/20/2017  . Seizure ([place]) 12/19/2015  . 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 Seizures ([place]) 12/19/2015  . AVF (arteriovenous fistula) ([place]) 11/16/2015  . ICH (intracerebral hemorrhage) ([place]) 11/16/2015  . Stroke [facility]) 11/13/2015     Past Medical History:  Diagnosis Date  . Anxiety   . Arthritis    lower back, shoulders  . AVM (arteriovenous malformation) brain   . Cancer [facility]    prostate- treated with radiation  . CVA (cerebral infarction) 11/2015  . Depression   . Diabetes mellitus without complication ([place])   . Dyspnea    with exertion  . HOH (hard of hearing)   . Hyperlipidemia   . Hypertension   . Pneumonia    1st grade  . Pre-diabetes   . Prediabetes   . Seizures ([place]) 12/2015  . Sleep apnea   . Stroke [facility])    speech impairment, no weakness     Past Surgical History:  Procedure Laterality Date  . BACK SURGERY  1999   disectomy  . BACK SURGERY     2 surgery- injury  . COLONOSCOPY W/ POLYPECTOMY    . COLONOSCOPY WITH PROPOFOL N/A 02/28/2019   Procedure: COLONOSCOPY WITH PROPOFOL;  Surgeon: Alice Reichert, Benay Pike, MD;  Location: [facility]  ENDOSCOPY;  Service: Gastroenterology;  Laterality: N/A;  . CYSTOSCOPY WITH URETHRAL DILATATION Bilateral 02/07/2020   Procedure: CYSTOSCOPY WITH URETHRAL DILATATION;  Surgeon: Abbie Sons, MD;  Location: [facility] ORS;  Service: Urology;  Laterality: Bilateral;  . HAMMER TOE SURGERY Bilateral   . INGUINAL HERNIA REPAIR Right    per patient when he was in 1st grade  . IR GENERIC HISTORICAL  12/05/2015   IR RADIOLOGIST EVAL & MGMT 12/05/2015 [facility]INTER[facility]  . IR GENERIC HISTORICAL  01/31/2016   IR RADIOLOGIST  EVAL & MGMT 01/31/2016 [facility]  . IR GENERIC HISTORICAL  02/20/2016   IR ANGIO INTRA EXTRACRAN SEL COM CAROTID INNOMINATE UNI L MOD SED 02/20/2016 [name], MD [facility]INTER[facility]  . IR GENERIC HISTORICAL  02/20/2016   IR 3D INDEPENDENT WKST 02/20/2016 [name], MD [facility]INTER[facility]  . IR NEPHROSTOMY PLACEMENT LEFT  02/23/2020  . IR NEPHROSTOMY PLACEMENT RIGHT  02/23/2020  . KNEE SURGERY Bilateral 2015  . RADIOLOGY WITH ANESTHESIA N/A 02/20/2016   Procedure: EMBOLIZATION;  Surgeon: [name], MD;  Location: [place];  Service: Radiology;  Laterality: N/A;  . TOE SURGERY Left 2017   TOE NAIL REMOVAL  . TOTAL KNEE ARTHROPLASTY Right 07/20/2017   Procedure: TOTAL KNEE ARTHROPLASTY;  Surgeon: Lovell Sheehan, MD;  Location: [facility] ORS;  Service: Orthopedics;  Laterality: Right;    Social History   Socioeconomic History  . Marital status: Married    Spouse name: Not on file  . Number of children: Not on file  . Years of education: Not on file  . Highest education level: Not on file  Occupational History  . Occupation: retired Curator  Tobacco Use  . Smoking status: Former Smoker    Packs/day: 1.00    Years: 2.00    Pack years: 2.00    Types: Cigarettes    Quit date: 05/05/1966    Years since quitting: 53.8  . Smokeless tobacco: Never Used  . Tobacco comment: quit age 85  Vaping Use  . Vaping Use: Never used  Substance and Sexual Activity  . Alcohol use: Not Currently  . Drug use: No  . Sexual activity: Not Currently  Other Topics Concern  . Not on file  Social History Narrative  . Not on file   Social Determinants of Health   Financial Resource Strain:   . Difficulty of Paying Living Expenses: Not on file  Food Insecurity:   . Worried About Charity fundraiser in the Last Year: Not on file  . Ran Out of Food in the Last Year: Not on file  Transportation Needs:   . Lack of Transportation (Medical): Not on file  . Lack of Transportation (Non-Medical): Not  on file  Physical Activity:   . Days of Exercise per Week: Not on file  . Minutes of Exercise per Session: Not on file  Stress:   . Feeling of Stress : Not on file  Social Connections:   . Frequency of Communication with Friends and Family: Not on file  . Frequency of Social Gatherings with Friends and Family: Not on file  . Attends Religious Services: Not on file  . Active Member of Clubs or Organizations: Not on file  . Attends Archivist Meetings: Not on file  . Marital Status: Not on file  Intimate Partner Violence:   . Fear of Current or Ex-Partner: Not on file  . Emotionally Abused: Not on file  . Physically Abused: Not on file  . Sexually  Abused: Not on file     Family History  Problem Relation Age of Onset  . Heart failure Father   . Diabetes Father   . Hypertension Father   . Alzheimer's disease Father 109  . Other Mother 101       homicide  . Alcohol abuse Mother   . Pancreatic cancer Sister   . Alcohol abuse Sister   . Diabetes Brother   . Diabetes Paternal Aunt   . Diabetes Paternal Grandmother   . Diabetes Paternal Grandfather      Current Facility-Administered Medications:  .  acetaminophen (TYLENOL) tablet 1,000 mg, 1,000 mg, Oral, Q6H PRN, Enzo Bi, MD, 1,000 mg at 02/22/20 0828 .  amiodarone (PACERONE) tablet 200 mg, 200 mg, Oral, BID, Adaline Sill, NP, 200 mg at 02/24/20 1049 .  ceFAZolin (ANCEF) IVPB 1 g/50 mL premix, , Intravenous, Continuous PRN, Aletta Edouard, MD, 2 g at 02/23/20 1355 .  Chlorhexidine Gluconate Cloth 2 % PADS 6 each, 6 each, Topical, Daily, Ivor Costa, MD, 6 each at 02/23/20 [phone]  fentaNYL (SUBLIMAZE) injection, , Intravenous, PRN, Aletta Edouard, MD, 25 mcg at 02/23/20 1426 .  hydrALAZINE (APRESOLINE) injection 5 mg, 5 mg, Intravenous, Q2H PRN, Ivor Costa, MD .  levETIRAcetam (KEPPRA) tablet 500 mg, 500 mg, Oral, BID, Ivor Costa, MD, 500 mg at 02/24/20 1049 .  LORazepam (ATIVAN) injection 0.5 mg, 0.5 mg,  Intravenous, Q12H PRN, Ivor Costa, MD, 0.5 mg at 02/22/20 0057 .  LORazepam (ATIVAN) injection 0.5-1 mg, 0.5-1 mg, Intravenous, Q2H PRN, Ivor Costa, MD .  magic mouthwash w/lidocaine, 15 mL, Oral, TID, Lorella Nimrod, MD, 15 mL at 02/24/20 1050 .  metoprolol tartrate (LOPRESSOR) tablet 50 mg, 50 mg, Oral, BID, Ivor Costa, MD, 50 mg at 02/24/20 1049 .  morphine 2 MG/ML injection 1 mg, 1 mg, Intravenous, Q2H PRN, Enzo Bi, MD, 1 mg at 02/22/20 1543 .  morphine CONCENTRATE 10 MG/0.5ML oral solution 5 mg, 5 mg, Oral, Q2H PRN, 5 mg at 02/22/20 1308 **OR** morphine CONCENTRATE 10 MG/0.5ML oral solution 5 mg, 5 mg, Sublingual, Q2H PRN, Enzo Bi, MD .  ondansetron [facility]) injection 4 mg, 4 mg, Intravenous, Q6H PRN, Enzo Bi, MD, 4 mg at 02/22/20 1543 .  ondansetron (ZOFRAN-ODT) disintegrating tablet 4 mg, 4 mg, Oral, Q8H PRN, Enzo Bi, MD .  senna-docusate (Senokot-S) tablet 2 tablet, 2 tablet, Oral, Glyn Ade, MD, 2 tablet at 02/18/20 2129 .  simethicone (MYLICON) chewable tablet 80 mg, 80 mg, Oral, QID PRN, Enzo Bi, MD, 80 mg at 02/18/20 1329 .  sodium chloride flush (NS) 0.9 % injection 3 mL, 3 mL, Intravenous, PRN, Lorella Nimrod, MD, 3 mL at 02/14/20 2314 .  sodium chloride flush (NS) 0.9 % injection 5 mL, 5 mL, Intracatheter, Q8H, Aletta Edouard, MD, 5 mL at 02/24/20 0447   Physical exam:  Vitals:   02/23/20 1933 02/23/20 2000 02/24/20 0331 02/24/20 0700  BP: 134/70  112/70 112/61  Pulse: 93  71 73  Resp: (!) 30  (!) 29 (!) 25  Temp:    98.8 F (37.1 C)  TempSrc:      SpO2: 94% 96% 93% 93%  Weight:      Height:       Physical Exam Constitutional:      General: He is not in acute distress.    Appearance: He is obese. He is ill-appearing. He is not diaphoretic.  HENT:     Head: Normocephalic and atraumatic.  Nose: Nose normal.     Mouth/Throat:     Pharynx: No oropharyngeal exudate.  Eyes:     General: No scleral icterus.    Pupils: Pupils are equal, round, and  reactive to light.  Cardiovascular:     Rate and Rhythm: Normal rate and regular rhythm.     Heart sounds: No murmur heard.   Pulmonary:     Effort: Pulmonary effort is normal.     Breath sounds: No rales.     Comments: Breathing via nasal cannula oxygen Chest:     Chest wall: No tenderness.  Abdominal:     General: There is no distension.     Palpations: Abdomen is soft.     Tenderness: There is no abdominal tenderness.  Genitourinary:    Comments: + Foley catheter, + bilateral PCN Musculoskeletal:        General: Normal range of motion.     Cervical back: Normal range of motion and neck supple.  Skin:    General: Skin is warm and dry.     Findings: No erythema.  Neurological:     Mental Status: He is alert and oriented to person, place, and time.     Cranial Nerves: No cranial nerve deficit.     Motor: No abnormal muscle tone.     Coordination: Coordination normal.  Psychiatric:        Mood and Affect: Affect normal.        CMP Latest Ref Rng & Units 02/21/2020  Glucose 70 - 99 mg/dL 135(H)  BUN 8 - 23 mg/dL 88(H)  Creatinine 0.61 - 1.24 mg/dL 2.98(H)  Sodium 135 - 145 mmol/L 136  Potassium 3.5 - 5.1 mmol/L 5.0  Chloride 98 - 111 mmol/L 105  CO2 22 - 32 mmol/L 18(L)  Calcium 8.9 - 10.3 mg/dL 9.1  Total Protein 6.5 - 8.1 g/dL -  Total Bilirubin 0.3 - 1.2 mg/dL -  Alkaline Phos 38 - 126 U/L -  AST 15 - 41 U/L -  ALT 0 - 44 U/L -   CBC Latest Ref Rng & Units 02/21/2020  WBC 4.0 - 10.5 K/uL 15.4(H)  Hemoglobin 13.0 - 17.0 g/dL 9.4(L)  Hematocrit 39 - 52 % 29.0(L)  Platelets 150 - 400 K/uL 329    RADIOGRAPHIC STUDIES: I have personally reviewed the radiological images as listed and agreed with the findings in the report. DG Abd 1 View  Result Date: 02/18/2020 CLINICAL DATA:  Dyspnea and abdominal pain. Per physician notes - States he feels dyspnea with exertion as he recently used the restroom with Physical Therapy. Denies chest pain. Patient also denies any  symptoms when he went into A fib with RVR last evening. Hx - prostate cancer, DM, HTN, PNA, former smoker. EXAM: ABDOMEN - 1 VIEW COMPARISON:  CT, 02/13/2020 FINDINGS: Increased bowel gas, but no bowel dilation to suggest obstruction. A catheter projects in the lower pelvis consistent with a urinary catheter. There are 3 radiation therapy markers projecting in the region of the prostate gland. A calcification is noted in the right upper quadrant consistent with a gallstone as was noted on the recent CT. There are scattered aortic and iliac atherosclerotic vascular calcifications. Soft tissues are poorly defined but otherwise unremarkable. No acute skeletal abnormality. Lung base nodules are incompletely imaged, better noted on the recent prior CT. IMPRESSION: 1. No acute findings.  No evidence of bowel obstruction. Electronically Signed   By: Lajean Manes M.D.   On: 02/18/2020 17:17  CT ANGIO CHEST PE W OR WO CONTRAST  Result Date: 02/13/2020 CLINICAL DATA:  Atrial fibrillation, elevated D-dimer, prostate cancer EXAM: CT ANGIOGRAPHY CHEST WITH CONTRAST TECHNIQUE: Multidetector CT imaging of the chest was performed using the standard protocol during bolus administration of intravenous contrast. Multiplanar CT image reconstructions and MIPs were obtained to evaluate the vascular anatomy. CONTRAST:  62mL OMNIPAQUE IOHEXOL 350 MG/ML SOLN COMPARISON:  None. FINDINGS: Cardiovascular: There is adequate opacification of the pulmonary arterial tree. There are multiple branching central intraluminal filling defects identified within the right upper and right lower lobar pulmonary arteries as well as multiple segmental pulmonary arteries of the left lower lobe in keeping with acute pulmonary embolism. The central pulmonary arteries are enlarged in keeping with changes of pulmonary arterial hypertension. However, there is no CT evidence of right heart strain with the right to left ventricular ratio within normal limits.  Moderate coronary artery calcification. Cardiac size is mildly enlarged. Moderate calcification of the mitral valve annulus. No pericardial effusion. The thoracic aorta is of normal caliber. Mild atherosclerotic calcification is seen within the aortic arch and descending thoracic aorta. Mediastinum/Nodes: There is pathologic bilateral hilar adenopathy. Index lymph node within the right hilum measures 16 mm x 25 mm at axial image # 42/4. Thyroid unremarkable. Esophagus unremarkable. Lungs/Pleura: Multiple bilateral pulmonary masses are identified most in keeping with pulmonary metastatic disease in this patient with a known history of prostate cancer. Index lesion within the right lower lobe measures 3.5 x 5.2 cm at axial image # 62/6. At least 50 nodules are seen scattered throughout the lungs bilaterally. No pneumothorax or pleural effusion. The central airways are widely patent. Upper Abdomen: Unremarkable Musculoskeletal: Multiple sclerotic metastases are identified within the visualized axial skeleton involving the scapula bilaterally, the sternum, the thoracic spine, and multiple ribs bilaterally. No pathologic fracture. Review of the MIP images confirms the above findings. IMPRESSION: Acute pulmonary embolism. Moderate embolic burden. No CT evidence of right heart strain. Moderate coronary artery calcification.  Mild global cardiomegaly. Innumerable bilateral pulmonary masses and nodules as well as bilateral pathologic hilar adenopathy and widespread sclerotic osseous metastases in keeping with visceral and osseous metastatic disease related to the patient's underlying prostate cancer. Aortic Atherosclerosis (ICD10-I70.0). Electronically Signed   By: Fidela Salisbury MD   On: 02/13/2020 23:27   CT PELVIS WO CONTRAST  Result Date: 02/20/2020 CLINICAL DATA:  Urinary retention, prostate cancer, urethral stricture EXAM: CT PELVIS WITHOUT CONTRAST TECHNIQUE: Multidetector CT imaging of the pelvis was performed  following the standard protocol without intravenous contrast. COMPARISON:  02/13/20. FINDINGS: Urinary Tract: Foley catheter balloon is again seen within the bladder lumen. Despite this, the bladder appears mildly distended, raising the question of obstruction of the catheter. There are numerous layering calcifications within the bladder lumen, slightly improved from prior examination but persistent. Additionally, a small amount of calcific debris is seen within the Foley catheter lumen suggesting partial obstruction secondary to debris. Gas is seen within the bladder lumen related to probable catheterization. There is mild perivesicular inflammatory stranding, similar to prior examination, likely infectious or inflammatory in nature. There is again noted mild right and moderate left hydroureter which appears similar to that seen on prior examination suggesting obstruction of the ureters bilaterally at the ureterovesicular junctions. Locally invasive prostatic mass with invasion of the bladder trigone is again noted. Brachytherapy seeds are seen within the prostate gland. Bowel: Small broad-based umbilical hernia is again identified containing a single wall of the otherwise unremarkable mid transverse colon. The  visualized large and small bowel are otherwise unremarkable. Appendix normal. No free fluid within the pelvis. Vascular/Lymphatic: Extensive aortoiliac atherosclerotic calcification. No aortic aneurysm. No pathologic adenopathy within the pelvis. Reproductive: Microlobulated prostatic mass invades the base of the bladder as described above. This is similar in appearance to prior examination. Other: Small fat containing left inguinal hernia noted. Rectum unremarkable. Musculoskeletal: Sclerotic metastases are again identified within the visualized axial skeleton in keeping with osseous metastatic disease. Similarly, 4.7 cm soft tissue mass is seen within the subcutaneous fat of the left gluteal region in  keeping with a soft tissue metastasis. IMPRESSION: Stable examination. Prostatic mass invades the base of the bladder with resultant obstruction of the ureterovesicular junctions bilaterally and mild-to-moderate hydroureter noted bilaterally. Foley catheter balloon remains within the bladder lumen, however, mild bladder distension persists and calcific debris within the catheter lumen suggests partial obstruction due to debris. Layering bladder calculi again noted, slightly improved from prior examination possibly related to fragmentation. Perivesicular inflammatory stranding suggesting persistent infectious or inflammatory cystitis. Sclerotic osseous and subcutaneous soft tissue metastatic disease again identified. Aortic Atherosclerosis (ICD10-I70.0). Electronically Signed   By: Fidela Salisbury MD   On: 02/20/2020 16:05   [place] Intraoperative  Result Date: 02/23/2020 CLINICAL DATA:  Ultrasound was provided for use by the ordering physician.  No provider Interpretation or professional fees incurred.    US RENAL  Result Date: 02/19/2020 CLINICAL DATA:  Acute renal injury EXAM: RENAL / URINARY TRACT ULTRASOUND COMPLETE COMPARISON:  CT from 02/13/2020 FINDINGS: Right Kidney: Renal measurements: 13.0 x 6.8 x 5.7 cm. = volume: 266 mL. Mild to moderate hydronephrosis is noted. Left Kidney: Renal measurements: 14.8 x 7.1 x 6.1 cm. = volume: 333 mL. Moderate to severe hydronephrosis is noted on the left. Additionally there is a 3.9 cm cystic lesion in the lower pole which appears simple in nature. Bladder: Decompressed by Foley catheter. Multiple bladder calculi are again identified and stable. Other: None. IMPRESSION: Stable appearing hydronephrosis bilaterally worse on the left than the right this is stable from prior CT examination. Left renal cyst. Multiple bladder calculi. Electronically Signed   By: Inez Catalina M.D.   On: 02/19/2020 20:12   US BIOPSY (LIVER)  Result Date: 02/17/2020 INDICATION:  74 year old with history of prostate cancer and evidence for metastatic disease in the chest and liver. Tissue diagnosis is needed. EXAM: ULTRASOUND-GUIDED LIVER LESION BIOPSY MEDICATIONS: None. ANESTHESIA/SEDATION: Moderate (conscious) sedation was employed during this procedure. A total of Versed 1.0 mg and Fentanyl 100 mcg was administered intravenously. Moderate Sedation Time: 16 minutes. The patient's level of consciousness and vital signs were monitored continuously by radiology nursing throughout the procedure under my direct supervision. FLUOROSCOPY TIME:  None COMPLICATIONS: None immediate. PROCEDURE: Informed written consent was obtained from the patient after a thorough discussion of the procedural risks, benefits and alternatives. All questions were addressed. A timeout was performed prior to the initiation of the procedure. The liver was evaluated with ultrasound. Hypoechoic lesion along the inferior right hepatic lobe was identified and targeted for biopsy. The right side of the abdomen was prepped with chlorhexidine and sterile field was created. Skin and soft tissues were anesthetized using 1% lidocaine. Small incision was made. Using ultrasound guidance, 17 gauge coaxial needle was directed into the right hepatic lobe and into the hypoechoic lesion. Total of 3 core biopsies were obtained with 18 gauge core device. Gel-Foam slurry was injected through the 17 gauge coaxial needle as it was removed. Bandage placed over the puncture site.  FINDINGS: Scattered hypoechoic lesions in the liver. Lesion in the inferior right hepatic lobe was sampled. Biopsy needle was confirmed within the lesion. Adequate specimens were obtained and placed in formalin. No immediate bleeding or hematoma formation. IMPRESSION: Ultrasound-guided core biopsy of right hepatic lesion. Electronically Signed   By: Markus Daft M.D.   On: 02/17/2020 16:16   US Venous Img Lower Bilateral (DVT)  Result Date: 02/14/2020 CLINICAL DATA:   Pulmonary embolus EXAM: LEFT LOWER EXTREMITY VENOUS DOPPLER ULTRASOUND TECHNIQUE: Gray-scale sonography with graded compression, as well as color Doppler and duplex ultrasound were performed to evaluate the lower extremity deep venous systems from the level of the common femoral vein and including the common femoral, femoral, profunda femoral, popliteal and calf veins including the posterior tibial, peroneal and gastrocnemius veins when visible. The superficial great saphenous vein was also interrogated. Spectral Doppler was utilized to evaluate flow at rest and with distal augmentation maneuvers in the common femoral, femoral and popliteal veins. COMPARISON:  None. FINDINGS: Contralateral Common Femoral Vein: Respiratory phasicity is normal and symmetric with the symptomatic side. No evidence of thrombus. Normal compressibility. Common Femoral Vein: No evidence of thrombus. Normal compressibility, respiratory phasicity and response to augmentation. Saphenofemoral Junction: No evidence of thrombus. Normal compressibility and flow on color Doppler imaging. Profunda Femoral Vein: No evidence of thrombus. Normal compressibility and flow on color Doppler imaging. Femoral Vein: No evidence of thrombus. Normal compressibility, respiratory phasicity and response to augmentation. Popliteal Vein: No evidence of thrombus. Normal compressibility, respiratory phasicity and response to augmentation. Calf Veins: There is nonocclusive thrombus within the left peroneal veins. Superficial Great Saphenous Vein: No evidence of thrombus. Normal compressibility. Venous Reflux:  None. Other Findings:  None. IMPRESSION: Nonocclusive thrombus within the left peroneal veins. Electronically Signed   By: Ulyses Jarred M.D.   On: 02/14/2020 01:36   DG Chest Port 1 View  Result Date: 02/20/2020 CLINICAL DATA:  Dyspnea EXAM: PORTABLE CHEST 1 VIEW COMPARISON:  02/18/2020, CT 02/13/2020 FINDINGS: Multiple bilateral lung nodules. No acute  consolidation or effusion. Enlarged cardiomediastinal silhouette. Bilateral hilar enlargement likely due to nodes. No pneumothorax. IMPRESSION: 1. No acute airspace disease. 2. Bilateral pulmonary nodules/masses and hilar enlargement, consistent with metastatic disease. Electronically Signed   By: Donavan Foil M.D.   On: 02/20/2020 18:12   DG Chest Port 1 View  Result Date: 02/18/2020 CLINICAL DATA:  Dyspnea, abdominal pain EXAM: PORTABLE CHEST 1 VIEW COMPARISON:  Chest radiographs, 11/13/2015, CT chest 02/13/2020 FINDINGS: The heart size and mediastinal contours are within normal limits. Low volume AP portable examination without acute abnormality. Multiple bilateral pulmonary masses and nodules. The visualized skeletal structures are unremarkable. IMPRESSION: 1. No acute abnormality of the lungs in low volume AP portable examination. 2. Multiple bilateral pulmonary masses and nodules as seen on prior CT and in keeping with metastatic disease. Electronically Signed   By: Eddie Candle M.D.   On: 02/18/2020 17:15   DG OR UROLOGY CYSTO IMAGE ([place])  Result Date: 02/07/2020 There is no interpretation for this exam.  This order is for images obtained during a surgical procedure.  Please See "Surgeries" Tab for more information regarding the procedure.   ECHOCARDIOGRAM COMPLETE  Result Date: 02/14/2020    ECHOCARDIOGRAM REPORT   Patient Name:   NIRVAN LABAN Date of Exam: 02/14/2020 Medical Rec #:  [id number]       Height:       73.0 in Accession #:    [id number]  Weight:       270.0 lb Date of Birth:  [birth year]        BSA:          2.444 m Patient Age:    74 years        BP:           132/71 mmHg Patient Gender: M               HR:           70 bpm. Exam Location:  [facility] Procedure: 2D Echo, Color Doppler, Cardiac Doppler and Intracardiac            Opacification Agent Indications:     I48.91 Atrial Fibrillation  History:         Patient has prior history of Echocardiogram examinations.                   Stroke; Risk Factors:Sleep Apnea, Hypertension, Dyslipidemia                  and Diabetes.  Sonographer:     Charmayne Sheer RDCS (AE) Referring Phys:  [id number] [place] Diagnosing Phys: Neoma Laming MD  Sonographer Comments: Technically difficult study due to poor echo windows. Image acquisition challenging due to patient body habitus. IMPRESSIONS  1. Left ventricular ejection fraction, by estimation, is 60 to 65%. The left ventricle has normal function. The left ventricle has no regional wall motion abnormalities. There is mild left ventricular hypertrophy. Left ventricular diastolic parameters are consistent with Grade I diastolic dysfunction (impaired relaxation).  2. Right ventricular systolic function is normal. The right ventricular size is normal.  3. Left atrial size was mildly dilated.  4. Right atrial size was mildly dilated.  5. The mitral valve is normal in structure. No evidence of mitral valve regurgitation. No evidence of mitral stenosis.  6. The aortic valve is normal in structure. Aortic valve regurgitation is not visualized. Mild aortic valve sclerosis is present, with no evidence of aortic valve stenosis.  7. The inferior vena cava is normal in size with greater than 50% respiratory variability, suggesting right atrial pressure of 3 mmHg. FINDINGS  Left Ventricle: Left ventricular ejection fraction, by estimation, is 60 to 65%. The left ventricle has normal function. The left ventricle has no regional wall motion abnormalities. Definity contrast agent was given IV to delineate the left ventricular  endocardial borders. The left ventricular internal cavity size was normal in size. There is mild left ventricular hypertrophy. Left ventricular diastolic parameters are consistent with Grade I diastolic dysfunction (impaired relaxation). Right Ventricle: The right ventricular size is normal. No increase in right ventricular wall thickness. Right ventricular systolic function is normal. Left  Atrium: Left atrial size was mildly dilated. Right Atrium: Right atrial size was mildly dilated. Pericardium: There is no evidence of pericardial effusion. Mitral Valve: The mitral valve is normal in structure. No evidence of mitral valve regurgitation. No evidence of mitral valve stenosis. MV peak gradient, 6.0 mmHg. The mean mitral valve gradient is 2.0 mmHg. Tricuspid Valve: The tricuspid valve is normal in structure. Tricuspid valve regurgitation is not demonstrated. No evidence of tricuspid stenosis. Aortic Valve: The aortic valve is normal in structure. Aortic valve regurgitation is not visualized. Mild aortic valve sclerosis is present, with no evidence of aortic valve stenosis. Aortic valve mean gradient measures 3.0 mmHg. Aortic valve peak gradient measures 7.3 mmHg. Pulmonic Valve: The pulmonic valve was normal in structure. Pulmonic valve regurgitation is  not visualized. No evidence of pulmonic stenosis. Aorta: The aortic root is normal in size and structure. Venous: The inferior vena cava is normal in size with greater than 50% respiratory variability, suggesting right atrial pressure of 3 mmHg. IAS/Shunts: No atrial level shunt detected by color flow Doppler.   LV Volumes (MOD) LV vol d, MOD A4C: 91.4 ml Diastology LV vol s, MOD A4C: 26.8 ml LV e' medial:    8.81 cm/s LV SV MOD A4C:     91.4 ml LV E/e' medial:  7.1                            LV e' lateral:   12.40 cm/s                            LV E/e' lateral: 5.0  AORTIC VALVE AV Vmax:           135.00 cm/s AV Vmean:          84.100 cm/s AV VTI:            0.227 m AV Peak Grad:      7.3 mmHg AV Mean Grad:      3.0 mmHg LVOT Vmax:         84.20 cm/s LVOT Vmean:        56.100 cm/s LVOT VTI:          0.108 m LVOT/AV VTI ratio: 0.48 MITRAL VALVE MV Area (PHT): 4.49 cm    SHUNTS MV Peak grad:  6.0 mmHg    Systemic VTI: 0.11 m MV Mean grad:  2.0 mmHg MV Vmax:       1.22 m/s MV Vmean:      64.5 cm/s MV Decel Time: 169 msec MV E velocity: 62.60 cm/s MV A  velocity: 85.30 cm/s MV E/A ratio:  0.73 Neoma Laming MD Electronically signed by Neoma Laming MD Signature Date/Time: 02/14/2020/4:30:36 PM    Final    CT Renal Stone Study  Result Date: 02/13/2020 CLINICAL DATA:  Prostate cancer. Pink colored urine. Hypotension. Flank pain. EXAM: CT ABDOMEN AND PELVIS WITHOUT CONTRAST TECHNIQUE: Multidetector CT imaging of the abdomen and pelvis was performed following the standard protocol without IV contrast. COMPARISON:  12/23/2017 CT abdomen/pelvis. FINDINGS: Lower chest: Numerous (greater than 20) solid pulmonary nodules and masses at both lung bases, largest 5.2 cm in the basilar right lower lobe (series 4/image 27) and 5.5 cm in the basilar left lower lobe (series 4/image 27), all new. Atherosclerosis. Hepatobiliary: Multiple (at least 5) hypodense liver masses scattered throughout the liver, largest 3.8 cm in the segment 6 right liver lobe (series 2/image 36) and 2.0 cm in the superior right liver (series 2/image 22), all new. Cholelithiasis. No gallbladder wall thickening or pericholecystic fluid. No biliary ductal dilatation. Pancreas: Normal, with no mass or duct dilation. Spleen: Normal size. No mass. Adrenals/Urinary Tract: Normal right adrenal. Left adrenal 1.5 cm nodule with density 17 HU, stable, most compatible with an adenoma. Moderate bilateral hydroureteronephrosis to the level of the ureterovesical junctions bilaterally. No renal stones. No ureteral stones. Simple 3.7 cm posterior lower left renal cyst. No additional contour deforming renal lesions. Multiple layering stones in distended bladder, largest 19 mm. Chronic mild diffuse bladder wall thickening is unchanged. Foley catheter in place with balloon in the dependent bladder. Stomach/Bowel: Normal non-distended stomach. Normal caliber small bowel with no small bowel wall thickening. Normal  appendix. Normal large bowel with no diverticulosis, large bowel wall thickening or pericolonic fat stranding.  Vascular/Lymphatic: Atherosclerotic nonaneurysmal abdominal aorta. No pathologically enlarged lymph nodes in the abdomen or pelvis. Reproductive: Newly mildly enlarged, irregular and poorly marginated prostate, directly contiguous with the bladder trigone. Fiducial markers noted in the prostate as before. Other: No pneumoperitoneum, ascites or focal fluid collection. Solid subcutaneous 2.9 cm lateral right chest wall mass (series 2/image 29), new. Similar solid subcutaneous 4.4 cm left gluteal mass (series 2/image 63). Small fat containing umbilical hernia is stable. Musculoskeletal: Numerous new sclerotic osseous lesions throughout the visualized skeleton including multiple lower ribs bilaterally, multiple thoracolumbar vertebrae largest at T11 and throughout the bilateral pelvic girdle, most prominent in the left iliac crest (series 2/image 60). Marked lumbar spondylosis. IMPRESSION: 1. Numerous new solid pulmonary nodules and masses at both lung bases, largest 5.2 cm in the basilar right lower lobe, compatible with pulmonary metastases. 2. New widespread sclerotic osseous metastases throughout the visualized skeleton as detailed. 3. Multiple new hypodense liver masses compatible with liver metastases. 4. Newly mildly enlarged, irregular and poorly marginated prostate, directly contiguous with the bladder trigone, suggestive of recurrent locally advanced prostate cancer. 5. Moderate bilateral hydroureteronephrosis to the level of the UVJ bilaterally, probably due to bladder outlet obstruction despite the well-positioned Foley catheter in the bladder. 6. Multiple layering bladder stones in the distended bladder. 7. Cholelithiasis. 8. Stable left adrenal adenoma. 9. Aortic Atherosclerosis (ICD10-I70.0). Electronically Signed   By: Ilona Sorrel M.D.   On: 02/13/2020 10:06   IR NEPHROSTOMY PLACEMENT LEFT  Result Date: 02/23/2020 CLINICAL DATA:  Widely metastatic prostate carcinoma and obstructive uropathy due to  bleeding and clots at the level of the bladder leading to obstruction indwelling Foley catheters. Request has been made to place bilateral percutaneous nephrostomy tubes for urinary drainage. EXAM: 1. ULTRASOUND GUIDANCE FOR PUNCTURE OF THE LEFT RENAL COLLECTING SYSTEM. 2. LEFT PERCUTANEOUS NEPHROSTOMY TUBE PLACEMENT. 3. ULTRASOUND GUIDANCE FOR PUNCTURE OF THE RIGHT RENAL COLLECTING SYSTEM. 4. RIGHT PERCUTANEOUS NEPHROSTOMY TUBE PLACEMENT. COMPARISON:  None. ANESTHESIA/SEDATION: 25 mcg IV Fentanyl. Total Moderate Sedation Time: 20 minutes. The patient's level of consciousness and physiologic status were continuously monitored during the procedure by Radiology nursing. CONTRAST:  10 ml Visipaque 320 MEDICATIONS: 2 g IV Ancef. Antibiotic was administered in an appropriate time frame prior to skin puncture. FLUOROSCOPY TIME:  1 minute and 54 seconds.  24.3 mGy. PROCEDURE: The procedure, risks, benefits, and alternatives were explained to the patient and his wife. Questions regarding the procedure were encouraged and answered. The patient's wife understands and consents to the procedure. A time-out was performed prior to initiating the procedure. Bilateral flank regions were prepped with chlorhexidine in a sterile fashion, and a sterile drape was applied covering the operative field. A sterile gown and sterile gloves were used for the procedure. Local anesthesia was provided with 1% Lidocaine. Ultrasound was used to localize the left kidney. Under direct ultrasound guidance, a 21 gauge needle was advanced into the renal collecting system. Ultrasound image documentation was performed. Aspiration of urine sample was performed followed by contrast injection. A transitional dilator was advanced over a guidewire. Percutaneous tract dilatation was then performed over the guidewire. A 10-French percutaneous nephrostomy tube was then advanced and formed in the collecting system. Catheter position was confirmed by fluoroscopy  after contrast injection. Ultrasound was used to localize the right kidney. Under direct ultrasound guidance, a 21 gauge needle was advanced into the collecting system. Ultrasound image documentation was performed. After  aspiration of urine, contrast was injected. A transitional dilator was advanced. The tract was dilated and a 10 French percutaneous nephrostomy tube advanced over the wire and formed. Catheter positioning was confirmed by fluoroscopy after contrast injection. Both catheters were secured at the skin with Prolene retention sutures and Stat-Lock devices. Both were connected to gravity drainage bags. COMPLICATIONS: None. FINDINGS: Initial ultrasound demonstrates hydronephrosis of both kidneys, left greater than right. The left percutaneous nephrostomy tube was placed via upper pole access with the tube formed at the level of the renal pelvis. There is return of bloody urine after tube placement. The right percutaneous nephrostomy tube was placed via interpolar access and formed at the level of the renal pelvis. There is return of urine containing significant debris. IMPRESSION: Bilateral 10 French percutaneous nephrostomy tube placement utilizing ultrasound and fluoroscopy. Both tubes were formed at the level of the renal pelvis and connected to gravity bag drainage. Electronically Signed   By: Aletta Edouard M.D.   On: 02/23/2020 15:50   IR NEPHROSTOMY PLACEMENT RIGHT  Result Date: 02/23/2020 CLINICAL DATA:  Widely metastatic prostate carcinoma and obstructive uropathy due to bleeding and clots at the level of the bladder leading to obstruction indwelling Foley catheters. Request has been made to place bilateral percutaneous nephrostomy tubes for urinary drainage. EXAM: 1. ULTRASOUND GUIDANCE FOR PUNCTURE OF THE LEFT RENAL COLLECTING SYSTEM. 2. LEFT PERCUTANEOUS NEPHROSTOMY TUBE PLACEMENT. 3. ULTRASOUND GUIDANCE FOR PUNCTURE OF THE RIGHT RENAL COLLECTING SYSTEM. 4. RIGHT PERCUTANEOUS NEPHROSTOMY  TUBE PLACEMENT. COMPARISON:  None. ANESTHESIA/SEDATION: 25 mcg IV Fentanyl. Total Moderate Sedation Time: 20 minutes. The patient's level of consciousness and physiologic status were continuously monitored during the procedure by Radiology nursing. CONTRAST:  10 ml Visipaque 320 MEDICATIONS: 2 g IV Ancef. Antibiotic was administered in an appropriate time frame prior to skin puncture. FLUOROSCOPY TIME:  1 minute and 54 seconds.  24.3 mGy. PROCEDURE: The procedure, risks, benefits, and alternatives were explained to the patient and his wife. Questions regarding the procedure were encouraged and answered. The patient's wife understands and consents to the procedure. A time-out was performed prior to initiating the procedure. Bilateral flank regions were prepped with chlorhexidine in a sterile fashion, and a sterile drape was applied covering the operative field. A sterile gown and sterile gloves were used for the procedure. Local anesthesia was provided with 1% Lidocaine. Ultrasound was used to localize the left kidney. Under direct ultrasound guidance, a 21 gauge needle was advanced into the renal collecting system. Ultrasound image documentation was performed. Aspiration of urine sample was performed followed by contrast injection. A transitional dilator was advanced over a guidewire. Percutaneous tract dilatation was then performed over the guidewire. A 10-French percutaneous nephrostomy tube was then advanced and formed in the collecting system. Catheter position was confirmed by fluoroscopy after contrast injection. Ultrasound was used to localize the right kidney. Under direct ultrasound guidance, a 21 gauge needle was advanced into the collecting system. Ultrasound image documentation was performed. After aspiration of urine, contrast was injected. A transitional dilator was advanced. The tract was dilated and a 10 French percutaneous nephrostomy tube advanced over the wire and formed. Catheter positioning was  confirmed by fluoroscopy after contrast injection. Both catheters were secured at the skin with Prolene retention sutures and Stat-Lock devices. Both were connected to gravity drainage bags. COMPLICATIONS: None. FINDINGS: Initial ultrasound demonstrates hydronephrosis of both kidneys, left greater than right. The left percutaneous nephrostomy tube was placed via upper pole access with the tube formed at the  level of the renal pelvis. There is return of bloody urine after tube placement. The right percutaneous nephrostomy tube was placed via interpolar access and formed at the level of the renal pelvis. There is return of urine containing significant debris. IMPRESSION: Bilateral 10 French percutaneous nephrostomy tube placement utilizing ultrasound and fluoroscopy. Both tubes were formed at the level of the renal pelvis and connected to gravity bag drainage. Electronically Signed   By: Aletta Edouard M.D.   On: 02/23/2020 15:50   US Abdomen Limited RUQ  Result Date: 02/17/2020 CLINICAL DATA:  74 year old with history of prostate cancer. Suspicious liver lesions on noncontrast CT. EXAM: ULTRASOUND ABDOMEN LIMITED RIGHT UPPER QUADRANT COMPARISON:  CT renal stone protocol 02/13/2020 FINDINGS: Gallbladder: Echogenic stone at the base of the gallbladder measuring up to 1.0 cm. Common bile duct: Diameter: 0.5 cm Liver: Liver parenchyma is mildly heterogeneous. There are scattered hypoechoic liver lesions that correspond with the previous CT findings. Round hypoechoic lesion in the right hepatic lobe measures up to 2.5 cm. Round hypoechoic lesion along the inferior margin of the right hepatic lobe measures up to 2.3 cm. Large hypoechoic lesion along the posterior aspect of the liver measures up to 4.2 cm. Portal vein is patent on color Doppler imaging with normal direction of blood flow towards the liver. Other: Partially visualized right kidney demonstrates dilatation of the right renal collecting system and similar  to the recent CT. IMPRESSION: 1. Hypoechoic liver lesions. Findings are suggestive for metastatic disease. Patient is scheduled for ultrasound-guided core biopsy of a hepatic lesion. 2. Cholelithiasis. No evidence for gallbladder inflammation or biliary dilatation. Electronically Signed   By: Markus Daft M.D.   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 CODE STATUS DNR/DNI. Awaiting to be transferred to hospice facility. #Obstructive nephropathy, disposed PCN, draining well. #Respiratory failure, continue oxygen via nasal cannula.  Morphine as needed. Thank you for allowing me to participate in the care of this patient.   Earlie Server, MD, PhD Hematology Oncology [facility] Pager- [phone] 02/24/2020

## 2020-02-24 NOTE — Progress Notes (Addendum)
Urology Inpatient Progress Note  Subjective: Bilateral PCNs placed yesterday.  Both draining clear, yellow urine, L>R. Foley catheter remains in place draining scant dark red urine. Patient somnolent today.  I offered Foley removal this morning and he declined, stating he had adequate pain today and wished to defer this until tomorrow.  Anti-infectives: Anti-infectives (From admission, onward)   Start     Dose/Rate Route Frequency Ordered Stop   02/23/20 1355  ceFAZolin (ANCEF) IVPB 1 g/50 mL premix        over 30 Minutes Intravenous Continuous PRN 02/23/20 1410     02/19/20 2200  cephALEXin (KEFLEX) capsule 500 mg  Status:  Discontinued        500 mg Oral Every 8 hours 02/19/20 1607 02/20/20 1614   02/18/20 0600  cephALEXin (KEFLEX) capsule 500 mg  Status:  Discontinued        500 mg Oral Every 6 hours 02/17/20 1252 02/19/20 1607   02/13/20 1000  ceFEPIme (MAXIPIME) 2 g in sodium chloride 0.9 % 100 mL IVPB  Status:  Discontinued        2 g 200 mL/hr over 30 Minutes Intravenous  Once 02/13/20 0942 02/13/20 0953   02/13/20 1000  cefTRIAXone (ROCEPHIN) 2 g in sodium chloride 0.9 % 100 mL IVPB  Status:  Discontinued        2 g 200 mL/hr over 30 Minutes Intravenous Every 24 hours 02/13/20 0954 02/17/20 1252      Current Facility-Administered Medications  Medication Dose Route Frequency Provider Last Rate Last Admin  . acetaminophen (TYLENOL) tablet 1,000 mg  1,000 mg Oral Q6H PRN Enzo Bi, MD   1,000 mg at 02/22/20 6063  . amiodarone (PACERONE) tablet 200 mg  200 mg Oral BID Adaline Sill, NP   200 mg at 02/23/20 2352  . ceFAZolin (ANCEF) IVPB 1 g/50 mL premix   Intravenous Continuous PRN Aletta Edouard, MD   2 g at 02/23/20 1355  . Chlorhexidine Gluconate Cloth 2 % PADS 6 each  6 each Topical Daily Ivor Costa, MD   6 each at 02/23/20 [phone]  . fentaNYL (SUBLIMAZE) injection   Intravenous PRN Aletta Edouard, MD   25 mcg at 02/23/20 1426  . hydrALAZINE (APRESOLINE) injection 5 mg  5 mg  Intravenous Q2H PRN Ivor Costa, MD      . levETIRAcetam (KEPPRA) tablet 500 mg  500 mg Oral BID Ivor Costa, MD   500 mg at 02/23/20 2352  . LORazepam (ATIVAN) injection 0.5 mg  0.5 mg Intravenous Q12H PRN Ivor Costa, MD   0.5 mg at 02/22/20 0057  . LORazepam (ATIVAN) injection 0.5-1 mg  0.5-1 mg Intravenous Q2H PRN Ivor Costa, MD      . magic mouthwash w/lidocaine  15 mL Oral TID Lorella Nimrod, MD   15 mL at 02/23/20 2351  . metoprolol tartrate (LOPRESSOR) tablet 50 mg  50 mg Oral BID Ivor Costa, MD   50 mg at 02/23/20 2352  . morphine 2 MG/ML injection 1 mg  1 mg Intravenous Q2H PRN Enzo Bi, MD   1 mg at 02/22/20 1543  . morphine CONCENTRATE 10 MG/0.5ML oral solution 5 mg  5 mg Oral Q2H PRN Enzo Bi, MD   5 mg at 02/22/20 1308   Or  . morphine CONCENTRATE 10 MG/0.5ML oral solution 5 mg  5 mg Sublingual Q2H PRN Enzo Bi, MD      . ondansetron [facility]) injection 4 mg  4 mg Intravenous Q6H PRN Enzo Bi, MD  4 mg at 02/22/20 1543  . ondansetron (ZOFRAN-ODT) disintegrating tablet 4 mg  4 mg Oral Q8H PRN Enzo Bi, MD      . senna-docusate (Senokot-S) tablet 2 tablet  2 tablet Oral QHS Enzo Bi, MD   2 tablet at 02/18/20 2129  . simethicone (MYLICON) chewable tablet 80 mg  80 mg Oral QID PRN Enzo Bi, MD   80 mg at 02/18/20 1329  . sodium chloride flush (NS) 0.9 % injection 3 mL  3 mL Intravenous PRN Lorella Nimrod, MD   3 mL at 02/14/20 2314  . sodium chloride flush (NS) 0.9 % injection 5 mL  5 mL Intracatheter Q8H Aletta Edouard, MD   5 mL at 02/24/20 0447   Objective: Vital signs in last 24 hours: Temp:  [97 F (36.1 C)-98.8 F (37.1 C)] 98.8 F (37.1 C) (10/22 0700) Pulse Rate:  [71-93] 73 (10/22 0700) Resp:  [16-34] 25 (10/22 0700) BP: (108-144)/(41-97) 112/61 (10/22 0700) SpO2:  [88 %-98 %] 93 % (10/22 0700)  Intake/Output from previous day: 10/21 0701 - 10/22 0700 In: 410 [P.O.:360; IV Piggyback:50] Out: 2250 [Urine:2250] Intake/Output this shift: No intake/output data  recorded.  Physical Exam Constitutional:      General: He is not in acute distress.    Appearance: He is ill-appearing. He is not toxic-appearing or diaphoretic.  HENT:     Head: Normocephalic and atraumatic.  Pulmonary:     Effort: Pulmonary effort is normal. No respiratory distress.  Skin:    General: Skin is warm and dry.  Psychiatric:        Mood and Affect: Mood normal.        Behavior: Behavior normal.    Assessment & Plan: 74 year old male with metastatic prostate cancer, frozen pelvis, and ongoing clot retention with AKI, now s/p bilateral PCN placement.  PCNs in place and draining appropriately this morning.  Foley catheter draining minimally.  At this point, I recommend Foley catheter removal.  Patient is refusing this this morning and states he would prefer to defer until tomorrow.  I am in agreement with this plan.  Recommendations: -D/C Foley tomorrow  Debroah Loop, PA-C 02/24/2020

## 2020-03-01 LAB — SURGICAL PATHOLOGY

## 2020-03-05 DEATH — deceased

## 2021-02-25 ENCOUNTER — Encounter: Payer: Self-pay | Admitting: Oncology

## 2021-04-09 ENCOUNTER — Encounter: Payer: Self-pay | Admitting: Oncology

## 2021-04-16 IMAGING — CT CT ANGIO CHEST
2 of 6 series · 17 of 46 positions shown · IV contrast (APPLIED)
Comparison: None.

CLINICAL DATA: Atrial fibrillation, elevated D-dimer, prostate
cancer

EXAM:
CT ANGIOGRAPHY CHEST WITH CONTRAST
TECHNIQUE: Multidetector CT imaging of the chest was performed using the
standard protocol during bolus administration of intravenous
contrast. Multiplanar CT image reconstructions and MIPs were
obtained to evaluate the vascular anatomy.
CONTRAST:  60mL OMNIPAQUE IOHEXOL 350 MG/ML SOLN

[Series 5: thins · axial · 0.66mm/px · z∈[-697,-450]mm · 14 of 271 slices shown]
[im 12/271  lung]
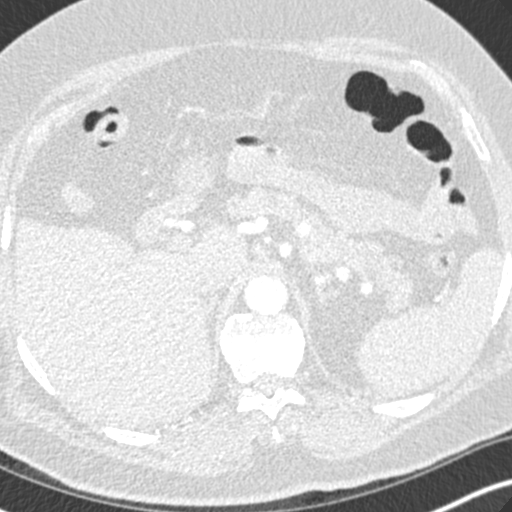
[im 36/271  soft-tissue]
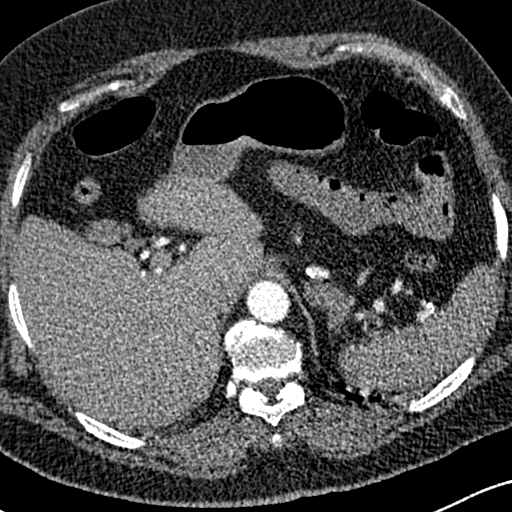
[im 47/271  lung]
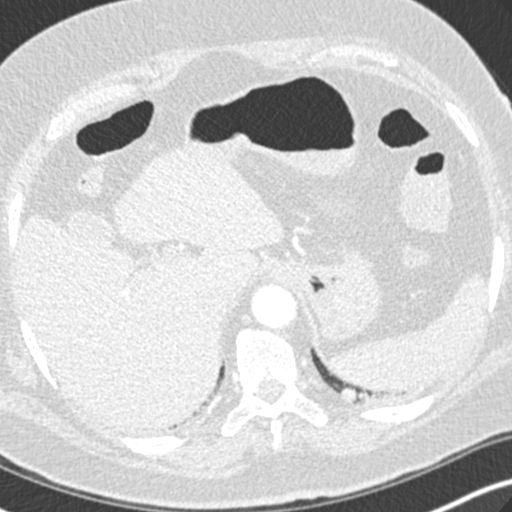
[im 71/271  soft-tissue]
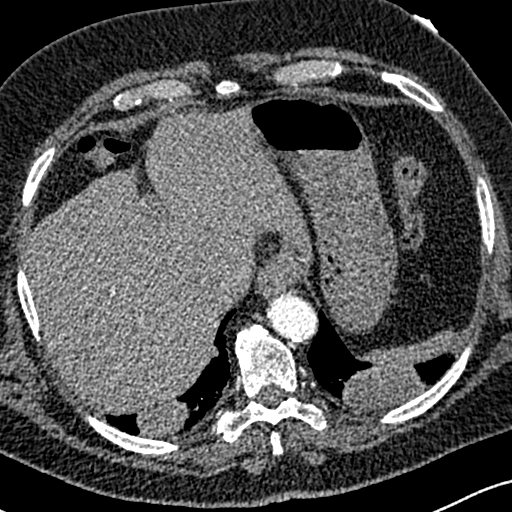
[im 94/271  lung]
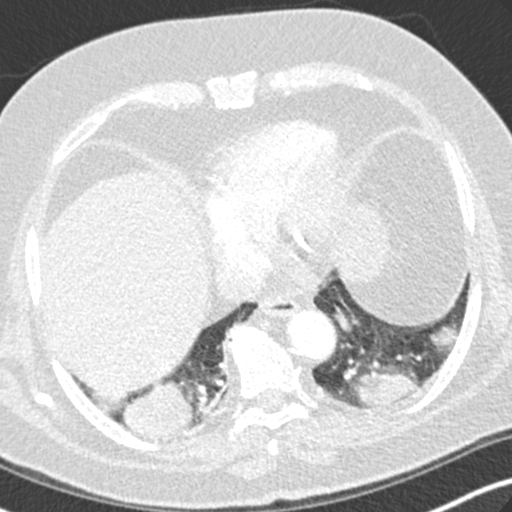
[im 106/271  soft-tissue]
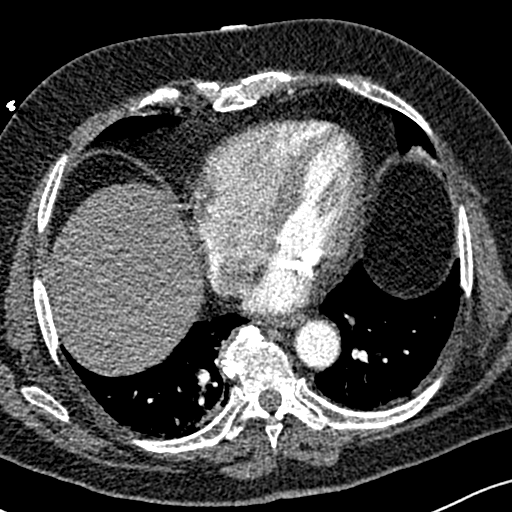
[im 130/271  lung]
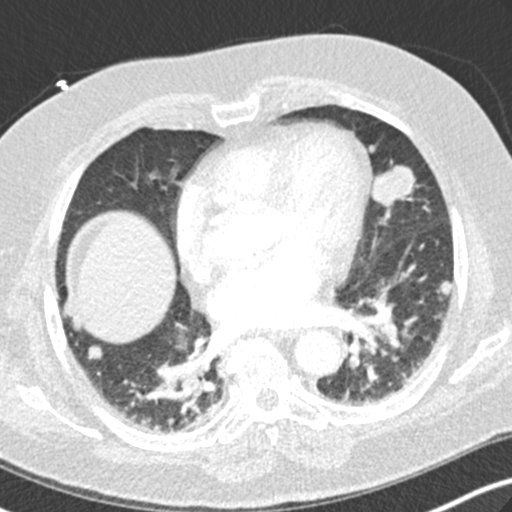
[im 141/271  soft-tissue]
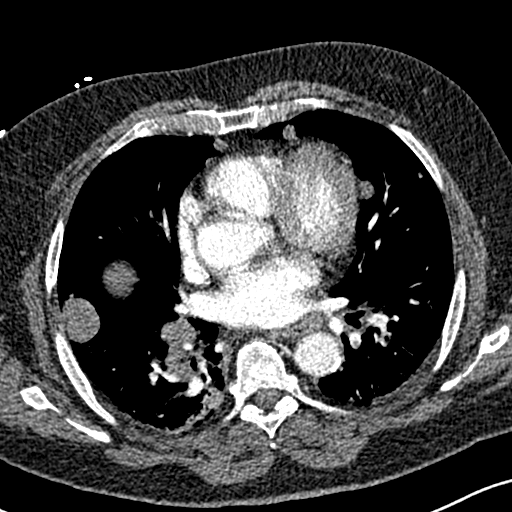
[im 165/271  lung]
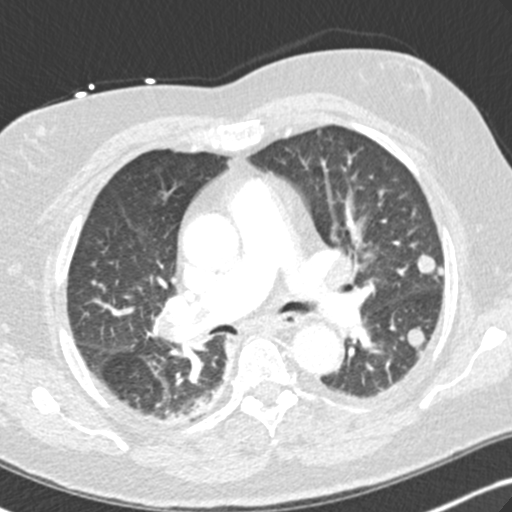
[im 177/271  soft-tissue]
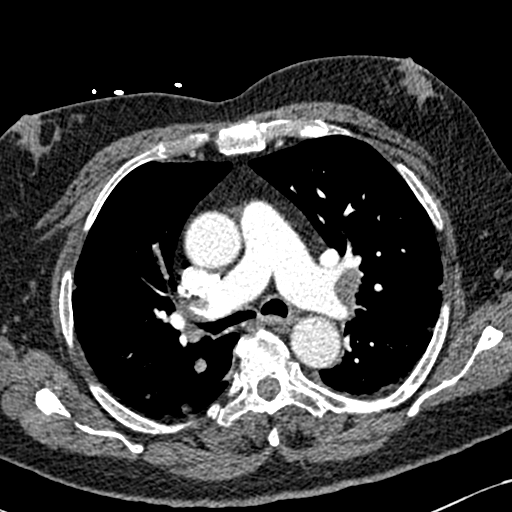
[im 200/271  lung]
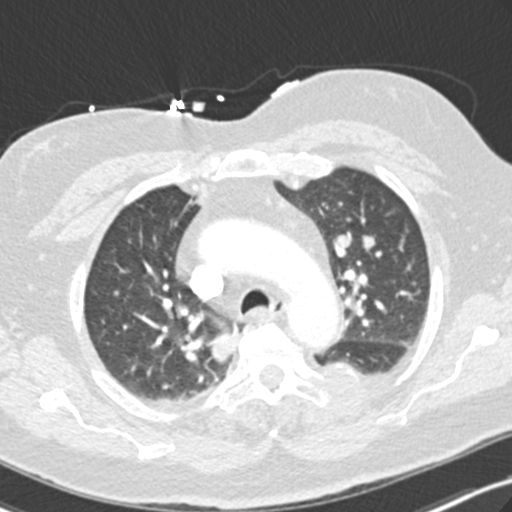
[im 224/271  soft-tissue]
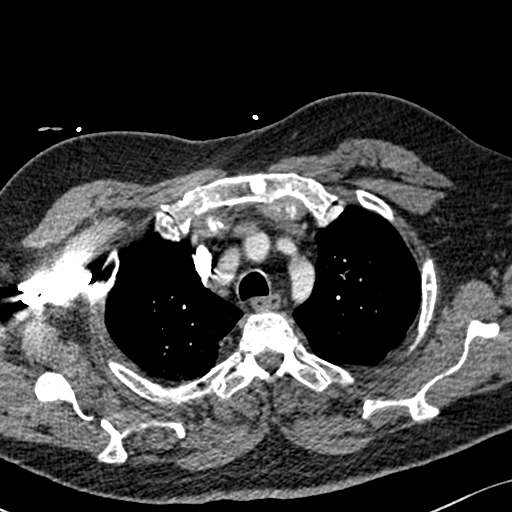
[im 235/271  lung]
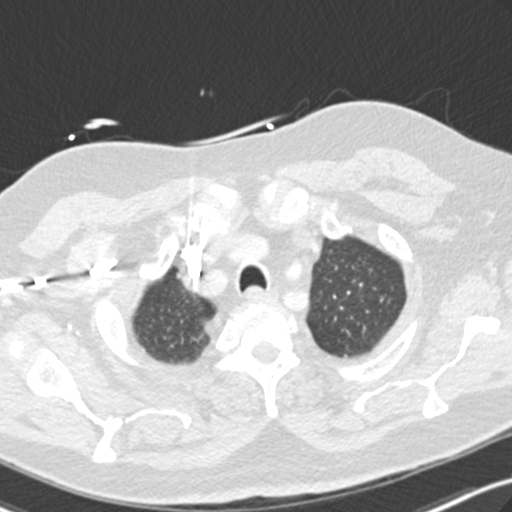
[im 259/271  soft-tissue]
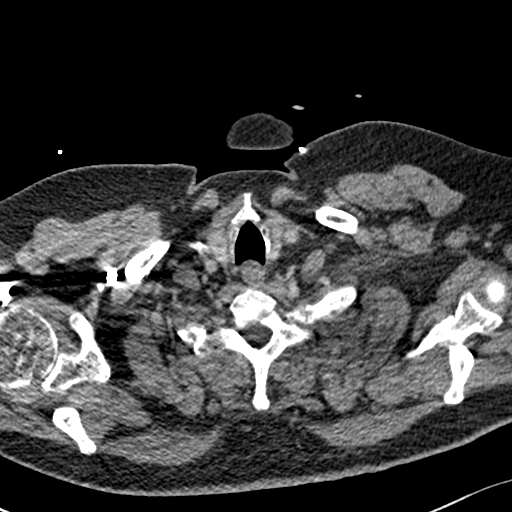

[Series 7: coronal mpr · coronal · 0.56mm/px · 3 of 100 slices shown]
[im 25/100  soft-tissue]
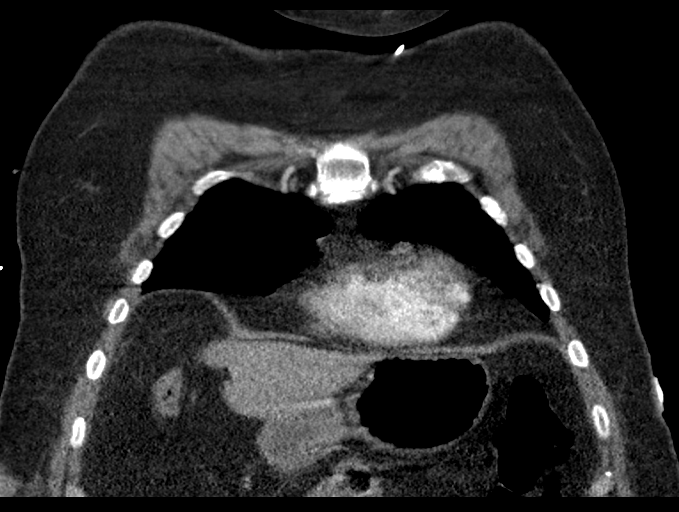
[im 50/100  soft-tissue]
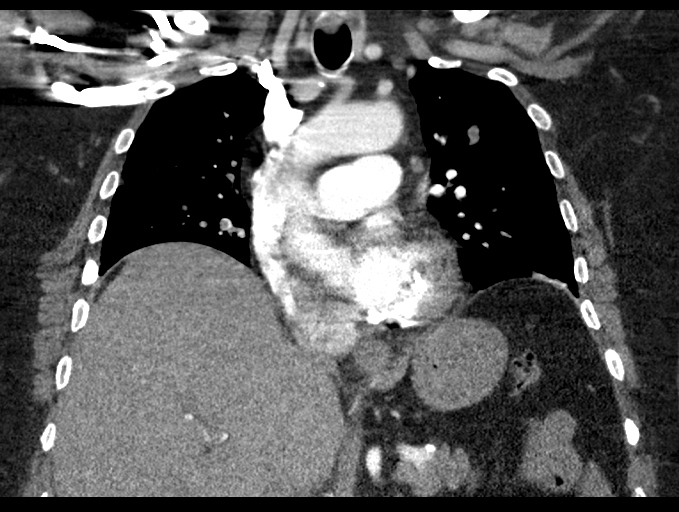
[im 75/100  soft-tissue]
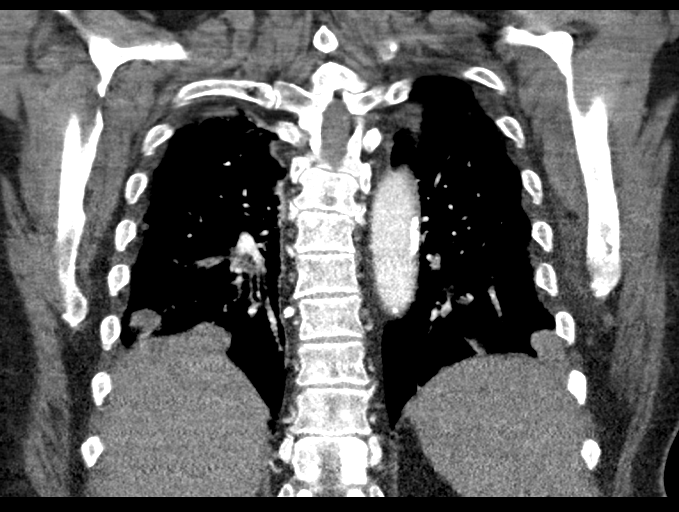

[17 of 46 positions shown; findings below may reference images not displayed]

FINDINGS: Cardiovascular: There is adequate opacification of the pulmonary
arterial tree. There are multiple branching central intraluminal
filling defects identified within the right upper and right lower
lobar pulmonary arteries as well as multiple segmental pulmonary
arteries of the left lower lobe in keeping with acute pulmonary
embolism. The central pulmonary arteries are enlarged in keeping
with changes of pulmonary arterial hypertension. However, there is
no CT evidence of right heart strain with the right to left
ventricular ratio within normal limits.

Moderate coronary artery calcification. Cardiac size is mildly
enlarged. Moderate calcification of the mitral valve annulus. No
pericardial effusion. The thoracic aorta is of normal caliber. Mild
atherosclerotic calcification is seen within the aortic arch and
descending thoracic aorta.

Mediastinum/Nodes: There is pathologic bilateral hilar adenopathy.
Index lymph node within the right hilum measures 16 mm x 25 mm at
axial image # 42/4. Thyroid unremarkable. Esophagus unremarkable.

Lungs/Pleura: Multiple bilateral pulmonary masses are identified
most in keeping with pulmonary metastatic disease in this patient
with a known history of prostate cancer. Index lesion within the
right lower lobe measures 3.5 x 5.2 cm at axial image # 62/6. At
least 50 nodules are seen scattered throughout the lungs
bilaterally. No pneumothorax or pleural effusion. The central
airways are widely patent.

Upper Abdomen: Unremarkable

Musculoskeletal: Multiple sclerotic metastases are identified within
the visualized axial skeleton involving the scapula bilaterally, the
sternum, the thoracic spine, and multiple ribs bilaterally. No
pathologic fracture.

Review of the MIP images confirms the above findings.
IMPRESSION: Acute pulmonary embolism. Moderate embolic burden. No CT evidence of
right heart strain.

Moderate coronary artery calcification.  Mild global cardiomegaly.

Innumerable bilateral pulmonary masses and nodules as well as
bilateral pathologic hilar adenopathy and widespread sclerotic
osseous metastases in keeping with visceral and osseous metastatic
disease related to the patient's underlying prostate cancer.

Aortic Atherosclerosis ([id number]).

## 2021-04-22 IMAGING — US US RENAL
1 series · 14 of 25 positions shown · non-contrast
Comparison: CT from 02/13/2020

CLINICAL DATA: Acute renal injury

EXAM:
RENAL / URINARY TRACT ULTRASOUND COMPLETE

[Series 1: us renal · 54 acquisitions, 14 frames shown]
[im 1/54]
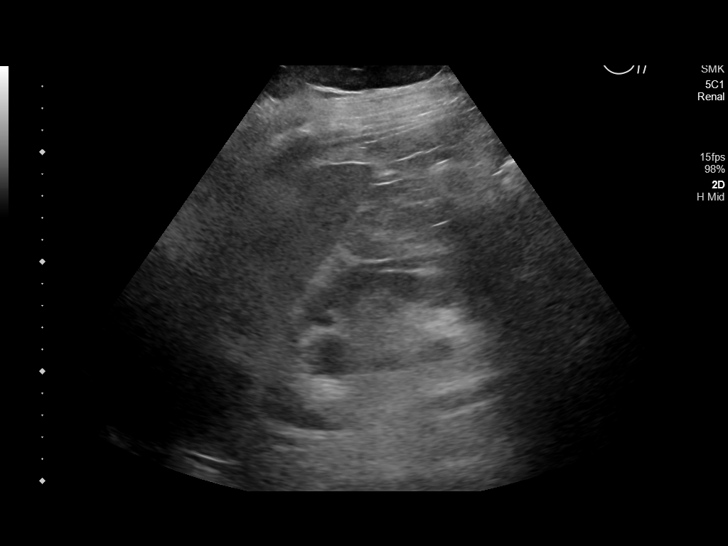
[im 5/54]
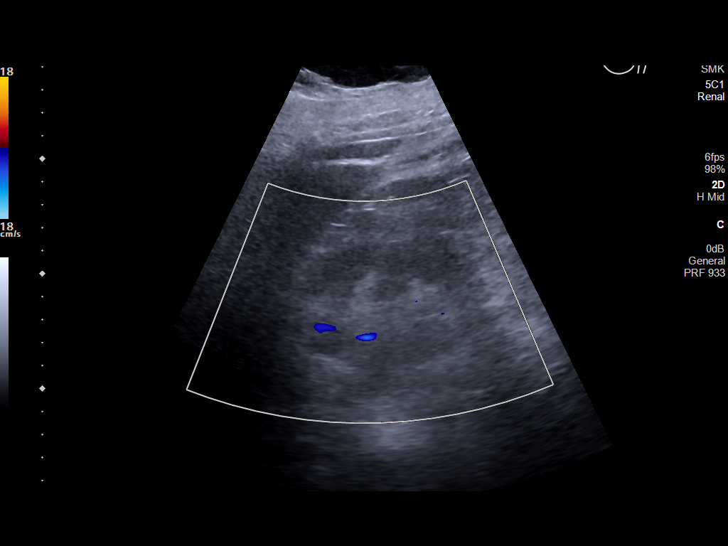
[im 9/54]
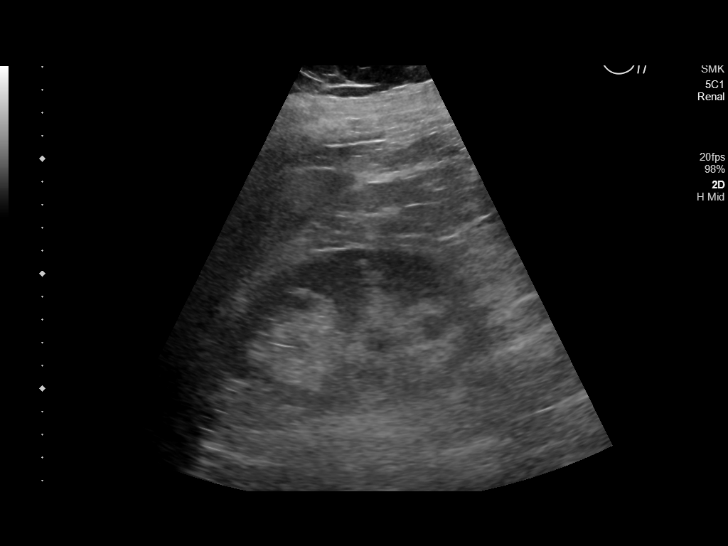
[im 14/54]
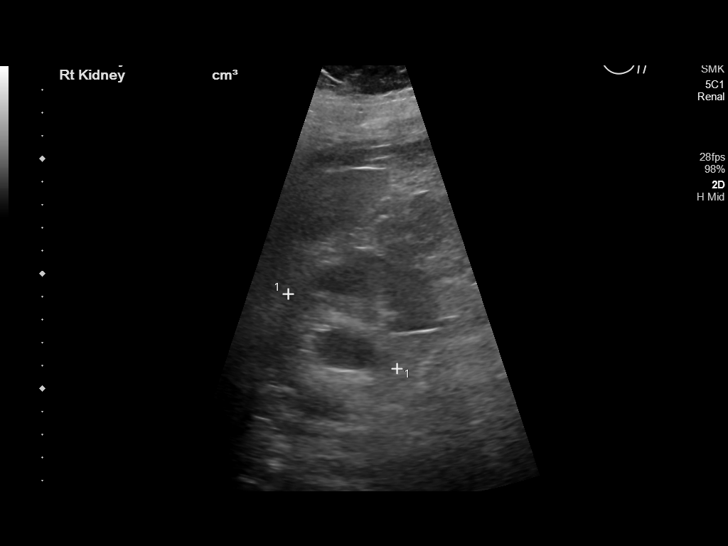
[im 18/54]
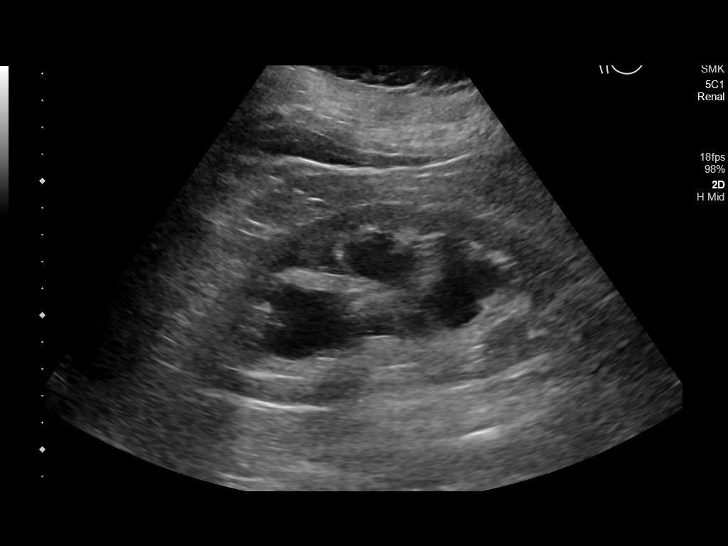
[im 20/54]
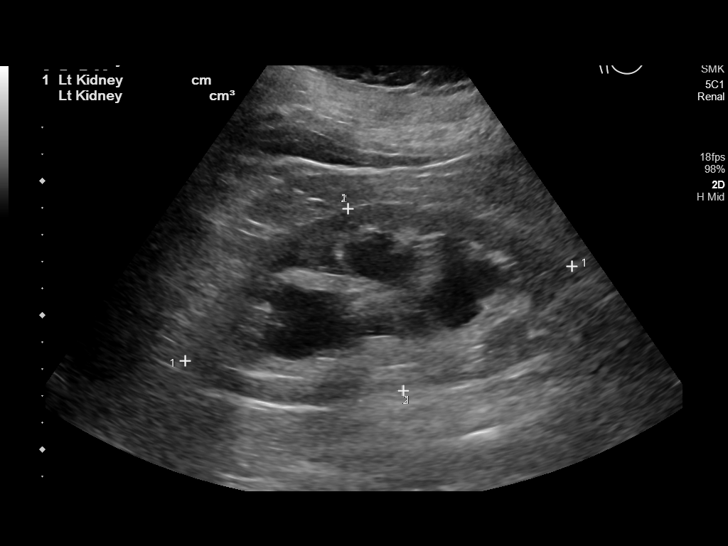
[im 25/54]
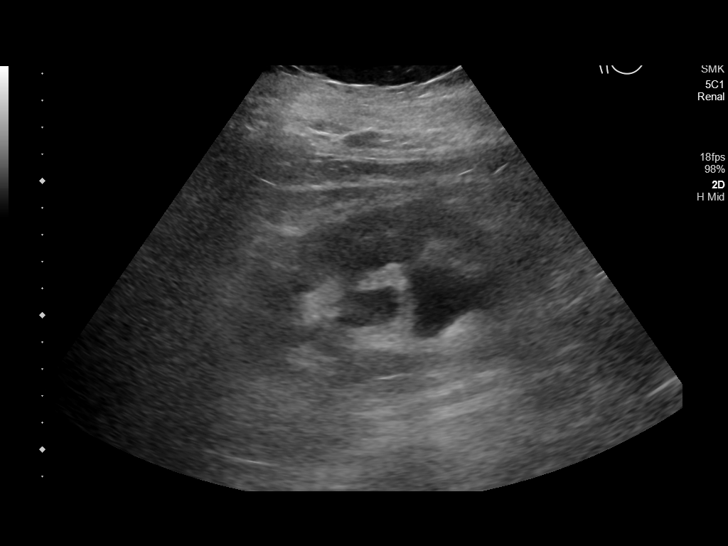
[im 29/54]
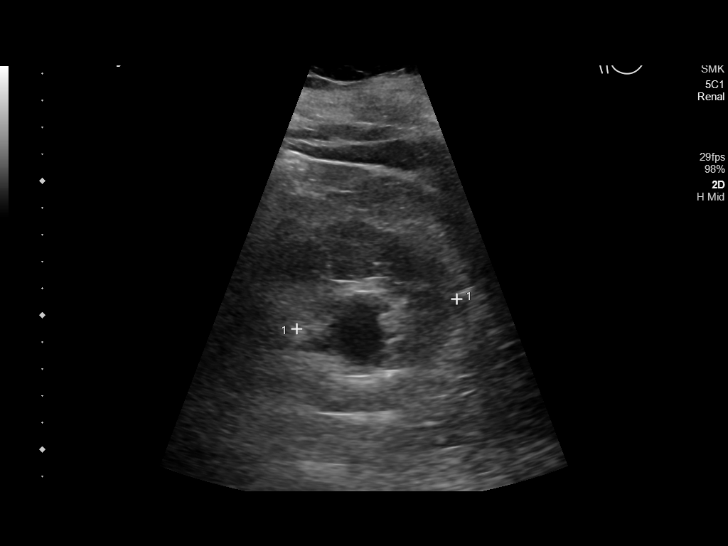
[im 34/54]
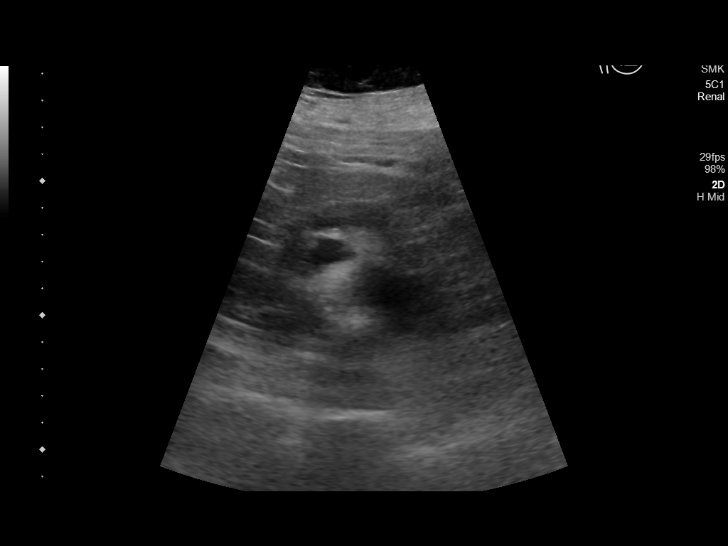
[im 36/54]
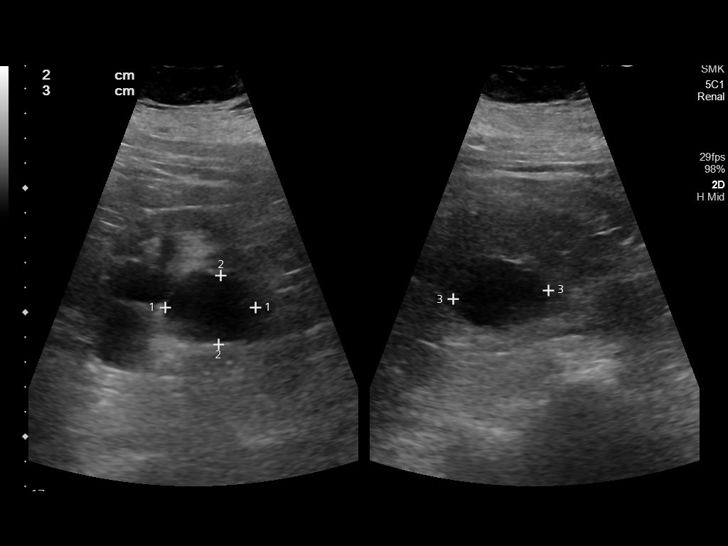
[im 40/54]
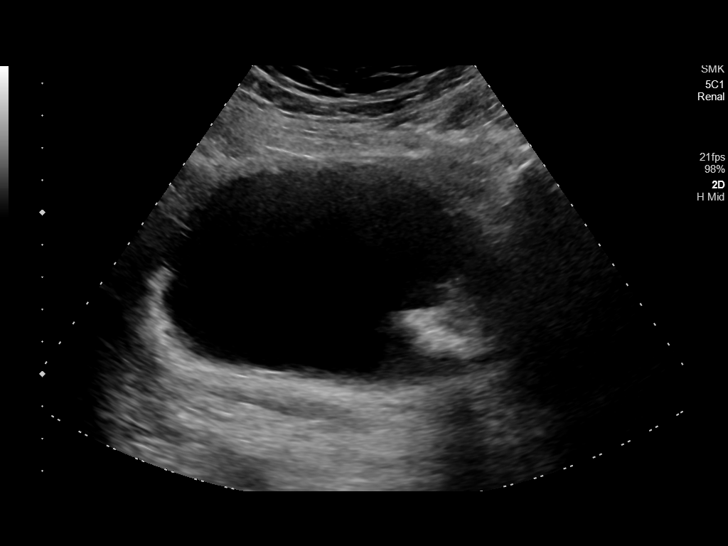
[im 45/54]
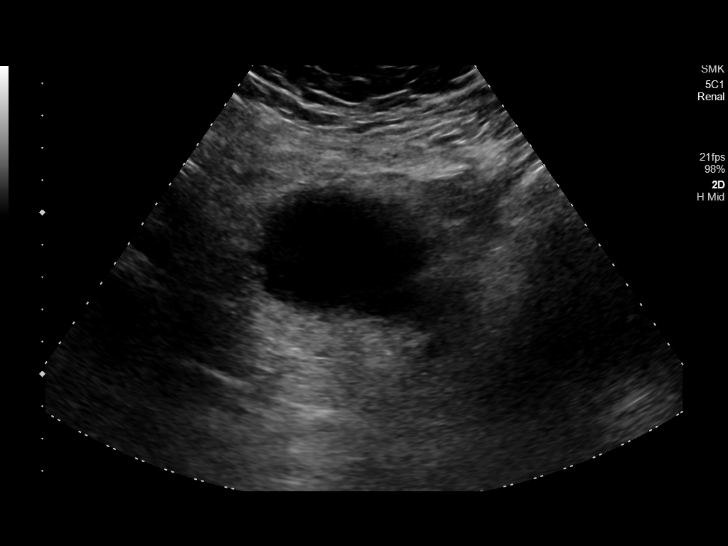
[im 49/54]
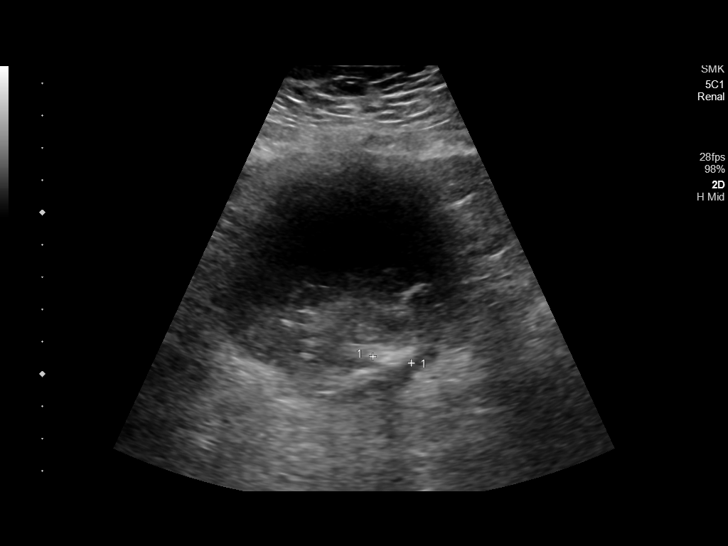
[im 54/54]
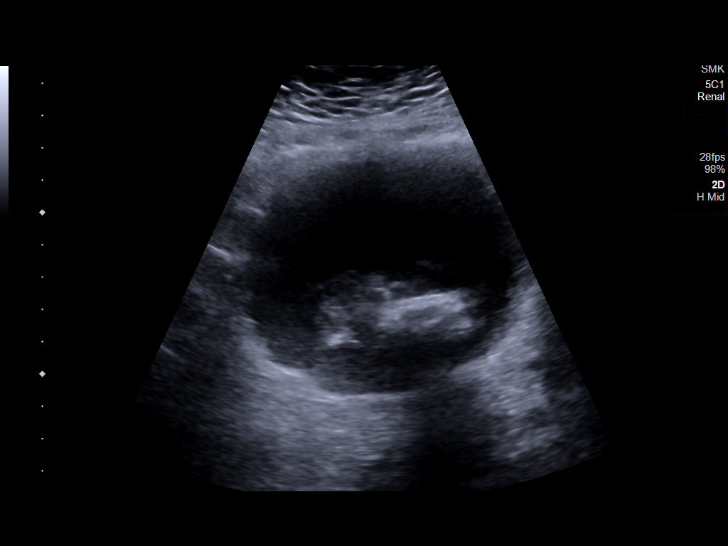

[14 of 25 positions shown; findings below may reference images not displayed]

FINDINGS: Right Kidney:

Renal measurements: 13.0 x 6.8 x 5.7 cm. = volume: 266 mL. Mild to
moderate hydronephrosis is noted.

Left Kidney:

Renal measurements: 14.8 x 7.1 x 6.1 cm. = volume: 333 mL. Moderate
to severe hydronephrosis is noted on the left. Additionally there is
a 3.9 cm cystic lesion in the lower pole which appears simple in
nature.

Bladder:

Decompressed by Foley catheter. Multiple bladder calculi are again
identified and stable.

Other:

None.
IMPRESSION: Stable appearing hydronephrosis bilaterally worse on the left than
the right this is stable from prior CT examination.

Left renal cyst.

Multiple bladder calculi.

## 2021-04-23 IMAGING — DX DG CHEST 1V PORT
1 series · 1 of 1 positions shown · non-contrast
Comparison: 02/18/2020, CT 02/13/2020

CLINICAL DATA: Dyspnea

EXAM:
PORTABLE CHEST 1 VIEW

[chest ap]
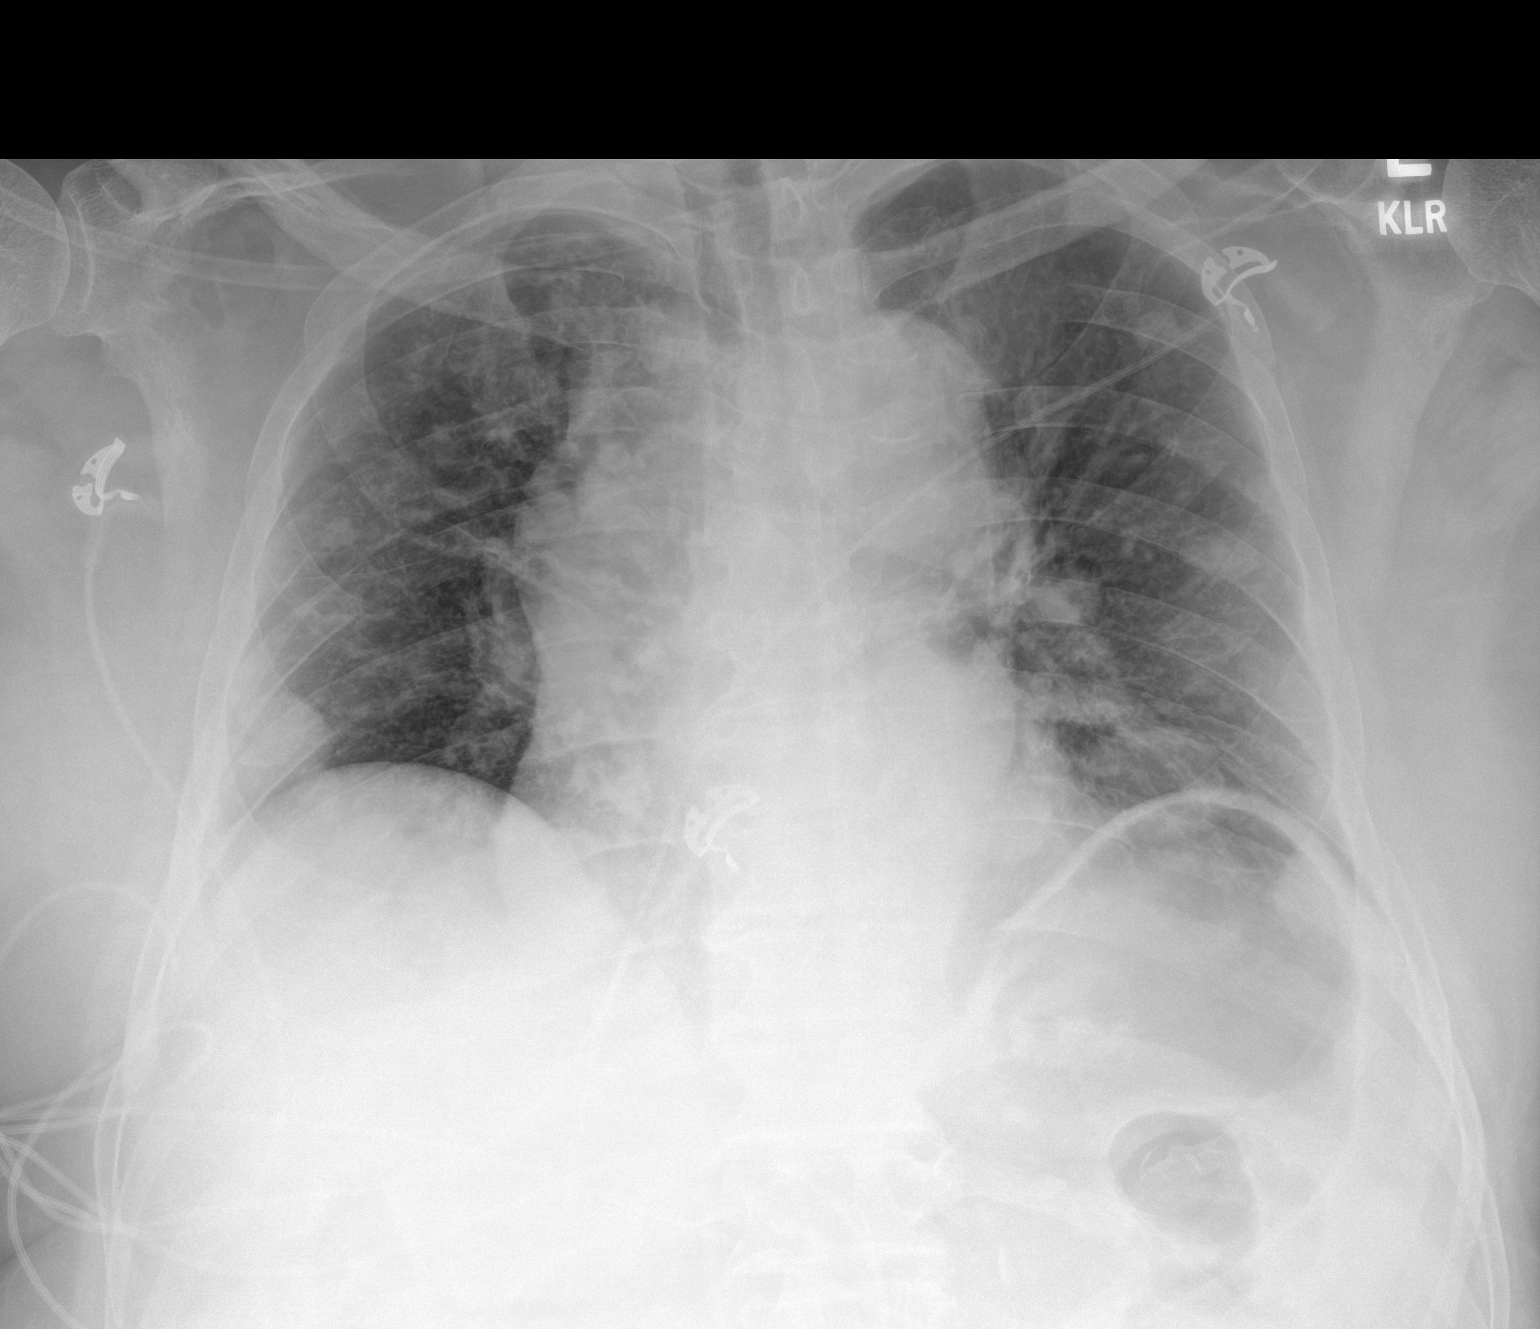

[1 of 1 positions shown; findings below may reference images not displayed]

FINDINGS: Multiple bilateral lung nodules. No acute consolidation or effusion.
Enlarged cardiomediastinal silhouette. Bilateral hilar enlargement
likely due to nodes. No pneumothorax.
IMPRESSION: 1. No acute airspace disease.
2. Bilateral pulmonary nodules/masses and hilar enlargement,
consistent with metastatic disease.
